# Patient Record
Sex: Male | Born: 1935
Health system: Southern US, Community
[De-identification: ages and names within clinical notes are randomized; demographics above are authoritative.]

## PROBLEM LIST (undated history)

## (undated) DIAGNOSIS — I779 Disorder of arteries and arterioles, unspecified: Secondary | ICD-10-CM

## (undated) DIAGNOSIS — I5042 Chronic combined systolic (congestive) and diastolic (congestive) heart failure: Secondary | ICD-10-CM

## (undated) DIAGNOSIS — I351 Nonrheumatic aortic (valve) insufficiency: Secondary | ICD-10-CM

## (undated) DIAGNOSIS — I499 Cardiac arrhythmia, unspecified: Secondary | ICD-10-CM

## (undated) DIAGNOSIS — Z87442 Personal history of urinary calculi: Secondary | ICD-10-CM

## (undated) DIAGNOSIS — N183 Chronic kidney disease, stage 3 unspecified: Secondary | ICD-10-CM

## (undated) DIAGNOSIS — I209 Angina pectoris, unspecified: Secondary | ICD-10-CM

## (undated) DIAGNOSIS — I10 Essential (primary) hypertension: Secondary | ICD-10-CM

## (undated) DIAGNOSIS — M1990 Unspecified osteoarthritis, unspecified site: Secondary | ICD-10-CM

## (undated) DIAGNOSIS — I2510 Atherosclerotic heart disease of native coronary artery without angina pectoris: Secondary | ICD-10-CM

## (undated) DIAGNOSIS — E785 Hyperlipidemia, unspecified: Secondary | ICD-10-CM

## (undated) DIAGNOSIS — G459 Transient cerebral ischemic attack, unspecified: Secondary | ICD-10-CM

## (undated) DIAGNOSIS — I739 Peripheral vascular disease, unspecified: Secondary | ICD-10-CM

## (undated) DIAGNOSIS — I4729 Other ventricular tachycardia: Secondary | ICD-10-CM

## (undated) DIAGNOSIS — N200 Calculus of kidney: Secondary | ICD-10-CM

## (undated) DIAGNOSIS — I428 Other cardiomyopathies: Secondary | ICD-10-CM

## (undated) DIAGNOSIS — H9190 Unspecified hearing loss, unspecified ear: Secondary | ICD-10-CM

## (undated) DIAGNOSIS — A4151 Sepsis due to Escherichia coli [E. coli]: Secondary | ICD-10-CM

## (undated) DIAGNOSIS — I69359 Hemiplegia and hemiparesis following cerebral infarction affecting unspecified side: Secondary | ICD-10-CM

## (undated) DIAGNOSIS — I671 Cerebral aneurysm, nonruptured: Secondary | ICD-10-CM

## (undated) DIAGNOSIS — R55 Syncope and collapse: Secondary | ICD-10-CM

## (undated) DIAGNOSIS — I679 Cerebrovascular disease, unspecified: Secondary | ICD-10-CM

## (undated) DIAGNOSIS — I480 Paroxysmal atrial fibrillation: Secondary | ICD-10-CM

## (undated) DIAGNOSIS — C61 Malignant neoplasm of prostate: Secondary | ICD-10-CM

## (undated) DIAGNOSIS — I69398 Other sequelae of cerebral infarction: Secondary | ICD-10-CM

## (undated) DIAGNOSIS — C801 Malignant (primary) neoplasm, unspecified: Secondary | ICD-10-CM

## (undated) DIAGNOSIS — K449 Diaphragmatic hernia without obstruction or gangrene: Secondary | ICD-10-CM

## (undated) DIAGNOSIS — J189 Pneumonia, unspecified organism: Secondary | ICD-10-CM

## (undated) DIAGNOSIS — G450 Vertebro-basilar artery syndrome: Secondary | ICD-10-CM

## (undated) DIAGNOSIS — H539 Unspecified visual disturbance: Secondary | ICD-10-CM

## (undated) DIAGNOSIS — I472 Ventricular tachycardia: Secondary | ICD-10-CM

## (undated) HISTORY — DX: Syncope and collapse: R55

## (undated) HISTORY — DX: Vertebro-basilar artery syndrome: G45.0

## (undated) HISTORY — DX: Malignant (primary) neoplasm, unspecified: C80.1

## (undated) HISTORY — DX: Unspecified hearing loss, unspecified ear: H91.90

## (undated) HISTORY — DX: Chronic combined systolic (congestive) and diastolic (congestive) heart failure: I50.42

## (undated) HISTORY — DX: Atherosclerotic heart disease of native coronary artery without angina pectoris: I25.10

## (undated) HISTORY — DX: Disorder of arteries and arterioles, unspecified: I77.9

## (undated) HISTORY — DX: Chronic kidney disease, stage 3 (moderate): N18.3

## (undated) HISTORY — DX: Cerebral aneurysm, nonruptured: I67.1

## (undated) HISTORY — PX: PROSTATE SURGERY: SHX751

## (undated) HISTORY — DX: Nonrheumatic aortic (valve) insufficiency: I35.1

## (undated) HISTORY — DX: Hyperlipidemia, unspecified: E78.5

## (undated) HISTORY — PX: CATARACT EXTRACTION: SUR2

## (undated) HISTORY — DX: Unspecified visual disturbance: H53.9

## (undated) HISTORY — DX: Other cardiomyopathies: I42.8

## (undated) HISTORY — DX: Peripheral vascular disease, unspecified: I73.9

## (undated) HISTORY — DX: Malignant neoplasm of prostate: C61

## (undated) HISTORY — DX: Chronic kidney disease, stage 3 unspecified: N18.30

## (undated) HISTORY — DX: Unspecified osteoarthritis, unspecified site: M19.90

## (undated) HISTORY — PX: TONSILLECTOMY: SUR1361

## (undated) HISTORY — DX: Cerebrovascular disease, unspecified: I67.9

## (undated) HISTORY — DX: Other ventricular tachycardia: I47.29

## (undated) HISTORY — DX: Other sequelae of cerebral infarction: I69.398

## (undated) HISTORY — DX: Hemiplegia and hemiparesis following cerebral infarction affecting unspecified side: I69.359

## (undated) HISTORY — DX: Paroxysmal atrial fibrillation: I48.0

## (undated) HISTORY — DX: Essential (primary) hypertension: I10

## (undated) HISTORY — PX: OTHER SURGICAL HISTORY: SHX169

## (undated) HISTORY — DX: Ventricular tachycardia: I47.2

---

## 1998-03-31 ENCOUNTER — Other Ambulatory Visit: Admission: RE | Admit: 1998-03-31 | Discharge: 1998-03-31 | Payer: Self-pay | Admitting: Urology

## 1998-04-24 ENCOUNTER — Encounter: Admission: RE | Admit: 1998-04-24 | Discharge: 1998-07-23 | Payer: Self-pay | Admitting: Radiation Oncology

## 1998-06-12 ENCOUNTER — Ambulatory Visit (HOSPITAL_BASED_OUTPATIENT_CLINIC_OR_DEPARTMENT_OTHER): Admission: RE | Admit: 1998-06-12 | Discharge: 1998-06-12 | Payer: Self-pay | Admitting: Urology

## 1998-06-12 ENCOUNTER — Encounter: Payer: Self-pay | Admitting: Urology

## 1998-07-03 ENCOUNTER — Encounter: Payer: Self-pay | Admitting: Radiation Oncology

## 1998-07-24 ENCOUNTER — Encounter: Admission: RE | Admit: 1998-07-24 | Discharge: 1998-10-22 | Payer: Self-pay | Admitting: Radiation Oncology

## 2002-02-19 ENCOUNTER — Encounter: Admission: RE | Admit: 2002-02-19 | Discharge: 2002-02-19 | Payer: Self-pay | Admitting: Urology

## 2002-02-19 ENCOUNTER — Encounter: Payer: Self-pay | Admitting: Urology

## 2002-03-04 ENCOUNTER — Encounter: Payer: Self-pay | Admitting: Urology

## 2002-03-04 ENCOUNTER — Encounter: Admission: RE | Admit: 2002-03-04 | Discharge: 2002-03-04 | Payer: Self-pay | Admitting: Urology

## 2003-02-25 ENCOUNTER — Ambulatory Visit (HOSPITAL_BASED_OUTPATIENT_CLINIC_OR_DEPARTMENT_OTHER): Admission: RE | Admit: 2003-02-25 | Discharge: 2003-02-25 | Payer: Self-pay | Admitting: Urology

## 2003-02-25 ENCOUNTER — Ambulatory Visit (HOSPITAL_COMMUNITY): Admission: RE | Admit: 2003-02-25 | Discharge: 2003-02-25 | Payer: Self-pay | Admitting: Urology

## 2003-11-04 ENCOUNTER — Emergency Department (HOSPITAL_COMMUNITY): Admission: EM | Admit: 2003-11-04 | Discharge: 2003-11-04 | Payer: Self-pay | Admitting: Emergency Medicine

## 2008-12-19 ENCOUNTER — Encounter (INDEPENDENT_AMBULATORY_CARE_PROVIDER_SITE_OTHER): Payer: Self-pay | Admitting: Cardiology

## 2008-12-19 ENCOUNTER — Ambulatory Visit (HOSPITAL_COMMUNITY): Admission: RE | Admit: 2008-12-19 | Discharge: 2008-12-19 | Payer: Self-pay | Admitting: Cardiology

## 2009-07-26 ENCOUNTER — Emergency Department (HOSPITAL_COMMUNITY): Admission: EM | Admit: 2009-07-26 | Discharge: 2009-07-26 | Payer: Self-pay | Admitting: Emergency Medicine

## 2010-07-07 LAB — URINALYSIS, ROUTINE W REFLEX MICROSCOPIC
Bilirubin Urine: NEGATIVE
Glucose, UA: NEGATIVE mg/dL
Protein, ur: NEGATIVE mg/dL
Urobilinogen, UA: 0.2 mg/dL (ref 0.0–1.0)
pH: 5.5 (ref 5.0–8.0)

## 2010-07-07 LAB — URINE MICROSCOPIC-ADD ON

## 2010-07-07 LAB — POCT I-STAT, CHEM 8
BUN: 26 mg/dL — ABNORMAL HIGH (ref 6–23)
Chloride: 107 mEq/L (ref 96–112)
HCT: 38 % — ABNORMAL LOW (ref 39.0–52.0)
Hemoglobin: 12.9 g/dL — ABNORMAL LOW (ref 13.0–17.0)
Sodium: 137 mEq/L (ref 135–145)
TCO2: 23 mmol/L (ref 0–100)

## 2010-08-02 ENCOUNTER — Inpatient Hospital Stay (HOSPITAL_COMMUNITY)
Admission: EM | Admit: 2010-08-02 | Discharge: 2010-08-08 | DRG: 291 | Disposition: A | Discharge status: Home / Self Care | Payer: Medicare Other | Attending: Internal Medicine | Admitting: Internal Medicine

## 2010-08-02 DIAGNOSIS — N201 Calculus of ureter: Secondary | ICD-10-CM | POA: Diagnosis present

## 2010-08-02 DIAGNOSIS — N179 Acute kidney failure, unspecified: Secondary | ICD-10-CM | POA: Diagnosis present

## 2010-08-02 DIAGNOSIS — I5023 Acute on chronic systolic (congestive) heart failure: Principal | ICD-10-CM | POA: Diagnosis present

## 2010-08-02 DIAGNOSIS — K5900 Constipation, unspecified: Secondary | ICD-10-CM | POA: Diagnosis present

## 2010-08-02 DIAGNOSIS — N210 Calculus in bladder: Secondary | ICD-10-CM | POA: Diagnosis present

## 2010-08-02 DIAGNOSIS — N1330 Unspecified hydronephrosis: Secondary | ICD-10-CM | POA: Diagnosis present

## 2010-08-02 DIAGNOSIS — N200 Calculus of kidney: Secondary | ICD-10-CM | POA: Diagnosis present

## 2010-08-02 DIAGNOSIS — I509 Heart failure, unspecified: Secondary | ICD-10-CM | POA: Diagnosis present

## 2010-08-02 DIAGNOSIS — J189 Pneumonia, unspecified organism: Secondary | ICD-10-CM | POA: Diagnosis not present

## 2010-08-02 DIAGNOSIS — Z8546 Personal history of malignant neoplasm of prostate: Secondary | ICD-10-CM

## 2010-08-02 DIAGNOSIS — D473 Essential (hemorrhagic) thrombocythemia: Secondary | ICD-10-CM | POA: Diagnosis present

## 2010-08-03 ENCOUNTER — Inpatient Hospital Stay (HOSPITAL_COMMUNITY): Payer: Medicare Other

## 2010-08-03 ENCOUNTER — Emergency Department (HOSPITAL_COMMUNITY): Payer: Medicare Other

## 2010-08-03 LAB — DIFFERENTIAL
Basophils Absolute: 0 10*3/uL (ref 0.0–0.1)
Eosinophils Absolute: 0.1 10*3/uL (ref 0.0–0.7)
Lymphocytes Relative: 32 % (ref 12–46)
Monocytes Absolute: 0.4 10*3/uL (ref 0.1–1.0)
Monocytes Relative: 10 % (ref 3–12)
Neutro Abs: 2.5 10*3/uL (ref 1.7–7.7)
Neutrophils Relative %: 57 % (ref 43–77)

## 2010-08-03 LAB — CK TOTAL AND CKMB (NOT AT ARMC)
CK, MB: 3.3 ng/mL (ref 0.3–4.0)
CK, MB: 2.7 ng/mL (ref 0.3–4.0)
Total CK: 173 U/L (ref 7–232)
Total CK: 111 U/L (ref 7–232)

## 2010-08-03 LAB — CBC
Hemoglobin: 16.2 g/dL (ref 13.0–17.0)
WBC: 4.4 10*3/uL (ref 4.0–10.5)

## 2010-08-03 LAB — BASIC METABOLIC PANEL
Calcium: 9.4 mg/dL (ref 8.4–10.5)
Creatinine, Ser: 1.39 mg/dL (ref 0.4–1.5)
GFR calc Af Amer: 59 mL/min — ABNORMAL LOW (ref >60)
Potassium: 4.2 mEq/L (ref 3.5–5.1)

## 2010-08-03 LAB — TROPONIN I
Troponin I: 0.03 ng/mL (ref 0.00–0.06)
Troponin I: 0.03 ng/mL (ref 0.00–0.06)

## 2010-08-03 LAB — CARDIAC PANEL(CRET KIN+CKTOT+MB+TROPI)
Relative Index: 2.5 (ref 0.0–2.5)
Troponin I: 0.03 ng/mL (ref 0.00–0.06)

## 2010-08-04 LAB — LIPID PANEL
HDL: 36 mg/dL — ABNORMAL LOW (ref >39)
LDL Cholesterol: 110 mg/dL — ABNORMAL HIGH (ref 0–99)
Triglycerides: 72 mg/dL (ref <150)
VLDL: 14 mg/dL (ref 0–40)

## 2010-08-04 LAB — BASIC METABOLIC PANEL
BUN: 29 mg/dL — ABNORMAL HIGH (ref 6–23)
CO2: 25 mEq/L (ref 19–32)
Calcium: 8.6 mg/dL (ref 8.4–10.5)
Chloride: 106 mEq/L (ref 96–112)
Creatinine, Ser: 2.12 mg/dL — ABNORMAL HIGH (ref 0.4–1.5)
GFR calc Af Amer: 36 mL/min — ABNORMAL LOW (ref >60)
GFR calc non Af Amer: 30 mL/min — ABNORMAL LOW (ref >60)
Glucose, Bld: 97 mg/dL (ref 70–99)
Potassium: 4.2 mEq/L (ref 3.5–5.1)

## 2010-08-04 LAB — CBC
MCHC: 34.6 g/dL (ref 30.0–36.0)
RDW: 13.1 % (ref 11.5–15.5)
WBC: 5 10*3/uL (ref 4.0–10.5)

## 2010-08-05 ENCOUNTER — Inpatient Hospital Stay (HOSPITAL_COMMUNITY): Payer: Medicare Other

## 2010-08-05 LAB — COMPREHENSIVE METABOLIC PANEL
Alkaline Phosphatase: 49 U/L (ref 39–117)
BUN: 36 mg/dL — ABNORMAL HIGH (ref 6–23)
CO2: 26 mEq/L (ref 19–32)
Creatinine, Ser: 2.74 mg/dL — ABNORMAL HIGH (ref 0.4–1.5)
GFR calc Af Amer: 27 mL/min — ABNORMAL LOW (ref >60)
GFR calc non Af Amer: 22 mL/min — ABNORMAL LOW (ref >60)
Potassium: 4.3 mEq/L (ref 3.5–5.1)
Sodium: 137 mEq/L (ref 135–145)

## 2010-08-05 LAB — URINALYSIS, ROUTINE W REFLEX MICROSCOPIC
Bilirubin Urine: NEGATIVE
Glucose, UA: NEGATIVE mg/dL
Hgb urine dipstick: NEGATIVE
Ketones, ur: NEGATIVE mg/dL
Nitrite: NEGATIVE
Protein, ur: NEGATIVE mg/dL
Urobilinogen, UA: 0.2 mg/dL (ref 0.0–1.0)
pH: 5.5 (ref 5.0–8.0)

## 2010-08-05 LAB — CBC: Platelets: 108 10*3/uL — ABNORMAL LOW (ref 150–400)

## 2010-08-05 LAB — BRAIN NATRIURETIC PEPTIDE: Pro B Natriuretic peptide (BNP): 142 pg/mL — ABNORMAL HIGH (ref 0.0–100.0)

## 2010-08-05 LAB — SURGICAL PCR SCREEN: Staphylococcus aureus: NEGATIVE

## 2010-08-05 NOTE — Consult Note (Signed)
NAME:  Richard Reed, Richard Reed NO.:  [id number]  MEDICAL RECORD NO.:  [id number]           PATIENT TYPE:  I  LOCATION:  1410                         FACILITY:  [facility]  PHYSICIAN:  Heloise Purpura, MD      DATE OF BIRTH:  1926-01-25  DATE OF CONSULTATION:  08/04/2010 DATE OF DISCHARGE:                                CONSULTATION   REASON FOR CONSULTATION:  Right ureteral stone and acute kidney injury.  PHYSICIAN REQUESTING CONSULTATION:  Kathlen Mody, MD  HISTORY:  Richard Reed is an 77 year old gentleman with a history of prostate cancer and urolithiasis followed by Dr. Vonita Moss and previously by Dr. Earlene Plater.  He was most recently seen by Dr. Vonita Moss in January.  He presented to the hospital with acute shortness of breath and was felt to have an acute exacerbation of his congestive heart failure.  While hospitalized yesterday, he began developing severe right-sided flank pain consistent with his history of kidney stones.  Therefore, he underwent a CT scan which did reveal 4-mm distal right ureteral calculus with associated hydronephrosis.  He was also noted to have bilateral nonobstructing renal calculi.  He has had minimal nausea and vomiting and denies any fever or hematuria.  His pain has been relatively well controlled with IV morphine.  He is currently undergoing diuresis for treatment of his congestive heart failure.  In addition, on admission, his serum creatinine was 1.39 but was noted increase today to 2.12.  PAST MEDICAL HISTORY: 1. History of urolithiasis. 2. History of prostate cancer. 3. Congestive heart failure.  PAST SURGICAL HISTORY:  History radiation seed implantation for treatment of prostate cancer.  CURRENT MEDICATIONS: 1. Aspirin. 2. Lovenox. 3. Lasix. 4. Lopressor. 5. Avelox. 6. Tylenol. 7. Morphine p.r.n.  ALLERGIES:  No known drug allergies.  FAMILY HISTORY:  No history of GU malignancy.  SOCIAL HISTORY:  He is married.  He denies  alcohol or tobacco use.  REVIEW OF SYSTEMS:  A complete review of systems was reviewed.  All systems are negative except as in the history of present illness.  PHYSICAL EXAMINATION:  VITAL SIGNS:  Temperature 97.2, pulse 90, blood pressure 111/69, respirations 18. CONSTITUTIONAL:  Well-nourished, well-developed age-appropriate male, in no acute distress. HEENT:  Normocephalic, atraumatic. NECK:  Supple without lymphadenopathy. CARDIOVASCULAR:  Regular rate and rhythm. LUNGS:  Normal respiratory effort. ABDOMEN:  He does have mild right CVA tenderness without any abdominal tenderness or masses. EXTREMITIES:  No edema. NEUROLOGIC:  Grossly intact.  LABORATORY DATA:  Serum creatinine 2.12, increased from 1.39 yesterday. White blood count 5.0, hemoglobin 15.1, BNP 289.  Troponin levels have not been elevated.  RADIOLOGY IMAGING:  I independently reviewed his CT scan from August 03, 2010, with findings as dictated above.  IMPRESSION:  History of urolithiasis with current right ureteral calculus.  RECOMMENDATIONS:  He will be started on medical expulsion therapy with tamsulosin and will strain his urine.  Currently, his pain is well controlled and there is no acute indication to proceed with surgical intervention.  However, his renal function will need to be monitored. His acute kidney injury may be related to a  prerenal cause such as congestive heart failure, although considering that he does have some parenchymal thinning bilaterally of his kidneys, there is the possibility that his renal function may be slightly impaired related to unilateral obstruction.  His creatinine should be followed and if it increases, he may require surgical intervention at that time.  I will notify Dr. Vonita Moss of the patient's situation so that he can continue to follow him during this hospitalization.     Heloise Purpura, MD     LB/MEDQ  D:  08/04/2010  T:  08/05/2010  Job:  [id number]  cc:    Maretta Bees. Vonita Moss, M.D. Fax: [phone]  Kathlen Mody, MD  Electronically Signed by Heloise Purpura MD on 08/05/2010 06:07:24 AM

## 2010-08-06 LAB — BASIC METABOLIC PANEL
CO2: 27 mEq/L (ref 19–32)
Calcium: 8.9 mg/dL (ref 8.4–10.5)
Creatinine, Ser: 2.05 mg/dL — ABNORMAL HIGH (ref 0.4–1.5)
GFR calc Af Amer: 38 mL/min — ABNORMAL LOW (ref >60)
Sodium: 139 mEq/L (ref 135–145)

## 2010-08-06 LAB — URINE CULTURE
Culture: NO GROWTH
Special Requests: NEGATIVE

## 2010-08-06 LAB — CBC
Hemoglobin: 15.2 g/dL (ref 13.0–17.0)
MCHC: 35.1 g/dL (ref 30.0–36.0)
Platelets: 109 10*3/uL — ABNORMAL LOW (ref 150–400)
RBC: 4.72 MIL/uL (ref 4.22–5.81)

## 2010-08-06 LAB — MAGNESIUM: Magnesium: 2.1 mg/dL (ref 1.5–2.5)

## 2010-08-07 LAB — BASIC METABOLIC PANEL
BUN: 31 mg/dL — ABNORMAL HIGH (ref 6–23)
CO2: 26 mEq/L (ref 19–32)
Calcium: 8.8 mg/dL (ref 8.4–10.5)
Chloride: 105 mEq/L (ref 96–112)
Creatinine, Ser: 1.6 mg/dL — ABNORMAL HIGH (ref 0.4–1.5)
GFR calc Af Amer: 50 mL/min — ABNORMAL LOW (ref >60)
Glucose, Bld: 109 mg/dL — ABNORMAL HIGH (ref 70–99)

## 2010-08-07 LAB — CBC
HCT: 41.3 % (ref 39.0–52.0)
Hemoglobin: 14.4 g/dL (ref 13.0–17.0)
MCH: 31.9 pg (ref 26.0–34.0)
MCHC: 34.9 g/dL (ref 30.0–36.0)
MCV: 91.6 fL (ref 78.0–100.0)
RBC: 4.51 MIL/uL (ref 4.22–5.81)

## 2010-08-08 LAB — BASIC METABOLIC PANEL
BUN: 26 mg/dL — ABNORMAL HIGH (ref 6–23)
CO2: 28 mEq/L (ref 19–32)
Calcium: 8.7 mg/dL (ref 8.4–10.5)
Creatinine, Ser: 1.54 mg/dL — ABNORMAL HIGH (ref 0.4–1.5)
GFR calc non Af Amer: 43 mL/min — ABNORMAL LOW (ref >60)
Glucose, Bld: 105 mg/dL — ABNORMAL HIGH (ref 70–99)
Sodium: 141 mEq/L (ref 135–145)

## 2010-08-14 NOTE — Op Note (Signed)
NAME:  BIRUK, TROIA NO.:  [id number]  MEDICAL RECORD NO.:  [id number]           PATIENT TYPE:  I  LOCATION:  1410                         FACILITY:  [facility]  PHYSICIAN:  Danae Chen, M.D.  DATE OF BIRTH:  1925/08/24  DATE OF PROCEDURE:  08/05/2010 DATE OF DISCHARGE:                              OPERATIVE REPORT   PREOPERATIVE DIAGNOSES:  Right distal ureteral calculus, bilateral renal calculi, renal insufficiency.  POSTOPERATIVE DIAGNOSES:  Right distal ureteral calculus, bilateral renal calculi, renal insufficiency.  PROCEDURE DONE:  Cystoscopy, removal of bladder calculus, bilateral retrograde pyelogram, ureteroscopy right side, and insertion of bilateral double-J catheter.  SURGEON:  Danae Chen, MD  ANESTHESIA:  General.  INDICATION:  The patient is an 77 year old male patient of Dr. Enos Fling who was seen in consultation yesterday for right flank pain.  CT scan showed a 4 mm right distal ureteral calculus with hydronephrosis. He also has bilateral nonobstructing renal calculi.  His creatinine has gone up from 1.39 to 2.74 today.  He has a history of prostate cancer and was treated with brachytherapy in the past.  He is scheduled today for cystoscopy, right ureteral stone manipulation, bilateral retrograde pyelogram, and insertion of double-J stent.  The patient was identified by his wristband and proper time-out was taken.  Under general anesthesia, he was prepped and draped and placed in the dorsolithotomy position.  A panendoscope was inserted in the bladder. The anterior urethra was normal.  He had moderate prostatic hypertrophy and the bladder neck was open.  The bladder was heavily trabeculated with cellules and diverticula.  There was a 4 mm stone in the bladder. There was no tumor in the bladder nor in the diverticula.  The bladder was irrigated with normal saline with the Toomey syringe and the bladder stone was extracted.  Right  retrograde pyelogram.  A cone-tip catheter was passed through the cystoscope and through the right ureteral orifice.  Contrast was then injected through the cone-tip catheter.  The ureter appeared normal. There was no filling defect in the ureter.  The renal pelvis and calices were moderately dilated.  The cone-tip catheter was then removed.  A sensor wire was passed through the cystoscope and in the right ureter. Then, the cystoscope was removed.  A semi-rigid ureteroscope was then passed in the bladder and through the right ureteral orifice and advanced without difficulty all the way up into the upper ureter.  There was no stone or tumor in the ureter.  The ureteroscope was then removed. The cystoscope was then re-inserted in the bladder and a #6-French - 26 double-J stent was passed over the guidewire.  The proximal curl of the double-J stent was in the renal pelvis, the distal curl was in the bladder.  The guidewire was then removed.  Left retrograde pyelogram.  A cone-tip catheter was passed through the cystoscope and through the left ureteral orifice.  Contrast was then injected through the cone-tip catheter.  The ureter was normal.  The renal pelvis and calices appeared normal.  The patient was known to have bilateral renal calculi.  The cone-tip catheter was  then removed.  A sensor wire was then passed through the cystoscope and through the left ureter and a #6-French - 26 double-J stent was passed over the guidewire with the proximal and distal curls of the double-J stent respectively in the renal pelvis and in the bladder.  The guidewire was removed.  The bladder was emptied and the cystoscope removed.  The patient tolerated the procedure well and left the OR in satisfactory condition to post-anesthesia care unit.     Danae Chen, M.D.     MN/MEDQ  D:  08/05/2010  T:  08/06/2010  Job:  [id number]  Electronically Signed by Lindaann Slough M.D. on 08/14/2010 11:09:20 AM

## 2010-08-17 NOTE — H&P (Signed)
NAME:  Richard Reed, Richard Reed NO.:  [id number]  MEDICAL RECORD NO.:  [id number]           PATIENT TYPE:  E  LOCATION:  WLED                         FACILITY:  [facility]  PHYSICIAN:  Houston Siren, MD           DATE OF BIRTH:  [birth year]  DATE OF ADMISSION:  08/02/2010 DATE OF DISCHARGE:                             HISTORY & PHYSICAL   PRIMARY CARE PHYSICIAN:  [facility].  UROLOGIST:  Lucrezia Starch. Earlene Plater, MD.  CARDIOLOGIST:  Nanetta Batty, MD.  ADVANCE DIRECTIVE:  Full code.  REASON FOR ADMISSION:  Shortness of breath, question of congestive heart failure.  HISTORY OF PRESENT ILLNESS:  This is an 77 year old male with history of heart murmur, kidney stone, prostate cancer but generally healthy, on no chronic medication, presents with at least 1-2 weeks of progressive shortness of breath, increased dyspnea on exertion and orthopnea.  He denied any frank PND.  He has not had any chest pain, cough, fever or chills.  Denies any abdominal pain.  He has had no pedal edema, calf swelling or tenderness.  He had no distant travel.  Workup included a chest x-ray, which showed question of bilateral airspace disease.  His BNP is in the mid 200s.  He also has a normal white count and no fever. His troponin is 0.03 and EKG showed bigeminy without any acute ST-T changes at rate of 84.  His cardiac markers were negative as well. Hospitalist was asked to admit the patient because of shortness of breath.  He was given intravenous Lasix and felt markedly better in the emergency room.  PAST MEDICAL HISTORY: 1. Heart murmur. 2. Kidney stone. 3. Prostate cancer.  SOCIAL HISTORY:  He is married.  Denies alcohol, tobacco or drug use.  FAMILY HISTORY:  Noncontributory.  REVIEW OF SYSTEMS:  Otherwise unremarkable.  ALLERGIES:  No known drug allergies.  PHYSICAL EXAMINATION:  VITAL SIGNS:  Blood pressure 151/75, heart rate of 84, respiratory rate 18, temperature 97.3. GENERAL:   He is alert and oriented and is in no apparent distress.  He has no dyspnea and actually felt very comfortable.  He was sitting up eating crackers and butter. NECK:  Supple.  Throat is clear.  There is no JVD or stridor.  No sinus tenderness. CARDIAC:  S1 and S2 with a 2/6 systolic ejection murmur at the left sternal border. LUNGS:  No wheezes, but there are definitely bilateral crackles at both bases. ABDOMEN:  Soft, nondistended, nontender. EXTREMITIES:  No edema.  No calf tenderness.  Good distal pulses bilaterally. SKIN:  Warm and dry. NEUROLOGIC:  Unremarkable. PSYCHIATRIC:  Unremarkable.  OBJECTIVE FINDINGS:  EKG:  Bigeminy, rate of 84, axis -30 degrees, no acute ST-T changes.  Chest x-ray showed bilateral airspace disease. White count of 4.4000, hemoglobin 16.2, platelet count of 133,000, potassium 4.2, creatinine 1.4, glucose of 90 and troponin 0.03.  IMPRESSION/PLAN:  This is an 77 year old male with symptoms of orthopnea, PND and elevated BNP to 200s, suggestive of congestive heart failure.  I suspect that he has diastolic heart failure.  His chest x- ray did show  bilateral infiltrates, but he has no fever, no white count, no cough, and I do not really think he has pneumonia.  Avelox was started in the emergency room.  We will continue this medication since he has abnormal chest x-ray reading suggestive of air space disease.  I suspect his main problem as noted is diastolic heart failure.  He improved with IV Lasix, and we will give him IV Lasix 20 mg b.i.d..  We will rule out with serial CPKs and troponins.  We will get an echo of his heart both to evaluate ejection fraction and any valvular problem.  For his bigeminy, we will start him on low-dose beta blocker.  He did have a burst of PSVT as well and beta blocker would help also.  I will add an aspirin a day and if his creatinine improves, consider ACE inhibitor as well.  He is a full code, will be admitted to [facility].  His clinical symptoms with improvement on treatment of diuretics argued against a pulmonary embolism.     Houston Siren, MD     PL/MEDQ  D:  08/03/2010  T:  08/03/2010  Job:  [id number]  cc:   [facility]  Electronically Signed by Houston Siren  on 08/17/2010 06:23:55 AM

## 2010-09-03 NOTE — Op Note (Signed)
**Note Richard-Identified via Obfuscation** NAME:  DEMORRIS, Richard Reed NO.:  [id number]   MEDICAL RECORD NO.:  [id number]                   PATIENT TYPE:  AMB   LOCATION:  [facility]                                 FACILITY:  [facility]   PHYSICIAN:  Ronald L. Ovidio Hanger, M.D.           DATE OF BIRTH:  31-Mar-1926   DATE OF PROCEDURE:  02/25/2003  DATE OF DISCHARGE:                                 OPERATIVE REPORT   DIAGNOSES:  1. Left ureterolithiasis.  2. Hydronephrosis.   PROCEDURE:  1. Cystourethroscopy.  2. Left ureteroscopy.  3. Holmium laser lithotripsy.  4. Left ureteral calculus.  5. Basket extraction of fragments.  6. Placement of left double-J stent.   SURGEON:  Lucrezia Starch. Earlene Plater, M.D.   ANESTHESIA:  LMA.   ESTIMATED BLOOD LOSS:  Negligible.   TUBES:  26 cm, 5-French Bard double-pigtail stent.   COMPLICATIONS:  None.   INDICATIONS FOR PROCEDURE:  [name] is a very nice, 77 year old, white  male with a history of prostate cancer, treated with seed implantation.  He  presented with intermittent left flank pain which became severe and on a CT  urogram without contrast yesterday, was found to have a large left lower  ureteral calculus with hydroureteronephrosis.  After understanding risks,  benefits, and alternatives, he has elected to proceed with the above  procedure.   DESCRIPTION OF PROCEDURE:  The patient was placed in a supine position.  After proper LMA anesthesia, was placed in the dorsal lithotomy position and  prepped and draped with Betadine in a sterile fashion.  A 0.038-French  Teflon-coated guidewire was placed into the left ureteral orifice after the  bladder had been carefully inspected with a 22.5-French Olympus  panendoscope, utilizing the 12 and 70 degree lenses.  No significant  abnormalities were noted. The stone could be seen on fluoroscopy.   The left ureter meatus was noted to be quite small; there, it was balloon  dilated with a 4 cm, 5 mm ureteral balloon  dilator at 10 atmospheres of  pressure x2 minutes.  Following this, there was no waisting noted on the  balloon, and ureteroscopy was performed with any Olympus short thin  semirigid ureteroscope.  The stone was visualized, and it actually was in  two fragments that were somewhat impacted into the wall of ureter.  It was  approximately 4 cm above the left ureteral orifice, and the ureters appeared  to be significantly dilated proximal to it.  Utilizing the 365 Holmium laser  fiber on a setting of 0.5 __________,  repetition x5, the stone was  fragmented into multiple fragments, and multiple uretroscopies were  performed with a nitinol basket, and fragments were extracted serially.  The  lower two-thirds of the ureter were then visualized.  There were no  significant fragments remaining.  There were no perforations noted.   The ureteroscope was visually removed and under fluoroscopic guidance, a 26  cm,  6-French Cook double-pigtail stent was placed and noted to be in good  position within the left renal pelvis and the bladder.  A pull-out string  was attached; bladder was drained and all scopes removed, and the patient  was taken to the recovery room stable.  Again, the system drained; the  bladder was drained.  The stone will be submitted to pathology for stone  analysis, and the panendoscope was visually removed.   The patient was taken to the recovery room stable.                                               Ronald L. Ovidio Hanger, M.D.    RLD/MEDQ  D:  02/25/2003  T:  02/25/2003  Job:  [id number]

## 2010-09-17 HISTORY — PX: CARDIAC CATHETERIZATION: SHX172

## 2010-10-07 ENCOUNTER — Ambulatory Visit (HOSPITAL_COMMUNITY)
Admission: RE | Admit: 2010-10-07 | Discharge: 2010-10-07 | Disposition: A | Discharge status: Home / Self Care | Payer: Medicare Other | Source: Ambulatory Visit | Attending: Cardiovascular Disease | Admitting: Cardiovascular Disease

## 2010-10-07 DIAGNOSIS — E785 Hyperlipidemia, unspecified: Secondary | ICD-10-CM | POA: Insufficient documentation

## 2010-10-07 DIAGNOSIS — Z7982 Long term (current) use of aspirin: Secondary | ICD-10-CM | POA: Insufficient documentation

## 2010-10-07 DIAGNOSIS — Z79899 Other long term (current) drug therapy: Secondary | ICD-10-CM | POA: Insufficient documentation

## 2010-10-07 DIAGNOSIS — I10 Essential (primary) hypertension: Secondary | ICD-10-CM | POA: Insufficient documentation

## 2010-10-07 DIAGNOSIS — R079 Chest pain, unspecified: Secondary | ICD-10-CM | POA: Insufficient documentation

## 2010-10-07 DIAGNOSIS — I359 Nonrheumatic aortic valve disorder, unspecified: Secondary | ICD-10-CM | POA: Insufficient documentation

## 2010-10-12 NOTE — Procedures (Signed)
  NAME:  [name], [name] NO.:  [id number]  MEDICAL RECORD NO.:  [id number]  LOCATION:  [facility]                         FACILITY:  [facility]  PHYSICIAN:  Nanetta Batty, M.D.   DATE OF BIRTH:  1925/07/14  DATE OF PROCEDURE: DATE OF DISCHARGE:                   PERIPHERAL VASCULAR INVASIVE PROCEDURE   Richard Reed is a delightful 77 year old married Caucasian male, father of 2 children, grandfather of grandchildren, history of hypertension, hyperlipidemia, and moderate aortic insufficiency with moderate LV dysfunction.  He was recently in [place], and was seen by cardiologist because of chest pain.  An echo showed moderate to severe AI.  Myoview was negative.  He had recurrent chest pain when he came from the [place], because of stage risk factors and recurrent chest pain.  He presents now for right and left heart cath to define his anatomy to rule out ischemic etiology and his physiology.  PROCEDURE DESCRIPTION:  The patient brought to the second floor [facility] cardiac cath lab in the post absorptive state.  He was premedicated with p.o. Valium and his right groin was prepped and shaved in the usual sterile fashion.  A 1% Xylocaine was used for local anesthesia.  A 5- French sheath was inserted into the right femoral artery using standard Seldinger technique.  A 7-French sheath was inserted to the right femoral vein.  A 7-French balloon-tip thermodilution, Swan-Ganz catheter was used to obtain sequential right heart pressures.  A 5-French right and left Judkins diagnostic catheter as well a 5-French pigtail catheter were used for selective coronary angiography and obtaining LVEDP. Visipaque dye was used for the entirety of the case.  Retrograde aortic, left ventricular, and pullback pressures were recorded.  HEMODYNAMICS: 1. Aortic systolic pressure 148, diastolic pressure is 70. 2. Left ventricle systolic pressure 148 and diastolic pressure 10. 3. RA pressure  A-wave 11, V-wave 8, mean 7. 4. RV pressure systolic 37 and end-diastolic pressure 10. 5. Pulmonary artery pressure systolic 28, diastolic pressure 12, mean     19. 6. Pulmonary capillary wedge pressure A-wave 12, V-wave 10, mean 9.  SELECTIVE CORONARY ANGIOGRAPHY: 1. Left main normal. 2. LAD normal. 3. Left circumflex normal. 4. Right coronary artery was dominant with 46% ostial stenosis, but no     damping.  IMPRESSION:  Richard Reed has essentially normal coronaries arteries, normal LV function.  Total 50 mL of contrast was used.  He will be discharged home later today as an outpatient and will see him back in the office in 1-2 weeks for followup.  Continued medical therapy of aortic insufficiency will be recommended.  He left lab in stable condition.     Nanetta Batty, M.D.     Cordelia Pen  D:  10/07/2010  T:  10/08/2010  Job:  [id number]  cc:   Cardiac Cath Lab Second floor [facility]  Electronically Signed by Nanetta Batty M.D. on 10/12/2010 09:24:00 AM

## 2011-03-23 ENCOUNTER — Other Ambulatory Visit: Payer: Self-pay

## 2011-03-23 ENCOUNTER — Encounter: Payer: Self-pay | Admitting: Emergency Medicine

## 2011-03-23 ENCOUNTER — Emergency Department (HOSPITAL_COMMUNITY): Payer: Medicare Other

## 2011-03-23 ENCOUNTER — Inpatient Hospital Stay (HOSPITAL_COMMUNITY)
Admission: EM | Admit: 2011-03-23 | Discharge: 2011-03-31 | DRG: 871 | Disposition: A | Discharge status: Home / Self Care | Payer: Medicare Other | Attending: Family Medicine | Admitting: Family Medicine

## 2011-03-23 DIAGNOSIS — R197 Diarrhea, unspecified: Secondary | ICD-10-CM

## 2011-03-23 DIAGNOSIS — E872 Acidosis, unspecified: Secondary | ICD-10-CM | POA: Diagnosis present

## 2011-03-23 DIAGNOSIS — A088 Other specified intestinal infections: Secondary | ICD-10-CM | POA: Diagnosis present

## 2011-03-23 DIAGNOSIS — R6520 Severe sepsis without septic shock: Secondary | ICD-10-CM | POA: Diagnosis present

## 2011-03-23 DIAGNOSIS — K529 Noninfective gastroenteritis and colitis, unspecified: Secondary | ICD-10-CM | POA: Diagnosis present

## 2011-03-23 DIAGNOSIS — R339 Retention of urine, unspecified: Secondary | ICD-10-CM

## 2011-03-23 DIAGNOSIS — A4151 Sepsis due to Escherichia coli [E. coli]: Principal | ICD-10-CM | POA: Diagnosis present

## 2011-03-23 DIAGNOSIS — J9600 Acute respiratory failure, unspecified whether with hypoxia or hypercapnia: Secondary | ICD-10-CM | POA: Diagnosis not present

## 2011-03-23 DIAGNOSIS — N183 Chronic kidney disease, stage 3 unspecified: Secondary | ICD-10-CM | POA: Diagnosis present

## 2011-03-23 DIAGNOSIS — G9340 Encephalopathy, unspecified: Secondary | ICD-10-CM | POA: Diagnosis not present

## 2011-03-23 DIAGNOSIS — N189 Chronic kidney disease, unspecified: Secondary | ICD-10-CM | POA: Diagnosis present

## 2011-03-23 DIAGNOSIS — J811 Chronic pulmonary edema: Secondary | ICD-10-CM | POA: Diagnosis not present

## 2011-03-23 DIAGNOSIS — G9341 Metabolic encephalopathy: Secondary | ICD-10-CM | POA: Diagnosis not present

## 2011-03-23 DIAGNOSIS — Z8546 Personal history of malignant neoplasm of prostate: Secondary | ICD-10-CM

## 2011-03-23 DIAGNOSIS — R531 Weakness: Secondary | ICD-10-CM

## 2011-03-23 DIAGNOSIS — D696 Thrombocytopenia, unspecified: Secondary | ICD-10-CM | POA: Diagnosis present

## 2011-03-23 DIAGNOSIS — R000 Tachycardia, unspecified: Secondary | ICD-10-CM | POA: Diagnosis present

## 2011-03-23 DIAGNOSIS — A419 Sepsis, unspecified organism: Secondary | ICD-10-CM | POA: Diagnosis present

## 2011-03-23 DIAGNOSIS — E43 Unspecified severe protein-calorie malnutrition: Secondary | ICD-10-CM | POA: Diagnosis not present

## 2011-03-23 DIAGNOSIS — N2889 Other specified disorders of kidney and ureter: Secondary | ICD-10-CM | POA: Diagnosis present

## 2011-03-23 DIAGNOSIS — H9190 Unspecified hearing loss, unspecified ear: Secondary | ICD-10-CM | POA: Diagnosis present

## 2011-03-23 DIAGNOSIS — J189 Pneumonia, unspecified organism: Secondary | ICD-10-CM

## 2011-03-23 DIAGNOSIS — N179 Acute kidney failure, unspecified: Secondary | ICD-10-CM | POA: Diagnosis present

## 2011-03-23 DIAGNOSIS — R1110 Vomiting, unspecified: Secondary | ICD-10-CM

## 2011-03-23 DIAGNOSIS — E860 Dehydration: Secondary | ICD-10-CM | POA: Diagnosis present

## 2011-03-23 DIAGNOSIS — I4891 Unspecified atrial fibrillation: Secondary | ICD-10-CM | POA: Diagnosis present

## 2011-03-23 DIAGNOSIS — I959 Hypotension, unspecified: Secondary | ICD-10-CM | POA: Diagnosis present

## 2011-03-23 HISTORY — DX: Calculus of kidney: N20.0

## 2011-03-23 HISTORY — DX: Diaphragmatic hernia without obstruction or gangrene: K44.9

## 2011-03-23 HISTORY — DX: Pneumonia, unspecified organism: J18.9

## 2011-03-23 HISTORY — DX: Unspecified hearing loss, unspecified ear: H91.90

## 2011-03-23 LAB — DIFFERENTIAL
Basophils Relative: 0 % (ref 0–1)
Eosinophils Absolute: 0 10*3/uL (ref 0.0–0.7)
Lymphocytes Relative: 2 % — ABNORMAL LOW (ref 12–46)
Lymphs Abs: 0.2 10*3/uL — ABNORMAL LOW (ref 0.7–4.0)
Monocytes Relative: 3 % (ref 3–12)
Neutro Abs: 11.1 10*3/uL — ABNORMAL HIGH (ref 1.7–7.7)

## 2011-03-23 LAB — CBC
HCT: 42.9 % (ref 39.0–52.0)
Hemoglobin: 15.2 g/dL (ref 13.0–17.0)
MCHC: 35.4 g/dL (ref 30.0–36.0)
MCV: 92.9 fL (ref 78.0–100.0)
RDW: 13.7 % (ref 11.5–15.5)

## 2011-03-23 LAB — APTT: aPTT: 36 seconds (ref 24–37)

## 2011-03-23 LAB — POCT I-STAT, CHEM 8
BUN: 51 mg/dL — ABNORMAL HIGH (ref 6–23)
Calcium, Ion: 1.05 mmol/L — ABNORMAL LOW (ref 1.12–1.32)
Creatinine, Ser: 4.1 mg/dL — ABNORMAL HIGH (ref 0.50–1.35)
Glucose, Bld: 91 mg/dL (ref 70–99)
TCO2: 18 mmol/L (ref 0–100)

## 2011-03-23 LAB — COMPREHENSIVE METABOLIC PANEL
Alkaline Phosphatase: 66 U/L (ref 39–117)
BUN: 51 mg/dL — ABNORMAL HIGH (ref 6–23)
Chloride: 106 mEq/L (ref 96–112)
Creatinine, Ser: 3.59 mg/dL — ABNORMAL HIGH (ref 0.50–1.35)
GFR calc Af Amer: 17 mL/min — ABNORMAL LOW (ref >90)
Glucose, Bld: 89 mg/dL (ref 70–99)
Potassium: 3.9 mEq/L (ref 3.5–5.1)
Total Bilirubin: 2.2 mg/dL — ABNORMAL HIGH (ref 0.3–1.2)

## 2011-03-23 LAB — CK TOTAL AND CKMB (NOT AT ARMC)
CK, MB: 3.4 ng/mL (ref 0.3–4.0)
Total CK: 125 U/L (ref 7–232)

## 2011-03-23 MED ORDER — ONDANSETRON HCL 4 MG PO TABS: 4.0000 mg | ORAL_TABLET | Freq: Four times a day (QID) | ORAL | Status: DC | PRN

## 2011-03-23 MED ORDER — SODIUM CHLORIDE 0.9 % IV BOLUS (SEPSIS)
1000.0000 mL | Freq: Once | INTRAVENOUS | Status: AC
  Administered 2011-03-23: 1000 mL via INTRAVENOUS

## 2011-03-23 MED ORDER — ACETAMINOPHEN 325 MG PO TABS
650.0000 mg | ORAL_TABLET | Freq: Four times a day (QID) | ORAL | Status: DC | PRN
  Administered 2011-03-28: 650 mg via ORAL
  Filled 2011-03-23: qty 2

## 2011-03-23 MED ORDER — ONDANSETRON HCL 4 MG/2ML IJ SOLN: 4.0000 mg | Freq: Four times a day (QID) | INTRAMUSCULAR | Status: DC | PRN

## 2011-03-23 MED ORDER — SODIUM CHLORIDE 0.9 % IV SOLN: INTRAVENOUS | Status: DC

## 2011-03-23 MED ORDER — ACETAMINOPHEN 325 MG PO TABS
ORAL_TABLET | ORAL | Status: AC
  Filled 2011-03-23: qty 2

## 2011-03-23 MED ORDER — ACETAMINOPHEN 650 MG RE SUPP: 650.0000 mg | Freq: Four times a day (QID) | RECTAL | Status: DC | PRN

## 2011-03-23 MED ORDER — CEFTRIAXONE SODIUM 1 G IJ SOLR
1.0000 g | Freq: Every day | INTRAMUSCULAR | Status: DC
  Administered 2011-03-25: 1 g via INTRAVENOUS
  Filled 2011-03-23: qty 10

## 2011-03-23 MED ORDER — VANCOMYCIN HCL 1000 MG IV SOLR
1500.0000 mg | Freq: Once | INTRAVENOUS | Status: AC
  Administered 2011-03-24: 1500 mg via INTRAVENOUS
  Filled 2011-03-23: qty 1500

## 2011-03-23 MED ORDER — ACETAMINOPHEN 325 MG PO TABS
650.0000 mg | ORAL_TABLET | Freq: Once | ORAL | Status: AC
  Administered 2011-03-23: 650 mg via ORAL

## 2011-03-23 MED ORDER — SODIUM CHLORIDE 0.9 % IV SOLN
INTRAVENOUS | Status: DC
  Administered 2011-03-23: 1000 mL via INTRAVENOUS

## 2011-03-23 MED ORDER — HEPARIN SODIUM (PORCINE) 5000 UNIT/ML IJ SOLN
5000.0000 [IU] | Freq: Three times a day (TID) | INTRAMUSCULAR | Status: DC
  Administered 2011-03-23: 5000 [IU] via SUBCUTANEOUS
  Filled 2011-03-23 (×5): qty 1

## 2011-03-23 MED ORDER — SODIUM CHLORIDE 0.9 % IV BOLUS (SEPSIS): 1000.0000 mL | Freq: Once | INTRAVENOUS | Status: DC

## 2011-03-23 NOTE — ED Notes (Signed)
Ct scan done and code stroke cancelled at 1556

## 2011-03-23 NOTE — ED Notes (Signed)
NIH stroke scale done by oncoming RN before admission to floor.

## 2011-03-23 NOTE — H&P (Signed)
Family Medicine Teaching [facility] Admission History and Physical  Patient name: Richard Reed Medical record number: [id number] Date of birth: August 07, 1925 Age: 77 y.o. Gender: male  Primary Care Provider: Lucilla Edin, MD  Chief Complaint: Weakness, vomiting, and diarrhea History of Present Illness: Richard Reed is a 77 y.o. year old male presenting with one-day history of severe vomiting and diarrhea and weakness. She reports going to dinner yesterday around 5 PM and having oyster soup. At 8 PM that night patient began having mild episodes of vomiting and diarrhea. No hematemesis hematochezia was noted. After several prolonged episodes patient went to bed and reports sleeping well but felt very weak and with chills. On the morning patient awoke ate some dry cereal, banana, and some ginger ale and then had another bout of emesis. Patient back to bed and has had nothing else by mouth. Family worried about patient's call PCP who recommended taking patient to emergency room.  ED course: Patient received three 1 L boluses of normal saline in the ED. Patient still noted to be tachycardic and hypotensive. Patient noted to have elevated liver enzymes and creatinine. Head CT normal, CXR noted to have pulmonary edema  Review Of Systems: Per HPI with the following additions:  Negative: Chest pain, rash, fevers, shortness of breath, syncope, hematochezia, hematemesis Positive: Chills, vomiting, diarrhea Otherwise 12 point review of systems was performed and was unremarkable.  There is no problem list on file for this patient.  Past Medical History: Past Medical History  Diagnosis Date  . Irregular heart rate   . Kidney stones   . Hard of hearing   . CKD (chronic kidney disease)   . HTN (hypertension)   . Hiatal hernia     Past Surgical History: History reviewed. No pertinent past surgical history.  Social History: History   Social History  . Marital Status: Married    Spouse Name:  N/A    Number of Children: N/A  . Years of Education: N/A   Social History Main Topics  . Smoking status: Never Smoker   . Smokeless tobacco: Never Used  . Alcohol Use: No  . Drug Use: No  . Sexually Active: None   Other Topics Concern  . None   Social History Narrative   Medical Decision Maker:  RAFFERTY POSTLEWAIT (wife) Cell. [phone] House: [phone].Daughter: Kandis Mannan: Cell [phone] Code    Family History: Family History  Problem Relation Age of Onset  . Heart failure Mother     Allergies: No Known Allergies  Current Outpatient Prescriptions  Medication Sig Dispense Refill  . metoprolol succinate (TOPROL-XL) 25 MG 24 hr tablet Take 25 mg by mouth daily.         CXR: Recurrent diffuse bilateral pulmonary opacities most consistent with edema. Calcified pleural plaque on the left.  Head CT: 1. No acute intracranial abnormality. 2. Cerebral atrophy and small vessel ischemic change.     Physical Exam: Filed Vitals:   03/23/11 2015  BP: 137/57  Pulse: 120  Temp:   Resp: 16   General: alert, cooperative and mild distress HEENT: PERRLA, extra ocular movement intact, sclera clear, anicteric, neck supple with midline trachea, thyroid without masses and R TM perforated. Hearing Aids Heart: Irregularly irregular, II/VI diastolic murmur Lungs: clear to auscultation, no wheezes or rales and shallow breathing Abdomen: Normal active bowel sounds abdomen is soft without significant tenderness, masses, organomegaly or guarding Extremities: extremities normal, atraumatic, no cyanosis or edema Musculoskeletal: no joint tenderness, deformity or  swelling Skin:no rashes, no ecchymoses, no petechiae, no nodules, no jaundice, no purpura, no wounds Neurology: normal without focal findings, mental status, speech normal, alert and oriented x3 and PERLA  Labs and Imaging: Lab Results  Component Value Date/Time   NA 140 03/23/2011  4:09 PM   K 4.0 03/23/2011  4:09 PM    CL 112 03/23/2011  4:09 PM   CO2 18* 03/23/2011  3:54 PM   BUN 51* 03/23/2011  4:09 PM   CREATININE 4.10* 03/23/2011  4:09 PM   GLUCOSE 91 03/23/2011  4:09 PM   Lab Results  Component Value Date   WBC 11.7* 03/23/2011   HGB 14.6 03/23/2011   HCT 43.0 03/23/2011   MCV 92.9 03/23/2011   PLT 50* 03/23/2011     Assessment and Plan: Richard Reed is a 77 y.o. year old male presenting with acute dehydration, pulmonary edema, and renal failure  1. Dehydration: Patient significantly dehydrated. Likely viral gastroenteritis versus food poisoning. Pt received 4 1 L boluses prior to coming to the floor. Pt noted to be dehydrated on exam and per VS but w/ pulmonary edema. Will allow pt to correct by PO. Will reassess patient's status frequently over the course of the night. Strict I.'s and O.'s. Transaminitis likely secondary to dehydration and viral process. Low concern for true viral hepatitis. CMET in the morning to evaluate. Zofran for Nausea  2.   Acute on chronic renal failure: Baseline creatinine for patient around 2. Current acute renal failure with creatinine to 4.1 likely secondary to dehydration. Will obtain repeat CMET in the morning to evaluate.    3.   Cardiovascular: Personal history of A. Fib.and valvular insufficiency.  Irregular heartbeat noted in ED. Will obtain EKG. Patient currently not anticoagulated. Tachycardia and hypotension likely secondary to severe dehydration. No signs of sepsis. Will hold metoprolol. Will place on telemetry. History of normal cath.  4.   ID: Lilkely self-limiting viral process. Low concern for bacterial etiology. Afebrile. Will continue to monitor.  5.   Respiratory: Pulmonary edema noted on CXR. Will hold IVF for now and allow pt to PO ad lib. Will provide O2 to keep sats greater than 88%. Low threshold for repeat CXR if clinically deteriorates  6. FEN/GI: IV fluids as above. Regular diet as tolerated. Zofran for nausea. Anion gap  resolved  7. Prophylaxis: Heparin 5000 units TID for DVT  8. Disposition: Pending clinical improvement.    Signed: Shelly Flatten, M.D. Family Medicine Resident PGY-1 03/23/2011 8:36 PM  Upper Level Addendum: I saw and examined the patient with Dr. Konrad Dolores. I agree with his assessment and plan.  1) Dehydration: resulting in hypotension and tachycardia due to gastroenteritis. S/P >3L NS fluid resuscitation.  Plan to continue fluid rehydration orally as pt is displaying some signs of impending pulmonary edema.  2) Gastroenteritis: Likely due to food poisoning vs viral. Plan to provide symptom management. Will repeat CBC and CMP in the AM but no current indications for systemic infection.    3) Question of pulmonary edema: Normal cath, but history of "leaky valves". Will repeat CXR and follow physical exam for crackles. Avoid diuresis but will give if needed. Follow closely in step down.   Will follow up with team in the morning.   COREY,EVAN 10:07 PM

## 2011-03-23 NOTE — Progress Notes (Signed)
PGY-1 Daily Progress Note Family Medicine Teaching Service D. Piloto Rolene Arbour, MD Service Pager: [phone]  Patient name: Richard Reed    Medical record number: [id number]   Date of birth: [birth year]    Age: 77 y.o.      Gender: male   Primary Care Provider: Lucilla Edin, MD  Subjective:   Moderated work of breathing.   Objective:  Temp:  [98 F-101.4 F ] 98 F   Pulse Rate:  [61-120] 114  Resp:  [16-30] 27   BP: (80-137)/(38-57) 98/55 mmHg  last set of BP: 112/72 SpO2:  [93 %-97 %] 94 %  on 4 L Weight:  164 lb 0.4 oz (74.4 kg)   Intake/Output Summary (Last 24 hours) at 03/23/11 2321 Last data filed at 03/23/11 2010  Gross per 24 hour  Intake   4000 ml  Output      0 ml  Net   4000 ml    Gen:  Mild acute distress due to work of breathing. Alert and cooperative with physical examination. HEENT: Moist mucous membranes CV:  Tachycardic and irregular rhythm, no murmurs rubs or gallops. Presence of JVD. PULM:  Diminish breath sounds bilaterally. No wheezes, rales or rhonchi.  ABD: Mildly distended, non tender, normal bowel sounds. EXT: No edema Neuro: Alert and oriented x3. No motor or sensorial focalization.  Labs and imaging: Results for orders placed during the hospital encounter of 03/23/11 (from the past 24 hour(s))  PROTIME-INR     Status: Abnormal      Component Value Range   Prothrombin Time 17.8 (*) 11.6 - 15.2 (seconds)   INR 1.44  0.00 - 1.49    aPTT 36  24 - 37 (seconds)  CBC     Status: Abnormal      Component Value Range   WBC 11.7 (*) 4.0 - 10.5 (K/uL)   RBC 4.62  4.22 - 5.81 (MIL/uL)   Hemoglobin 15.2  13.0 - 17.0 (g/dL)   HCT 14.7  82.9 - 56.2 (%)   MCV 92.9  78.0 - 100.0 (fL)   MCH 32.9  26.0 - 34.0 (pg)   MCHC 35.4  30.0 - 36.0 (g/dL)   RDW 13.0  86.5 - 78.4 (%)   Platelets 50 (*) 150 - 400 (K/uL)  DIFFERENTIAL     Status: Abnormal      Component Value Range   Neutrophils Relative 95 (*) 43 - 77 (%)   Lymphocytes Relative 2 (*) 12 - 46 (%)   Monocytes Relative 3  3 - 12 (%)   Eosinophils Relative 0  0 - 5 (%)   Basophils Relative 0  0 - 1 (%)   Neutro Abs 11.1 (*) 1.7 - 7.7 (K/uL)   Lymphs Abs 0.2 (*) 0.7 - 4.0 (K/uL)   Monocytes Absolute 0.4  0.1 - 1.0 (K/uL)   Eosinophils Absolute 0.0  0.0 - 0.7 (K/uL)   Basophils Absolute 0.0  0.0 - 0.1 (K/uL)   WBC Morphology MILD LEFT SHIFT (1-5% METAS, OCC MYELO, OCC BANDS)    COMPREHENSIVE METABOLIC PANEL     Status: Abnormal   Sodium 141  135 - 145 (mEq/L)   Potassium 3.9  3.5 - 5.1 (mEq/L)   Chloride 106  96 - 112 (mEq/L)   CO2 18 (*) 19 - 32 (mEq/L)   Glucose, Bld 89  70 - 99 (mg/dL)   BUN 51 (*) 6 - 23 (mg/dL)   Creatinine, Ser 6.96 (*) 0.50 - 1.35 (mg/dL)  Calcium 8.3 (*) 8.4 - 10.5 (mg/dL)   Total Protein 5.6 (*) 6.0 - 8.3 (g/dL)   Albumin 2.6 (*) 3.5 - 5.2 (g/dL)   AST 161 (*) 0 - 37 (U/L)   ALT 102 (*) 0 - 53 (U/L)   Alkaline Phosphatase 66  39 - 117 (U/L)   Total Bilirubin 2.2 (*) 0.3 - 1.2 (mg/dL)   GFR calc non Af Amer 14 (*) >90 (mL/min)   GFR calc Af Amer 17 (*) >90 (mL/min)  CK TOTAL AND CKMB     Status: Abnormal  TROPONIN I     Status: Normal      Component Value Range   Troponin I <0.30  <0.30 (ng/mL)  POCT I-STAT, CHEM 8     Status: Abnormal   Collection Time   03/23/11  4:09 PM      Component Value Range   Sodium 140  135 - 145 (mEq/L)   Potassium 4.0  3.5 - 5.1 (mEq/L)   Chloride 112  96 - 112 (mEq/L)   BUN 51 (*) 6 - 23 (mg/dL)   Creatinine, Ser 0.96 (*) 0.50 - 1.35 (mg/dL)   Glucose, Bld 91  70 - 99 (mg/dL)   Calcium, Ion 0.45 (*) 1.12 - 1.32 (mmol/L)   TCO2 18  0 - 100 (mmol/L)   Hemoglobin 14.6  13.0 - 17.0 (g/dL)   HCT 40.9  81.1 - 91.4 (%)  LACTIC ACID, PLASMA     Status: Abnormal   Collection Time   03/23/11  5:26 PM      Component Value Range   Lactic Acid, Venous 3.3 (*) 0.5 - 2.2 (mmol/L)     Anion GAP 10, corrected with albumin 14.  Ct Head Wo Contrast 03/23/2011  **ADDENDUM** CREATED: 03/23/2011 15:51:58  Voicemail left with Dr. Lyman Speller  (phone [phone]) at 3:53 p.m. on 03/23/2011.  **END ADDENDUM** SIGNED BY: Karn Cassis. Reche Dixon, M.D.    IMPRESSION:  1. No acute intracranial abnormality. 2. Cerebral atrophy and small vessel ischemic change.  Original Report Authenticated By: Consuello Bossier, M.D.   Dg Chest Port 1 View 03/23/2011  PORTABLE CHEST - 1 VIEW  .  IMPRESSION: Recurrent diffuse bilateral pulmonary opacities most consistent with edema.  Calcified pleural plaque on the left.  Original Report Authenticated By: Gerrianne Scale, M.D.   Assessment and Plan: 77 y/o M admitted for dehydration, pulmonary edema and acute over chronic renal failure. Pt  had one episode of fever of 101.4. Tachycardic and hypotensive. Tachypneic with requirement of 4 L of O2 to maintain O2 saturation 94%. No extremely elevated WBC but 95% relative of neutrophile and bands in CBC diff.  CXR positive for diffuse bilateral opacities most consistent with edema but with this presentation we can't completely r/o infectious pulmonary process. Plan: Added to his prior orders:  Bmet stat, Blood cultures x 2. Started on Ceftriaxone, and Vancomycin per pharmacy due to renal condition. Continue close monitoring.   D. Piloto Rolene Arbour, MD PGY1, Family Medicine Teaching Service [phone] 03/23/2011

## 2011-03-23 NOTE — Progress Notes (Signed)
ANTIBIOTIC CONSULT NOTE - INITIAL  Pharmacy Consult for Vancomycin Indication: rule out pneumonia  No Known Allergies  Patient Measurements: Height: 5\' 10"  (177.8 cm) Weight: 164 lb 0.4 oz (74.4 kg) IBW/kg (Calculated) : 73   Vital Signs: Temp: 98 F (36.7 C) (12/05 2138) Temp src: Oral (12/05 2138) BP: 98/55 mmHg (12/05 2138) Pulse Rate: 114  (12/05 2138) Intake/Output from previous day:   Intake/Output from this shift: Total I/O In: 4000 [I.V.:4000] Out: -   Labs:  Basename 03/23/11 1609 03/23/11 1554  WBC -- 11.7*  HGB 14.6 15.2  PLT -- 50*  LABCREA -- --  CREATININE 4.10* 3.59*   Estimated Creatinine Clearance: 14.1 ml/min (by C-G formula based on Cr of 4.1). No results found for this basename: VANCOTROUGH:2,VANCOPEAK:2,VANCORANDOM:2,GENTTROUGH:2,GENTPEAK:2,GENTRANDOM:2,TOBRATROUGH:2,TOBRAPEAK:2,TOBRARND:2,AMIKACINPEAK:2,AMIKACINTROU:2,AMIKACIN:2, in the last 72 hours   Medical History: Past Medical History  Diagnosis Date  . Irregular heart rate   . Kidney stones   . Hard of hearing   . CKD (chronic kidney disease)   . HTN (hypertension)   . Hiatal hernia     Medications:  Prescriptions prior to admission  Medication Sig Dispense Refill  . metoprolol succinate (TOPROL-XL) 25 MG 24 hr tablet Take 25 mg by mouth daily.         Assessment: 77 yo male with poss PNA, ARF, for empiric antibiotics.  Goal of Therapy:  Vancomycin trough level 15-20 mcg/ml  Plan:  Vancomycin 1500 mg IV now.  F/U rernal fxn and redose as indicated.  Levy Cedano, Gary Fleet 03/23/2011,11:42 PM

## 2011-03-23 NOTE — ED Notes (Signed)
Pt has been having n/v/d since last night after eating.  Today when pt woke he continued with n/v wife had to help him walk a little.  Around 1400 pt began c/o blurred vision, when wife tried to help him stand to take him to urgent care he became dizzy.

## 2011-03-23 NOTE — H&P (Signed)
Pt went out to eat with his wife last night and had oyster stew. Afterwards he vomited and had diarrhea. He went to bed around 0930. This morning he awoke at 0800 and still did not feel well; again vomited small amount and cont with diarrhea.  No PO intake except small amt of ginger ale. He was able to walk to the LR, sat in his chair until around 1000 when he felt worse and wife assisted him back to bed. She did not notice any focal weakness, just generally weak.  At 1400 he c/o dizziness and was unable to sit up. She called 9-1-1 and they activated code stroke at 1516. Pt arrived in the ED at 1530. EDP examined pt at that time. Stroke team was here at 1525.  To CT at 1539. NIHSS is 1 for RLE weakness. Code stroke canceled at 1555 by Dr. Lyman Speller.

## 2011-03-23 NOTE — Consult Note (Signed)
Reason for Consult: "slurred speech and generalized weakness"  HPI: Richard Reed is an 77 y.o. Male who was brought in with generalized weakness and double vision. Patient was unable to sit up at 14:00 pm and a stroke code was called. Patient's NIHSS score was 1. Stroke code was cancelled due to low NIHSS score. Patient had diarrhea and vomiting since eating clams yesterday evening.   Past Medical History  Diagnosis Date  . Irregular heart rate    PSH: No past surgical history on file.  FH: No family history on file.  Social History:  does not have a smoking history on file. He does not have any smokeless tobacco history on file. His alcohol and drug histories not on file.  Allergies: No Known Allergies  Medications: I have reviewed the patient's current medications.  ROS: as above  Blood pressure 91/50, pulse 103, temperature 98.2 F (36.8 C), temperature source Oral, SpO2 95.00%.  Neurological exam: AAO*3. No aphasia. Followed complex commands. Cranial nerves: EOMI, PERRL. Visual fields were full. Sensation to V1 through V3 areas of the face was intact and symmetric throughout. There was no facial asymmetry. Hearing to finger rub was equal and symmetrical bilaterally. Shoulder shrug was 5/5 and symmetric bilaterally. Head rotation was 5/5 bilaterally. There was no dysarthria or palatal deviation. Motor: strength was 5/5 and symmetric throughout except proximal right lower extremity, which was 4/5. Sensory: was intact throughout to light touch, pinprick. Coordination: finger-to-nose and heel-to-shin were intact and symmetric bilaterally. Reflexes: were 2+ in upper extremities and 1+ at the knees and 1+ at the ankles. Plantar response was downgoing bilaterally. Gait: deferred  Ct Head Wo Contrast  03/23/2011  **ADDENDUM** CREATED: 03/23/2011 15:51:58  Voicemail left with Dr. Lyman Speller (phone [phone]) at 3:53 p.m. on 03/23/2011.  **END ADDENDUM** SIGNED BY: Karn Cassis. Reche Dixon, M.D.   03/23/2011   *RADIOLOGY REPORT*  Clinical Data: Blurred vision.  CT HEAD WITHOUT CONTRAST  Technique:  Contiguous axial images were obtained from the base of the skull through the vertex without contrast.  Comparison: 11/04/2003  Findings: Bone windows demonstrate clear paranasal sinuses and mastoid air cells.  Soft tissue windows demonstrate expected cerebral atrophy.  Mild low density in the periventricular white matter likely related to small vessel disease.Cerebellar atrophy is moderate. No  mass lesion, hemorrhage, hydrocephalus, acute infarct, intra-axial, or extra-axial fluid collection.  IMPRESSION:  1. No acute intracranial abnormality. 2. Cerebral atrophy and small vessel ischemic change.  Original Report Authenticated By: Consuello Bossier, M.D.   Assessment/Plan: 78 years old man with dehydration/orthostasis secondary to diarrhea/vomitting who came in with generalized weakness and double vision. Not a t-PA candidate due to low NIHSS score. Majority of symptoms likely due to dehydration. 1) MRI Brain 2) IV fluids 3) Will follow  Bitha Fauteux 03/23/2011, 3:59 PM

## 2011-03-23 NOTE — Progress Notes (Signed)
77 year old male was noted to have some slurred speech and some difficulty with his vision this morning. He also has had some vomiting and diarrhea. He was last known normal when he went to bed last night at about 9:30 PM. In the emergency department, he is noted to be somewhat hypotensive with blood pressure around 90 systolic. Speech is mildly dysarthric. He has no focal motor or sensory deficits. Heart is irregular with a 2-3/6 holosystolic murmur heard at the apex radiating to the base. He is noted to be off balance when sitting up, but it is not clear if this is related to blood pressure or primary balance problem. Neurology has seen him and deemed him not to be a TPA candidate based on very low NIH stroke scale and uncertain time of onset.  He continued to be hypotensive in spite of fluid resuscitation. Laboratory workup shows a rise in BUN and creatinine. It is likely that dehydration is responsible for his hypertension and his other symptoms.  CRITICAL CARE Performed by: Dione Booze   Total critical care time: 90 minutes  Critical care time was exclusive of separately billable procedures and treating other patients.  Critical care was necessary to treat or prevent imminent or life-threatening deterioration.  Critical care was time spent personally by me on the following activities: development of treatment plan with patient and/or surrogate as well as nursing, discussions with consultants, evaluation of patient's response to treatment, examination of patient, obtaining history from patient or surrogate, ordering and performing treatments and interventions, ordering and review of laboratory studies, ordering and review of radiographic studies, pulse oximetry and re-evaluation of patient's condition.  I saw and evaluated the patient, reviewed the resident's note and I agree with the findings and plan. I have reviewed the resident's interpretation of ECG and agree with the exception that  nonspecific ST-T wave changes are present. The CT is not significantly different from the ECG of 08/02/2010.

## 2011-03-23 NOTE — ED Provider Notes (Signed)
History     CSN: [id number] Arrival date & time: 03/23/2011  3:46 PM   First MD Initiated Contact with Patient 03/23/11 1548      Chief Complaint  Patient presents with  . Blurred Vision    (Consider location/radiation/quality/duration/timing/severity/associated sxs/prior treatment) Patient is a 77 y.o. male presenting with vomiting and weakness. The history is provided by the patient and the spouse.  Emesis  This is a new problem. The current episode started yesterday. The problem occurs 5 to 10 times per day. The problem has not changed since onset.The emesis has an appearance of stomach contents. There has been no fever. Associated symptoms include diarrhea. Pertinent negatives include no abdominal pain, no arthralgias, no chills, no cough, no fever, no headaches, no myalgias and no URI. Risk factors include suspect food intake.  Weakness The primary symptoms include dizziness, speech change and vomiting. Primary symptoms do not include headaches, loss of consciousness, altered mental status, focal weakness, fever or nausea. Episode onset: unknown exact onset. The symptoms are waxing and waning. The neurological symptoms are diffuse.  Dizziness also occurs with vomiting and weakness. Dizziness does not occur with nausea.  Additional symptoms include weakness and loss of balance. Medical issues also include hypertension.    Past Medical History  Diagnosis Date  . Irregular heart rate     History reviewed. No pertinent past surgical history.  No family history on file.  History  Substance Use Topics  . Smoking status: Not on file  . Smokeless tobacco: Not on file  . Alcohol Use:       Review of Systems  Unable to perform ROS Constitutional: Negative for fever and chills.  Eyes: Positive for visual disturbance (resolved).  Respiratory: Negative for cough and shortness of breath.   Cardiovascular: Negative for chest pain and palpitations.  Gastrointestinal: Positive for  vomiting and diarrhea. Negative for nausea and abdominal pain.  Musculoskeletal: Negative for myalgias and arthralgias.  Skin: Negative for color change and rash.  Neurological: Positive for dizziness, speech change, speech difficulty ("muffled"), weakness and loss of balance. Negative for focal weakness, loss of consciousness, light-headedness and headaches.  Psychiatric/Behavioral: Negative for confusion, decreased concentration and altered mental status.  All other systems reviewed and are negative.    Allergies  Review of patient's allergies indicates no known allergies.  Home Medications   Current Outpatient Rx  Name Route Sig Dispense Refill  . METOCLOPRAMIDE HCL PO Oral Take by mouth.        BP 91/53  Pulse 115  Temp(Src) 98.2 F (36.8 C) (Oral)  SpO2 95%  Physical Exam  Nursing note and vitals reviewed. Constitutional: He is oriented to person, place, and time. He appears well-developed and well-nourished.  HENT:  Head: Normocephalic and atraumatic.  Eyes: EOM are normal. Pupils are equal, round, and reactive to light.  Cardiovascular: Regular rhythm.  Tachycardia present.   Pulmonary/Chest: Effort normal and breath sounds normal. No respiratory distress.  Abdominal: Soft. There is no tenderness.  Neurological: He is alert and oriented to person, place, and time. He has normal strength and normal reflexes. No cranial nerve deficit or sensory deficit.       Hard of hearing. Complaining of feeling "drunk" when sitting up; no nystagmus noted.  Skin: Skin is warm and dry.  Psychiatric: He has a normal mood and affect.    ED Course  Procedures (including critical care time)  Labs Reviewed  CBC - Abnormal; Notable for the following:    WBC 11.7 (*)  All other components within normal limits  POCT I-STAT, CHEM 8 - Abnormal; Notable for the following:    BUN 51 (*)    Creatinine, Ser 4.10 (*)    Calcium, Ion 1.05 (*)    All other components within normal limits    DIFFERENTIAL  PROTIME-INR  APTT  COMPREHENSIVE METABOLIC PANEL  CK TOTAL AND CKMB  TROPONIN I   Ct Head Wo Contrast  03/23/2011  **ADDENDUM** CREATED: 03/23/2011 15:51:58  Voicemail left with Dr. Lyman Speller (phone [phone]) at 3:53 p.m. on 03/23/2011.  **END ADDENDUM** SIGNED BY: Karn Cassis. Reche Dixon, M.D.   03/23/2011  *RADIOLOGY REPORT*  Clinical Data: Blurred vision.  CT HEAD WITHOUT CONTRAST  Technique:  Contiguous axial images were obtained from the base of the skull through the vertex without contrast.  Comparison: 11/04/2003  Findings: Bone windows demonstrate clear paranasal sinuses and mastoid air cells.  Soft tissue windows demonstrate expected cerebral atrophy.  Mild low density in the periventricular white matter likely related to small vessel disease.Cerebellar atrophy is moderate. No  mass lesion, hemorrhage, hydrocephalus, acute infarct, intra-axial, or extra-axial fluid collection.  IMPRESSION:  1. No acute intracranial abnormality. 2. Cerebral atrophy and small vessel ischemic change.  Original Report Authenticated By: Consuello Bossier, M.D.    Date: 03/23/2011  Rate: 104  Rhythm: sinus tachycardia  QRS Axis: left  Intervals: normal  ST/T Wave abnormalities: normal  Conduction Disutrbances:none  Narrative Interpretation: sinus rhythm, multiple PVCs, no signs of acute ischemia  Old EKG Reviewed: changes noted multiple PVCs, but pt no longer in bigeminy    1. Vomiting and diarrhea   2. Acute renal failure   3. Dehydration   4. Weakness   5. Hypotension       MDM  This is an 77 year old male who presents as a code stroke, which was called in the field due to complaints of blurry vision. The patient eight oyster stew last night, and had several episodes of vomiting and diarrhea overnight, and again this morning. He was noted to be at his baseline last night, however this morning he was noted to be weak, though he was steady on his feet. Still not feeling well, he went back to the  bedroom to lay down in the mid to late morning. According to his wife she noticed that he continued to be weak into the early afternoon, and approximately 2:00 his wife noticed slurred speech, and around 2:15 the patient began complaining of feeling like he was having trouble seeing. When his wife went to stand him up to bring him to the urgent care Center, patient began to complain of dizziness, and was found to to be unsteady on his feet. Upon arrival, he is alert and mildly hypotensive, he complains of feeling "drunk" when being sat upright, but has no focal neurologic deficits. CT scan was performed, which showed no signs of acute bleed, and the neurology team was present at bedside for help in evaluation of appropriateness of TPA. Due to the NIHSS score, and unknown exact time of onset, the decision was made not to give TPA. Will check basic labs, as well as orthostatic vital signs, and give patient fluids.  Patient with some improvement of his blood pressure following several liters of fluid. He was found to be in acute renal failure. Her orthostatics were only partially able to be obtained, due to the patient's inability to stand upright for long enough for standing blood pressure to be taken, however there is not a significant  drop in blood pressure between lying and sitting. Discussed the patient with the family medicine team, who the patient to step down unit for further observation and management. The patient's complaints of weakness could be due to pendulous TIA, however they could also be due to significant dehydration secondary to multiple episodes of vomiting and diarrhea.      Theotis Burrow, MD 03/24/11 [id number]

## 2011-03-24 ENCOUNTER — Inpatient Hospital Stay (HOSPITAL_COMMUNITY): Payer: Medicare Other

## 2011-03-24 DIAGNOSIS — J9600 Acute respiratory failure, unspecified whether with hypoxia or hypercapnia: Secondary | ICD-10-CM

## 2011-03-24 DIAGNOSIS — N139 Obstructive and reflux uropathy, unspecified: Secondary | ICD-10-CM

## 2011-03-24 DIAGNOSIS — N179 Acute kidney failure, unspecified: Secondary | ICD-10-CM

## 2011-03-24 DIAGNOSIS — G9340 Encephalopathy, unspecified: Secondary | ICD-10-CM | POA: Diagnosis not present

## 2011-03-24 DIAGNOSIS — R578 Other shock: Secondary | ICD-10-CM

## 2011-03-24 DIAGNOSIS — E872 Acidosis, unspecified: Secondary | ICD-10-CM | POA: Diagnosis present

## 2011-03-24 DIAGNOSIS — A4151 Sepsis due to Escherichia coli [E. coli]: Secondary | ICD-10-CM

## 2011-03-24 DIAGNOSIS — J984 Other disorders of lung: Secondary | ICD-10-CM

## 2011-03-24 DIAGNOSIS — D696 Thrombocytopenia, unspecified: Secondary | ICD-10-CM | POA: Diagnosis present

## 2011-03-24 DIAGNOSIS — J13 Pneumonia due to Streptococcus pneumoniae: Secondary | ICD-10-CM

## 2011-03-24 HISTORY — DX: Sepsis due to Escherichia coli (e. coli): A41.51

## 2011-03-24 LAB — CBC
HCT: 42.5 % (ref 39.0–52.0)
Hemoglobin: 14.7 g/dL (ref 13.0–17.0)
MCV: 93.8 fL (ref 78.0–100.0)
RBC: 4.53 MIL/uL (ref 4.22–5.81)
RDW: 13.8 % (ref 11.5–15.5)
WBC: 12.1 10*3/uL — ABNORMAL HIGH (ref 4.0–10.5)

## 2011-03-24 LAB — BASIC METABOLIC PANEL
BUN: 54 mg/dL — ABNORMAL HIGH (ref 6–23)
BUN: 65 mg/dL — ABNORMAL HIGH (ref 6–23)
CO2: 16 mEq/L — ABNORMAL LOW (ref 19–32)
CO2: 18 mEq/L — ABNORMAL LOW (ref 19–32)
Chloride: 112 mEq/L (ref 96–112)
Chloride: 113 mEq/L — ABNORMAL HIGH (ref 96–112)
Creatinine, Ser: 3.3 mg/dL — ABNORMAL HIGH (ref 0.50–1.35)
Creatinine, Ser: 3.41 mg/dL — ABNORMAL HIGH (ref 0.50–1.35)
GFR calc Af Amer: 18 mL/min — ABNORMAL LOW (ref >90)
Glucose, Bld: 84 mg/dL (ref 70–99)
Potassium: 4.8 mEq/L (ref 3.5–5.1)

## 2011-03-24 LAB — PROCALCITONIN: Procalcitonin: 65.26 ng/mL

## 2011-03-24 LAB — COMPREHENSIVE METABOLIC PANEL
AST: 92 U/L — ABNORMAL HIGH (ref 0–37)
Albumin: 2.8 g/dL — ABNORMAL LOW (ref 3.5–5.2)
Albumin: 2.1 g/dL — ABNORMAL LOW (ref 3.5–5.2)
Alkaline Phosphatase: 82 U/L (ref 39–117)
BUN: 65 mg/dL — ABNORMAL HIGH (ref 6–23)
Calcium: 6.5 mg/dL — ABNORMAL LOW (ref 8.4–10.5)
Chloride: 111 mEq/L (ref 96–112)
Creatinine, Ser: 3.35 mg/dL — ABNORMAL HIGH (ref 0.50–1.35)
Creatinine, Ser: 3.38 mg/dL — ABNORMAL HIGH (ref 0.50–1.35)
GFR calc Af Amer: 18 mL/min — ABNORMAL LOW (ref >90)
Glucose, Bld: 141 mg/dL — ABNORMAL HIGH (ref 70–99)
Potassium: 3.7 mEq/L (ref 3.5–5.1)
Total Bilirubin: 2.8 mg/dL — ABNORMAL HIGH (ref 0.3–1.2)
Total Protein: 6 g/dL (ref 6.0–8.3)
Total Protein: 4.6 g/dL — ABNORMAL LOW (ref 6.0–8.3)

## 2011-03-24 LAB — CARDIAC PANEL(CRET KIN+CKTOT+MB+TROPI)
CK, MB: 6.3 ng/mL (ref 0.3–4.0)
Relative Index: 3.8 — ABNORMAL HIGH (ref 0.0–2.5)
Total CK: 164 U/L (ref 7–232)
Troponin I: 1.55 ng/mL (ref <0.30)

## 2011-03-24 LAB — BLOOD GAS, ARTERIAL
Acid-base deficit: 12.3 mmol/L — ABNORMAL HIGH (ref 0.0–2.0)
Bicarbonate: 12.6 mEq/L — ABNORMAL LOW (ref 20.0–24.0)
TCO2: 13.4 mmol/L (ref 0–100)
pCO2 arterial: 25.5 mmHg — ABNORMAL LOW (ref 35.0–45.0)
pH, Arterial: 7.316 — ABNORMAL LOW (ref 7.350–7.450)

## 2011-03-24 LAB — POCT I-STAT 3, ART BLOOD GAS (G3+)
Acid-base deficit: 10 mmol/L — ABNORMAL HIGH (ref 0.0–2.0)
Bicarbonate: 13.1 mEq/L — ABNORMAL LOW (ref 20.0–24.0)
O2 Saturation: 100 %
O2 Saturation: 97 %
Patient temperature: 38
TCO2: 14 mmol/L (ref 0–100)
pH, Arterial: 7.317 — ABNORMAL LOW (ref 7.350–7.450)

## 2011-03-24 LAB — BODY FLUID CELL COUNT WITH DIFFERENTIAL
Eos, Fluid: 0 %
Neutrophil Count, Fluid: 83 % — ABNORMAL HIGH (ref 0–25)
Total Nucleated Cell Count, Fluid: 125 cu mm (ref 0–1000)

## 2011-03-24 LAB — DIFFERENTIAL
Basophils Relative: 0 % (ref 0–1)
Eosinophils Relative: 0 % (ref 0–5)
Lymphs Abs: 0.4 10*3/uL — ABNORMAL LOW (ref 0.7–4.0)
Monocytes Relative: 2 % — ABNORMAL LOW (ref 3–12)
Neutro Abs: 11.5 10*3/uL — ABNORMAL HIGH (ref 1.7–7.7)

## 2011-03-24 LAB — GLUCOSE, CAPILLARY: Glucose-Capillary: 159 mg/dL — ABNORMAL HIGH (ref 70–99)

## 2011-03-24 LAB — INFLUENZA PANEL BY PCR (TYPE A & B)
H1N1 flu by pcr: NOT DETECTED
Influenza B By PCR: NEGATIVE

## 2011-03-24 LAB — AFB CULTURE WITH SMEAR (NOT AT ARMC)

## 2011-03-24 LAB — CLOSTRIDIUM DIFFICILE BY PCR: Toxigenic C. Difficile by PCR: NEGATIVE

## 2011-03-24 LAB — LIPASE, BLOOD: Lipase: 6 U/L — ABNORMAL LOW (ref 11–59)

## 2011-03-24 LAB — TROPONIN I: Troponin I: <0.3 ng/mL (ref <0.30)

## 2011-03-24 LAB — APTT: aPTT: 45 seconds — ABNORMAL HIGH (ref 24–37)

## 2011-03-24 LAB — TECHNOLOGIST SMEAR REVIEW: Path Review: INCREASED

## 2011-03-24 MED ORDER — FENTANYL CITRATE 0.05 MG/ML IJ SOLN
25.0000 ug | INTRAMUSCULAR | Status: DC | PRN
  Administered 2011-03-24 – 2011-03-27 (×4): 100 ug via INTRAVENOUS
  Filled 2011-03-24 (×5): qty 2

## 2011-03-24 MED ORDER — PROPOFOL 10 MG/ML IV EMUL
5.0000 ug/kg/min | INTRAVENOUS | Status: DC
  Administered 2011-03-24: 44.803 ug/kg/min via INTRAVENOUS
  Filled 2011-03-24: qty 100

## 2011-03-24 MED ORDER — LEVOFLOXACIN IN D5W 750 MG/150ML IV SOLN: 750.0000 mg | INTRAVENOUS | Status: DC

## 2011-03-24 MED ORDER — LEVOFLOXACIN IN D5W 750 MG/150ML IV SOLN
750.0000 mg | INTRAVENOUS | Status: AC
  Administered 2011-03-24: 750 mg via INTRAVENOUS
  Filled 2011-03-24: qty 150

## 2011-03-24 MED ORDER — DEXTROSE 50 % IV SOLN
INTRAVENOUS | Status: AC
  Administered 2011-03-24: 50 mL via INTRAVENOUS
  Filled 2011-03-24: qty 50

## 2011-03-24 MED ORDER — MIDAZOLAM HCL 2 MG/2ML IJ SOLN: 1.0000 mg | INTRAMUSCULAR | Status: DC | PRN

## 2011-03-24 MED ORDER — DIGOXIN 0.25 MG/ML IJ SOLN: 0.5000 mg | Freq: Once | INTRAMUSCULAR | Status: DC

## 2011-03-24 MED ORDER — SODIUM CHLORIDE 0.9 % IV SOLN
750.0000 mL | INTRAVENOUS | Status: DC | PRN
  Administered 2011-03-24: 1000 mL via INTRAVENOUS
  Administered 2011-03-24: 500 mL via INTRAVENOUS

## 2011-03-24 MED ORDER — GELATIN ABSORBABLE EX FILM
ORAL_FILM | Freq: Once | CUTANEOUS | Status: AC
  Administered 2011-03-24: 12:00:00 via TOPICAL
  Filled 2011-03-24: qty 1

## 2011-03-24 MED ORDER — PROPOFOL 10 MG/ML IV EMUL
INTRAVENOUS | Status: AC
  Filled 2011-03-24: qty 100

## 2011-03-24 MED ORDER — DEXTROSE 5 % IV SOLN
INTRAVENOUS | Status: DC
  Administered 2011-03-24: 20:00:00 via INTRAVENOUS

## 2011-03-24 MED ORDER — FENTANYL CITRATE 0.05 MG/ML IJ SOLN
25.0000 ug | INTRAMUSCULAR | Status: DC | PRN
  Administered 2011-03-24: 50 ug via INTRAVENOUS

## 2011-03-24 MED ORDER — PROPOFOL 10 MG/ML IV EMUL
5.0000 ug/kg/min | INTRAVENOUS | Status: DC
  Administered 2011-03-24: 67.204 ug/kg/min via INTRAVENOUS
  Administered 2011-03-25: 15 ug/kg/min via INTRAVENOUS
  Administered 2011-03-25: 30 ug/kg/min via INTRAVENOUS
  Administered 2011-03-26: 10 ug/kg/min via INTRAVENOUS
  Filled 2011-03-24 (×3): qty 100

## 2011-03-24 MED ORDER — PHENYLEPHRINE HCL 10 MG/ML IJ SOLN
30.0000 ug/min | INTRAMUSCULAR | Status: DC
  Administered 2011-03-24: 3.333 ug/min via INTRAVENOUS
  Filled 2011-03-24 (×2): qty 1

## 2011-03-24 MED ORDER — MIDAZOLAM HCL 2 MG/2ML IJ SOLN
INTRAMUSCULAR | Status: AC
  Administered 2011-03-24: 2 mg
  Filled 2011-03-24: qty 2

## 2011-03-24 MED ORDER — SODIUM CHLORIDE 0.9 % IJ SOLN
INTRAMUSCULAR | Status: AC
  Administered 2011-03-24: 3 mL
  Filled 2011-03-24: qty 10

## 2011-03-24 MED ORDER — DIGOXIN 0.25 MG/ML IJ SOLN
0.2500 mg | Freq: Four times a day (QID) | INTRAMUSCULAR | Status: DC
  Filled 2011-03-24 (×2): qty 1

## 2011-03-24 MED ORDER — LEVOFLOXACIN IN D5W 500 MG/100ML IV SOLN
500.0000 mg | INTRAVENOUS | Status: DC
  Administered 2011-03-26 – 2011-03-28 (×2): 500 mg via INTRAVENOUS
  Filled 2011-03-24 (×2): qty 100

## 2011-03-24 MED ORDER — MIDAZOLAM HCL 2 MG/2ML IJ SOLN
INTRAMUSCULAR | Status: AC
  Filled 2011-03-24: qty 2

## 2011-03-24 MED ORDER — HALOPERIDOL LACTATE 5 MG/ML IJ SOLN: 5.0000 mg | Freq: Once | INTRAMUSCULAR | Status: DC

## 2011-03-24 MED ORDER — MIDAZOLAM BOLUS VIA INFUSION
1.0000 mg | INTRAVENOUS | Status: DC | PRN
  Filled 2011-03-24: qty 2

## 2011-03-24 MED ORDER — PHENYLEPHRINE HCL 10 MG/ML IJ SOLN
30.0000 ug/min | INTRAVENOUS | Status: DC
  Administered 2011-03-25: 80 ug/min via INTRAVENOUS
  Administered 2011-03-25: 150 ug/min via INTRAVENOUS
  Administered 2011-03-25: 110 ug/min via INTRAVENOUS
  Administered 2011-03-25: 100 ug/min via INTRAVENOUS
  Administered 2011-03-25: 150 ug/min via INTRAVENOUS
  Administered 2011-03-26: 110 ug/min via INTRAVENOUS
  Administered 2011-03-26: 60 ug/min via INTRAVENOUS
  Administered 2011-03-26 (×2): 110 ug/min via INTRAVENOUS
  Administered 2011-03-26: 80 ug/min via INTRAVENOUS
  Administered 2011-03-27: 40 ug/min via INTRAVENOUS
  Filled 2011-03-24 (×10): qty 4

## 2011-03-24 MED ORDER — HALOPERIDOL LACTATE 5 MG/ML IJ SOLN
INTRAMUSCULAR | Status: AC
  Filled 2011-03-24: qty 1

## 2011-03-24 MED ORDER — DEXTROSE 50 % IV SOLN
INTRAVENOUS | Status: AC
  Administered 2011-03-24: 50 mL
  Filled 2011-03-24: qty 50

## 2011-03-24 MED ORDER — SODIUM CHLORIDE 0.9 % IV SOLN
2.0000 mg/h | INTRAVENOUS | Status: DC
  Filled 2011-03-24: qty 10

## 2011-03-24 MED ORDER — SODIUM CHLORIDE 0.9 % IV BOLUS (SEPSIS)
1000.0000 mL | Freq: Once | INTRAVENOUS | Status: AC
  Administered 2011-03-24: 1000 mL via INTRAVENOUS

## 2011-03-24 MED ORDER — MAGNESIUM SULFATE 50 % IJ SOLN
2.0000 g | Freq: Once | INTRAMUSCULAR | Status: AC
  Administered 2011-03-24: 2 g via INTRAVENOUS
  Filled 2011-03-24: qty 4

## 2011-03-24 MED ORDER — HYDROCORTISONE SOD SUCCINATE 100 MG IJ SOLR
50.0000 mg | Freq: Four times a day (QID) | INTRAMUSCULAR | Status: AC
  Administered 2011-03-25 – 2011-03-28 (×16): 50 mg via INTRAVENOUS
  Administered 2011-03-28: 100 mg via INTRAVENOUS
  Administered 2011-03-28 – 2011-03-29 (×2): 50 mg via INTRAVENOUS
  Administered 2011-03-29: 100 mg via INTRAVENOUS
  Filled 2011-03-24 (×20): qty 1

## 2011-03-24 MED ORDER — SODIUM CHLORIDE 0.9 % IV SOLN
50.0000 ug/h | INTRAVENOUS | Status: DC
  Administered 2011-03-24: 50 ug/h via INTRAVENOUS
  Filled 2011-03-24: qty 50

## 2011-03-24 MED ORDER — SODIUM CHLORIDE 0.45 % IV SOLN: INTRAVENOUS | Status: DC

## 2011-03-24 MED ORDER — OSELTAMIVIR PHOSPHATE 75 MG PO CAPS
75.0000 mg | ORAL_CAPSULE | Freq: Two times a day (BID) | ORAL | Status: DC
  Administered 2011-03-25: 75 mg via ORAL
  Filled 2011-03-24 (×4): qty 1

## 2011-03-24 MED ORDER — SODIUM CHLORIDE 0.9 % IJ SOLN
INTRAMUSCULAR | Status: AC
  Administered 2011-03-24: 3 mL
  Filled 2011-03-24: qty 40

## 2011-03-24 MED ORDER — FENTANYL BOLUS VIA INFUSION
50.0000 ug | Freq: Four times a day (QID) | INTRAVENOUS | Status: DC | PRN
  Filled 2011-03-24: qty 100

## 2011-03-24 NOTE — Progress Notes (Signed)
eLink Physician-Brief Progress Note Patient Name: JAMAINE QUINTIN DOB: 1925/09/13 MRN: [id number]  Date of Service  03/24/2011   HPI/Events of Note   Several issues  1. Bleeding from aline site. Plat 47k at 4am 2. CVP 12, coox 64%, hgb 15gm% in morning but hypottensive 3. A Fib RVR with HR 130 and low bo 4. RN says prn sedation not working  eICU Interventions  #bleeding - dc aline, place pressure x 10 min and reasses. Chk plat #Sepsis - start phyenylephrine, rpot fluid bolus, if pressors dependent > 1h start hydrocort. Recheck all labs # start continuous sedation   Intervention Category Major Interventions: Shock - evaluation and management;Sepsis - evaluation and management;Other:  Radha Coggins 03/24/2011, 4:39 PM

## 2011-03-24 NOTE — Progress Notes (Signed)
eLink Physician-Brief Progress Note Patient Name: Richard Reed DOB: 1925-11-02 MRN: [id number]  Date of Service  03/24/2011   HPI/Events of Note  Continued agitation deespite haldol and versed bolus x 1 each Now hypoglycemic   eICU Interventions  1. Start diprivan 2. Start d5 water 3. Still awaiting labs   Intervention Category Major Interventions: Delirium, psychosis, severe agitation - evaluation and management;Other:  [name] 03/24/2011, 7:58 PM

## 2011-03-24 NOTE — Progress Notes (Signed)
ANTIBIOTIC CONSULT NOTE - INITIAL  Pharmacy Consult for levofloxacin + vancomycin Indication: rule out pneumonia  No Known Allergies  Patient Measurements: Height: 5\' 10"  (177.8 cm) Weight: 164 lb 0.4 oz (74.4 kg) IBW/kg (Calculated) : 73   Vital Signs: Temp: 97.1 F (36.2 C) (12/06 0800) Temp src: Oral (12/06 0800) BP: 122/68 mmHg (12/06 0800) Pulse Rate: 124  (12/06 0800) Intake/Output from previous day: 12/05 0701 - 12/06 0700 In: 4500 [I.V.:4000; IV Piggyback:500] Out: 450 [Urine:450] Intake/Output from this shift:    Labs:  Basename 03/24/11 0415 03/24/11 0020 03/23/11 1609 03/23/11 1554  WBC -- 12.1* -- 11.7*  HGB -- 14.7 14.6 15.2  PLT -- 47* -- 50*  LABCREA -- -- -- --  CREATININE 3.35* 3.41* 4.10* --   Estimated Creatinine Clearance: 17.3 ml/min (by C-G formula based on Cr of 3.35). No results found for this basename: VANCOTROUGH:2,VANCOPEAK:2,VANCORANDOM:2,GENTTROUGH:2,GENTPEAK:2,GENTRANDOM:2,TOBRATROUGH:2,TOBRAPEAK:2,TOBRARND:2,AMIKACINPEAK:2,AMIKACINTROU:2,AMIKACIN:2, in the last 72 hours   Microbiology: Recent Results (from the past 720 hour(s))  MRSA PCR SCREENING     Status: Normal   Collection Time   03/23/11  9:38 PM      Component Value Range Status Comment   MRSA by PCR NEGATIVE  NEGATIVE  Final     Medical History: Past Medical History  Diagnosis Date  . Irregular heart rate   . Kidney stones   . Hard of hearing   . CKD (chronic kidney disease)   . HTN (hypertension)   . Hiatal hernia     Medications:  Scheduled:    . acetaminophen  650 mg Oral Once  . cefTRIAXone (ROCEPHIN)  IV  1 g Intravenous QHS  . levofloxacin (LEVAQUIN) IV  500 mg Intravenous Q48H  . levofloxacin (LEVAQUIN) IV  750 mg Intravenous NOW  . oseltamivir  75 mg Oral BID  . sodium chloride  1,000 mL Intravenous Once  . sodium chloride  1,000 mL Intravenous Once  . sodium chloride  1,000 mL Intravenous Once  . sodium chloride      . vancomycin  1,500 mg  Intravenous Once  . DISCONTD: digoxin  0.25 mg Intravenous Q6H  . DISCONTD: digoxin  0.5 mg Intravenous Once  . DISCONTD: heparin  5,000 Units Subcutaneous Q8H  . DISCONTD: levofloxacin (LEVAQUIN) IV  750 mg Intravenous Q24H  . DISCONTD: sodium chloride  1,000 mL Intravenous Once   Anti-infectives     Start     Dose/Rate Route Frequency Ordered Stop   03/26/11 0900   levofloxacin (LEVAQUIN) IVPB 500 mg        500 mg 100 mL/hr over 60 Minutes Intravenous Every 48 hours 03/24/11 0842     03/24/11 2359   cefTRIAXone (ROCEPHIN) 1 g in dextrose 5 % 50 mL IVPB        1 g 100 mL/hr over 30 Minutes Intravenous Daily at bedtime 03/23/11 2306     03/24/11 1000   oseltamivir (TAMIFLU) capsule 75 mg        75 mg Oral 2 times daily 03/24/11 0836     03/24/11 0900   Levofloxacin (LEVAQUIN) IVPB 750 mg        750 mg 100 mL/hr over 90 Minutes Intravenous NOW 03/24/11 0842 03/25/11 0900   03/24/11 0845   Levofloxacin (LEVAQUIN) IVPB 750 mg  Status:  Discontinued     Comments: STAT      750 mg 100 mL/hr over 90 Minutes Intravenous Every 24 hours 03/24/11 0836 03/24/11 0841   03/24/11 0000   vancomycin (VANCOCIN) 1,500 mg  in sodium chloride 0.9 % 500 mL IVPB        1,500 mg 250 mL/hr over 120 Minutes Intravenous  Once 03/23/11 2347 03/24/11 0209         Assessment: 85 yom started on vancomycin+ ceftriaxone for r/o PNA. Also adding levaquin today. Pt is in ARF with a Scr of 3.35. With current renal function another vancomycin dose would not be due until 48 hours after the first one. Will hold off on writing further doses for now. Will follow renal function.   Goal of Therapy:  Vancomycin trough level 15-20 mcg/ml  Plan:  Measure antibiotic drug levels at steady state Follow up culture results Levofloxacin 750mg  IV x 1 then 500mg  IV Q48H F/u renal function for further vancomycin dosing and to adjust levaquin.   Sammantha Mehlhaff, Drake Leach 03/24/2011,8:55 AM

## 2011-03-24 NOTE — Progress Notes (Signed)
Subjective: Pt is hard of hearing and does not have his hearing aids in while we talk so communication is limited. He is having generalized weakness. Some mild pain in his right shoulder. No numbness, no problems with his speech. He is still having a dry mouth right now. No other complaints except feeling tired.  Objective: Vital signs in last 24 hours: Filed Vitals:   03/24/11 0000 03/24/11 0120 03/24/11 0400 03/24/11 0800  BP:  123/63 138/81 122/68  Pulse:   125 124  Temp: 97.1 F (36.2 C)  98.3 F (36.8 C) 97.1 F (36.2 C)  TempSrc: Oral  Rectal Oral  Resp:   25 30  Height:      Weight:      SpO2:   96% 99%   Weight change:   Intake/Output Summary (Last 24 hours) at 03/24/11 0848 Last data filed at 03/24/11 0800  Gross per 24 hour  Intake   4500 ml  Output    450 ml  Net   4050 ml   Physical Exam: General: resting in bed HEENT: PERRL, EOMI, no scleral icterus Cardiac: RRR, no rubs, murmurs or gallops Pulm: clear to auscultation bilaterally, moving normal volumes of air Abd: soft, nontender, nondistended, BS present Ext: warm and well perfused, no pedal edema Physical Exam: Neurologic Examination:  Mental Status:  Patient is hard of hearing but alert and oriented times 3. Able to answer questions appropriately.  Cranial Nerves:  II: patient responds to confrontation bilaterally, pupils right 3 mm, left 3 mm,and intact bilaterally  III,IV,VI: EOM intact, visual fields intact.  V,VII: corneal reflex present bilaterally  VIII: hearing is diminished bilaterally but intact IX,X: gag reflex present, XI: trapezius strength symmetric and intact XII: tongue strength normal  Motor:  Moves all 4 extremities spontaneously, strength is equal bilaterally upper and lower extremities Sensory:  Sensation is symmetric bilaterally Deep Tendon Reflexes:  2+ throughout Plantars:  downgoing bilaterally  Cerebellar:  Unable to perform   Lab Results: Basic Metabolic Panel:  Lab  03/24/11 0415 03/24/11 0020  NA 142 141  K 4.7 4.8  CL 111 112  CO2 18* 18*  GLUCOSE 77 84  BUN 55* 54*  CREATININE 3.35* 3.41*  CALCIUM 7.4* 7.3*  MG -- --  PHOS -- --   Liver Function Tests:  Lab 03/24/11 0415 03/23/11 1554  AST 92* 103*  ALT 95* 102*  ALKPHOS 80 66  BILITOT 2.8* 2.2*  PROT 6.0 5.6*  ALBUMIN 2.8* 2.6*   CBC:  Lab 03/24/11 0020 03/23/11 1609 03/23/11 1554  WBC 12.1* -- 11.7*  NEUTROABS 11.5* -- 11.1*  HGB 14.7 14.6 --  HCT 42.5 43.0 --  MCV 93.8 -- 92.9  PLT 47* -- 50*   Cardiac Enzymes:  Lab 03/23/11 1554  CKTOTAL 125  CKMB 3.4  CKMBINDEX --  TROPONINI <0.30   Coagulation:  Lab 03/23/11 1554  LABPROT 17.8*  INR 1.44   Micro Results: Recent Results (from the past 240 hour(s))  MRSA PCR SCREENING     Status: Normal   Collection Time   03/23/11  9:38 PM      Component Value Range Status Comment   MRSA by PCR NEGATIVE  NEGATIVE  Final    Studies/Results: Ct Head Wo Contrast  03/23/2011  **ADDENDUM** CREATED: 03/23/2011 15:51:58  Voicemail left with Dr. Lyman Speller (phone [phone]) at 3:53 p.m. on 03/23/2011.  **END ADDENDUM** SIGNED BY: Karn Cassis. Reche Dixon, M.D.   03/23/2011  *RADIOLOGY REPORT*  Clinical Data: Blurred vision.  CT HEAD WITHOUT CONTRAST  Technique:  Contiguous axial images were obtained from the base of the skull through the vertex without contrast.  Comparison: 11/04/2003  Findings: Bone windows demonstrate clear paranasal sinuses and mastoid air cells.  Soft tissue windows demonstrate expected cerebral atrophy.  Mild low density in the periventricular white matter likely related to small vessel disease.Cerebellar atrophy is moderate. No  mass lesion, hemorrhage, hydrocephalus, acute infarct, intra-axial, or extra-axial fluid collection.  IMPRESSION:  1. No acute intracranial abnormality. 2. Cerebral atrophy and small vessel ischemic change.  Original Report Authenticated By: Consuello Bossier, M.D.   Dg Chest Port 1 View  03/23/2011   *RADIOLOGY REPORT*  Clinical Data: Weakness.  PORTABLE CHEST - 1 VIEW  Comparison: 08/05/2010 and 08/03/2010 radiographs. Abdominal CT 08/03/2010.  Findings: 1740 hours. There is stable cardiomegaly and diffuse aortic atherosclerosis.  There are recurrent patchy perihilar and lower lobe pulmonary opacities bilaterally most consistent with edema.  A component of the left perihilar density corresponds with calcified pleural plaque on CT.  No significant pleural effusion is identified.  Osseous structures are stable.  IMPRESSION: Recurrent diffuse bilateral pulmonary opacities most consistent with edema.  Calcified pleural plaque on the left.  Original Report Authenticated By: Gerrianne Scale, M.D.   Medications: I have reviewed the patient's current medications. Scheduled Meds:   . acetaminophen  650 mg Oral Once  . cefTRIAXone (ROCEPHIN)  IV  1 g Intravenous QHS  . levofloxacin (LEVAQUIN) IV  500 mg Intravenous Q48H  . levofloxacin (LEVAQUIN) IV  750 mg Intravenous NOW  . oseltamivir  75 mg Oral BID  . sodium chloride  1,000 mL Intravenous Once  . sodium chloride  1,000 mL Intravenous Once  . sodium chloride  1,000 mL Intravenous Once  . vancomycin  1,500 mg Intravenous Once  . DISCONTD: digoxin  0.25 mg Intravenous Q6H  . DISCONTD: digoxin  0.5 mg Intravenous Once  . DISCONTD: heparin  5,000 Units Subcutaneous Q8H  . DISCONTD: levofloxacin (LEVAQUIN) IV  750 mg Intravenous Q24H  . DISCONTD: sodium chloride  1,000 mL Intravenous Once   Continuous Infusions:   . DISCONTD: sodium chloride 1,000 mL (03/23/11 2059)  . DISCONTD: sodium chloride     PRN Meds:.acetaminophen, acetaminophen, ondansetron (ZOFRAN) IV, ondansetron Assessment/Plan: 1. Gastroenteritis - probably causing his dehydration and acute renal failure. Current concern for sepsis and PCCM is managing. CT of head was negative for stroke in ED and speech was normal in ED and is currently normal. No concern at this time for acute  stroke as better medical explanation. Getting a flu swab to rule out influenza. If positive could account for some of his weakness vs from his repeated bouts of diarrhea and vomiting. Neurologically stable and we will sign off for now.  2. Please call with questions.      LOS: 1 day   Genella Mech 03/24/2011, 8:48 AM

## 2011-03-24 NOTE — Procedures (Signed)
Bronchoscopy Procedure Note SADARIUS NORMAN [id number] 1926/04/02  Procedure: Bronchoscopy Indications: Diagnostic evaluation of the airways  Procedure Details Consent: Risks of procedure as well as the alternatives and risks of each were explained to the (patient/caregiver).  Consent for procedure obtained. Time Out: Verified patient identification, verified procedure, site/side was marked, verified correct patient position, special equipment/implants available, medications/allergies/relevent history reviewed, required imaging and test results available.  Performed  In preparation for procedure, patient was given 100% FiO2 and bronchoscope lubricated. Sedation: Etomidate  Airway entered and the following bronchi were examined: RUL, RML, RLL, LUL, LLL and Bronchi.   Procedures performed: Brushings performed Bronchoscope removed.    Evaluation Hemodynamic Status: BP stable throughout; O2 sats: stable throughout Patient's Current Condition: stable Specimens:  Sent serosanguinous fluid Complications: No apparent complications Patient did tolerate procedure well.   Etiology? In setting rapid declines  Findings: 1. Mild scant bloody airway rt BI 2. BAL RML, lingula, blood tinged secretions 3. No mass lesions 4. ETT WNL  Dennie Vecchio J. 03/24/2011

## 2011-03-24 NOTE — Procedures (Signed)
Arterial Catheter Insertion Procedure Note Richard Reed [id number] [birth year]  Procedure: Insertion of Arterial Catheter  Indications: Blood pressure monitoring  Procedure Details Consent: Risks of procedure as well as the alternatives and risks of each were explained to the (patient/caregiver).  Consent for procedure obtained. Time Out: Verified patient identification, verified procedure, site/side was marked, verified correct patient position, special equipment/implants available, medications/allergies/relevent history reviewed, required imaging and test results available.  Performed  Maximum sterile technique was used including antiseptics, cap, gloves, gown, hand hygiene and mask. Skin prep: Chlorhexidine; local anesthetic administered 20 gauge catheter was inserted into right radial artery using the Seldinger technique.  Evaluation Blood flow good; BP tracing good. Complications: No apparent complications Site dressed per protocol, secured.  Line zeroed and B/P 104/54.   Richard Reed 03/24/2011

## 2011-03-24 NOTE — Progress Notes (Signed)
CRITICAL VALUE ALERT  Critical value received:  cbg 67  Date of notification:  03/24/11  Time of notification:  1950  Critical value read back:yes  Nurse who received alert:  Carlyon Prows  MD notified (1st page):  Dr. Marchelle Gearing  Time of first page:  1951  MD notified (2nd page):  Time of second page:  Responding MD:  Dr. Marchelle Gearing  Time MD responded:  [phone]

## 2011-03-24 NOTE — Procedures (Signed)
Central Venous Catheter Insertion Procedure Note Richard Reed [id number] April 06, 1926  Procedure: Insertion of Central Venous Catheter Indications: Assessment of intravascular volume  Procedure Details Consent: Risks of procedure as well as the alternatives and risks of each were explained to the (patient/caregiver).  Consent for procedure obtained. Time Out: Verified patient identification, verified procedure, site/side was marked, verified correct patient position, special equipment/implants available, medications/allergies/relevent history reviewed, required imaging and test results available.  Performed  Maximum sterile technique was used including antiseptics, cap, gloves, gown, hand hygiene, mask and sheet. Skin prep: Chlorhexidine; local anesthetic administered A antimicrobial bonded/coated triple lumen catheter was placed in the right internal jugular vein using the Seldinger technique.  Evaluation Blood flow good Complications: No apparent complications Patient did tolerate procedure well. Chest X-ray ordered to verify placement.  CXR: pending.  Richard Reed 03/24/2011, 9:57 AM  Blood loss less 1 cc With Richard Reed, ACNP [place] [facility]

## 2011-03-24 NOTE — Progress Notes (Signed)
eLink Physician-Brief Progress Note Patient Name: Richard Reed DOB: 1926-03-08 MRN: [id number]  Date of Service  03/24/2011   HPI/Events of Note   agitatiion despite fent gtt at 279mcg/h  eICU Interventions  Versed x 1 bolus Haldol 5mg  x 1 bolus IV (RN says Qtc 400-449msec) REassess after above   Intervention Category Major Interventions: Delirium, psychosis, severe agitation - evaluation and management  Lyann Hagstrom 03/24/2011, 7:17 PM

## 2011-03-24 NOTE — Procedures (Signed)
Intubation Procedure Note Richard Reed [id number] 11/16/1925  Procedure: Intubation Indications: Respiratory insufficiency  Procedure Details Consent: Risks of procedure as well as the alternatives and risks of each were explained to the (patient/caregiver).  Consent for procedure obtained. Time Out: Verified patient identification, verified procedure, site/side was marked, verified correct patient position, special equipment/implants available, medications/allergies/relevent history reviewed, required imaging and test results available.  Performed  Maximum sterile technique was used including gown, hand hygiene and mask.  MAC   Evaluation Hemodynamic Status: BP stable throughout; O2 sats: stable throughout Patient's Current Condition: unstable Complications: No apparent complications Patient did tolerate procedure well. Chest X-ray ordered to verify placement.  CXR: pending.  Mod anterior airway  Richard Reed. 03/24/2011

## 2011-03-24 NOTE — Progress Notes (Signed)
CRITICAL VALUE ALERT  Critical value received:  Ca 6.5, CKMB 6.3, Troponin 1.55  Date of notification:  03/24/11  Time of notification: 2355  Critical value read back:yes  Nurse who received alert:  Doren Custard RN   MD notified (1st page): Dr. Bea Laura Deterding  Time of first page:  03/24/11  MD notified (2nd page): E Deterding  Time of second page:  Responding MD:  Bea Laura Deterding    Time MD responded:  12/6 2355

## 2011-03-24 NOTE — Progress Notes (Signed)
eLink Physician-Brief Progress Note Patient Name: Richard Reed DOB: 12-13-1925 MRN: [id number]  Date of Service  03/24/2011   HPI/Events of Note   3:3pm mag 0.7  eICU Interventions  Replete mag   Intervention Category Major Interventions: Electrolyte abnormality - evaluation and management  [name] 03/24/2011, 7:22 PM

## 2011-03-24 NOTE — Progress Notes (Signed)
Pulsatile blood flow from catheter site.  Addressed with MD, decision made to pull Arterial Line.  Pressure held X10 minutes.  Site cleaned and bandaged with pressure dressing.

## 2011-03-24 NOTE — H&P (Signed)
I interviewed and examined this patient and discussed the care plan with Dr.Corey and the FPTS team and agree with assessment and plan as documented in the admission note for last evening. The history is difficulty to obtain due to his poor hearing. He is working hard to keep up his oxygen level on 4 liters. I'm sure fluid overload is part of the current problem, and with his low blood pressure at times and A.Fib with tachycardia, I agree with starting Digoxin. Cardiology can determine whether to continue that. Agree with CCM consult since he is at risk for decompensating due to fatigue.     Richard Reed A. Sheffield Slider, MD Family Medicine Teaching Service Attending  03/24/2011 8:41 AM

## 2011-03-24 NOTE — ED Provider Notes (Signed)
I saw and evaluated the patient, reviewed the resident's note and I agree with the findings and plan.   Dione Booze, MD 03/24/11 1051

## 2011-03-24 NOTE — Consult Note (Signed)
Name: Richard Reed MRN: [id number] DOB: 1926/03/19    LOS: 1  PCCM ADMISSION NOTE  History of Present Illness:  77 year old male admitted on 12/5 w/ one day h/o N/V/D. In ER found to be hypotensive, with acute on chronic renal failure, thrombocytopenic, and had bilateral pulm infiltrates. His BP responded to 3 IVF bolus. He was started on empiric abx. Admitted to SDU. From 12/5 to 12/6 he developed progressive increase in WOB. PCCM asked to eval.   Lines / Drains: Right IJ 12/6>>>  Cultures: mrsa PCR 12/5: neg bcx2 12/6>>> Urine strep 12/6>>> Urine legionella 12/6>>> procalcitonin 12/6>>> Lactic acid 12/6>>>3.3>>> Influenza PCR 12/6>>> stool culture 12/6>>> C diff PCR 12/6>>>  Antibiotics: Rocephin 12/5>>> vanc 12/5>>> levaquin 12/6>>> tamiflu 12/6>>>  Tests / Events:  HPI This is a 89 male who presented to the ER on 12/5 with CC: Weakness, vomiting, and diarrhea X 1 day. He reports going to dinner on 12/4 around 5 PM and having oyster soup. At 8 PM that night patient began having mild episodes of vomiting and diarrhea. No hematemesis hematochezia was noted. After several prolonged episodes patient went to bed and reports sleeping well but felt very weak and with chills. On the morning patient awoke ate some dry cereal, banana, and some ginger ale and then had another bout of emesis. Patient back to bed and has had nothing else by mouth. Presented to the ER hypotensive and tachycardic, dx eval demonstrated acute on chronic renal failure, thrombocytopenia, and metabolic acidosis . Treated with 3 liters NS, empiric vanc and rocephin. Although his BP responded to Volume His WOB increased. On 12/6 he developed acute resp distress which was the reason for PCCM consult. His CXR from admit demonstrated bilateral pulmonary infiltrates.     Past Medical History  Diagnosis Date  . Irregular heart rate   . Kidney stones   . Hard of hearing   . CKD (chronic kidney disease)   . HTN  (hypertension)   . Hiatal hernia    History reviewed. No pertinent past surgical history. Prior to Admission medications   Medication Sig Start Date End Date Taking? Authorizing Provider  metoprolol succinate (TOPROL-XL) 25 MG 24 hr tablet Take 25 mg by mouth daily.     Yes Historical Provider, MD   Allergies No Known Allergies  Family History Family History  Problem Relation Age of Onset  . Heart failure Mother     Social History  reports that he has never smoked. He has never used smokeless tobacco. He reports that he does not drink alcohol or use illicit drugs.  Review Of Systems  11 points review of systems is negative with an exception of listed in HPI.  Vital Signs: Temp:  [97.1 F (36.2 C)-101.4 F (38.6 C)] 97.1 F (36.2 C) (12/06 0800) Pulse Rate:  [61-125] 124  (12/06 0800) Resp:  [16-30] 30  (12/06 0800) BP: (80-138)/(38-81) 122/68 mmHg (12/06 0800) SpO2:  [93 %-99 %] 99 % (12/06 0800) Weight:  [74.4 kg (164 lb 0.4 oz)] 164 lb 0.4 oz (74.4 kg) (12/05 2138) I/O last 3 completed shifts: In: 4500 [I.V.:4000; IV Piggyback:500] Out: 450 [Urine:450]    Physical Examination: General:  77 year old male currently in mild acute distress.  Neuro:  Awake. Does have mild disorientation. Very hard of hearing.  HEENT:  Mucous membranes dry, no adenopathy, Neck veins flat at 30 degrees Cardiovascular: RRR, no MRG, has occational PAC on tele Lungs:  Decreased, Positive accessory muscle use  Abdomen:  Soft, non-tender Musculoskeletal:  No edema, 2+ pulses, brisk CR Skin: red petechial rash on stomach/chest.   Ventilator settings:    Labs and Imaging:  Reviewed.  Please refer to the Assessment and Plan section for relevant results.  Assessment and Plan:  Acute respiratory failure in the setting of bilateral pulmonary infiltrates. Diff dx include: CAP, viral PNA, ALI or edema.  Plan: -transduce CVP to evaluate volume status -titrate FIO2 for sats > 92 -close follow up  PCXR and ABG -add scheduled BD -high risk for intubation, will add bicarb for NON AG mild process -rx possible PNA Repeat abg  SIRS/Sepsis. Diff dx influenza, CAP, viral PNA, also consider gastroenteritis and evolving ALI.  Plan: -urine antigen studies, Procalcitonin, BNP, influenza antigen -widen CAP coverage with addition atypical -add influenza coverage -cental access -MAP goal > 65, CVP 8-12 -check lactate repeat , trend, may meet criteria occult sepsis with need egdt -hold antihypertensives If repeat lactic - 4,  = add EGDT  Tachycardia w/ frequent PACs and brief episodes of AF. Currently normotensive. Initially think both SIRS/sepsis and volume depletion. Currently NS on monitor w/ PACs Plan -evaluate volume status -may need neo instead of levophed if needs pressors.  -tele monitoring -cards notified, followed by Erlene Quan Dc digoxin Last cath reviewed Lytes for pvc  Acute on chronic renal failure stage III (moderate), baseline scr in 2 range. Prod d/t volume loss. Slow improvement.  Recent Labs  Basename 03/24/11 0415 03/24/11 0020 03/23/11 1609   CREATININE 3.35* 3.41* 4.10*  plan: -central access needed, cvp will guide therpay -IVF as indicated -f/u chemistry  Metabolic acidosis, both gap/non-gap picture. May be d/t some degree of bicarb loss w/ diarrhea.   Lab 03/24/11 0415 03/24/11 0020 03/23/11 1554  CO2 18* 18* 18*  plan: -check lactate trend -change MIVF to Bicarb for losses stool x 1 liter -close obs of chemistry, in pm  Diarrhea, nausea and vomiting all c/w viral Gastroenteritis, also consider enteric pathogens Plan: -send c diff and stool cultures Low threshold add flagyl Npo lft   Encephalopathy, Ha   Thrombocytopenia  Lab 03/24/11 0020 03/23/11 1554  PLT 47* 50*  treat sepsis, scd No hep    Hard of hearing May need consult hearing aide  Best practices / Disposition: -->ICU status under PCCM -->full code -->Heparin for DVT  Px -->Protonix for GI Px -->ventilator bundle  The patient is critically ill with multiple organ systems failure and requires high complexity decision making for assessment and support, frequent evaluation and titration of therapies, application of advanced monitoring technologies and extensive interpretation of multiple databases. Critical Care Time devoted to patient care services described in this note is  60 minutes.  BABCOCK,PETE 03/24/2011, 8:48 AM  Mcarthur Rossetti. Tyson Alias, MD, FACP Pgr: [phone] [facility]

## 2011-03-24 NOTE — Progress Notes (Signed)
Interim progress note: Overnight blood pressure responded to fluids, however he developed signs or fluid overload with tachypnea. Fluids were stopped overnight. Additionally overnight we developed suspicion for sepsis and obtained blood cultures and started ceftriaxone and vancomycin. By the morning his BP was well but his tachycardia and tachypnea persisted and worsened.  Tachycardia likely due to pAfib. Digoxin was started and [facility] cardiology was consulted.  Tachypnea was evaluated with ABG which showed partially compensated metabolic acidosis.  Will consult pCCM.  Have discussed this case with Dr. Sheffield Slider.  Will follow closely today.

## 2011-03-25 ENCOUNTER — Inpatient Hospital Stay (HOSPITAL_COMMUNITY): Payer: Medicare Other

## 2011-03-25 DIAGNOSIS — R7881 Bacteremia: Secondary | ICD-10-CM

## 2011-03-25 DIAGNOSIS — R6521 Severe sepsis with septic shock: Secondary | ICD-10-CM

## 2011-03-25 DIAGNOSIS — J9600 Acute respiratory failure, unspecified whether with hypoxia or hypercapnia: Secondary | ICD-10-CM

## 2011-03-25 DIAGNOSIS — N179 Acute kidney failure, unspecified: Secondary | ICD-10-CM

## 2011-03-25 DIAGNOSIS — A419 Sepsis, unspecified organism: Secondary | ICD-10-CM

## 2011-03-25 LAB — DIFFERENTIAL
Basophils Absolute: 0 10*3/uL (ref 0.0–0.1)
Blasts: 0 %
Eosinophils Absolute: 0 10*3/uL (ref 0.0–0.7)
Eosinophils Relative: 0 % (ref 0–5)
Lymphs Abs: 0.3 10*3/uL — ABNORMAL LOW (ref 0.7–4.0)
Metamyelocytes Relative: 4 %
Monocytes Absolute: 0.5 10*3/uL (ref 0.1–1.0)
Monocytes Relative: 3 % (ref 3–12)
Myelocytes: 1 %
Neutro Abs: 16.4 10*3/uL — ABNORMAL HIGH (ref 1.7–7.7)
Neutrophils Relative %: 55 % (ref 43–77)
nRBC: 0 /100 WBC

## 2011-03-25 LAB — COMPREHENSIVE METABOLIC PANEL
AST: 65 U/L — ABNORMAL HIGH (ref 0–37)
CO2: 13 mEq/L — ABNORMAL LOW (ref 19–32)
Calcium: 6.7 mg/dL — ABNORMAL LOW (ref 8.4–10.5)
Creatinine, Ser: 3.4 mg/dL — ABNORMAL HIGH (ref 0.50–1.35)
GFR calc Af Amer: 18 mL/min — ABNORMAL LOW (ref >90)
GFR calc non Af Amer: 15 mL/min — ABNORMAL LOW (ref >90)
Glucose, Bld: 159 mg/dL — ABNORMAL HIGH (ref 70–99)

## 2011-03-25 LAB — CBC
Hemoglobin: 13.7 g/dL (ref 13.0–17.0)
MCH: 31.5 pg (ref 26.0–34.0)
Platelets: 22 10*3/uL — CL (ref 150–400)
Platelets: 20 10*3/uL — CL (ref 150–400)
RBC: 4.06 MIL/uL — ABNORMAL LOW (ref 4.22–5.81)
RBC: 4.21 MIL/uL — ABNORMAL LOW (ref 4.22–5.81)
RDW: 14 % (ref 11.5–15.5)
RDW: 14.2 % (ref 11.5–15.5)
WBC: 9.7 10*3/uL (ref 4.0–10.5)

## 2011-03-25 LAB — GLUCOSE, CAPILLARY
Glucose-Capillary: 167 mg/dL — ABNORMAL HIGH (ref 70–99)
Glucose-Capillary: 140 mg/dL — ABNORMAL HIGH (ref 70–99)

## 2011-03-25 LAB — CARDIAC PANEL(CRET KIN+CKTOT+MB+TROPI)
Relative Index: 2.9 — ABNORMAL HIGH (ref 0.0–2.5)
Total CK: 934 U/L — ABNORMAL HIGH (ref 7–232)
Total CK: 1060 U/L — ABNORMAL HIGH (ref 7–232)
Troponin I: 0.73 ng/mL (ref <0.30)

## 2011-03-25 LAB — C-REACTIVE PROTEIN: CRP: 34.34 mg/dL — ABNORMAL HIGH (ref <0.60)

## 2011-03-25 LAB — LEGIONELLA ANTIGEN, URINE: Legionella Antigen, Urine: NEGATIVE

## 2011-03-25 LAB — DIC (DISSEMINATED INTRAVASCULAR COAGULATION)PANEL
Fibrinogen: 383 mg/dL (ref 204–475)
Platelets: 22 10*3/uL — CL (ref 150–400)
Smear Review: NONE SEEN

## 2011-03-25 LAB — CORTISOL: Cortisol, Plasma: 23.5 ug/dL

## 2011-03-25 LAB — PATHOLOGIST SMEAR REVIEW

## 2011-03-25 LAB — PROCALCITONIN: Procalcitonin: 91.53 ng/mL

## 2011-03-25 MED ORDER — STUDY - INVESTIGATIONAL DRUG SIMPLE RECORD
40.0000 mg | Freq: Once | Status: AC
  Administered 2011-03-25: 40 mg via ORAL
  Filled 2011-03-25: qty 40

## 2011-03-25 MED ORDER — CEFTAZIDIME 2 G IJ SOLR
2.0000 g | INTRAMUSCULAR | Status: AC
  Administered 2011-03-25: 2 g via INTRAVENOUS
  Filled 2011-03-25: qty 2

## 2011-03-25 MED ORDER — CHLORHEXIDINE GLUCONATE 0.12 % MT SOLN
15.0000 mL | Freq: Two times a day (BID) | OROMUCOSAL | Status: DC
  Administered 2011-03-25 – 2011-03-28 (×6): 15 mL via OROMUCOSAL
  Filled 2011-03-25 (×6): qty 15

## 2011-03-25 MED ORDER — SODIUM CHLORIDE 0.9 % IV SOLN
INTRAVENOUS | Status: DC
Start: 2011-03-25 — End: 2011-03-29
  Administered 2011-03-25: 20 mL/h via INTRAVENOUS
  Administered 2011-03-26: 500 mL via INTRAVENOUS
  Administered 2011-03-28: 250 mL via INTRAVENOUS
  Administered 2011-03-29: 06:00:00 via INTRAVENOUS

## 2011-03-25 MED ORDER — ASPIRIN 300 MG RE SUPP
300.0000 mg | Freq: Every day | RECTAL | Status: DC
  Administered 2011-03-25 – 2011-03-27 (×3): 300 mg via RECTAL
  Filled 2011-03-25 (×5): qty 1

## 2011-03-25 MED ORDER — FUROSEMIDE 10 MG/ML IJ SOLN: 20.0000 mg | INTRAMUSCULAR | Status: DC | PRN

## 2011-03-25 MED ORDER — PANTOPRAZOLE SODIUM 40 MG IV SOLR
40.0000 mg | Freq: Every day | INTRAVENOUS | Status: DC
  Administered 2011-03-25 – 2011-03-27 (×3): 40 mg via INTRAVENOUS
  Filled 2011-03-25 (×4): qty 40

## 2011-03-25 MED ORDER — STUDY - INVESTIGATIONAL DRUG SIMPLE RECORD: 10.0000 mg | Freq: Every day | Status: DC

## 2011-03-25 MED ORDER — STUDY - INVESTIGATIONAL DRUG SIMPLE RECORD: 20.0000 mg | Freq: Every day | Status: DC

## 2011-03-25 MED ORDER — BIOTENE DRY MOUTH MT LIQD
15.0000 mL | Freq: Four times a day (QID) | OROMUCOSAL | Status: DC
  Administered 2011-03-25 – 2011-03-28 (×10): 15 mL via OROMUCOSAL

## 2011-03-25 MED ORDER — DEXTROSE 5 % IV SOLN
2.0000 g | INTRAVENOUS | Status: DC
  Administered 2011-03-25 – 2011-03-27 (×3): 2 g via INTRAVENOUS
  Filled 2011-03-25 (×4): qty 2

## 2011-03-25 MED ORDER — SODIUM BICARBONATE 8.4 % IV SOLN
INTRAVENOUS | Status: DC
  Administered 2011-03-25 – 2011-03-26 (×5): via INTRAVENOUS
  Filled 2011-03-25 (×7): qty 100

## 2011-03-25 MED ORDER — PIPERACILLIN-TAZOBACTAM IN DEX 2-0.25 GM/50ML IV SOLN
2.2500 g | Freq: Four times a day (QID) | INTRAVENOUS | Status: DC
Start: 2011-03-25 — End: 2011-03-25
  Administered 2011-03-25 (×3): 2.25 g via INTRAVENOUS
  Filled 2011-03-25 (×8): qty 50

## 2011-03-25 MED ORDER — SODIUM CHLORIDE 0.9 % IJ SOLN
INTRAMUSCULAR | Status: AC
  Administered 2011-03-25: 10 mL
  Filled 2011-03-25: qty 20

## 2011-03-25 MED ORDER — STUDY - INVESTIGATIONAL DRUG SIMPLE RECORD
10.0000 mg | Freq: Every day | Status: DC
  Filled 2011-03-25: qty 10

## 2011-03-25 NOTE — Progress Notes (Signed)
ANTIBIOTIC CONSULT NOTE - INITIAL  Pharmacy Consult for Fortaz/Zosyn Indication: rule out sepsis  No Known Allergies  Patient Measurements: Height: 5\' 10"  (177.8 cm) Weight: 164 lb 0.4 oz (74.4 kg) IBW/kg (Calculated) : 73   Vital Signs: Temp: 100.9 F (38.3 C) (12/06 1500) BP: 97/53 mmHg (12/07 0005) Pulse Rate: 113  (12/07 0005)  Labs:  Basename 03/24/11 2250 03/24/11 0415 03/24/11 0020 03/23/11 1609 03/23/11 1554  WBC 9.7 -- 12.1* -- 11.7*  HGB 12.8* -- 14.7 14.6 --  PLT 22*PENDING -- 47* -- 50*  LABCREA -- -- -- -- --  CREATININE 3.30*3.38* 3.35* 3.41* -- --   Estimated Creatinine Clearance: 17.5 ml/min (by C-G formula based on Cr of 3.3).  Microbiology: Recent Results (from the past 720 hour(s))  MRSA PCR SCREENING     Status: Normal   Collection Time   03/23/11  9:38 PM      Component Value Range Status Comment   MRSA by PCR NEGATIVE  NEGATIVE  Final   CLOSTRIDIUM DIFFICILE BY PCR     Status: Normal   Collection Time   03/24/11  9:14 AM      Component Value Range Status Comment   C difficile by pcr NEGATIVE  NEGATIVE  Final   CULTURE, BLOOD (ROUTINE X 2)     Status: Normal (Preliminary result)   Collection Time   03/24/11 10:30 AM      Component Value Range Status Comment   Specimen Description BLOOD CENTRAL LINE   Final    Special Requests BOTTLES DRAWN AEROBIC AND ANAEROBIC 10CC RT IJ   Final    Setup Time [id number]   Final    Culture     Final    Value: GRAM NEGATIVE RODS     Note: Gram Stain Report Called to,Read Back By and Verified With: MARCELLA HAIRSTON @ 0020 ON 03/25/11 BY GOLLD   Report Status PENDING   Incomplete   TECHNOLOGIST SMEAR REVIEW     Status: Normal   Collection Time   03/24/11  3:30 PM      Component Value Range Status Comment   Path Review INCREASED BANDS (>20% BANDS)   Final   CULTURE, BAL-QUANTITATIVE     Status: Normal (Preliminary result)   Collection Time   03/24/11  3:31 PM      Component Value Range Status Comment   Specimen Description BRONCHIAL ALVEOLAR LAVAGE   Final    Special Requests NONE   Final    Gram Stain     Final    Value: RARE WBC PRESENT,BOTH PMN AND MONONUCLEAR     NO SQUAMOUS EPITHELIAL CELLS SEEN     NO ORGANISMS SEEN   Colony Count PENDING   Incomplete    Culture PENDING   Incomplete    Report Status PENDING   Incomplete    Medical History: Past Medical History  Diagnosis Date  . Irregular heart rate   . Kidney stones   . Hard of hearing   . CKD (chronic kidney disease)   . HTN (hypertension)   . Hiatal hernia    Medications:  Prescriptions prior to admission  Medication Sig Dispense Refill  . metoprolol succinate (TOPROL-XL) 25 MG 24 hr tablet Take 25 mg by mouth daily.         Scheduled:    . aspirin  300 mg Rectal Daily  . cefTAZidime (FORTAZ)  IV  2 g Intravenous NOW  . cefTAZidime (FORTAZ)  IV  2 g Intravenous Q24H  .  dextrose      . dextrose      . gelatin adsorbable   Topical Once  . haloperidol lactate      . haloperidol lactate  5 mg Intravenous Once  . hydrocortisone sod succinate (SOLU-CORTEF) injection  50 mg Intravenous Q6H  . levofloxacin (LEVAQUIN) IV  500 mg Intravenous Q48H  . levofloxacin (LEVAQUIN) IV  750 mg Intravenous NOW  . magnesium sulfate infusion  2 g Intravenous Once  . midazolam      . midazolam      . midazolam      . oseltamivir  75 mg Oral BID  . piperacillin-tazobactam (ZOSYN)  IV  2.25 g Intravenous Q6H  . sodium chloride  1,000 mL Intravenous Once  . sodium chloride      . sodium chloride      . vancomycin  1,500 mg Intravenous Once  . DISCONTD: cefTRIAXone (ROCEPHIN)  IV  1 g Intravenous QHS  . DISCONTD: digoxin  0.25 mg Intravenous Q6H  . DISCONTD: digoxin  0.5 mg Intravenous Once  . DISCONTD: heparin  5,000 Units Subcutaneous Q8H  . DISCONTD: levofloxacin (LEVAQUIN) IV  750 mg Intravenous Q24H  . DISCONTD: propofol       Infusions:    . dextrose 50 mL/hr at 03/24/11 2011  . fentaNYL infusion INTRAVENOUS 100 mcg/hr  (03/24/11 1800)  . phenylephrine (NEO-SYNEPHRINE) Adult infusion 110 mcg/min (03/25/11 0027)  . propofol 67.204 mcg/kg/min (03/24/11 2011)  . DISCONTD: sodium chloride    . DISCONTD: midazolam (VERSED) infusion    . DISCONTD: phenylephrine (NEO-SYNEPHRINE) Adult infusion 3.333 mcg/min (03/24/11 1800)  . DISCONTD: propofol Stopped (03/24/11 1745)   Assessment: 77yo male w/ CKD shows GNR in Bld Cx, broad coverage required, on vanc and Levaquin, had been on Rocephin, to change to [place] and Zosyn.  Plan:  Will begin Zosyn 2.25g IV Q6H and Fortaz 2g IV Q24H and continue to monitor SCr, Cx, and CBC.  Colleen Can PharmD BCPS 03/25/2011,1:08 AM

## 2011-03-25 NOTE — Progress Notes (Signed)
Patient FLUPCR negative. BC showed gram negative rods, but no growth to date. MD consulted/infection prevention. Patient taken off of all contact precautions. Family eduction done.  Minus Liberty, RN

## 2011-03-25 NOTE — Research (Signed)
SAILS: Statins for Acutely Injured Lungs from Sepsis discussed with patient's wife.  All questions and concerns were addressed and answered prior to obtaining informed consent.  No study procedures were initiated prior to obtaining consent. A copy of the signed consent was provided to family.  Please contact CCM physician or study coordinator for any questions or concerns.  The patient is currently in the shock state and therefore no FACCT fluid management will take place until off of pressors for 12 hours or 12 hrs after last fluid bolus.

## 2011-03-25 NOTE — Progress Notes (Signed)
eLink Physician-Brief Progress Note Patient Name: Richard Reed DOB: [birth year] MRN: [id number]  Date of Service  03/25/2011   HPI/Events of Note  Report of GNR in blood culture in both aerobic and anaerobic bottles drawn in AM at 12/6.  On levoquin/rocephin and received vanc earlier.  Also with elevated trop1   eICU Interventions  1. Need to broadly cover so will cont levoquin but d/c rocephin and start fortaz and zosyn. 2. Cycle cardiac enzymes 3. Add ASA to med regimen   Intervention Category Major Interventions: Shock - evaluation and management  DETERDING,ELIZABETH 03/25/2011, 12:41 AM

## 2011-03-25 NOTE — Progress Notes (Signed)
ANTIBIOTIC CONSULT NOTE - FOLLOW UP  Pharmacy Consult for Fortaz/Levaquin Indication: GNR sepsis  No Known Allergies  Patient Measurements: Height: 5\' 10"  (177.8 cm) Weight: 164 lb 0.4 oz (74.4 kg) IBW/kg (Calculated) : 73    Vital Signs: Temp: 98.2 F (36.8 C) (12/07 1200) Temp src: Oral (12/07 1200) BP: 106/49 mmHg (12/07 1400) Pulse Rate: 45  (12/07 1400) Intake/Output from previous day: 12/06 0701 - 12/07 0700 In: 3203.6 [I.V.:2790.4; NG/GT:30; IV Piggyback:383.2] Out: 797 [Urine:797] Intake/Output from this shift: Total I/O In: 1094.2 [I.V.:1044.2; IV Piggyback:50] Out: 590 [Urine:590]  Labs:  [facility] 03/25/11 0630 03/24/11 2250 03/24/11 0415 03/24/11 0020  WBC 17.2* 9.7 -- 12.1*  HGB 13.7 12.8* -- 14.7  PLT 20* 22*22* -- 47*  LABCREA -- -- -- --  CREATININE 3.40* 3.30*3.38* 3.35* --   Estimated Creatinine Clearance: 17 ml/min (by C-G formula based on Cr of 3.4).    Microbiology: Recent Results (from the past 720 hour(s))  MRSA PCR SCREENING     Status: Normal   Collection Time   03/23/11  9:38 PM      Component Value Range Status Comment   MRSA by PCR NEGATIVE  NEGATIVE  Final   CLOSTRIDIUM DIFFICILE BY PCR     Status: Normal   Collection Time   03/24/11  9:14 AM      Component Value Range Status Comment   C difficile by pcr NEGATIVE  NEGATIVE  Final   STOOL CULTURE     Status: Normal (Preliminary result)   Collection Time   03/24/11  9:14 AM      Component Value Range Status Comment   Specimen Description STOOL   Final    Special Requests NONE   Final    Culture Culture reincubated for better growth   Final    Report Status PENDING   Incomplete   CULTURE, BLOOD (ROUTINE X 2)     Status: Normal (Preliminary result)   Collection Time   03/24/11 10:30 AM      Component Value Range Status Comment   Specimen Description BLOOD CENTRAL LINE   Final    Special Requests BOTTLES DRAWN AEROBIC AND ANAEROBIC 10CC RT IJ   Final    Setup Time [id number]    Final    Culture     Final    Value: GRAM NEGATIVE RODS     Note: Gram Stain Report Called to,Read Back By and Verified With: MARCELLA HAIRSTON @ 0020 ON 03/25/11 BY GOLLD   Report Status PENDING   Incomplete   CULTURE, BLOOD (ROUTINE X 2)     Status: Normal (Preliminary result)   Collection Time   03/24/11 10:30 AM      Component Value Range Status Comment   Specimen Description BLOOD ARM LEFT   Final    Special Requests BOTTLES DRAWN AEROBIC AND ANAEROBIC [facility]   Final    Setup Time [id number]   Final    Culture     Final    Value: GRAM NEGATIVE RODS     Note: Gram Stain Report Called to,Read Back By and Verified With: MISTY REAGAN @0135  ON 03/25/11 BY MCLET   Report Status PENDING   Incomplete   TECHNOLOGIST SMEAR REVIEW     Status: Normal   Collection Time   03/24/11  3:30 PM      Component Value Range Status Comment   Path Review INCREASED BANDS (>20% BANDS)   Final   CULTURE, BAL-QUANTITATIVE     Status:  Normal (Preliminary result)   Collection Time   03/24/11  3:31 PM      Component Value Range Status Comment   Specimen Description BRONCHIAL ALVEOLAR LAVAGE   Final    Special Requests NONE   Final    Gram Stain     Final    Value: RARE WBC PRESENT,BOTH PMN AND MONONUCLEAR     NO SQUAMOUS EPITHELIAL CELLS SEEN     NO ORGANISMS SEEN   Colony Count PENDING   Incomplete    Culture NO GROWTH 1 DAY   Final    Report Status PENDING   Incomplete   FUNGUS CULTURE W SMEAR     Status: Normal (Preliminary result)   Collection Time   03/24/11  3:32 PM      Component Value Range Status Comment   Specimen Description BRONCHIAL ALVEOLAR LAVAGE   Final    Special Requests NONE   Final    Fungal Smear NO YEAST OR FUNGAL ELEMENTS SEEN   Final    Culture CULTURE IN PROGRESS FOR FOUR WEEKS   Final    Report Status PENDING   Incomplete   PATHOLOGIST SMEAR REVIEW     Status: Normal   Collection Time   03/24/11  3:32 PM      Component Value Range Status Comment   Tech Review     Final      Value: ALVEOLAR MACROPHAGE AND OCCASIONAL RESPIRATORY EPITHELIAL CELLS.    Anti-infectives     Start     Dose/Rate Route Frequency Ordered Stop   03/26/11 0900   levofloxacin (LEVAQUIN) IVPB 500 mg        500 mg 100 mL/hr over 60 Minutes Intravenous Every 48 hours 03/24/11 0842     03/25/11 2200   cefTAZidime (FORTAZ) 2 g in dextrose 5 % 50 mL IVPB        2 g 100 mL/hr over 30 Minutes Intravenous Every 24 hours 03/25/11 0057     03/25/11 0100  piperacillin-tazobactam (ZOSYN) IVPB 2.25 g       2.25 g 100 mL/hr over 30 Minutes Intravenous 4 times per day 03/25/11 0057     03/25/11 0100   cefTAZidime (FORTAZ) 2 g in dextrose 5 % 50 mL IVPB        2 g 100 mL/hr over 30 Minutes Intravenous NOW 03/25/11 0057 03/25/11 0244   03/24/11 2359   cefTRIAXone (ROCEPHIN) 1 g in dextrose 5 % 50 mL IVPB  Status:  Discontinued        1 g 100 mL/hr over 30 Minutes Intravenous Daily at bedtime 03/23/11 2306 03/25/11 0040   03/24/11 1000   oseltamivir (TAMIFLU) capsule 75 mg        75 mg Oral 2 times daily 03/24/11 0836     03/24/11 0900   Levofloxacin (LEVAQUIN) IVPB 750 mg        750 mg 100 mL/hr over 90 Minutes Intravenous NOW 03/24/11 0842 03/24/11 1346   03/24/11 0845   Levofloxacin (LEVAQUIN) IVPB 750 mg  Status:  Discontinued     Comments: STAT      750 mg 100 mL/hr over 90 Minutes Intravenous Every 24 hours 03/24/11 0836 03/24/11 0841   03/24/11 0000   vancomycin (VANCOCIN) 1,500 mg in sodium chloride 0.9 % 500 mL IVPB        1,500 mg 250 mL/hr over 120 Minutes Intravenous  Once 03/23/11 2347 03/24/11 0209           Assessment:  77 YO admitted with one day hx n/v/d, now with GNR 2/2 blood cx.  Pharmacy Problem List/Assessment:  Anticoag  None PTA  ID  Afeb, WBC incr. Blood GNR 2/2. Levaquin#2, Fortaz#2. Zosyn, Vanc and tamiflu d/c (flu PCR neg)  Vanc and Zosyn d/c per CCM MD.  Neuro  GCS 13, RASS -1; propofol inf  Pulm  PSV 0.30, FiO2; oral care.  Card  MAP 64, NSR-HR  80-90s; PE at 12mcg/min. On toprol PTA. Stress steroids.  FEN/GI/ENDO  No hx DM, SSI d/c. CBG control fair-stress steroids  Renal  Scr cont to incr. UOP incr from baseline, but still low.  Heme/Onc  WBC incr, Plts low at baseline, lower now-?sepsis. H/H ok.  OB/Gyn  n/a  VTE px, best practices  Oral care meds added; requested SCDs for VTE px (plts 20K),    Goal of Therapy:  Adjust abx based on cx/sens data  Plan: 1. Continue Levaquin 500mg  IV q 48h 2. Continue Fortaz 2gm IV 24h 3. Will monitor cx/sens, renal fn and clinical status daily.  Lucine Bilski K. Allena Katz, PharmD, BCPS.  Clinical Pharmacist Pager [phone]. 03/25/2011 2:55 PM

## 2011-03-25 NOTE — Progress Notes (Signed)
Name: Richard Reed MRN: [id number] DOB: Jan 09, 1926    LOS: 2  PCCM PROGRESS NOTE  PT PROFILE:  77 year old male admitted on 12/5 w/ one day h/o N/V/D. In ER found to be hypotensive, with acute on chronic renal failure, thrombocytopenic, and had bilateral pulm infiltrates. His BP responded to 3 IVF bolus. He was started on empiric abx. Admitted to SDU. From 12/5 to 12/6 he developed progressive increase in WOB. PCCM asked to eval.   Lines / Tubes: Right IJ 12/6 >> ETT 12/6 >>  MICRO:   procalcitonin 12/6: 91.53   mrsa PCR 12/5: neg Urine strep 12/6>>> NEG Urine legionella 12/6>>> NEG Influenza PCR 12/6>>> NEG C diff PCR 12/6>>> NEG stool culture 12/6>>> bcx2 12/6>>> 2/2 GNRs >>   Antibiotics: Rocephin 12/5>>> 12/6 vanc 12/5>>> 12/7 tamiflu 12/6>>> 12/7 Zosyn 12/7 >> 12/7 levaquin 12/6>>> Ceftaz 12/7 >>   Tests / Events  SUBJ: RASS -1. + F/C. Looks very comfortable on 30% FiO2. Remains on pressors  Vital Signs: Temp:  [97.5 F (36.4 C)-100.9 F (38.3 C)] 98.2 F (36.8 C) (12/07 1200) Pulse Rate:  [36-152] 45  (12/07 1400) Resp:  [12-28] 12  (12/07 1400) BP: (66-144)/(33-104) 106/49 mmHg (12/07 1400) SpO2:  [86 %-100 %] 100 % (12/07 1400) Arterial Line BP: (85-129)/(43-85) 129/85 mmHg (12/06 1630) FiO2 (%):  [30 %-100 %] 30 % (12/07 1400) I/O last 3 completed shifts: In: 7703.6 [I.V.:6790.4; NG/GT:30; IV Piggyback:883.2] Out: 1247 [Urine:1247] CVP:  [4 mmHg-313 mmHg] 5 mmHg  Physical Examination: General:  NAD.  Neuro:  No focal deficits  HEENT:  Mucous membranes dry, no adenopathy, Neck veins flat at 30 degrees Cardiovascular: RRR, no MRG Lungs:  Clear anteriiorly Abdomen:  Soft, non-tender Musculoskeletal:  No edema, 2+ pulses, brisk CR  Ventilator settings: Vent Mode:  [-] PSV FiO2 (%):  [30 %-100 %] 30 % Set Rate:  [16 bmp] 16 bmp Vt Set:  [510 mL] 510 mL PEEP:  [5 cmH20] 5 cmH20 Pressure Support:  [5 cmH20-8 cmH20] 8 cmH20 Plateau Pressure:  [9  cmH20-20 cmH20] 9 cmH20  Labs and Imaging:   CXR: NSC B AS dz cw ARDS  BMET    Component Value Date/Time   NA 137 03/25/2011 0630   K 4.1 03/25/2011 0630   CL 109 03/25/2011 0630   CO2 13* 03/25/2011 0630   GLUCOSE 159* 03/25/2011 0630   BUN 65* 03/25/2011 0630   CREATININE 3.40* 03/25/2011 0630   CALCIUM 6.7* 03/25/2011 0630   GFRNONAA 15* 03/25/2011 0630   GFRAA 18* 03/25/2011 0630   CBC    Component Value Date/Time   WBC 17.2* 03/25/2011 0630   RBC 4.21* 03/25/2011 0630   HGB 13.7 03/25/2011 0630   HCT 39.1 03/25/2011 0630   PLT 20* 03/25/2011 0630   MCV 92.9 03/25/2011 0630   MCH 32.5 03/25/2011 0630   MCHC 35.0 03/25/2011 0630   RDW 14.2 03/25/2011 0630   LYMPHSABS 0.3* 03/25/2011 0630   MONOABS 0.5 03/25/2011 0630   EOSABS 0.0 03/25/2011 0630   BASOSABS 0.0 03/25/2011 0630    Assessment and Plan:  Severe sepsis/septic shock: with GNR bacteremia - unclear source. His CC was abdominal sx but abd exam very unimpressive CVP in desired range -Weaning pressors fr MAP > 65 mmHg -Abx adjusted to reflect new cx results. Narrow coverage when final results back -F/U cx data and consider further eval if indicated (?GI source vs UTI - UA unimpressive)  Acute respiratory failure/ARDS: . Secondary to severe  sepsis/septic shock Well supported on current vent settings - cont same Daily WUA/SBT if indicated  Tachycardia w/ frequent PACs and brief episodes of AF. No further AF. Rate better controlled -cards notified, followed by Erlene Quan  Acute on chronic renal failure due to ATN. Baseline scr in 2 range. Nonoliguric. No indication for HD presently Recent Labs  Basename 03/25/11 0630 03/24/11 2250 03/24/11 0415   CREATININE 3.40* 3.30*3.38* 3.35*  Cont to monitor Avoid nephrotoxins Careful attention to volume status  Metabolic acidosis, both gap/non-gap picture. May be d/t some degree of bicarb loss w/ diarrhea.   Lab 03/25/11 0630 03/24/11 2250 03/24/11 0415  CO2 13* 16*16* 18*  Change  IVFs to include HCO3 Monitor renal panel  Diarrhea, nausea and vomiting all c/w viral Gastroenteritis, also consider enteric pathogens C diff PCR negative Stool cx pending  Septic encephalopathy,  Cont sedation protocol   Thrombocytopenia: likely DIC. No acute bleeding  Lab 03/25/11 0630 03/24/11 2250 03/24/11 0020  PLT 20* 22*22* 47*  Monitor Transfuse plts if bleeding No hep  Hard of hearing May need consult hearing aide  Best practices / Disposition: -->ICU status under PCCM -->full code -->SCDs for DVT Px -->Protonix for GI Px -->ventilator bundle  The patient is critically ill with multiple organ systems failure and requires high complexity decision making for assessment and support, frequent evaluation and titration of therapies, application of advanced monitoring technologies and extensive interpretation of multiple databases. Critical Care Time devoted to patient care services described in this note is  45 minutes.  Billy Fischer 03/25/2011, 2:05 PM

## 2011-03-26 ENCOUNTER — Inpatient Hospital Stay (HOSPITAL_COMMUNITY): Payer: Medicare Other

## 2011-03-26 LAB — GLUCOSE, CAPILLARY
Glucose-Capillary: 148 mg/dL — ABNORMAL HIGH (ref 70–99)
Glucose-Capillary: 146 mg/dL — ABNORMAL HIGH (ref 70–99)
Glucose-Capillary: 110 mg/dL — ABNORMAL HIGH (ref 70–99)

## 2011-03-26 LAB — CARDIAC PANEL(CRET KIN+CKTOT+MB+TROPI)
Relative Index: 3 — ABNORMAL HIGH (ref 0.0–2.5)
Total CK: 682 U/L — ABNORMAL HIGH (ref 7–232)
Troponin I: 0.56 ng/mL (ref <0.30)

## 2011-03-26 LAB — CBC
HCT: 36.3 % — ABNORMAL LOW (ref 39.0–52.0)
Hemoglobin: 13 g/dL (ref 13.0–17.0)
MCV: 90.3 fL (ref 78.0–100.0)
WBC: 19.2 10*3/uL — ABNORMAL HIGH (ref 4.0–10.5)

## 2011-03-26 LAB — DIC (DISSEMINATED INTRAVASCULAR COAGULATION)PANEL
D-Dimer, Quant: 10.1 ug/mL-FEU — ABNORMAL HIGH (ref 0.00–0.48)
INR: 1.18 (ref 0.00–1.49)
Platelets: 11 10*3/uL — CL (ref 150–400)
Smear Review: NONE SEEN
aPTT: 30 seconds (ref 24–37)

## 2011-03-26 LAB — COMPREHENSIVE METABOLIC PANEL
ALT: 48 U/L (ref 0–53)
Albumin: 1.9 g/dL — ABNORMAL LOW (ref 3.5–5.2)
Calcium: 6.8 mg/dL — ABNORMAL LOW (ref 8.4–10.5)
GFR calc Af Amer: 23 mL/min — ABNORMAL LOW (ref >90)
Glucose, Bld: 170 mg/dL — ABNORMAL HIGH (ref 70–99)
Potassium: 3.7 mEq/L (ref 3.5–5.1)
Sodium: 137 mEq/L (ref 135–145)
Total Protein: 4.7 g/dL — ABNORMAL LOW (ref 6.0–8.3)

## 2011-03-26 LAB — CULTURE, BAL-QUANTITATIVE W GRAM STAIN

## 2011-03-26 MED ORDER — STUDY - INVESTIGATIONAL DRUG SIMPLE RECORD
20.0000 mg | Freq: Once | Status: AC
  Administered 2011-03-26: 20 mg via ORAL
  Filled 2011-03-26: qty 20

## 2011-03-26 MED ORDER — OXEPA PO LIQD
1000.0000 mL | ORAL | Status: DC
  Filled 2011-03-26 (×2): qty 1000

## 2011-03-26 MED ORDER — OXEPA PO LIQD
1000.0000 mL | ORAL | Status: DC
  Administered 2011-03-26: 1000 mL
  Filled 2011-03-26 (×3): qty 1000

## 2011-03-26 MED ORDER — STUDY - INVESTIGATIONAL DRUG SIMPLE RECORD
20.0000 mg | Freq: Every day | Status: DC
  Administered 2011-03-27 – 2011-03-31 (×5): 20 mg via ORAL
  Filled 2011-03-26 (×3): qty 20

## 2011-03-26 NOTE — Progress Notes (Signed)
Name: Richard Reed MRN: [id number] DOB: [birth year]    LOS: 3  PCCM PROGRESS NOTE  PT PROFILE:  77 year old male admitted on 12/5 w/ one day h/o N/V/D. In ER found to be hypotensive, with acute on chronic renal failure, thrombocytopenic, and had bilateral pulm infiltrates. His BP responded to 3 IVF bolus. He was started on empiric abx. Admitted to SDU. From 12/5 to 12/6 he developed progressive increase in WOB. PCCM asked to eval.   Lines / Tubes: Right IJ 12/6 >> ETT 12/6 >>  MICRO:   procalcitonin 12/6: 91.53   mrsa PCR 12/5: neg Urine strep 12/6>>> NEG Urine legionella 12/6>>> NEG Influenza PCR 12/6>>> NEG C diff PCR 12/6>>> NEG stool culture 12/6>>> bcx2 12/6>>> 2/2 GNRs >>   Antibiotics: Rocephin 12/5>>> 12/6 vanc 12/5>>> 12/7 tamiflu 12/6>>> 12/7 Zosyn 12/7 >> 12/7 levaquin 12/6>>> Ceftaz 12/7 >>   Tests / Events ENrolled in ARDSNET 2 SAILS study statin vs placebo  SUBJ: RASS -1. + F/C. Looks very comfortable on 30% FiO2. Remains on pressors, tol PS 5/5 Now in SAILS trial  Vital Signs: Temp:  [96.9 F (36.1 C)-98.6 F (37 C)] 96.9 F (36.1 C) (12/08 0802) Pulse Rate:  [36-101] 81  (12/08 0903) Resp:  [12-25] 20  (12/08 0800) BP: (86-124)/(41-95) 104/47 mmHg (12/08 0903) SpO2:  [93 %-100 %] 98 % (12/08 0800) FiO2 (%):  [29.7 %-45.7 %] 30 % (12/08 0906) I/O last 3 completed shifts: In: 6101 [I.V.:5821; NG/GT:30; IV Piggyback:250] Out: 2532 [Urine:2532] CVP:  [7 mmHg-160 mmHg] 127 mmHg  Physical Examination: General:  NAD.  Neuro:  No focal deficits  HEENT:  Mucous membranes dry, no adenopathy, Neck veins flat at 30 degrees Cardiovascular: RRR, no MRG Lungs:  Clear anteriiorly Abdomen:  Soft, non-tender Musculoskeletal:  No edema, 2+ pulses, brisk CR  Ventilator settings: Vent Mode:  [-] PRVC FiO2 (%):  [29.7 %-45.7 %] 30 % Set Rate:  [16 bmp] 16 bmp Vt Set:  [510 mL] 510 mL PEEP:  [5 cmH20] 5 cmH20 Pressure Support:  [5 cmH20-8 cmH20] 5  cmH20 Plateau Pressure:  [11 cmH20] 11 cmH20  Labs and Imaging:   CXR: NSC B AS dz cw ARDS  BMET    Component Value Date/Time   NA 137 03/26/2011 0500   K 3.7 03/26/2011 0500   CL 107 03/26/2011 0500   CO2 18* 03/26/2011 0500   GLUCOSE 170* 03/26/2011 0500   BUN 60* 03/26/2011 0500   CREATININE 2.71* 03/26/2011 0500   CALCIUM 6.8* 03/26/2011 0500   GFRNONAA 20* 03/26/2011 0500   GFRAA 23* 03/26/2011 0500   CBC    Component Value Date/Time   WBC 19.2* 03/26/2011 0500   RBC 4.02* 03/26/2011 0500   HGB 13.0 03/26/2011 0500   HCT 36.3* 03/26/2011 0500   PLT 11* 03/26/2011 0549   MCV 90.3 03/26/2011 0500   MCH 32.3 03/26/2011 0500   MCHC 35.8 03/26/2011 0500   RDW 14.1 03/26/2011 0500   LYMPHSABS 0.3* 03/25/2011 0630   MONOABS 0.5 03/25/2011 0630   EOSABS 0.0 03/25/2011 0630   BASOSABS 0.0 03/25/2011 0630    Assessment and Plan:  Severe sepsis/septic shock: with GNR bacteremia - unclear source. His CC was abdominal sx but abd exam very unimpressive CVP in desired range -Weaning pressors fr MAP > 65 mmHg -F/U cx data and consider further eval if indicated (?GI source vs UTI - UA unimpressive)  Acute respiratory failure/ARDS: . Secondary to severe sepsis/septic shock Well supported on current  vent settings - cont same Daily WUA/SBT if indicated Now in SAILS ARDSNet 2 trial  Tachycardia w/ frequent PACs and brief episodes of AF. No further AF. Rate better controlled -cards notified, followed by Erlene Quan  Acute on chronic renal failure due to ATN. Baseline scr in 2 range. Nonoliguric. No indication for HD presently Recent Labs  Basename 03/26/11 0500 03/25/11 0630 03/24/11 2250   CREATININE 2.71* 3.40* 3.30*3.38*  Cont to monitor Avoid nephrotoxins Careful attention to volume status  Metabolic acidosis, both gap/non-gap picture. May be d/t some degree of bicarb loss w/ diarrhea.   Lab 03/26/11 0500 03/25/11 0630 03/24/11 2250  CO2 18* 13* 16*16*  Change IVFs to include HCO3 to  continue Monitor renal panel  Diarrhea, nausea and vomiting all c/w viral Gastroenteritis, also consider enteric pathogens C diff PCR negative Stool cx pending  Septic encephalopathy,  Cont sedation protocol   Thrombocytopenia: likely DIC. No acute bleeding  Lab 03/26/11 0549 03/26/11 0500 03/25/11 0630  PLT 11* 10* 20*  Monitor Transfuse plts if bleeding No hep  Severe protein calorie malnutrition Start TF oxepa  Hard of hearing May need consult hearing aide  Best practices / Disposition: -->ICU status under PCCM -->full code -->SCDs for DVT Px -->Protonix for GI Px -->ventilator bundle  The patient is critically ill with multiple organ systems failure and requires high complexity decision making for assessment and support, frequent evaluation and titration of therapies, application of advanced monitoring technologies and extensive interpretation of multiple databases. Critical Care Time devoted to patient care services described in this note is  45 minutes.  Shan Levans 03/26/2011, 9:47 AM

## 2011-03-26 NOTE — Plan of Care (Signed)
Problem: Inadequate Intake (NI-2.1) Goal: Food and/or nutrient delivery Individualized approach for food/nutrient provision. Outcome: Progressing Tube feedings, oxepa

## 2011-03-26 NOTE — Progress Notes (Signed)
INITIAL ADULT NUTRITION ASSESSMENT Date: 03/26/2011   Time: 11:10 AM Reason for Assessment: Tube feeding consult  ASSESSMENT: Male 77 y.o.  Dx: Gastroenteritis Patient Active Problem List  Diagnoses  . Gastroenteritis  . Dehydration  . Acute on chronic renal failure  . Atrial fibrillation  . Tachycardia  . Hypotension  . Hard of hearing  . Chronic renal insufficiency, stage III (moderate)  . Pulmonary edema  . Acute respiratory failure  . Thrombocytopenia  . Encephalopathy  . Metabolic acidosis   Hx:  Past Medical History  Diagnosis Date  . Irregular heart rate   . Kidney stones   . Hard of hearing   . CKD (chronic kidney disease)   . HTN (hypertension)   . Hiatal hernia    Related Meds:     . antiseptic oral rinse  15 mL Mouth Rinse QID  . aspirin  300 mg Rectal Daily  . cefTAZidime (FORTAZ)  IV  2 g Intravenous Q24H  . chlorhexidine  15 mL Mouth/Throat BID  . hydrocortisone sod succinate (SOLU-CORTEF) injection  50 mg Intravenous Q6H  . INVESTIGATIONAL DRUG SIMPLE RECORD  20 mg Oral Daily  . INVESTIGATIONAL DRUG SIMPLE RECORD  20 mg Oral Once  . levofloxacin (LEVAQUIN) IV  500 mg Intravenous Q48H  . pantoprazole (PROTONIX) IV  40 mg Intravenous QHS  . SAILS Study - rosuvastatin / placebo (loading dose)  ([facility])  40 mg Oral Once  . DISCONTD: haloperidol lactate  5 mg Intravenous Once  . DISCONTD: INVESTIGATIONAL DRUG SIMPLE RECORD  20 mg Oral Daily  . DISCONTD: INVESTIGATIONAL DRUG SIMPLE RECORD  10 mg Oral Daily  . DISCONTD: oseltamivir  75 mg Oral BID  . DISCONTD: piperacillin-tazobactam (ZOSYN)  IV  2.25 g Intravenous Q6H    Ht: 5\' 10"  (177.8 cm)  Wt: 164 lb 0.4 oz (74.4 kg)  Ideal Wt: 166 Lbs % Ideal Wt: 99%  Usual Wt: 164 Lbs % Usual Wt: 100%  Body mass index is 23.53 kg/(m^2).  Food/Nutrition Related Hx: well nourished PTA  Labs:  CMP     Component Value Date/Time   NA 137 03/26/2011 0500   K 3.7 03/26/2011 0500   CL 107 03/26/2011  0500   CO2 18* 03/26/2011 0500   GLUCOSE 170* 03/26/2011 0500   BUN 60* 03/26/2011 0500   CREATININE 2.71* 03/26/2011 0500   CALCIUM 6.8* 03/26/2011 0500   PROT 4.7* 03/26/2011 0500   ALBUMIN 1.9* 03/26/2011 0500   AST 52* 03/26/2011 0500   ALT 48 03/26/2011 0500   ALKPHOS 87 03/26/2011 0500   BILITOT 1.2 03/26/2011 0500   GFRNONAA 20* 03/26/2011 0500   GFRAA 23* 03/26/2011 0500     Intake: I/O last 3 completed shifts: In: 6101 [I.V.:5821; NG/GT:30; IV Piggyback:250] Out: 2532 [Urine:2532] Total I/O In: 340 [I.V.:240; IV Piggyback:100] Out: 450 [Urine:450]   Output:   Diet Order:  NPO, intubated  IVF:    sodium chloride Last Rate: 20 mL/hr (03/25/11 1615)  feeding supplement (OXEPA)   fentaNYL infusion INTRAVENOUS Last Rate: 100 mcg/hr (03/24/11 1800)  phenylephrine (NEO-SYNEPHRINE) Adult infusion Last Rate: 80 mcg/min (03/26/11 1000)  propofol Last Rate: 10 mcg/kg/min (03/26/11 0630)  sodium bicarbonate infusion Last Rate: 75 mL/hr at 03/26/11 0800  DISCONTD: dextrose Last Rate: 50 mL/hr at 03/25/11 0700    Estimated Nutritional Needs:   Kcal: 16-1800 Protein: 95-105 g Fluid: 1.9 L  NUTRITION DIAGNOSIS: Inadequate oral intake related to inability to eat as evidenced by NPO status.   MONITORING/EVALUATION(Goals):  Meet 90 % of estimated needs to preserve LBM  EDUCATION NEEDS: -Education not appropriate at this time  INTERVENTION: Note order for Oxepa at goal rate of 40 ml/hr. Currently propofol at 4.5 ml/hr , providing a total of 1560 Kcal, 60 grams protein Rec addition of Pro-Stat 64 sugar free 2 times per day to add additional 30 grams protein  Dietitian #:[id number]  DOCUMENTATION CODES Per approved criteria  -Not Applicable    Rowin Bayron,KATHY 03/26/2011, 11:10 AM

## 2011-03-27 ENCOUNTER — Inpatient Hospital Stay (HOSPITAL_COMMUNITY): Payer: Medicare Other

## 2011-03-27 DIAGNOSIS — J984 Other disorders of lung: Secondary | ICD-10-CM

## 2011-03-27 LAB — BASIC METABOLIC PANEL
BUN: 56 mg/dL — ABNORMAL HIGH (ref 6–23)
Calcium: 6.9 mg/dL — ABNORMAL LOW (ref 8.4–10.5)
Creatinine, Ser: 2.19 mg/dL — ABNORMAL HIGH (ref 0.50–1.35)
GFR calc Af Amer: 30 mL/min — ABNORMAL LOW (ref >90)
GFR calc non Af Amer: 26 mL/min — ABNORMAL LOW (ref >90)
Glucose, Bld: 155 mg/dL — ABNORMAL HIGH (ref 70–99)
Potassium: 3.8 mEq/L (ref 3.5–5.1)

## 2011-03-27 LAB — CULTURE, BLOOD (ROUTINE X 2)
Culture  Setup Time: 201212061441
Culture  Setup Time: 201212061441

## 2011-03-27 LAB — POCT I-STAT 3, ART BLOOD GAS (G3+)
Acid-base deficit: 2 mmol/L (ref 0.0–2.0)
Bicarbonate: 20.4 mEq/L (ref 20.0–24.0)
O2 Saturation: 94 %
Patient temperature: 98.8
TCO2: 21 mmol/L (ref 0–100)
pO2, Arterial: 66 mmHg — ABNORMAL LOW (ref 80.0–100.0)

## 2011-03-27 LAB — CBC
HCT: 32.1 % — ABNORMAL LOW (ref 39.0–52.0)
MCH: 31.8 pg (ref 26.0–34.0)
MCHC: 35.2 g/dL (ref 30.0–36.0)
RDW: 14.1 % (ref 11.5–15.5)

## 2011-03-27 LAB — GLUCOSE, CAPILLARY
Glucose-Capillary: 104 mg/dL — ABNORMAL HIGH (ref 70–99)
Glucose-Capillary: 113 mg/dL — ABNORMAL HIGH (ref 70–99)

## 2011-03-27 MED ORDER — WHITE PETROLATUM GEL
Status: AC
  Administered 2011-03-27: 10:00:00
  Filled 2011-03-27: qty 5

## 2011-03-27 NOTE — Progress Notes (Signed)
Name: Richard Reed MRN: [id number] DOB: [birth year]    LOS: 4  PCCM PROGRESS NOTE  PT PROFILE:  77 year old male admitted on 12/5 w/ one day h/o N/V/D. In ER found to be hypotensive, with acute on chronic renal failure, thrombocytopenic, and had bilateral pulm infiltrates. His BP responded to 3 IVF bolus. He was started on empiric abx. Admitted to SDU. From 12/5 to 12/6 he developed progressive increase in WOB. PCCM asked to eval.   Lines / Tubes: Right IJ 12/6 >> ETT 12/6 >>  MICRO:   procalcitonin 12/6: 91.53   mrsa PCR 12/5: neg Urine strep 12/6>>> NEG Urine legionella 12/6>>> NEG Influenza PCR 12/6>>> NEG C diff PCR 12/6>>> NEG stool culture 12/6>>> bcx2 12/6>>> 2/2 GNRs >>   Antibiotics: Rocephin 12/5>>> 12/6 vanc 12/5>>> 12/7 tamiflu 12/6>>> 12/7 Zosyn 12/7 >> 12/7 levaquin 12/6>>> Ceftaz 12/7 >>   Tests / Events ENrolled in ARDSNET 2 SAILS study statin vs placebo  SUBJ: RASS 0. + F/C. Looks very comfortable on 30% FiO2. Remains on pressors but lower dose of neo, tol PS 5/5 Now in SAILS trial, has ectopy and bigeminy  Vital Signs: Temp:  [98.2 F (36.8 C)-99.3 F (37.4 C)] 98.8 F (37.1 C) (12/09 0800) Pulse Rate:  [36-99] 90  (12/09 0818) Resp:  [15-21] 18  (12/09 0818) BP: (90-143)/(44-92) 125/92 mmHg (12/09 0818) SpO2:  [92 %-100 %] 100 % (12/09 0818) FiO2 (%):  [29.7 %-30.4 %] 30 % (12/09 0818) Weight:  [79.5 kg (175 lb 4.3 oz)] 175 lb 4.3 oz (79.5 kg) (12/09 0500) I/O last 3 completed shifts: In: 4888.4 [I.V.:4138.4; NG/GT:540; IV Piggyback:210] Out: 4479 [Urine:4479] CVP:  [6 mmHg-324 mmHg] 7 mmHg  Physical Examination: General:  NAD.  Neuro:  No focal deficits  HEENT:  Mucous membranes dry, no adenopathy, Neck veins flat at 30 degrees Cardiovascular: RRR, no MRG Lungs:  Clear anteriiorly Abdomen:  Soft, non-tender Musculoskeletal:  No edema, 2+ pulses, brisk CR  Ventilator settings: Vent Mode:  [-] CPAP FiO2 (%):  [29.7 %-30.4 %] 30  % Set Rate:  [16 bmp] 16 bmp Vt Set:  [510 mL] 510 mL PEEP:  [4.9 cmH20-5 cmH20] 5 cmH20 Pressure Support:  [5 cmH20-8 cmH20] 8 cmH20 Plateau Pressure:  [12 cmH20-13 cmH20] 13 cmH20  Labs and Imaging:   CXR: slow improvement in ARDS pattern BMET    Component Value Date/Time   NA 140 03/27/2011 0535   K 3.8 03/27/2011 0535   CL 109 03/27/2011 0535   CO2 22 03/27/2011 0535   GLUCOSE 155* 03/27/2011 0535   BUN 56* 03/27/2011 0535   CREATININE 2.19* 03/27/2011 0535   CALCIUM 6.9* 03/27/2011 0535   GFRNONAA 26* 03/27/2011 0535   GFRAA 30* 03/27/2011 0535   CBC    Component Value Date/Time   WBC 11.0* 03/27/2011 0535   RBC 3.55* 03/27/2011 0535   HGB 11.3* 03/27/2011 0535   HCT 32.1* 03/27/2011 0535   PLT 14* 03/27/2011 0535   MCV 90.4 03/27/2011 0535   MCH 31.8 03/27/2011 0535   MCHC 35.2 03/27/2011 0535   RDW 14.1 03/27/2011 0535   LYMPHSABS 0.3* 03/25/2011 0630   MONOABS 0.5 03/25/2011 0630   EOSABS 0.0 03/25/2011 0630   BASOSABS 0.0 03/25/2011 0630    Assessment and Plan:  Severe sepsis/septic shock: with GNR bacteremia - unclear source. His CC was abdominal sx but abd exam very unimpressive CVP in desired range -Weaning pressors fr MAP > 65 mmHg -F/U cx data and consider  further eval if indicated (?GI source vs UTI - UA unimpressive)  Acute respiratory failure/ARDS: . Secondary to severe sepsis/septic shock Well supported on current vent settings - cont same Daily WUA/SBT  Now in SAILS ARDSNet 2 trial  Tachycardia w/ frequent PACs and brief episodes of AF. No further AF. Rate better controlled -cards notified, followed by Erlene Quan -watch ectpy  Acute on chronic renal failure due to ATN. Baseline scr in 2 range. Nonoliguric. Cr better Recent Labs  Basename 03/27/11 0535 03/26/11 0500 03/25/11 0630   CREATININE 2.19* 2.71* 3.40*  Cont to monitor Avoid nephrotoxins Careful attention to volume status D/c nacho3 drip  Metabolic acidosis, both gap/non-gap picture. May be d/t some  degree of bicarb loss w/ diarrhea.  improved  Lab 03/27/11 0535 03/26/11 0500 03/25/11 0630  CO2 22 18* 13*  d/c NaHCO3 drip  Diarrhea, nausea and vomiting all c/w viral Gastroenteritis, also consider enteric pathogens C diff PCR negative Stool cx pending  Septic encephalopathy,  Cont sedation protocol   Thrombocytopenia: likely DIC. No acute bleeding  Lab 03/27/11 0535 03/26/11 0549 03/26/11 0500  PLT 14* 11* 10*  Monitor Transfuse plts if bleeding No hep  Severe protein calorie malnutrition Cont TF oxepa  Hard of hearing May need consult hearing aide  Best practices / Disposition: -->ICU status under PCCM -->full code -->SCDs for DVT Px -->Protonix for GI Px -->ventilator bundle Family was updated at bedside 12/8 The patient is critically ill with multiple organ systems failure and requires high complexity decision making for assessment and support, frequent evaluation and titration of therapies, application of advanced monitoring technologies and extensive interpretation of multiple databases. Critical Care Time devoted to patient care services described in this note is  45 minutes.  Shan Levans 03/27/2011, 8:44 AM

## 2011-03-27 NOTE — Procedures (Signed)
Extubation Procedure Note  Patient Details:   Name: CLOIS TREANOR DOB: 03-08-1926 MRN: [id number]     Evaluation  O2 sats: stable throughout Complications: No apparent complications Patient did tolerate procedure well. Bilateral Breath Sounds: Clear Suctioning: Airway;Oral Yes  Pt extubated at 11:40 am no apparent complications. Positive leak test. Currently on 4L nasal cannula and sats at 100%. Stefan Markarian V 03/27/2011, 11:52 AM

## 2011-03-28 ENCOUNTER — Inpatient Hospital Stay (HOSPITAL_COMMUNITY): Payer: Medicare Other

## 2011-03-28 DIAGNOSIS — J13 Pneumonia due to Streptococcus pneumoniae: Secondary | ICD-10-CM

## 2011-03-28 LAB — STOOL CULTURE

## 2011-03-28 LAB — ALT: ALT: 34 U/L (ref 0–53)

## 2011-03-28 LAB — CBC
Hemoglobin: 12 g/dL — ABNORMAL LOW (ref 13.0–17.0)
MCH: 31.9 pg (ref 26.0–34.0)
Platelets: 12 10*3/uL — CL (ref 150–400)
RBC: 3.76 MIL/uL — ABNORMAL LOW (ref 4.22–5.81)

## 2011-03-28 LAB — RESPIRATORY VIRUS PANEL
Adenovirus E: NOT DETECTED
Coronavirus229E: NOT DETECTED
CoronavirusHKU1: NOT DETECTED
CoronavirusNL63: NOT DETECTED
CoronavirusOC43: NOT DETECTED
Influenza A: NOT DETECTED
Influenza B: NOT DETECTED
Metapneumovirus: NOT DETECTED
Parainfluenza 1: NOT DETECTED
Parainfluenza 2: NOT DETECTED
Respiratory Syncytial Virus B: NOT DETECTED

## 2011-03-28 LAB — BASIC METABOLIC PANEL
CO2: 25 mEq/L (ref 19–32)
Calcium: 7.4 mg/dL — ABNORMAL LOW (ref 8.4–10.5)
GFR calc non Af Amer: 31 mL/min — ABNORMAL LOW (ref >90)
Glucose, Bld: 103 mg/dL — ABNORMAL HIGH (ref 70–99)
Potassium: 3.3 mEq/L — ABNORMAL LOW (ref 3.5–5.1)
Sodium: 145 mEq/L (ref 135–145)

## 2011-03-28 LAB — GLUCOSE, CAPILLARY
Glucose-Capillary: 98 mg/dL (ref 70–99)
Glucose-Capillary: 105 mg/dL — ABNORMAL HIGH (ref 70–99)

## 2011-03-28 LAB — CK: Total CK: 70 U/L (ref 7–232)

## 2011-03-28 MED ORDER — BIOTENE DRY MOUTH MT LIQD
15.0000 mL | Freq: Four times a day (QID) | OROMUCOSAL | Status: DC
  Administered 2011-03-28 – 2011-03-29 (×4): 15 mL via OROMUCOSAL

## 2011-03-28 MED ORDER — POTASSIUM CHLORIDE 10 MEQ/50ML IV SOLN
10.0000 meq | INTRAVENOUS | Status: AC
  Administered 2011-03-28 (×2): 10 meq via INTRAVENOUS
  Filled 2011-03-28: qty 50

## 2011-03-28 MED ORDER — SODIUM CHLORIDE 0.9 % IJ SOLN
INTRAMUSCULAR | Status: AC
  Administered 2011-03-28: 20 mL
  Filled 2011-03-28: qty 20

## 2011-03-28 MED ORDER — ASPIRIN EC 325 MG PO TBEC
325.0000 mg | DELAYED_RELEASE_TABLET | Freq: Every day | ORAL | Status: DC
  Administered 2011-03-28 – 2011-03-31 (×4): 325 mg via ORAL
  Filled 2011-03-28 (×4): qty 1

## 2011-03-28 MED ORDER — PANTOPRAZOLE SODIUM 40 MG PO TBEC
40.0000 mg | DELAYED_RELEASE_TABLET | Freq: Every day | ORAL | Status: DC
  Administered 2011-03-28 – 2011-03-31 (×4): 40 mg via ORAL
  Filled 2011-03-28 (×4): qty 1

## 2011-03-28 MED ORDER — SODIUM CHLORIDE 0.9 % IJ SOLN
INTRAMUSCULAR | Status: AC
  Administered 2011-03-28: 10 mL
  Filled 2011-03-28: qty 20

## 2011-03-28 MED ORDER — DEXTROSE 5 % IV SOLN
1.0000 g | INTRAVENOUS | Status: DC
  Administered 2011-03-28 – 2011-03-30 (×2): 1 g via INTRAVENOUS
  Filled 2011-03-28 (×5): qty 10

## 2011-03-28 MED ORDER — POTASSIUM CHLORIDE 10 MEQ/50ML IV SOLN
INTRAVENOUS | Status: AC
  Administered 2011-03-28: 10 meq via INTRAVENOUS
  Filled 2011-03-28: qty 50

## 2011-03-28 MED ORDER — POTASSIUM CHLORIDE 10 MEQ/100ML IV SOLN: 10.0000 meq | INTRAVENOUS | Status: DC

## 2011-03-28 MED FILL — Gelatin Absorbable Sponge 12-7 MM: CUTANEOUS | Qty: 1 | Status: AC

## 2011-03-28 NOTE — Progress Notes (Signed)
eLink Physician-Brief Progress Note Patient Name: Richard Reed DOB: May 04, 1925 MRN: [id number]  Date of Service  03/28/2011   HPI/Events of Note   Low k   eICU Interventions  Give k    Intervention Category Major Interventions: Electrolyte abnormality - evaluation and management  [name]. 03/28/2011, 6:55 AM

## 2011-03-28 NOTE — Progress Notes (Signed)
Name: Richard Reed MRN: [id number] DOB: 30-Nov-1925    LOS: 5  PCCM PROGRESS NOTE  PT PROFILE:  77 year old male admitted on 12/5 w/ one day h/o N/V/D. In ER found to be hypotensive, with acute on chronic renal failure, thrombocytopenic, and had bilateral pulm infiltrates.  His BP responded to 3 IVF bolus.  He was started on empiric abx.  Admitted to SDU.  From 12/5 to 12/6 he developed progressive increase in WOB. PCCM asked to eval.   Lines / Tubes: Right IJ 12/6 >> ETT 12/6 >>12/9  MICRO:   Procalcitonin 12/6: 91.53   MRSA PCR 12/5: neg Urine strep 12/6>>> NEG Urine legionella 12/6>>> NEG Influenza PCR 12/6>>> NEG C diff PCR 12/6>>> NEG Stool culture 12/6>>>Negative Bcx2 12/6>>> 2/2 GNRs >> E. Coli.  Antibiotics: Rocephin 12/5>>> 12/6 vanc 12/5>>> 12/7 tamiflu 12/6>>> 12/7 Zosyn 12/7 >> 12/7 Levaquin 12/6>>>12/10 Ceftaz 12/7 >>12/10 Rocephin 12/10>>>  Tests / Events Enrolled in ARDSNET 2 SAILS study statin vs placebo.  SUBJ: RASS 0. + F/C. Looks very comfortable on 30% FiO2. Remains on pressors but lower dose of neo, tol PS 5/5 Now in SAILS trial, has ectopy and bigeminy.  Vital Signs: Temp:  [97.3 F (36.3 C)-98.9 F (37.2 C)] 98.9 F (37.2 C) (12/10 1100) Pulse Rate:  [38-105] 60  (12/10 1300) Resp:  [12-24] 22  (12/10 1300) BP: (87-123)/(43-89) 119/78 mmHg (12/10 1300) SpO2:  [90 %-100 %] 90 % (12/10 1300) I/O last 3 completed shifts: In: 2823.3 [P.O.:240; I.V.:1833.3; NG/GT:580; IV Piggyback:170] Out: 2224 [Urine:2224] CVP:  [5 mmHg-337 mmHg] 5 mmHg  Physical Examination: General:  NAD.  Neuro:  No focal deficits  HEENT:  Mucous membranes dry, no adenopathy, Neck veins flat at 30 degrees Cardiovascular: RRR, no MRG Lungs:  Clear anteriiorly Abdomen:  Soft, non-tender Musculoskeletal:  No edema, 2+ pulses, brisk CR  Ventilator settings:  Extubated on 12/9.  Labs and Imaging:   CXR: slow improvement in ARDS pattern BMET    Component Value  Date/Time   NA 145 03/28/2011 0420   K 3.3* 03/28/2011 0420   CL 113* 03/28/2011 0420   CO2 25 03/28/2011 0420   GLUCOSE 103* 03/28/2011 0420   BUN 55* 03/28/2011 0420   CREATININE 1.90* 03/28/2011 0420   CALCIUM 7.4* 03/28/2011 0420   GFRNONAA 31* 03/28/2011 0420   GFRAA 36* 03/28/2011 0420   CBC    Component Value Date/Time   WBC 12.0* 03/28/2011 0420   RBC 3.76* 03/28/2011 0420   HGB 12.0* 03/28/2011 0420   HCT 34.0* 03/28/2011 0420   PLT 12* 03/28/2011 0420   MCV 90.4 03/28/2011 0420   MCH 31.9 03/28/2011 0420   MCHC 35.3 03/28/2011 0420   RDW 14.1 03/28/2011 0420   LYMPHSABS 0.3* 03/25/2011 0630   MONOABS 0.5 03/25/2011 0630   EOSABS 0.0 03/25/2011 0630   BASOSABS 0.0 03/25/2011 0630    Assessment and Plan:  Severe sepsis/septic shock: with GNR bacteremia - unclear source. His CC was abdominal sx but abd exam very unimpressive CVP in desired range. - Weaning pressors fr MAP > 65 mmHg. - D/C ceftaz and levaquin, start rocephin.  Acute respiratory failure/ARDS: . Secondary to severe sepsis/septic shock. - Titrate O2 for sat of 88-92%. - IS and flutter valve.  Tachycardia w/ frequent PACs and brief episodes of AF. No further AF. Rate better controlled - Cards notified, followed by Erlene Quan. - Watch ectopy.  Acute on chronic renal failure due to ATN. Baseline scr in 2 range.  Nonoliguric. Cr better. Recent Labs  Basename 03/28/11 0420 03/27/11 0535 03/26/11 0500   CREATININE 1.90* 2.19* 2.71*  - Cont to monitor - Avoid nephrotoxins - Careful attention to volume status. - D/c nacho3 drip.  Metabolic acidosis, both gap/non-gap picture. May be d/t some degree of bicarb loss w/ diarrhea.  Improved.  Lab 03/28/11 0420 03/27/11 0535 03/26/11 0500  CO2 25 22 18*  d/c NaHCO3 drip  Diarrhea, nausea and vomiting all c/w viral Gastroenteritis, e coli noted. - C diff PCR negative. - Stool cx negative. - Change to rocephin..  Septic encephalopathy,  - D/C sedation  protocol.  Thrombocytopenia: likely DIC. No acute bleeding  Lab 03/28/11 0420 03/27/11 0535 03/26/11 0549  PLT 12* 14* 11*  Monitor Transfuse plts if bleeding No hep  Severe protein calorie malnutrition Cont TF oxepa  Hard of hearing May need consult hearing aide  Will need additional rehab and assessment of his level of independence prior to discharge.  PCCM signing off upon transfer to [facility] team 8.  Davell Beckstead 03/28/2011, 1:18 PM

## 2011-03-29 DIAGNOSIS — M79609 Pain in unspecified limb: Secondary | ICD-10-CM

## 2011-03-29 DIAGNOSIS — M7989 Other specified soft tissue disorders: Secondary | ICD-10-CM

## 2011-03-29 LAB — CBC
MCH: 32.1 pg (ref 26.0–34.0)
MCHC: 35.4 g/dL (ref 30.0–36.0)
Platelets: 29 10*3/uL — CL (ref 150–400)
RBC: 4.39 MIL/uL (ref 4.22–5.81)
RDW: 13.9 % (ref 11.5–15.5)

## 2011-03-29 LAB — BASIC METABOLIC PANEL
CO2: 22 mEq/L (ref 19–32)
Calcium: 7.9 mg/dL — ABNORMAL LOW (ref 8.4–10.5)
GFR calc Af Amer: 41 mL/min — ABNORMAL LOW (ref >90)
GFR calc non Af Amer: 35 mL/min — ABNORMAL LOW (ref >90)
Sodium: 143 mEq/L (ref 135–145)

## 2011-03-29 LAB — MAGNESIUM: Magnesium: 2.4 mg/dL (ref 1.5–2.5)

## 2011-03-29 LAB — PHOSPHORUS: Phosphorus: 3 mg/dL (ref 2.3–4.6)

## 2011-03-29 MED ORDER — TAMSULOSIN HCL 0.4 MG PO CAPS
0.8000 mg | ORAL_CAPSULE | Freq: Every day | ORAL | Status: DC
  Administered 2011-03-29 – 2011-03-31 (×3): 0.8 mg via ORAL
  Filled 2011-03-29 (×5): qty 2

## 2011-03-29 MED ORDER — POTASSIUM CHLORIDE CRYS ER 20 MEQ PO TBCR
40.0000 meq | EXTENDED_RELEASE_TABLET | Freq: Two times a day (BID) | ORAL | Status: DC
  Administered 2011-03-29 – 2011-03-30 (×3): 40 meq via ORAL
  Filled 2011-03-29 (×4): qty 2

## 2011-03-29 NOTE — Progress Notes (Signed)
72f Foley replaced after 10 hours of no urine output and bladderscan of . Aseptic technique maintained after cleansing meatus with betadine. Immediate return of 475 urine via foley after replacement. Will continue to assess.

## 2011-03-29 NOTE — Progress Notes (Signed)
Physical Therapy Evaluation Patient Details Name: Richard Reed MRN: [id number] DOB: [birth year] Today's Date: 03/29/2011  Problem List:  Patient Active Problem List  Diagnoses  . Gastroenteritis  . Dehydration  . Acute on chronic renal failure  . Atrial fibrillation  . Tachycardia  . Hypotension  . Hard of hearing  . Chronic renal insufficiency, stage III (moderate)  . Pulmonary edema  . Acute respiratory failure  . Thrombocytopenia  . Encephalopathy  . Metabolic acidosis    Past Medical History:  Past Medical History  Diagnosis Date  . Irregular heart rate   . Kidney stones   . Hard of hearing   . CKD (chronic kidney disease)   . HTN (hypertension)   . Hiatal hernia    Past Surgical History: History reviewed. No pertinent past surgical history.  PT Assessment/Plan/Recommendation PT Assessment Clinical Impression Statement: 77 y.o. male admitted to [facility] for N/V/D diagnosed with acute on chronic renal failure, bil lung infiltrates, hypotension and thrombocytopenia.  He presents with decreased endurance, decreased activity tolerance and decreased balance.  He would benefit from skilled acute PT to maximize independence, functional mobility and safety so that he may be able to return home with wife's assistance and supervision safely at discharge.  We may need to look at an assistive device for home to help with his balance and some HHPT f/u as well.   PT Recommendation/Assessment: Patient will need skilled PT in the acute care venue PT Problem List: Decreased activity tolerance;Decreased balance;Decreased mobility;Decreased coordination PT Therapy Diagnosis : Difficulty walking;Abnormality of gait PT Plan PT Frequency: Min 3X/week PT Treatment/Interventions: DME instruction;Gait training;Stair training;Functional mobility training;Therapeutic activities;Therapeutic exercise;Balance training;Neuromuscular re-education;Patient/family education PT Recommendation Follow Up  Recommendations: Home health PT;24 hour supervision/assistance Equipment Recommended: Rolling walker with 5" wheels;Other (comment) (vs. cane to be assess during gait next treatment session) PT Goals  Acute Rehab PT Goals PT Goal Formulation: With patient Time For Goal Achievement: 2 weeks Pt will go Supine/Side to Sit: with modified independence;with HOB 0 degrees;with rail PT Goal: Supine/Side to Sit - Progress: Not met Pt will go Sit to Supine/Side: with modified independence;with HOB 0 degrees;with rail PT Goal: Sit to Supine/Side - Progress: Not met Pt will go Sit to Stand: with modified independence PT Goal: Sit to Stand - Progress: Not met Pt will go Stand to Sit: with modified independence PT Goal: Stand to Sit - Progress: Not met Pt will Transfer Bed to Chair/Chair to Bed: with modified independence PT Transfer Goal: Bed to Chair/Chair to Bed - Progress: Not met Pt will Ambulate: >150 feet;with supervision;with least restrictive assistive device PT Goal: Ambulate - Progress: Not met Pt will Go Up / Down Stairs: 3-5 stairs;with supervision;with rail(s) PT Goal: Up/Down Stairs - Progress: Not met  PT Evaluation Precautions/Restrictions  Precautions Precautions: Fall Precaution Comments: Patient is very HOH, reports balance issues (mild) PTA.   Prior Functioning  Home Living Lives With: Spouse Receives Help From: Family Type of Home: House Home Layout: One level Home Access: Stairs to enter Entrance Stairs-Rails: Right;Left;Can reach both Entrance Stairs-Number of Steps: 3 Home Adaptive Equipment: None Prior Function Level of Independence: Independent with basic ADLs;Independent with homemaking with ambulation;Independent with gait;Independent with transfers Able to Take Stairs?: Yes Driving: Yes Cognition Cognition Arousal/Alertness: Awake/alert Overall Cognitive Status: Appears within functional limits for tasks assessed Orientation Level: Oriented X4 Cognition -  Other Comments: At times the patient has difficulty understanding secondary to very HOH.   Sensation/Coordination   Extremity Assessment RLE Assessment RLE  Assessment: Within Functional Limits LLE Assessment LLE Assessment: Within Functional Limits Mobility (including Balance) Transfers Transfers: Yes Sit to Stand: 4: Min assist;With upper extremity assist;With armrests;From chair/3-in-1 Sit to Stand Details (indicate cue type and reason): min assist to help boost patient up out of low chair and steady him for balance once standing.   Stand to Sit: 4: Min assist;With upper extremity assist;With armrests;To chair/3-in-1 Stand to Sit Details: min assist to help control descent to sit.   Ambulation/Gait Ambulation/Gait: Yes Ambulation/Gait Assistance: 4: Min assist Ambulation/Gait Assistance Details (indicate cue type and reason): min assist to steady patient for balance.  Staggering gait pattern.  Patient repots feeling lightheaded once standing (may need to investigate vestibular system a little more).   Ambulation Distance (Feet): 10 Feet Assistive device: 1 person hand held assist Gait Pattern: Step-through pattern (staggering)    Exercise    End of Session PT - End of Session Activity Tolerance: Patient limited by fatigue Patient left: in chair;with call bell in reach Nurse Communication: Mobility status for transfers;Mobility status for ambulation (d/c recommendation of HHPT) General Behavior During Session: [facility] for tasks performed Cognition: [facility] for tasks performed  Jaimee Corum B. Kilo Eshelman, PT, DPT [phone]   03/29/2011, 3:21 PM

## 2011-03-29 NOTE — Progress Notes (Signed)
Right lower extremity venous duplex completed at 16:10.  Preliminary report is negative for DVT, SVT, or a Baker's cyst. Smiley Houseman 03/29/2011, 4:47 PM

## 2011-03-29 NOTE — Progress Notes (Signed)
Clinical social worker received referral for potential placement. CSW is awaiting physical therapy evaluation, and will assess patient needs. CSW continuing to follow.   Catha Gosselin, Theresia Majors  [phone] .03/29/2011 14:55pm

## 2011-03-29 NOTE — Progress Notes (Signed)
Subjective: Right thigh pain that has now resolved. Otherwise no complaints.   Objective: Vital signs in last 24 hours: Temp:  [97.4 F (36.3 C)-98.8 F (37.1 C)] 97.9 F (36.6 C) (12/11 1200) Pulse Rate:  [32-123] 80  (12/11 1300) Resp:  [12-21] 20  (12/11 1300) BP: (106-138)/(49-95) 137/70 mmHg (12/11 1300) SpO2:  [94 %-100 %] 99 % (12/11 1300) Weight:  [175 lb 11.3 oz (79.7 kg)] 175 lb 11.3 oz (79.7 kg) (12/11 0400) Weight change:  Last BM Date: 03/27/11  Intake/Output from previous day: 12/10 0701 - 12/11 0700 In: 940 [P.O.:240; I.V.:450; IV Piggyback:250] Out: 1070 [Urine:1070] Intake/Output this shift: Total I/O In: 260 [P.O.:240; I.V.:20] Out: 265 [Urine:265]  General appearance: alert, cooperative and no distress Head: Normocephalic, without obvious abnormality, atraumatic Resp: clear to auscultation bilaterally Cardio: sinus rhythm. Occasional PVCs.  GI: soft, non-tender; bowel sounds normal; no masses,  no organomegaly Extremities: mild R calf swelling >L, no erythema or induration, mild TTP, no palpable cords.  Pulses: 2+ and symmetric Neurologic: Grossly normal  Lab Results:  Basename 03/29/11 0410 03/28/11 0420  WBC 15.1* 12.0*  HGB 14.1 12.0*  HCT 39.8 34.0*  PLT 29* 12*   BMET  Basename 03/29/11 0410 03/28/11 0420  NA 143 145  K 3.4* 3.3*  CL 110 113*  CO2 22 25  GLUCOSE 111* 103*  BUN 56* 55*  CREATININE 1.72* 1.90*  CALCIUM 7.9* 7.4*     Medications: I have reviewed the patient's current medications.  Assessment/Plan: Assessment and Plan: 77 yo M presented with gastroenteritis and dehydration subsequently developed Gm negative sepsis and shock.   1. Severe sepsis/septic shock:  A: with GNR bacteremia - with E.coli.  Most likely GI given preceding N//V/D.  C.diff and stool Cx negative.  Patient improving no longer having GI symptoms.  P: -MAP at goal off pressors. -Ceftaz and Levaquin D/C. Continuing Ceftriaxone.   2. Acute  respiratory failure/ARDS: . Secondary to severe sepsis/septic shock.  A: Pt extubated and now on RA.  - IS and flutter valve.    3. Tachycardia w/ frequent PACs and brief episodes of AF.  A:  No further AF. Rate controlled.  P: - F/u Cards recommendation, primary cardiologist is Dr. Gery Pray with [facility].  - Watch ectopy.  -Restart metoprolol prn tachycardia.   4. Acute on chronic renal failure  A: Due to ATN. Baseline scr in 2 range. Nonoliguric. Cr better.  P:  - Cont to monitor Cr.  - Avoid nephrotoxins  - Careful attention to volume status.    5. Metabolic acidosis A: both gap/non-gap picture. May be d/t some degree of bicarb loss w/ diarrhea.  Improved. P:  -Continue to monitor bicarb.    6. Thrombocytopenia: A:  likely DIC. No acute bleeding. Trending up. P:  -Monitor Plt. -No hep.  7. R LE pain: A: no pain currently. Slight swelling concerning for DVT especially in setting of sepsis.  P: RLE Doppler.   8. Urinary retention: A: Due to foley catheter.  P:  -Flomax -D/C foley in AM and f/u UO.   9. DVT PPX- SCDs  10. GI: reg diet.   11: Dispo: to tele. PT to evaluate patient. Plan to D/C to home in 1-2 days pending continued clinical improvement.    LOS: 6 days   Wania Longstreth 03/29/2011, 2:00 PM

## 2011-03-30 LAB — CBC
MCHC: 34.1 g/dL (ref 30.0–36.0)
Platelets: 49 10*3/uL — ABNORMAL LOW (ref 150–400)
RDW: 14 % (ref 11.5–15.5)

## 2011-03-30 LAB — BASIC METABOLIC PANEL
Calcium: 7.9 mg/dL — ABNORMAL LOW (ref 8.4–10.5)
GFR calc Af Amer: 44 mL/min — ABNORMAL LOW (ref >90)
GFR calc non Af Amer: 38 mL/min — ABNORMAL LOW (ref >90)
Potassium: 3.8 mEq/L (ref 3.5–5.1)
Sodium: 146 mEq/L — ABNORMAL HIGH (ref 135–145)

## 2011-03-30 MED ORDER — METOPROLOL SUCCINATE 12.5 MG HALF TABLET
12.5000 mg | ORAL_TABLET | Freq: Every day | ORAL | Status: DC
  Administered 2011-03-30 – 2011-03-31 (×2): 12.5 mg via ORAL
  Filled 2011-03-30 (×2): qty 1

## 2011-03-30 NOTE — Progress Notes (Signed)
Attempted to insert foley per MD order. Bladder scan was . Unsuccessful due to pt swelling genital area and meeting resistance. Will notify MD and will continue to monitor. Pt states that they still do not have the urge to urinate.

## 2011-03-30 NOTE — Progress Notes (Signed)
PGY-1 Daily Progress Note Family Medicine Teaching Service D. Piloto Rolene Arbour, MD Service Pager: [phone]  Patient name: Richard Reed  Medical record [id number] Date of birth:1926/01/13 Age: 77 y.o. Gender: male  LOS: 7 days   Subjective:   Objective:  Vitals: Temp: 98 F (36.7 C)  Pulse Rate: 92   Resp: 20   BP: 150/86 mmHg  SpO2: 94 %   Physical Exam: General appearance: alert, cooperative and no distress  HEENT: Moist Mucosa. No adenopathies. Resp: Clear to auscultation bilaterally  Cardio: sinus rhythm. Occasional PVCs.  GI: soft, non-tender; bowel sounds normal; no masses, no organomegaly  Extremities: No painful no edema. Pulses: 2+ and symmetric  Neurologic: Alert and oriented. Decreased hearing bilaterally (has hearing aid). No signs of focalization.  Labs and imaging:  CBC  Lab 03/30/11 0650 03/29/11 0410 03/28/11 0420  WBC 9.8 15.1* 12.0*  HGB 13.4 14.1 12.0*  HCT 39.3 39.8 34.0*  PLT 49* 29* 12*   BMET  Lab 03/30/11 0650 03/29/11 0410 03/28/11 0420  NA 146* 143 145  K 3.8 3.4* 3.3*  CL 113* 110 113*  CO2 25 22 25   BUN 50* 56* 55*  CREATININE 1.62* 1.72* 1.90*  LABGLOM -- -- --  GLUCOSE 93 -- --  CALCIUM 7.9* 7.9* 7.4*    No results found. Medications: Medication Dose Route Frequency  . acetaminophen (TYLENOL) tablet 650 mg  650 mg Oral Q6H PRN    . acetaminophen (TYLENOL) suppository 650 mg  650 mg Rectal Q6H PRN  . aspirin EC tablet 325 mg  325 mg Oral Daily  . cefTRIAXone (ROCEPHIN) 1 g in dextrose 5 % 50 mL IVPB  1 g I/V Q24H  . metoprolol succinate (TOPROL-XL) 24 hr tablet 12.5 mg  12.5 mg Oral Daily  . pantoprazole (PROTONIX) EC tablet 40 mg  40 mg Oral Q1200  . Tamsulosin HCl (FLOMAX) capsule 0.8 mg  0.8 mg Oral QPC breakfast   Assessment and Plan:   77 yo M presented with gastroenteritis and dehydration subsequently developed GM negative sepsis and shock.  1. Severe sepsis/septic shock: GNR bacteremia (E.coli). Most likely  GI given preceding N//V/D. C.diff and stool Cx negative. Patient improving no longer having GI symptoms.  -Continuing Ceftriaxone for a total course of 14 days  (started  03/23/11)  2. Acute respiratory failure/ARDS: . Secondary to severe sepsis/septic shock.  Pt extubated and now on RA.  - IS and flutter valve.   3. Tachycardia w/ frequent PACs and brief episodes of AF. No further AF. Rate controlled.  -F/u Cards recommendation, primary cardiologist is Dr. Gery Pray with [facility].  -Watch ectopy.  -Restarted on  metoprolol 12.5 mg possible increase to 25 mg  home dose if normal BP today.   4. Acute on chronic renal failure  Due to ATN. Baseline scr in 2 range. Nonoliguric. Cr 1.62 continues improving even better than his baseline. - Cont to monitor Cr.  - Avoid nephrotoxins   5. Metabolic acidosis: Both gap/non-gap picture. May be d/t some degree of bicarb loss w/ diarrhea. Resolved. Normal bicarb. -Continue to monitor bicarb.   6. Thrombocytopenia: likely DIC. No acute bleeding. Trending up.  -Monitor Plt.  -No heparin.  8. Urinary retention:  -Will try discontinue foley and voiding trial with bladder scan if more than 250 residual will place foley back and f/u as outpatient with urology. - continue Flomax   9. DVT PPX- SCDs  10. GI: heart healthy diet. SL IV 11: Dispo:  Possible  discharge tomorrow if pt continues improving.   D. Piloto Rolene Arbour, MD PGY1, [facility] Medicine Teaching Service Pager [phone] 03/30/2011

## 2011-03-30 NOTE — Progress Notes (Signed)
2nd attempt to insert foley successful with immediate urine return. Pt tolerated well. MD notified. Will continue to monitor.

## 2011-03-30 NOTE — Research (Signed)
Mr. Willet agreed to continue in the SAILS: Statins for the Acutely Injured Lungs from Sepsis Research Study. Study was explained to him and all questions were answered. Consent to continue was obtained. Aleshia Cartelli, Sherrilee Gilles, Public librarian

## 2011-03-30 NOTE — Progress Notes (Signed)
MD notified and returned called. Advised to attempt foley one more time. Will carry out order and update MD.

## 2011-03-30 NOTE — Progress Notes (Signed)
I discussed with Dr Funches.  I agree with their plans documented in their  Note for today.  

## 2011-03-31 MED ORDER — ASPIRIN 325 MG PO TBEC: 325.0000 mg | DELAYED_RELEASE_TABLET | Freq: Every day | ORAL | Status: AC

## 2011-03-31 MED ORDER — METOPROLOL SUCCINATE 12.5 MG HALF TABLET: 12.5000 mg | ORAL_TABLET | Freq: Every day | ORAL | Status: DC

## 2011-03-31 MED ORDER — TAMSULOSIN HCL 0.4 MG PO CAPS: 0.8000 mg | ORAL_CAPSULE | Freq: Every day | ORAL | Status: DC

## 2011-03-31 MED ORDER — CEPHALEXIN 500 MG PO CAPS: 500.0000 mg | ORAL_CAPSULE | Freq: Four times a day (QID) | ORAL | Status: AC

## 2011-03-31 NOTE — Progress Notes (Signed)
I did not supervise this resident on this date.

## 2011-03-31 NOTE — Progress Notes (Signed)
Physical Therapy Treatment Patient Details Name: Richard Reed MRN: [id number] DOB: 10-07-1925 Today's Date: 03/31/2011  PT Assessment/Plan  PT - Assessment/Plan Comments on Treatment Session: Pt able to increase ambulation distance however needs to use hand rail to maintian balance.  Highly recommended using RW however pt and wife refused.  HHPT to assess assitive device needed if pt d/c home today.   PT Plan: Discharge plan remains appropriate PT Frequency: Min 3X/week Follow Up Recommendations: Home health PT;24 hour supervision/assistance Equipment Recommended: Rolling walker with 5" wheels;Other (comment) PT Goals  Acute Rehab PT Goals PT Goal Formulation: With patient Time For Goal Achievement: 2 weeks Pt will go Supine/Side to Sit: with modified independence;with HOB 0 degrees;with rail PT Goal: Supine/Side to Sit - Progress: Not met Pt will go Sit to Supine/Side: with modified independence;with HOB 0 degrees;with rail PT Goal: Sit to Supine/Side - Progress: Progressing toward goal Pt will go Sit to Stand: with modified independence PT Goal: Sit to Stand - Progress: Progressing toward goal Pt will go Stand to Sit: with modified independence PT Goal: Stand to Sit - Progress: Progressing toward goal Pt will Transfer Bed to Chair/Chair to Bed: with modified independence PT Transfer Goal: Bed to Chair/Chair to Bed - Progress: Progressing toward goal Pt will Ambulate: >150 feet;with supervision;with least restrictive assistive device PT Goal: Ambulate - Progress: Progressing toward goal Pt will Go Up / Down Stairs: 3-5 stairs;with supervision;with rail(s) PT Goal: Up/Down Stairs - Progress: Other (comment) (pt refused stairs)  PT Treatment Precautions/Restrictions  Precautions Precautions: Fall Precaution Comments: Patient is very HOH, reports balance issues (mild) PTA.   Restrictions Weight Bearing Restrictions: No Mobility (including Balance) Transfers Transfers: Yes Sit  to Stand: 4: Min assist;With upper extremity assist;With armrests;From chair/3-in-1 Sit to Stand Details (indicate cue type and reason): (A) to initiate transfer from low surface and cues for hand placement. Stand to Sit: 4: Min assist;With upper extremity assist;With armrests;To chair/3-in-1 Stand to Sit Details: (A) to slowly descend to recliner. Ambulation/Gait Ambulation/Gait: Yes Ambulation/Gait Assistance: Other (comment) (minguard for safety) Ambulation/Gait Assistance Details (indicate cue type and reason): Minguard for safety;  Cues for safety.  Pt uses rails throughout ambulation.  Discussed using RW and pt and caregiver refuses.  Will defer to HHPT but highly recommend using RW. Ambulation Distance (Feet): 150 Feet Assistive device: Other (Comment) (rail) Gait Pattern: Step-through pattern Stairs: No (pt refused stairs today)  Balance Balance Assessed: No Exercise    End of Session PT - End of Session Equipment Utilized During Treatment: Gait belt Activity Tolerance: Patient limited by fatigue Patient left: in chair;with call bell in reach Nurse Communication: Mobility status for transfers;Mobility status for ambulation General Behavior During Session: [facility] for tasks performed Cognition: [facility] for tasks performed  Ariany Kesselman 03/31/2011, 2:51 PM [phone]

## 2011-03-31 NOTE — Progress Notes (Signed)
PGY-1 Daily Progress Note Family Medicine Teaching Service D. Piloto Rolene Arbour, MD Service Pager: [phone]  Patient name: Richard Reed  Medical record [id number] Date of birth:[birth year] Age: 77 y.o. Gender: male  LOS: 8 days   Subjective: Feeling better. Afebrile. Good PO intake. Failed bladder scan and Foley was place back after two attempts. Good urine output with foley.  Objective:  Vitals: Temp:   98 F  Pulse Rate: ] 86   Resp:  19   BP:132/70 mmHg  SpO2:   97 %  Weight:  172 lb 11.2 oz   Intake/Output Summary (Last 24 hours) at 03/31/11 0946 Last data filed at 03/31/11 0700  Gross per 24 hour  Intake    720 ml  Output   3150 ml  Net  -2430 ml   Physical Exam:  General appearance: alert, cooperative and no distress  HEENT: Moist Mucosa. No adenopathies.  Resp: Clear to auscultation bilaterally  Cardio: sinus rhythm.  GI: soft, non-tender; bowel sounds normal; no masses, no organomegaly  Extremities: No painful no edema.  Pulses: 2+ and symmetric  Neurologic: Alert and oriented. Decreased hearing bilaterally (has hearing aid). No signs of focalization.    Labs and imaging:  None needed for today.   Medications: Medication Dose Route Frequency  . acetaminophen (TYLENOL) tablet 650 mg  650 mg Oral Q6H PRN  . aspirin EC tablet 325 mg  325 mg Oral Daily  . cefTRIAXone (ROCEPHIN) 1 g in dextrose 5 % 50 mL IVPB  1 g I/V Q24H  . metoprolol succinate (TOPROL-XL) 24 hr tablet 12.5 mg  12.5 mg Oral Daily  . pantoprazole (PROTONIX) EC tablet 40 mg  40 mg Oral Q1200  . Tamsulosin HCl (FLOMAX) capsule 0.8 mg  0.8 mg Oral QPC breakfast   Assessment and Plan: 77 yo M presented with gastroenteritis and dehydration subsequently developed GM negative sepsis and shock.  1. Severe sepsis/septic shock: GNR bacteremia (E.coli). Most likely GI given preceding N//V/D. C.diff and stool Cx negative. Patient improving no longer having GI symptoms.  -Continuing antibiotic  coverage for a total course of 14 days (started 03/23/11)  2. Acute respiratory failure/ARDS: . Secondary to severe sepsis/septic shock. Pt extubated and now on RA. Resolved. 3. Tachycardia w/ frequent PACs and brief episodes of AF. No further AF. Rate controlled.  Primary cardiologist is Dr. Gery Pray with [facility].  -Continue on metoprolol 12.5 mg and differ to increase dose to his cardiologist since Pt is rate controlled with HR on the 65-80 4. Thrombocytopenia: likely DIC. No acute bleeding. Trending up.  -Monitor Plt.  -No heparin.  8. Urinary retention: On Flomax and had a voiding trial that pt failed with 450 ml retention. Foley was placed back after second attempt.  -Continue With Foley placed and f/u as outpatient with his Urologist Dr. Mena Goes [facility] in [place]) -10. GI: heart healthy diet. SL IV  11: Dispo: Possible discharge today.  D. Piloto Rolene Arbour, MD PGY1, [facility] Medicine Teaching Service Pager [phone] 03/31/2011

## 2011-03-31 NOTE — Progress Notes (Signed)
I have seen and examined this patient. I have discussed with Dr Aviva Signs.  I agree with their findings and plans as documented in their progress note for today.  Acute issues 1. E. Coli septic shock- resolved. To complete a total of 14 days effective antibiotic therapy.   2. Urinary Retention: Replaced foley cath to gravity.  Patient with history of localized prostate cancer treat with brachytherapy about 10 years ago.   Patient to follow up with [facility] tomorrow.  Continue Flomax.  Plan discharge home with foley cath to gravity in place.   3. Disposition: Anticipate dicharge home today with follow up with urology and PCP.

## 2011-04-04 NOTE — Discharge Summary (Signed)
Physician Discharge Summary  Patient ID: Richard Reed [id number] [birth year] 77 y.o.  Admit date: 03/23/2011 Discharge date: 03/31/2011  PCP: Richard Edin, MD, MD   Discharge Diagnosis: 1. Shock septic secondary to E.Coli bacteremia. 2. Cardiac Arrhythmia 3. Urinary retention.   Discharge Medications  [name], Guard  Home Medication Instructions [id number]   Printed on:04/04/11 1316  Medication Information                    aspirin EC 325 MG EC tablet Take 1 tablet (325 mg total) by mouth daily.           metoprolol succinate (TOPROL-XL) 12.5 mg TB24 Take 0.5 tablets (12.5 mg total) by mouth daily.           Tamsulosin HCl (FLOMAX) 0.4 MG CAPS Take 2 capsules (0.8 mg total) by mouth daily after breakfast.           cephALEXin (KEFLEX) 500 MG capsule Take 1 capsule (500 mg total) by mouth 4 (four) times daily.              Consults: Critical Care Medicine.   Labs: CBC  Lab 03/30/11 0650 03/29/11 0410  WBC 9.8 15.1*  HGB 13.4 14.1  HCT 39.3 39.8  PLT 49* 29*   BMET  Lab 03/31/11 0535 03/30/11 0650 03/29/11 0410  NA -- 146* 143  K -- 3.8 3.4*  CL -- 113* 110  CO2 -- 25 22  BUN -- 50* 56*  CREATININE -- 1.62* 1.72*  CALCIUM -- 7.9* 7.9*  PROT -- -- --  BILITOT -- -- --  ALKPHOS -- -- --  ALT 21 -- --  AST -- -- --  GLUCOSE -- 93 111*   No results found for this or any previous visit (from the past 72 hour(s)). diff and stool Cx negative. Procedures/Imaging:  Ct Head Wo Contrast 03/23/2011   IMPRESSION:  1. No acute intracranial abnormality. 2. Cerebral atrophy and small vessel ischemic change.  Original Report Authenticated By: Consuello Bossier, M.D.   Dg Chest Port 1 View 03/28/2011  IMPRESSION: Endotracheal tube removed.  Interval increase in bibasilar airspace disease which may be due to pneumonia or edema.  Original Report Authenticated By: Camelia Phenes, M.D.   Dg Chest Port 1 View 03/27/2011  IMPRESSION: Improved aeration, with  decreased infiltrate in both lung bases since prior exam.  Original Report Authenticated By: Danae Orleans, M.D.   Brief Hospital Course: 77 yo M presented with gastroenteritis and dehydration subsequently developed GM negative sepsis and shock.  1. Severe sepsis/septic shock: Pt was admitted with a complaint of nausea, vomiting and diarrhea. The clinical picture when he came in was dehydration and was hydrated with total of 4 L. Patient developed fever tachycardia and hypotension and was no responding to fluid resuscitation. Was started on antibiotic and transferred to intensive care unit. Pt developed an ARDS  Secondary to sepsis, he was intubated and responded to treatment and successfully extubated with narrowed antibiotic coverage per sensitivity to ceftriaxone; and discharge on keflex oral to complete 14 day total of abx therapy.  2. Tachycardia w/ frequent PACs and brief episodes of AF. Rate controlled. No anticoag treatment due to thrombocytopenia. Primary cardiologist is Dr. Gery Pray with [facility]. Pt  Hypotensive. Once BP was controlled was restarted on metoprolol 12.5 mg and differ to increase dose to his cardiologist 3 Urinary retention: On Flomax and had a voiding trial that pt failed with 450 ml retention Discharge with  Foley placed and f/u as outpatient with his Urologist Dr. Mena Goes ([facility] in [place])  Patient condition at time of discharge/disposition:  Patient is discharge home  on stable medical condition.   Follow up issues: 1. Cardiac arrhthymias. Currently on metoprolol (home med) at reduced dose due to BP and pulse. 2. Urinary retention. F/U with Urology. Pt is discharge home with the Foley catheter after been placed on Flomax and 2 trials with 450 ml of urinary retention.  3. Platelets.  Discharge Orders    Future Orders Please Complete By Expires   Diet - low sodium heart healthy      Increase activity slowly      Discharge instructions      Comments:    Foley care        D. Piloto Rolene Arbour, MD  [facility] 04/04/2011

## 2011-04-05 NOTE — Discharge Summary (Signed)
I have seen and examined this patient. I have discussed with Dr Piloto.  I agree with their findings and plans as documented in their discharge note.  

## 2011-04-07 ENCOUNTER — Ambulatory Visit (INDEPENDENT_AMBULATORY_CARE_PROVIDER_SITE_OTHER): Payer: Medicare Other

## 2011-04-07 DIAGNOSIS — R609 Edema, unspecified: Secondary | ICD-10-CM

## 2011-04-07 DIAGNOSIS — J129 Viral pneumonia, unspecified: Secondary | ICD-10-CM

## 2011-04-07 DIAGNOSIS — N390 Urinary tract infection, site not specified: Secondary | ICD-10-CM

## 2011-04-12 ENCOUNTER — Encounter (HOSPITAL_COMMUNITY): Payer: Self-pay | Admitting: *Deleted

## 2011-04-12 ENCOUNTER — Emergency Department (HOSPITAL_COMMUNITY): Payer: Medicare Other

## 2011-04-12 ENCOUNTER — Other Ambulatory Visit: Payer: Self-pay

## 2011-04-12 ENCOUNTER — Inpatient Hospital Stay (HOSPITAL_COMMUNITY)
Admission: EM | Admit: 2011-04-12 | Discharge: 2011-04-17 | DRG: 417 | Disposition: A | Discharge status: Home / Self Care | Payer: Medicare Other | Attending: Family Medicine | Admitting: Family Medicine

## 2011-04-12 DIAGNOSIS — I129 Hypertensive chronic kidney disease with stage 1 through stage 4 chronic kidney disease, or unspecified chronic kidney disease: Secondary | ICD-10-CM | POA: Diagnosis present

## 2011-04-12 DIAGNOSIS — Z79899 Other long term (current) drug therapy: Secondary | ICD-10-CM

## 2011-04-12 DIAGNOSIS — N189 Chronic kidney disease, unspecified: Secondary | ICD-10-CM | POA: Diagnosis present

## 2011-04-12 DIAGNOSIS — K8050 Calculus of bile duct without cholangitis or cholecystitis without obstruction: Secondary | ICD-10-CM | POA: Diagnosis present

## 2011-04-12 DIAGNOSIS — K8000 Calculus of gallbladder with acute cholecystitis without obstruction: Principal | ICD-10-CM | POA: Diagnosis present

## 2011-04-12 DIAGNOSIS — R339 Retention of urine, unspecified: Secondary | ICD-10-CM | POA: Diagnosis present

## 2011-04-12 DIAGNOSIS — N390 Urinary tract infection, site not specified: Secondary | ICD-10-CM

## 2011-04-12 DIAGNOSIS — I4891 Unspecified atrial fibrillation: Secondary | ICD-10-CM | POA: Diagnosis present

## 2011-04-12 DIAGNOSIS — K859 Acute pancreatitis without necrosis or infection, unspecified: Secondary | ICD-10-CM | POA: Diagnosis present

## 2011-04-12 DIAGNOSIS — C61 Malignant neoplasm of prostate: Secondary | ICD-10-CM

## 2011-04-12 DIAGNOSIS — Z6826 Body mass index (BMI) 26.0-26.9, adult: Secondary | ICD-10-CM

## 2011-04-12 HISTORY — DX: Cardiac arrhythmia, unspecified: I49.9

## 2011-04-12 HISTORY — DX: Pneumonia, unspecified organism: J18.9

## 2011-04-12 HISTORY — DX: Angina pectoris, unspecified: I20.9

## 2011-04-12 HISTORY — DX: Sepsis due to Escherichia coli (e. coli): A41.51

## 2011-04-12 MED ORDER — ONDANSETRON HCL 4 MG/2ML IJ SOLN
4.0000 mg | Freq: Once | INTRAMUSCULAR | Status: AC
  Administered 2011-04-13: 4 mg via INTRAVENOUS
  Filled 2011-04-12: qty 2

## 2011-04-12 NOTE — ED Provider Notes (Signed)
History     CSN: [id number]  Arrival date & time 04/12/11  2255   First MD Initiated Contact with Patient 04/12/11 2324      Chief Complaint  Patient presents with  . Abdominal Pain    (Consider location/radiation/quality/duration/timing/severity/associated sxs/prior treatment) HPI Comments: Patient presents c/o mid back pain that radiates around to his upper abd.  patient states that he took some Tylenol at home which has not relieved his symptoms.  Patient no longer has any back pain but does have the upper abdominal pain still.  He denies any fevers.  He has had some dry heaves since his arrival here in the emergency department but denies any nausea currently.  He states he did not have a normal bowel movement today but did yesterday.  He denies any prior abdominal surgeries.  He denies any cough or shortness of breath.  He does state some of his pain is now in his upper chest as well.  He describes as a mild discomfort.  He has had similar pain in the past when he's had kidney stones.  He denies any hematuria or dysuria although he did have a recent urinary tract infection earlier this month.  Patient is a 77 y.o. male presenting with abdominal pain. The history is provided by the patient and the spouse. No language interpreter was used.  Abdominal Pain The primary symptoms of the illness include abdominal pain and nausea. The primary symptoms of the illness do not include fever, fatigue, shortness of breath, vomiting, diarrhea, hematemesis, hematochezia or dysuria. The current episode started 1 to 2 hours ago. The onset of the illness was gradual. The problem has not changed since onset. Additional symptoms associated with the illness include back pain. Symptoms associated with the illness do not include chills or hematuria.    Past Medical History  Diagnosis Date  . Irregular heart rate   . Kidney stones   . Hard of hearing   . CKD (chronic kidney disease)   . HTN (hypertension)     . Hiatal hernia     History reviewed. No pertinent past surgical history.  Family History  Problem Relation Age of Onset  . Heart failure Mother     History  Substance Use Topics  . Smoking status: Never Smoker   . Smokeless tobacco: Never Used  . Alcohol Use: No      Review of Systems  Constitutional: Negative.  Negative for fever, chills and fatigue.  HENT: Negative.   Eyes: Negative.  Negative for discharge and redness.  Respiratory: Negative.  Negative for cough and shortness of breath.   Cardiovascular: Negative.  Negative for chest pain.  Gastrointestinal: Positive for nausea and abdominal pain. Negative for vomiting, diarrhea, hematochezia and hematemesis.  Genitourinary: Positive for flank pain. Negative for dysuria and hematuria.  Musculoskeletal: Positive for back pain.  Skin: Negative.  Negative for color change and rash.  Neurological: Negative for syncope and headaches.  Hematological: Negative.  Negative for adenopathy.  Psychiatric/Behavioral: Negative.  Negative for confusion.  All other systems reviewed and are negative.    Allergies  Review of patient's allergies indicates no known allergies.  Home Medications   Current Outpatient Rx  Name Route Sig Dispense Refill  . METOPROLOL SUCCINATE 12.5 MG HALF TABLET Oral Take 0.5 tablets (12.5 mg total) by mouth daily. 30 tablet 0  . TAMSULOSIN HCL 0.4 MG PO CAPS Oral Take 2 capsules (0.8 mg total) by mouth daily after breakfast. 30 capsule 0  BP 122/54  Pulse 65  Temp(Src) 97.6 F (36.4 C) (Oral)  Resp 16  Physical Exam  Constitutional: He is oriented to person, place, and time. He appears well-developed and well-nourished.  Non-toxic appearance. He does not have a sickly appearance.  HENT:  Head: Normocephalic and atraumatic.  Eyes: Conjunctivae, EOM and lids are normal. Pupils are equal, round, and reactive to light.  Neck: Trachea normal, normal range of motion and full passive range of  motion without pain. Neck supple.  Cardiovascular: Normal rate, regular rhythm and normal heart sounds.   Pulmonary/Chest: Effort normal and breath sounds normal. No respiratory distress. He has no wheezes. He has no rales. He exhibits no tenderness.  Abdominal: Soft. Normal appearance. He exhibits no distension. There is no tenderness. There is no rebound and no CVA tenderness.  Genitourinary:       No CVA tenderness  Musculoskeletal: Normal range of motion.  Neurological: He is alert and oriented to person, place, and time. He has normal strength.  Skin: Skin is warm, dry and intact. No rash noted.  Psychiatric: He has a normal mood and affect. His behavior is normal. Judgment and thought content normal.    ED Course  Procedures (including critical care time)  Results for orders placed during the hospital encounter of 04/12/11  CBC      Component Value Range   WBC 4.9  4.0 - 10.5 (K/uL)   RBC 3.66 (*) 4.22 - 5.81 (MIL/uL)   Hemoglobin 11.5 (*) 13.0 - 17.0 (g/dL)   HCT 40.9 (*) 81.1 - 52.0 (%)   MCV 93.7  78.0 - 100.0 (fL)   MCH 31.4  26.0 - 34.0 (pg)   MCHC 33.5  30.0 - 36.0 (g/dL)   RDW 91.4  78.2 - 95.6 (%)   Platelets 173  150 - 400 (K/uL)  DIFFERENTIAL      Component Value Range   Neutrophils Relative 69  43 - 77 (%)   Neutro Abs 3.4  1.7 - 7.7 (K/uL)   Lymphocytes Relative 17  12 - 46 (%)   Lymphs Abs 0.8  0.7 - 4.0 (K/uL)   Monocytes Relative 11  3 - 12 (%)   Monocytes Absolute 0.5  0.1 - 1.0 (K/uL)   Eosinophils Relative 2  0 - 5 (%)   Eosinophils Absolute 0.1  0.0 - 0.7 (K/uL)   Basophils Relative 0  0 - 1 (%)   Basophils Absolute 0.0  0.0 - 0.1 (K/uL)  COMPREHENSIVE METABOLIC PANEL      Component Value Range   Sodium 138  135 - 145 (mEq/L)   Potassium 4.6  3.5 - 5.1 (mEq/L)   Chloride 102  96 - 112 (mEq/L)   CO2 27  19 - 32 (mEq/L)   Glucose, Bld 121 (*) 70 - 99 (mg/dL)   BUN 24 (*) 6 - 23 (mg/dL)   Creatinine, Ser 2.13 (*) 0.50 - 1.35 (mg/dL)   Calcium 9.1   8.4 - 10.5 (mg/dL)   Total Protein 6.4  6.0 - 8.3 (g/dL)   Albumin 2.8 (*) 3.5 - 5.2 (g/dL)   AST 15  0 - 37 (U/L)   ALT 14  0 - 53 (U/L)   Alkaline Phosphatase 96  39 - 117 (U/L)   Total Bilirubin 0.6  0.3 - 1.2 (mg/dL)   GFR calc non Af Amer 43 (*) >90 (mL/min)   GFR calc Af Amer 49 (*) >90 (mL/min)  LIPASE, BLOOD      Component Value  Range   Lipase 125 (*) 11 - 59 (U/L)  URINALYSIS, ROUTINE W REFLEX MICROSCOPIC      Component Value Range   Color, Urine YELLOW  YELLOW    APPearance CLOUDY (*) CLEAR    Specific Gravity, Urine 1.015  1.005 - 1.030    pH 7.0  5.0 - 8.0    Glucose, UA NEGATIVE  NEGATIVE (mg/dL)   Hgb urine dipstick SMALL (*) NEGATIVE    Bilirubin Urine NEGATIVE  NEGATIVE    Ketones, ur NEGATIVE  NEGATIVE (mg/dL)   Protein, ur NEGATIVE  NEGATIVE (mg/dL)   Urobilinogen, UA 1.0  0.0 - 1.0 (mg/dL)   Nitrite POSITIVE (*) NEGATIVE    Leukocytes, UA LARGE (*) NEGATIVE   CARDIAC PANEL(CRET KIN+CKTOT+MB+TROPI)      Component Value Range   Total CK 23  7 - 232 (U/L)   CK, MB 2.4  0.3 - 4.0 (ng/mL)   Troponin I <0.30  <0.30 (ng/mL)   Relative Index RELATIVE INDEX IS INVALID  0.0 - 2.5   URINE MICROSCOPIC-ADD ON      Component Value Range   WBC, UA TOO NUMEROUS TO COUNT  <3 (WBC/hpf)   RBC / HPF 7-10  <3 (RBC/hpf)   Bacteria, UA MANY (*) RARE     US Abdomen Complete  04/13/2011  *RADIOLOGY REPORT*  Clinical Data:  Abdominal pain, nausea and elevated by dates.  ABDOMINAL ULTRASOUND COMPLETE  Comparison:  CT of the abdomen and pelvis performed 08/03/2010, and abdominal radiographs performed 04/12/2011  Findings:  Gallbladder:  Echogenic sludge is noted partially filling the gallbladder, and scattered stones are seen within the gallbladder, layering above the sludge.  The largest measures 0.7 cm in size. The gallbladder wall is thickened to 0.4 cm.  Evaluation for ultrasonographic Murphy's sign is equivocal, as the patient is sedated.  No pericholecystic fluid is seen.   Common Bile Duct:  0.5 cm in diameter; within normal limits in caliber.  Liver:  Normal parenchymal echogenicity and echotexture; no focal lesions identified.  Limited Doppler evaluation demonstrates normal blood flow within the liver.  IVC:  Unremarkable in appearance.  Pancreas:  There is suggestion of diffuse decreased echogenicity about the pancreas, which may reflect soft tissue edema and trace fluid.  However, the pancreas is difficult to fully characterize.  Spleen:  10.3 cm in length; within normal limits in size and echotexture.  Right kidney:  11.6 cm in length; normal in size, configuration and parenchymal echogenicity.  No evidence of hydronephrosis.  A 1.4 cm exophytic cyst is noted arising at the lower pole of the right kidney.  Left kidney:  12.0 cm in length; normal in size, configuration and parenchymal echogenicity.  No evidence of hydronephrosis.  Several cysts are noted arising from the left kidney, the largest of which measures 3.0 x 1.8 x 1.7 cm.  Abdominal Aorta:  Normal in caliber; no aneurysm identified.  IMPRESSION:  1.  Suggestion of diffuse decreased echogenicity about the pancreas, which may reflect soft tissue edema and trace fluid. Elevated lipase raises suspicion for underlying pancreatitis. 2.  Echogenic sludge and stones seen within the gallbladder, with gallbladder wall thickening.  Evaluation for ultrasonographic Murphy's sign is equivocal, as the patient is sedated. Cholecystitis cannot be excluded, though no pericholecystic fluid is seen. 3.  Bilateral renal cysts noted.  Original Report Authenticated By: Tonia Ghent, M.D.        Dg Abd Acute W/chest  04/13/2011  *RADIOLOGY REPORT*  Clinical Data: Abdominal pain and nausea  ACUTE ABDOMEN SERIES (ABDOMEN 2 VIEW & CHEST 1 VIEW)  Comparison: None.  Findings: Normal mediastinum and heart silhouette.  There is a peribronchial thickening at the lung bases.  No free air beneath hemidiaphragms.  No dilated loops of large or  small bowel. Moderate volume stool in the ascending colon.  There is a calculus in the region of the left renal hilum measuring 7 mm unchanged. Brachytherapy seeds in the prostate.  IMPRESSION:  1.  No evidence of bowel obstruction or intraperitoneal free air. 2.  Mild bronchitis or bronchiectasis at the lung bases.  3.  Mild constipation.  Original Report Authenticated By: Genevive Bi, M.D.      Date: 04/12/2011  Rate: 77  Rhythm: sinus rhythm with ventricular trigeminy  QRS Axis: normal  Intervals: normal  ST/T Wave abnormalities: normal  Conduction Disutrbances:none  Narrative Interpretation:   Old EKG Reviewed: unchanged from 03/23/11    MDM  Patient with upper abdominal pain and nausea that is consistent with pancreatitis.  The elevated lipase prompted me to obtain a abdominal ultrasound to look for causes for pancreatitis.  The ultrasound does show some gallbladder sludge and wall thickening concerning for possible early cholecystitis given the patient's associated symptoms.  Patient also has a urinary tract infection on his urinalysis which I have treated with ceftriaxone.  I have added Flagyl to the patient's antibiotics to cover for possible cholecystitis with the ceftriaxone.  I believe the patient will require admission for further observation given the multiple abnormalities discovered today and his studies.  I have discussed this patient with Dr. Carolynne Edouard from general surgery and he will have the daytime surgeon consult on this patient.  I have discussed this patient with the family practice resident who will admit this patient to their service since the patient's primary care physician is Dr. Cleta Alberts.          Nat Christen, MD 04/13/11 [phone]

## 2011-04-12 NOTE — ED Notes (Signed)
Pt unable to void at this time. 

## 2011-04-12 NOTE — ED Notes (Signed)
Patient here with c/o abdominal pain that started on is back and radiated to his right side and now it's is all over his abdomen.  Patient is also nauseated

## 2011-04-13 ENCOUNTER — Encounter (HOSPITAL_COMMUNITY): Payer: Self-pay | Admitting: Family Medicine

## 2011-04-13 ENCOUNTER — Emergency Department (HOSPITAL_COMMUNITY): Payer: Medicare Other

## 2011-04-13 DIAGNOSIS — R1011 Right upper quadrant pain: Secondary | ICD-10-CM

## 2011-04-13 DIAGNOSIS — K8050 Calculus of bile duct without cholangitis or cholecystitis without obstruction: Secondary | ICD-10-CM | POA: Diagnosis present

## 2011-04-13 DIAGNOSIS — N390 Urinary tract infection, site not specified: Secondary | ICD-10-CM

## 2011-04-13 DIAGNOSIS — C61 Malignant neoplasm of prostate: Secondary | ICD-10-CM

## 2011-04-13 LAB — URINE MICROSCOPIC-ADD ON

## 2011-04-13 LAB — COMPREHENSIVE METABOLIC PANEL
ALT: 14 U/L (ref 0–53)
AST: 15 U/L (ref 0–37)
Albumin: 2.8 g/dL — ABNORMAL LOW (ref 3.5–5.2)
Alkaline Phosphatase: 96 U/L (ref 39–117)
BUN: 24 mg/dL — ABNORMAL HIGH (ref 6–23)
Chloride: 102 mEq/L (ref 96–112)
Potassium: 4.6 mEq/L (ref 3.5–5.1)
Sodium: 138 mEq/L (ref 135–145)
Total Bilirubin: 0.6 mg/dL (ref 0.3–1.2)
Total Protein: 6.4 g/dL (ref 6.0–8.3)

## 2011-04-13 LAB — URINALYSIS, ROUTINE W REFLEX MICROSCOPIC
Bilirubin Urine: NEGATIVE
Glucose, UA: NEGATIVE mg/dL
Ketones, ur: NEGATIVE mg/dL
Specific Gravity, Urine: 1.015 (ref 1.005–1.030)
pH: 7 (ref 5.0–8.0)

## 2011-04-13 LAB — LIPASE, BLOOD: Lipase: 125 U/L — ABNORMAL HIGH (ref 11–59)

## 2011-04-13 LAB — CBC
Hemoglobin: 11.5 g/dL — ABNORMAL LOW (ref 13.0–17.0)
MCHC: 33.5 g/dL (ref 30.0–36.0)
Platelets: 173 10*3/uL (ref 150–400)
RBC: 3.66 MIL/uL — ABNORMAL LOW (ref 4.22–5.81)
WBC: 4.9 10*3/uL (ref 4.0–10.5)

## 2011-04-13 LAB — GRAM STAIN

## 2011-04-13 LAB — CARDIAC PANEL(CRET KIN+CKTOT+MB+TROPI)
CK, MB: 2.4 ng/mL (ref 0.3–4.0)
Relative Index: INVALID (ref 0.0–2.5)
Relative Index: INVALID (ref 0.0–2.5)
Troponin I: <0.3 ng/mL (ref <0.30)

## 2011-04-13 LAB — DIFFERENTIAL
Basophils Relative: 0 % (ref 0–1)
Lymphs Abs: 0.8 10*3/uL (ref 0.7–4.0)
Monocytes Relative: 11 % (ref 3–12)
Neutro Abs: 3.4 10*3/uL (ref 1.7–7.7)
Neutrophils Relative %: 69 % (ref 43–77)

## 2011-04-13 MED ORDER — ONDANSETRON HCL 4 MG/2ML IJ SOLN: 4.0000 mg | Freq: Four times a day (QID) | INTRAMUSCULAR | Status: DC | PRN

## 2011-04-13 MED ORDER — ACETAMINOPHEN 650 MG RE SUPP: 650.0000 mg | Freq: Four times a day (QID) | RECTAL | Status: DC | PRN

## 2011-04-13 MED ORDER — ONDANSETRON HCL 4 MG PO TABS: 4.0000 mg | ORAL_TABLET | Freq: Four times a day (QID) | ORAL | Status: DC | PRN

## 2011-04-13 MED ORDER — SODIUM CHLORIDE 0.9 % IV SOLN
INTRAVENOUS | Status: DC
  Administered 2011-04-13 – 2011-04-14 (×4): via INTRAVENOUS

## 2011-04-13 MED ORDER — PANTOPRAZOLE SODIUM 40 MG PO TBEC
40.0000 mg | DELAYED_RELEASE_TABLET | Freq: Every day | ORAL | Status: DC
  Administered 2011-04-14 – 2011-04-17 (×4): 40 mg via ORAL
  Filled 2011-04-13 (×3): qty 1

## 2011-04-13 MED ORDER — FENTANYL CITRATE 0.05 MG/ML IJ SOLN
50.0000 ug | Freq: Once | INTRAMUSCULAR | Status: AC
  Administered 2011-04-13: 50 ug via INTRAVENOUS
  Filled 2011-04-13: qty 2

## 2011-04-13 MED ORDER — DEXTROSE 5 % IV SOLN
1.0000 g | Freq: Once | INTRAVENOUS | Status: AC
  Administered 2011-04-13: 1 g via INTRAVENOUS
  Filled 2011-04-13: qty 10

## 2011-04-13 MED ORDER — DEXTROSE 5 % IV SOLN
1.0000 g | INTRAVENOUS | Status: DC
  Administered 2011-04-14: 1 g via INTRAVENOUS
  Filled 2011-04-13: qty 10

## 2011-04-13 MED ORDER — METOPROLOL SUCCINATE 12.5 MG HALF TABLET
12.5000 mg | ORAL_TABLET | Freq: Every day | ORAL | Status: DC
  Administered 2011-04-13 – 2011-04-17 (×4): 12.5 mg via ORAL
  Filled 2011-04-13 (×5): qty 1

## 2011-04-13 MED ORDER — TAMSULOSIN HCL 0.4 MG PO CAPS
0.8000 mg | ORAL_CAPSULE | Freq: Every day | ORAL | Status: DC
  Administered 2011-04-14 – 2011-04-17 (×4): 0.8 mg via ORAL
  Filled 2011-04-13 (×5): qty 2

## 2011-04-13 MED ORDER — ACETAMINOPHEN 325 MG PO TABS
650.0000 mg | ORAL_TABLET | Freq: Four times a day (QID) | ORAL | Status: DC | PRN
  Administered 2011-04-16: 650 mg via ORAL
  Filled 2011-04-13 (×2): qty 2

## 2011-04-13 MED ORDER — METRONIDAZOLE IN NACL 5-0.79 MG/ML-% IV SOLN
500.0000 mg | Freq: Three times a day (TID) | INTRAVENOUS | Status: DC
  Filled 2011-04-13 (×2): qty 100

## 2011-04-13 MED ORDER — METRONIDAZOLE IN NACL 5-0.79 MG/ML-% IV SOLN
500.0000 mg | Freq: Once | INTRAVENOUS | Status: AC
  Administered 2011-04-13: 500 mg via INTRAVENOUS
  Filled 2011-04-13: qty 100

## 2011-04-13 MED ORDER — PANTOPRAZOLE SODIUM 40 MG IV SOLR
40.0000 mg | INTRAVENOUS | Status: DC
  Filled 2011-04-13: qty 40

## 2011-04-13 NOTE — ED Notes (Signed)
Vital signs stable. 

## 2011-04-13 NOTE — ED Notes (Signed)
General Surgery at bedside consulting with pt

## 2011-04-13 NOTE — Consult Note (Signed)
I do believe that the patient had a bout of biliary colic.  I do not believe that he has acute cholecystitis.  His pain has improved 100% with only minimal pain medications. He has an active UTI which prohibits surgery until he has been treated for at least 48 hours and remains afebrile. Perhaps by Friday he will be ready for surgery.  Marta Lamas. Gae Bon, MD, FACS [phone] [facility]

## 2011-04-13 NOTE — ED Notes (Signed)
Patient and spouse updated on arrival of [place] tech from [facility] within hour.

## 2011-04-13 NOTE — Consult Note (Signed)
Reason for Consult:Abdominal Pain  Referring Physician: DR. [name]  Richard Reed is an 77 y.o. male.  HPI: Patient is an 77 year old gentleman who was recently hospitalized 12/ 5 through 1213 2012. with septic shock secondary to Escherichia coli bacteremia, acute renal failure, ARDS secondary to sepsis requiring intubation. Urinary retention requiring a Foley on discharge. History of tachycardia with atrial fibrillation. He was discharged home 03/31/2011 and had done well until yesterday. His Foley was removed at Dr. Mena Goes office 04/07/2011. Urine culture showed yeast and he completed a course of we think is Diflucan yesterday.  He had a late supper yesterday and around 7 PM developed pain in his shoulders across his back. He denies any shortness of breath with it. Pain later moved to his abdomen below the umbilicus. He describes it as going across the abdomen, there is no particular site. Pain persisted from about 8:30 to 10:15 when he went to the ER. While in the ER he had some nausea, and dry heaves. He's been seen by the family practice teaching service and started on antibiotics. He's had some pain medicine is currently asymptomatic. Workup in the emergency room shows a normal WBC. Creatinine 1.46, it was up to 4 on his last admission. Urinalysis shows a large number of WBCs bacteria. Gallbladder ultrasound shows some sludge and some scattered stones within the gallbladder layering above the sludge. The largest is 0.7 cm in size. Gallbladder wall is slightly thickened and 0.4 cm. Common bile duct was normal there is no pericholecystic fluid there is a suggestion of diffuse decreased echogenicity about the pancreas with a question of soft tissue edema. Plain film shows no free air no small or large bowel dilatation there is a large calculus in the region of the left renal hilum unchanged from before break E. therapy seeds in the prostate. Also some mild bronchitis or bronchiectasis.  LFTs are normal lipase slightly elevated at 125. We were asked to see in consultation. Past Medical History  Diagnosis Date  . Irregular heart rate   . Kidney stones   . Hard of hearing   . CKD (chronic kidney disease)   . HTN (hypertension)   . Hiatal hernia   . Sepsis due to Escherichia coli 03/24/11    History reviewed. No pertinent past surgical history.  Family History  Problem Relation Age of Onset  . Heart failure Mother     Social History:  reports that he has never smoked. He has never used smokeless tobacco. He reports that he does not drink alcohol or use illicit drugs.  Allergies: No Known Allergies  Medications:  Prior to Admission:  (Not in a hospital admission) Scheduled:   . cefTRIAXone (ROCEPHIN)  IV  1 g Intravenous Once  . fentaNYL  50 mcg Intravenous Once  . metronidazole  500 mg Intravenous Once  . ondansetron [facility]) IV  4 mg Intravenous Once   Continuous:   Results for orders placed during the hospital encounter of 04/12/11 (from the past 48 hour(s))  CBC     Status: Abnormal   Collection Time   04/12/11 11:57 PM      Component Value Range Comment   WBC 4.9  4.0 - 10.5 (K/uL)    RBC 3.66 (*) 4.22 - 5.81 (MIL/uL)    Hemoglobin 11.5 (*) 13.0 - 17.0 (g/dL)    HCT 16.1 (*) 09.6 - 52.0 (%)    MCV 93.7  78.0 - 100.0 (fL)    MCH 31.4  26.0 -  34.0 (pg)    MCHC 33.5  30.0 - 36.0 (g/dL)    RDW 16.1  09.6 - 04.5 (%)    Platelets 173  150 - 400 (K/uL)   DIFFERENTIAL     Status: Normal   Collection Time   04/12/11 11:57 PM      Component Value Range Comment   Neutrophils Relative 69  43 - 77 (%)    Neutro Abs 3.4  1.7 - 7.7 (K/uL)    Lymphocytes Relative 17  12 - 46 (%)    Lymphs Abs 0.8  0.7 - 4.0 (K/uL)    Monocytes Relative 11  3 - 12 (%)    Monocytes Absolute 0.5  0.1 - 1.0 (K/uL)    Eosinophils Relative 2  0 - 5 (%)    Eosinophils Absolute 0.1  0.0 - 0.7 (K/uL)    Basophils Relative 0  0 - 1 (%)    Basophils Absolute 0.0  0.0 - 0.1 (K/uL)     COMPREHENSIVE METABOLIC PANEL     Status: Abnormal   Collection Time   04/12/11 11:57 PM      Component Value Range Comment   Sodium 138  135 - 145 (mEq/L)    Potassium 4.6  3.5 - 5.1 (mEq/L)    Chloride 102  96 - 112 (mEq/L)    CO2 27  19 - 32 (mEq/L)    Glucose, Bld 121 (*) 70 - 99 (mg/dL)    BUN 24 (*) 6 - 23 (mg/dL)    Creatinine, Ser 4.09 (*) 0.50 - 1.35 (mg/dL)    Calcium 9.1  8.4 - 10.5 (mg/dL)    Total Protein 6.4  6.0 - 8.3 (g/dL)    Albumin 2.8 (*) 3.5 - 5.2 (g/dL)    AST 15  0 - 37 (U/L)    ALT 14  0 - 53 (U/L)    Alkaline Phosphatase 96  39 - 117 (U/L)    Total Bilirubin 0.6  0.3 - 1.2 (mg/dL)    GFR calc non Af Amer 43 (*) >90 (mL/min)    GFR calc Af Amer 49 (*) >90 (mL/min)   LIPASE, BLOOD     Status: Abnormal   Collection Time   04/12/11 11:57 PM      Component Value Range Comment   Lipase 125 (*) 11 - 59 (U/L)   CARDIAC PANEL(CRET KIN+CKTOT+MB+TROPI)     Status: Normal   Collection Time   04/12/11 11:58 PM      Component Value Range Comment   Total CK 23  7 - 232 (U/L)    CK, MB 2.4  0.3 - 4.0 (ng/mL)    Troponin I <0.30  <0.30 (ng/mL)    Relative Index RELATIVE INDEX IS INVALID  0.0 - 2.5    URINALYSIS, ROUTINE W REFLEX MICROSCOPIC     Status: Abnormal   Collection Time   04/13/11  2:20 AM      Component Value Range Comment   Color, Urine YELLOW  YELLOW     APPearance CLOUDY (*) CLEAR     Specific Gravity, Urine 1.015  1.005 - 1.030     pH 7.0  5.0 - 8.0     Glucose, UA NEGATIVE  NEGATIVE (mg/dL)    Hgb urine dipstick SMALL (*) NEGATIVE     Bilirubin Urine NEGATIVE  NEGATIVE     Ketones, ur NEGATIVE  NEGATIVE (mg/dL)    Protein, ur NEGATIVE  NEGATIVE (mg/dL)    Urobilinogen, UA 1.0  0.0 -  1.0 (mg/dL)    Nitrite POSITIVE (*) NEGATIVE     Leukocytes, UA LARGE (*) NEGATIVE    URINE MICROSCOPIC-ADD ON     Status: Abnormal   Collection Time   04/13/11  2:20 AM      Component Value Range Comment   WBC, UA TOO NUMEROUS TO COUNT  <3 (WBC/hpf)    RBC /  HPF 7-10  <3 (RBC/hpf)    Bacteria, UA MANY (*) RARE      US Abdomen Complete  04/13/2011  *RADIOLOGY REPORT*  Clinical Data:  Abdominal pain, nausea and elevated by dates.  ABDOMINAL ULTRASOUND COMPLETE  Comparison:  CT of the abdomen and pelvis performed 08/03/2010, and abdominal radiographs performed 04/12/2011  Findings:  Gallbladder:  Echogenic sludge is noted partially filling the gallbladder, and scattered stones are seen within the gallbladder, layering above the sludge.  The largest measures 0.7 cm in size. The gallbladder wall is thickened to 0.4 cm.  Evaluation for ultrasonographic Murphy's sign is equivocal, as the patient is sedated.  No pericholecystic fluid is seen.  Common Bile Duct:  0.5 cm in diameter; within normal limits in caliber.  Liver:  Normal parenchymal echogenicity and echotexture; no focal lesions identified.  Limited Doppler evaluation demonstrates normal blood flow within the liver.  IVC:  Unremarkable in appearance.  Pancreas:  There is suggestion of diffuse decreased echogenicity about the pancreas, which may reflect soft tissue edema and trace fluid.  However, the pancreas is difficult to fully characterize.  Spleen:  10.3 cm in length; within normal limits in size and echotexture.  Right kidney:  11.6 cm in length; normal in size, configuration and parenchymal echogenicity.  No evidence of hydronephrosis.  A 1.4 cm exophytic cyst is noted arising at the lower pole of the right kidney.  Left kidney:  12.0 cm in length; normal in size, configuration and parenchymal echogenicity.  No evidence of hydronephrosis.  Several cysts are noted arising from the left kidney, the largest of which measures 3.0 x 1.8 x 1.7 cm.  Abdominal Aorta:  Normal in caliber; no aneurysm identified.  IMPRESSION:  1.  Suggestion of diffuse decreased echogenicity about the pancreas, which may reflect soft tissue edema and trace fluid. Elevated lipase raises suspicion for underlying pancreatitis. 2.   Echogenic sludge and stones seen within the gallbladder, with gallbladder wall thickening.  Evaluation for ultrasonographic Murphy's sign is equivocal, as the patient is sedated. Cholecystitis cannot be excluded, though no pericholecystic fluid is seen. 3.  Bilateral renal cysts noted.  Original Report Authenticated By: Tonia Ghent, M.D.   Dg Abd Acute W/chest  04/13/2011  *RADIOLOGY REPORT*  Clinical Data: Abdominal pain and nausea  ACUTE ABDOMEN SERIES (ABDOMEN 2 VIEW & CHEST 1 VIEW)  Comparison: None.  Findings: Normal mediastinum and heart silhouette.  There is a peribronchial thickening at the lung bases.  No free air beneath hemidiaphragms.  No dilated loops of large or small bowel. Moderate volume stool in the ascending colon.  There is a calculus in the region of the left renal hilum measuring 7 mm unchanged. Brachytherapy seeds in the prostate.  IMPRESSION:  1.  No evidence of bowel obstruction or intraperitoneal free air. 2.  Mild bronchitis or bronchiectasis at the lung bases.  3.  Mild constipation.  Original Report Authenticated By: Genevive Bi, M.D.    Review of Systems  Constitutional: Negative.   HENT: Negative.   Eyes: Negative.   Respiratory: Negative.   Cardiovascular: Negative.  Orthopnea: small rash RLE.  Gastrointestinal: Positive for nausea (some nausea and dry heaves since admit toER, NO emesis.) and abdominal pain (below umbilicus from 8:30 PM till RX in ER.  Pain free now.). Negative for heartburn, diarrhea, constipation, blood in stool and melena.  Musculoskeletal: Positive for back pain (Started last PM with pain across shoulders. fAfter 1 hour pain below umbilicus.). Negative for joint pain and falls.  Skin: Positive for rash.  Neurological: Negative.   Endo/Heme/Allergies: Negative.   Psychiatric/Behavioral: Negative.    Blood pressure 142/87, pulse 89, temperature 98.2 F (36.8 C), temperature source Oral, resp. rate 18, SpO2 99.00%. Physical Exam    Constitutional: He is oriented to person, place, and time. He appears well-developed and well-nourished. No distress.  Neck: Normal range of motion. Neck supple. No JVD present. No tracheal deviation present. No thyromegaly present.  Cardiovascular: Normal rate.  Exam reveals no gallop and no friction rub.   Murmur (I/VI loudest at apex goes to axilla) heard.      Irregular rate.   GI: Soft. Bowel sounds are normal. He exhibits no distension and no mass. There is no tenderness. There is no rebound and no guarding.  Genitourinary:       Some incontinence.  Musculoskeletal: He exhibits edema (trace to +1 edema both lower extremities.). He exhibits no tenderness.  Lymphadenopathy:    He has no cervical adenopathy.  Neurological: He is alert and oriented to person, place, and time. He has normal reflexes. No cranial nerve deficit.  Skin: Skin is warm and dry. He is not diaphoretic.  Psychiatric: He has a normal mood and affect. His behavior is normal. Judgment and thought content normal.    Assessment/Plan: 1. Abdominal pain 2. Cholelithiasis, possible cholecystitis. 3. UTI 4. Kidney stones.  5. Recent hospitalization with acute sepsis, shock, acute renal failure, and atrial fibrillation. 6. History of hypertension. 7. History of hiatal hernia. 8. History of prostate cancer with brachii therapy and incontinence. 9. Hard of hearing 10. Chronic renal insufficiency. Plan: Agree with medical management at this time. His white count is normal, he has never had anything like this before. We'll repeat his lipase LFTs and labs in the morning and follow with you. [facility] physician assistant for Dr. Jimmye Norman.     Natalie Mceuen 04/13/2011, 8:18 AM

## 2011-04-13 NOTE — ED Notes (Signed)
To [place] and back on stretcher.

## 2011-04-13 NOTE — ED Notes (Signed)
Report given to [facility].  Admitting MD in room talking with pt/spouse.

## 2011-04-13 NOTE — H&P (Signed)
[facility] Attending Admission Note: Denny Levy MD [phone] pager office [phone] I  have seen and examined this patient, reviewed their chart. I have discussed this patient with the resident. I agree with the resident's findings, assessment and care plan. Surgery has evaluated and plans possible intervention---cholecystectomy--no earlier than Friday. Will treat for UTI.

## 2011-04-13 NOTE — ED Notes (Signed)
Family at bedside. 

## 2011-04-13 NOTE — H&P (Signed)
Richard Reed is an 77 y.o. male.    Chief Complaint: Abdominal Pain  HPI: Richard Reed present this morning with his wife for abdominal pain that started yesterday evening around 7:00 PM, about 2 hours after eating a meal. It started in his right scapula and then moved to his epigastric area. It was severe in nature, and associated with nausea. The pain was not relieved by tylenol. However, he states that is was relieved by a pain medication that he received in the ED. At the time of this interview, Richard Reed is pain free. He denies fever, vomiting, diarrhea, constipation, urinary retention, chest pain, shortness of breath, and any known sick contacts. His wife has accompanied him to the ED and states that since his discharge, he had a foley catheter removed and was treated for a "yeast" infection of his bladder. However, she does not remember the medication which was prescribed by his urologist. Aside from that, no changes were made to his medical treatment.   PCP: Earl Lites, MD at [facility] Cardiologist: Runell Gess, MD @ [facility]  Urologist: [facility]   Past Medical History  Diagnosis Date  . Irregular heart rate   . Kidney stones   . Hard of hearing   . CKD (chronic kidney disease)   . HTN (hypertension)   . Hiatal hernia   . Sepsis due to Escherichia coli 03/24/11    History reviewed. No pertinent past surgical history.  Family History  Problem Relation Age of Onset  . Heart failure Mother    Social History:  reports that he has never smoked. He has never used smokeless tobacco. He reports that he does not drink alcohol or use illicit drugs.  Allergies: No Known Allergies  Medications Prior to Admission  Medication Dose Route Frequency Provider Last Rate Last Dose  . cefTRIAXone (ROCEPHIN) 1 g in dextrose 5 % 50 mL IVPB  1 g Intravenous Once Nat Christen, MD   1 g at 04/13/11 0615  . fentaNYL (SUBLIMAZE) injection 50 mcg  50 mcg  Intravenous Once Nat Christen, MD   50 mcg at 04/13/11 0043  . metroNIDAZOLE (FLAGYL) IVPB 500 mg  500 mg Intravenous Once Nat Christen, MD   500 mg at 04/13/11 0707  . ondansetron (ZOFRAN) injection 4 mg  4 mg Intravenous Once Nat Christen, MD   4 mg at 04/13/11 0031   Medications Prior to Admission  Medication Sig Dispense Refill  . metoprolol succinate (TOPROL-XL) 12.5 mg TB24 Take 0.5 tablets (12.5 mg total) by mouth daily.  30 tablet  0  . Tamsulosin HCl (FLOMAX) 0.4 MG CAPS Take 2 capsules (0.8 mg total) by mouth daily after breakfast.  30 capsule  0    Results for orders placed during the hospital encounter of 04/12/11 (from the past 48 hour(s))  CBC     Status: Abnormal   Collection Time   04/12/11 11:57 PM      Component Value Range Comment   WBC 4.9  4.0 - 10.5 (K/uL)    RBC 3.66 (*) 4.22 - 5.81 (MIL/uL)    Hemoglobin 11.5 (*) 13.0 - 17.0 (g/dL)    HCT 40.9 (*) 81.1 - 52.0 (%)    MCV 93.7  78.0 - 100.0 (fL)    MCH 31.4  26.0 - 34.0 (pg)    MCHC 33.5  30.0 - 36.0 (g/dL)    RDW 91.4  78.2 - 95.6 (%)  Platelets 173  150 - 400 (K/uL)   DIFFERENTIAL     Status: Normal   Collection Time   04/12/11 11:57 PM      Component Value Range Comment   Neutrophils Relative 69  43 - 77 (%)    Neutro Abs 3.4  1.7 - 7.7 (K/uL)    Lymphocytes Relative 17  12 - 46 (%)    Lymphs Abs 0.8  0.7 - 4.0 (K/uL)    Monocytes Relative 11  3 - 12 (%)    Monocytes Absolute 0.5  0.1 - 1.0 (K/uL)    Eosinophils Relative 2  0 - 5 (%)    Eosinophils Absolute 0.1  0.0 - 0.7 (K/uL)    Basophils Relative 0  0 - 1 (%)    Basophils Absolute 0.0  0.0 - 0.1 (K/uL)   COMPREHENSIVE METABOLIC PANEL     Status: Abnormal   Collection Time   04/12/11 11:57 PM      Component Value Range Comment   Sodium 138  135 - 145 (mEq/L)    Potassium 4.6  3.5 - 5.1 (mEq/L)    Chloride 102  96 - 112 (mEq/L)    CO2 27  19 - 32 (mEq/L)    Glucose, Bld 121 (*) 70 - 99 (mg/dL)    BUN 24 (*) 6 - 23 (mg/dL)     Creatinine, Ser 1.61 (*) 0.50 - 1.35 (mg/dL)    Calcium 9.1  8.4 - 10.5 (mg/dL)    Total Protein 6.4  6.0 - 8.3 (g/dL)    Albumin 2.8 (*) 3.5 - 5.2 (g/dL)    AST 15  0 - 37 (U/L)    ALT 14  0 - 53 (U/L)    Alkaline Phosphatase 96  39 - 117 (U/L)    Total Bilirubin 0.6  0.3 - 1.2 (mg/dL)    GFR calc non Af Amer 43 (*) >90 (mL/min)    GFR calc Af Amer 49 (*) >90 (mL/min)   LIPASE, BLOOD     Status: Abnormal   Collection Time   04/12/11 11:57 PM      Component Value Range Comment   Lipase 125 (*) 11 - 59 (U/L)   CARDIAC PANEL(CRET KIN+CKTOT+MB+TROPI)     Status: Normal   Collection Time   04/12/11 11:58 PM      Component Value Range Comment   Total CK 23  7 - 232 (U/L)    CK, MB 2.4  0.3 - 4.0 (ng/mL)    Troponin I <0.30  <0.30 (ng/mL)    Relative Index RELATIVE INDEX IS INVALID  0.0 - 2.5    URINALYSIS, ROUTINE W REFLEX MICROSCOPIC     Status: Abnormal   Collection Time   04/13/11  2:20 AM      Component Value Range Comment   Color, Urine YELLOW  YELLOW     APPearance CLOUDY (*) CLEAR     Specific Gravity, Urine 1.015  1.005 - 1.030     pH 7.0  5.0 - 8.0     Glucose, UA NEGATIVE  NEGATIVE (mg/dL)    Hgb urine dipstick SMALL (*) NEGATIVE     Bilirubin Urine NEGATIVE  NEGATIVE     Ketones, ur NEGATIVE  NEGATIVE (mg/dL)    Protein, ur NEGATIVE  NEGATIVE (mg/dL)    Urobilinogen, UA 1.0  0.0 - 1.0 (mg/dL)    Nitrite POSITIVE (*) NEGATIVE     Leukocytes, UA LARGE (*) NEGATIVE    URINE MICROSCOPIC-ADD ON  Status: Abnormal   Collection Time   04/13/11  2:20 AM      Component Value Range Comment   WBC, UA TOO NUMEROUS TO COUNT  <3 (WBC/hpf)    RBC / HPF 7-10  <3 (RBC/hpf)    Bacteria, UA MANY (*) RARE     US Abdomen Complete  04/13/2011  *RADIOLOGY REPORT*  Clinical Data:  Abdominal pain, nausea and elevated by dates.  ABDOMINAL ULTRASOUND COMPLETE  Comparison:  CT of the abdomen and pelvis performed 08/03/2010, and abdominal radiographs performed 04/12/2011  Findings:   Gallbladder:  Echogenic sludge is noted partially filling the gallbladder, and scattered stones are seen within the gallbladder, layering above the sludge.  The largest measures 0.7 cm in size. The gallbladder wall is thickened to 0.4 cm.  Evaluation for ultrasonographic Murphy's sign is equivocal, as the patient is sedated.  No pericholecystic fluid is seen.  Common Bile Duct:  0.5 cm in diameter; within normal limits in caliber.  Liver:  Normal parenchymal echogenicity and echotexture; no focal lesions identified.  Limited Doppler evaluation demonstrates normal blood flow within the liver.  IVC:  Unremarkable in appearance.  Pancreas:  There is suggestion of diffuse decreased echogenicity about the pancreas, which may reflect soft tissue edema and trace fluid.  However, the pancreas is difficult to fully characterize.  Spleen:  10.3 cm in length; within normal limits in size and echotexture.  Right kidney:  11.6 cm in length; normal in size, configuration and parenchymal echogenicity.  No evidence of hydronephrosis.  A 1.4 cm exophytic cyst is noted arising at the lower pole of the right kidney.  Left kidney:  12.0 cm in length; normal in size, configuration and parenchymal echogenicity.  No evidence of hydronephrosis.  Several cysts are noted arising from the left kidney, the largest of which measures 3.0 x 1.8 x 1.7 cm.  Abdominal Aorta:  Normal in caliber; no aneurysm identified.  IMPRESSION:  1.  Suggestion of diffuse decreased echogenicity about the pancreas, which may reflect soft tissue edema and trace fluid. Elevated lipase raises suspicion for underlying pancreatitis. 2.  Echogenic sludge and stones seen within the gallbladder, with gallbladder wall thickening.  Evaluation for ultrasonographic Murphy's sign is equivocal, as the patient is sedated. Cholecystitis cannot be excluded, though no pericholecystic fluid is seen. 3.  Bilateral renal cysts noted.  Original Report Authenticated By: Tonia Ghent,  M.D.   Dg Abd Acute W/chest  04/13/2011  *RADIOLOGY REPORT*  Clinical Data: Abdominal pain and nausea  ACUTE ABDOMEN SERIES (ABDOMEN 2 VIEW & CHEST 1 VIEW)  Comparison: None.  Findings: Normal mediastinum and heart silhouette.  There is a peribronchial thickening at the lung bases.  No free air beneath hemidiaphragms.  No dilated loops of large or small bowel. Moderate volume stool in the ascending colon.  There is a calculus in the region of the left renal hilum measuring 7 mm unchanged. Brachytherapy seeds in the prostate.  IMPRESSION:  1.  No evidence of bowel obstruction or intraperitoneal free air. 2.  Mild bronchitis or bronchiectasis at the lung bases.  3.  Mild constipation.  Original Report Authenticated By: Genevive Bi, M.D.    Review of Systems  Constitutional: Positive for chills. Negative for fever and malaise/fatigue.  HENT: Positive for hearing loss.   Respiratory: Negative for cough and shortness of breath.   Cardiovascular: Positive for leg swelling. Negative for chest pain.  Gastrointestinal: Positive for nausea. Negative for diarrhea and constipation.  Genitourinary:  No urinary retention   Neurological: Negative for headaches.    Blood pressure 151/85, pulse 81, temperature 97.7 F (36.5 C), temperature source Oral, resp. rate 18, SpO2 94.00%. Physical Exam  Vitals reviewed. Constitutional: Vital signs are normal. He appears well-developed and well-nourished. No distress.  HENT:  Head: Normocephalic and atraumatic.  Eyes: Conjunctivae and EOM are normal. Pupils are equal, round, and reactive to light. No scleral icterus.  Neck: Carotid bruit is not present.  Cardiovascular: Normal heart sounds and intact distal pulses.  An irregularly irregular rhythm present.  Respiratory: Effort normal and breath sounds normal.  GI: Soft. Normal appearance and bowel sounds are normal. There is no tenderness. There is no CVA tenderness, no tenderness at McBurney's point and  negative Murphy's sign.  Musculoskeletal:       2+ pitting edema of feet and ankles bilaterally with scattered petechiae   Skin: Skin is warm, dry and intact. Petechiae and rash noted.       2 cysts 1cm x 1cm along left scapula; seborrheic keratosis on lumbar spine      Assessment/Plan Richard Reed is a very pleasant 77 year old gentleman who was recently discharged after treatment for urosepsis secondary to Eschericia coli who presents with biliary colic.   1. Admit as observation under Family Medicine Teaching Service  2. Abdominal Pain - given post-prandial pain with elevated lipase and spontaneous resolution of pain, this is most likely biliary colic; this is supported by u/s findings; however, it may also be a cholecystitis or pancreatitis, but the physical exam and other labs don't support this currently  - Continue Flagyl started by ED provider, consider d/c if no return of symptoms  - consultation for non-emergent operative management   - clear liquid diet, no pain medication at this point to see if pain returns, if so provide morphine   3. Urinary Tract Infection - U/A supports infection especially in setting of recent prolonged catheterization for urinary retention, possible gram pos   - follow-up urine gram stain and culture   - continue Rocephin, broaden for gram positive or pseudomonal coverage as warranted   - continue Flomax for rentention  4. Atrial Fibrillation - continue home metoprolol   5. FENGI - clear liquid diet  6. PPX - patient ambulating, start if he needs to remain inpatient; protonix   7. Dispo - pending eval by surgery and continued clinical stability  Mat Carne 04/13/2011, 7:36 AM  I have seen and examined patient with Dr. [name]. Agree with physical exam and findings above. Briefly, this is an 77 yo male with recent history of urosepsis, urinary retention who comes in with acute onset right shoulder, epigastric pain concerning for biliary colic.  1. Abdominal pain. Resolved currently. US findings and lipase c/w cholelithiasis. No leukocytosis or fever to suggest acute cholecystitis currently. Has a history of gallstones in the past, but never had cholecystectomy. Given patient's age and recent decompensation will admit for observation, surgical evaluation and advancement of diet as tolerated. Flagyl started but not likely needed. Will trend lipase and electrolytes.  2. UTI. Denies symptoms but UA concerning for repeat infection. No SIRS or fever. Will cover empirically with rocephin pending gram stain results. At risk for resistant or atypical organism with recent history of instrumentation. Denies any trouble urinating since Foley was DC. F/u urine culture results.   3. Other plans as above.  Shakera Ebrahimi 04/13/2011 8:52 AM

## 2011-04-14 LAB — URINE CULTURE: Culture  Setup Time: 201212261145

## 2011-04-14 LAB — COMPREHENSIVE METABOLIC PANEL
ALT: 11 U/L (ref 0–53)
Alkaline Phosphatase: 87 U/L (ref 39–117)
CO2: 29 mEq/L (ref 19–32)
Calcium: 8.7 mg/dL (ref 8.4–10.5)
GFR calc Af Amer: 53 mL/min — ABNORMAL LOW (ref >90)
GFR calc non Af Amer: 46 mL/min — ABNORMAL LOW (ref >90)
Glucose, Bld: 88 mg/dL (ref 70–99)
Sodium: 141 mEq/L (ref 135–145)
Total Bilirubin: 0.7 mg/dL (ref 0.3–1.2)

## 2011-04-14 LAB — CBC
Hemoglobin: 11.2 g/dL — ABNORMAL LOW (ref 13.0–17.0)
MCH: 31.4 pg (ref 26.0–34.0)
RBC: 3.57 MIL/uL — ABNORMAL LOW (ref 4.22–5.81)

## 2011-04-14 MED ORDER — PIPERACILLIN-TAZOBACTAM 3.375 G IVPB
3.3750 g | Freq: Three times a day (TID) | INTRAVENOUS | Status: DC
  Administered 2011-04-14 – 2011-04-17 (×10): 3.375 g via INTRAVENOUS
  Filled 2011-04-14 (×12): qty 50

## 2011-04-14 NOTE — Progress Notes (Signed)
Daily Progress Note Richard Reed. Richard Reed, M.D., M.B.A  Family Medicine PGY-1 Pager [phone]  Subjective: Patient lying in bed and only complains of a dry mouth; denies abdominal pain, dysuria  Objective: Vital signs in last 24 hours: Temp:  [97.9 F (36.6 C)-98.7 F (37.1 C)] 98.2 F (36.8 C) (12/27 0600) Pulse Rate:  [80-88] 80  (12/27 0600) Resp:  [20] 20  (12/27 0600) BP: (122-150)/(71-74) 131/74 mmHg (12/27 0600) SpO2:  [92 %-99 %] 99 % (12/27 0600) Weight change:  Last BM Date: 04/13/11  Intake/Output from previous day: 12/26 0701 - 12/27 0700 In: 1318.5 [P.O.:720; I.V.:548.5; IV Piggyback:50] Out: 800 [Urine:800] Intake/Output this shift:   Physical Exam: Gen: alert, oriented x 4, NAD, pleasant Cardiac: irregularly irregular Lungs: CTA-B Abd: non-distended, non-tender, NABS Extremities: 2+ pitting edema of ankles    Lab Results:  Basename 04/14/11 0530 04/12/11 2357  WBC 3.7* 4.9  HGB 11.2* 11.5*  HCT 33.9* 34.3*  PLT 176 173   BMET  Basename 04/14/11 0530 04/12/11 2357  NA 141 138  K 4.3 4.6  CL 105 102  CO2 29 27  GLUCOSE 88 121*  BUN 18 24*  CREATININE 1.38* 1.46*  CALCIUM 8.7 9.1   Urine gram stain - GNR in chains Urine Cx - pending    Medications:  I have reviewed the patient's current medications. Scheduled:   . cefTRIAXone (ROCEPHIN)  IV  1 g Intravenous Q24H  . metoprolol succinate  12.5 mg Oral Daily  . pantoprazole  40 mg Oral Q0600  . Tamsulosin HCl  0.8 mg Oral QPC breakfast  . DISCONTD: metronidazole  500 mg Intravenous Q8H  . DISCONTD: pantoprazole (PROTONIX) IV  40 mg Intravenous Q24H   Continuous:   . sodium chloride 30 mL/hr at 04/14/11 1478   GNF:[id number], acetaminophen, ondansetron (ZOFRAN) IV, ondansetron  Assessment/Plan: Richard Reed is a very pleasant 77 year old gentleman who was recently discharged after treatment for urosepsis secondary to Eschericia coli who presents with biliary colic.   1. Abdominal  Pain - resolved, etiology was biliary colic  - likely surgery for cholecystectomy on 12/28  2. Urinary Tract Infection - GNR  - switched to Zosyn for post-op coverage - continue Flomax for rentention   3. Atrial Fibrillation - continue home metoprolol  4. FENGI - regular diet as tolerated  5. PPX - SCD's; protonix  6. Dispo - pending surgery and continued clinical stability      LOS: 2 days   Richard Reed 04/14/2011, 9:16 AM

## 2011-04-14 NOTE — Progress Notes (Signed)
Richard Reed feels well.  No complaints.  He is a good candidate for laparoscopic chole and per surg is on the schedule for tomorrow.  No interventions today.

## 2011-04-14 NOTE — Progress Notes (Signed)
[facility] Progress Note    Subjective: Patient says his mouth feels dry.  Having no pain.  Objective: Vital signs in last 24 hours: Temp:  [97.9 F (36.6 C)-98.7 F (37.1 C)] 98.2 F (36.8 C) (12/27 0600) Pulse Rate:  [80-88] 80  (12/27 0600) Resp:  [20] 20  (12/27 0600) BP: (122-150)/(71-74) 131/74 mmHg (12/27 0600) SpO2:  [92 %-99 %] 99 % (12/27 0600) Last BM Date: 04/13/11  Intake/Output from previous day: 12/26 0701 - 12/27 0700 In: 1318.5 [P.O.:720; I.V.:548.5; IV Piggyback:50] Out: 800 [Urine:800] Intake/Output this shift:    General: No distress  Lungs: Clear  Abd: Soft, non-tender  Extremities: No changes  Neuro: Intact  Lab Results:  @LABLAST2(wbc:2,hgb:2,hct:2,plt:2) BMET  Basename 04/14/11 0530 04/12/11 2357  NA 141 138  K 4.3 4.6  CL 105 102  CO2 29 27  GLUCOSE 88 121*  BUN 18 24*  CREATININE 1.38* 1.46*  CALCIUM 8.7 9.1   PT/INR No results found for this basename: LABPROT:2,INR:2 in the last 72 hours ABG No results found for this basename: PHART:2,PCO2:2,PO2:2,HCO3:2 in the last 72 hours  Studies/Results: Us Abdomen Complete  04/13/2011  *RADIOLOGY REPORT*  Clinical Data:  Abdominal pain, nausea and elevated by dates.  ABDOMINAL ULTRASOUND COMPLETE  Comparison:  CT of the abdomen and pelvis performed 08/03/2010, and abdominal radiographs performed 04/12/2011  Findings:  Gallbladder:  Echogenic sludge is noted partially filling the gallbladder, and scattered stones are seen within the gallbladder, layering above the sludge.  The largest measures 0.7 cm in size. The gallbladder wall is thickened to 0.4 cm.  Evaluation for ultrasonographic Murphy's sign is equivocal, as the patient is sedated.  No pericholecystic fluid is seen.  Common Bile Duct:  0.5 cm in diameter; within normal limits in caliber.  Liver:  Normal parenchymal echogenicity and echotexture; no focal lesions identified.  Limited Doppler evaluation demonstrates normal blood flow within  the liver.  IVC:  Unremarkable in appearance.  Pancreas:  There is suggestion of diffuse decreased echogenicity about the pancreas, which may reflect soft tissue edema and trace fluid.  However, the pancreas is difficult to fully characterize.  Spleen:  10.3 cm in length; within normal limits in size and echotexture.  Right kidney:  11.6 cm in length; normal in size, configuration and parenchymal echogenicity.  No evidence of hydronephrosis.  A 1.4 cm exophytic cyst is noted arising at the lower pole of the right kidney.  Left kidney:  12.0 cm in length; normal in size, configuration and parenchymal echogenicity.  No evidence of hydronephrosis.  Several cysts are noted arising from the left kidney, the largest of which measures 3.0 x 1.8 x 1.7 cm.  Abdominal Aorta:  Normal in caliber; no aneurysm identified.  IMPRESSION:  1.  Suggestion of diffuse decreased echogenicity about the pancreas, which may reflect soft tissue edema and trace fluid. Elevated lipase raises suspicion for underlying pancreatitis. 2.  Echogenic sludge and stones seen within the gallbladder, with gallbladder wall thickening.  Evaluation for ultrasonographic Murphy's sign is equivocal, as the patient is sedated. Cholecystitis cannot be excluded, though no pericholecystic fluid is seen. 3.  Bilateral renal cysts noted.  Original Report Authenticated By: JEFFREY CHANG, M.D.   Dg Abd Acute W/chest  04/13/2011  *RADIOLOGY REPORT*  Clinical Data: Abdominal pain and nausea  ACUTE ABDOMEN SERIES (ABDOMEN 2 VIEW & CHEST 1 VIEW)  Comparison: None.  Findings: Normal mediastinum and heart silhouette.  There is a peribronchial thickening at the lung bases.  No free air beneath   hemidiaphragms.  No dilated loops of large or small bowel. Moderate volume stool in the ascending colon.  There is a calculus in the region of the left renal hilum measuring 7 mm unchanged. Brachytherapy seeds in the prostate.  IMPRESSION:  1.  No evidence of bowel obstruction or  intraperitoneal free air. 2.  Mild bronchitis or bronchiectasis at the lung bases.  3.  Mild constipation.  Original Report Authenticated By: STEWART EDMUNDS, M.D.    Anti-infectives: Anti-infectives     Start     Dose/Rate Route Frequency Ordered Stop   04/14/11 0700   cefTRIAXone (ROCEPHIN) 1 g in dextrose 5 % 50 mL IVPB        1 g 100 mL/hr over 30 Minutes Intravenous Every 24 hours 04/13/11 1125     04/13/11 1400   metroNIDAZOLE (FLAGYL) IVPB 500 mg  Status:  Discontinued        500 mg 100 mL/hr over 60 Minutes Intravenous 3 times per day 04/13/11 1125 04/13/11 1205   04/13/11 0615   metroNIDAZOLE (FLAGYL) IVPB 500 mg        500 mg 100 mL/hr over 60 Minutes Intravenous  Once 04/13/11 0604 04/13/11 0807   04/13/11 0530   cefTRIAXone (ROCEPHIN) 1 g in dextrose 5 % 50 mL IVPB        1 g 100 mL/hr over 30 Minutes Intravenous  Once 04/13/11 0524 04/13/11 0645          Assessment/Plan: s/p  Advance diet Continue ABX therapy due to Post-op infection Will allow for the patient to have clear liquids until MN.  Will likely take to OR tomorrow for cholecystectomy..  He is on Rocephin, but will switch to Zosyn  LOS: 2 days   Dearis O. Treylon Henard, III, MD, FACS [phone]--pager [phone]--office [facility] 04/14/2011  

## 2011-04-14 NOTE — Progress Notes (Addendum)
ANTIBIOTIC CONSULT NOTE - INITIAL  Pharmacy Consult for Zosyn Indication: Empiric coverage for UTI/cholecystitis  Admit Complaint: Pharmacist System-Based Medication Review: Anticoagulation:None PTA, SCDs for VTE px Infectious Disease: Zosyn Rx#1 for cholecystitis/UTI to go for cholecystectomy on 12/28. Urine gram stain showed GNR/WBC, UCx(12/26): pending. Afebrile. SCr 1.38, CrCl~30-40 ml/min. (Hx prior E.coli bacteremia) Cardiovascular:Hx HTN/Afib. BP/24h: 120-150/70, HR: 80-90. On home Toprol (no anticoag for Afib listed PTA) Endocrinology: CBGs/TSH at goal Gastrointestinal / Nutrition: Admitted with abdominal pain, to go for cholecystectomy on 12/18. Lipase elevated. Electrolytes ok. Nephrology: Hx CKD/urinary incontinence. SCr 1.38, CrCl~30-40 ml/min.  Hematology / Oncology:Hx prostate CA s/p treatment and now with urinary incontinence. On home flomax PTA Medication Issues: Pt has hx Afib mentioned in MD notes however no anticoag PTA. (f/u anticoag with Afib, CHADS2 >/= 2) Best Practices: SCDs for VTE px, home meds addressed  Assessment: 77 y.o. M to switch from Rocephin/Flagyl to Zosyn for empiric coverage for post-op infection. SCr 1.38, CrCl~30-40 ml/min.  Goal of Therapy:  Eradication of infection  Plan:  1. Zosyn 3.375g IV every 8 hours (infused over 4 hours) 2. Will continue to follow renal function, culture results, LOT, and antibiotic de-escalation plans   Georgina Pillion, PharmD, BCPS Pager: [phone] 04/14/2011 9:35 AM     No Known Allergies  Patient Measurements:   Weight~77.3 kg Ht~70 inces  Vital Signs: Temp: 98.2 F (36.8 C) (12/27 0600) Temp src: Oral (12/27 0600) BP: 131/74 mmHg (12/27 0600) Pulse Rate: 80  (12/27 0600) Intake/Output from previous day: 12/26 0701 - 12/27 0700 In: 1318.5 [P.O.:720; I.V.:548.5; IV Piggyback:50] Out: 800 [Urine:800] Intake/Output from this shift:    Labs:  Basename 04/14/11 0530 04/12/11 2357  WBC 3.7* 4.9    HGB 11.2* 11.5*  PLT 176 173  LABCREA -- --  CREATININE 1.38* 1.46*   The CrCl is unknown because both a height and weight (above a minimum accepted value) are required for this calculation. No results found for this basename: VANCOTROUGH:2,VANCOPEAK:2,VANCORANDOM:2,GENTTROUGH:2,GENTPEAK:2,GENTRANDOM:2,TOBRATROUGH:2,TOBRAPEAK:2,TOBRARND:2,AMIKACINPEAK:2,AMIKACINTROU:2,AMIKACIN:2, in the last 72 hours   Microbiology: Recent Results (from the past 720 hour(s))  MRSA PCR SCREENING     Status: Normal   Collection Time   03/23/11  9:38 PM      Component Value Range Status Comment   MRSA by PCR NEGATIVE  NEGATIVE  Final   CLOSTRIDIUM DIFFICILE BY PCR     Status: Normal   Collection Time   03/24/11  9:14 AM      Component Value Range Status Comment   C difficile by pcr NEGATIVE  NEGATIVE  Final   STOOL CULTURE     Status: Normal   Collection Time   03/24/11  9:14 AM      Component Value Range Status Comment   Specimen Description STOOL   Final    Special Requests NONE   Final    Culture     Final    Value: NO SALMONELLA, SHIGELLA, CAMPYLOBACTER, OR YERSINIA ISOLATED   Report Status 03/28/2011 FINAL   Final   CULTURE, BLOOD (ROUTINE X 2)     Status: Normal   Collection Time   03/24/11 10:30 AM      Component Value Range Status Comment   Specimen Description BLOOD CENTRAL LINE   Final    Special Requests BOTTLES DRAWN AEROBIC AND ANAEROBIC 10CC RT IJ   Final    Setup Time [id number]   Final    Culture     Final    Value: ESCHERICHIA COLI  Note: Gram Stain Report Called to,Read Back By and Verified With: MARCELLA HAIRSTON @ 0020 ON 03/25/11 BY GOLLD   Report Status 03/27/2011 FINAL   Final    Organism ID, Bacteria ESCHERICHIA COLI   Final   CULTURE, BLOOD (ROUTINE X 2)     Status: Normal   Collection Time   03/24/11 10:30 AM      Component Value Range Status Comment   Specimen Description BLOOD ARM LEFT   Final    Special Requests BOTTLES DRAWN AEROBIC AND ANAEROBIC 10CC EACH    Final    Setup Time [id number]   Final    Culture     Final    Value: ESCHERICHIA COLI     Note: SUSCEPTIBILITIES PERFORMED ON PREVIOUS CULTURE WITHIN THE LAST 5 DAYS.     Note: Gram Stain Report Called to,Read Back By and Verified With: MISTY REAGAN @0135  ON 03/25/11 BY MCLET   Report Status 03/27/2011 FINAL   Final   TECHNOLOGIST SMEAR REVIEW     Status: Normal   Collection Time   03/24/11  3:30 PM      Component Value Range Status Comment   Path Review INCREASED BANDS (>20% BANDS)   Final   CULTURE, BAL-QUANTITATIVE     Status: Normal   Collection Time   03/24/11  3:31 PM      Component Value Range Status Comment   Specimen Description BRONCHIAL ALVEOLAR LAVAGE   Final    Special Requests NONE   Final    Gram Stain     Final    Value: RARE WBC PRESENT,BOTH PMN AND MONONUCLEAR     NO SQUAMOUS EPITHELIAL CELLS SEEN     NO ORGANISMS SEEN   Colony Count 5,000 COLONIES/ML   Final    Culture Non-Pathogenic Oropharyngeal-type Flora Isolated.   Final    Report Status 03/26/2011 FINAL   Final   RESPIRATORY VIRUS PANEL (18 COMPONENTS)     Status: Normal   Collection Time   03/24/11  3:32 PM      Component Value Range Status Comment   Source - RVPAN BRONCHIAL ALVEOLAR LAVAGE   Final    Respiratory Syncytial Virus A NOT DETECTED   Final    Respiratory Syncytial Virus B NOT DETECTED   Final    Influenza A NOT DETECTED   Final    Influenza B NOT DETECTED   Final    Parainfluenza 1 NOT DETECTED   Final    Parainfluenza 2 NOT DETECTED   Final    Parainfluenza 3 NOT DETECTED   Final    Parainfluenza 4 NOT DETECTED   Final    Metapneumovirus NOT DETECTED   Final    [place] and Echovirus NOT DETECTED   Final    Rhinovirus NOT DETECTED   Final    Adenovirus B NOT DETECTED   Final    Adenovirus E NOT DETECTED   Final    CoronavirusNL63 NOT DETECTED   Final    CoronavirusHKU1 NOT DETECTED   Final    Coronavirus229E NOT DETECTED   Final    CoronavirusOC43 NOT DETECTED   Final    Bocavirus  NOT DETECTED   Final   AFB CULTURE WITH SMEAR     Status: Normal (Preliminary result)   Collection Time   03/24/11  3:32 PM      Component Value Range Status Comment   Specimen Description BRONCHIAL ALVEOLAR LAVAGE   Final    Special Requests NONE   Final  ACID FAST SMEAR NO ACID FAST BACILLI SEEN   Final    Culture     Final    Value: CULTURE WILL BE EXAMINED FOR 6 WEEKS BEFORE ISSUING A FINAL REPORT   Report Status PENDING   Incomplete   FUNGUS CULTURE W SMEAR     Status: Normal (Preliminary result)   Collection Time   03/24/11  3:32 PM      Component Value Range Status Comment   Specimen Description BRONCHIAL ALVEOLAR LAVAGE   Final    Special Requests NONE   Final    Fungal Smear NO YEAST OR FUNGAL ELEMENTS SEEN   Final    Culture CULTURE IN PROGRESS FOR FOUR WEEKS   Final    Report Status PENDING   Incomplete   PATHOLOGIST SMEAR REVIEW     Status: Normal   Collection Time   03/24/11  3:32 PM      Component Value Range Status Comment   Tech Review     Final    Value: ALVEOLAR MACROPHAGE AND OCCASIONAL RESPIRATORY EPITHELIAL CELLS.  GRAM STAIN     Status: Normal   Collection Time   04/13/11  9:42 AM      Component Value Range Status Comment   Specimen Description URINE, RANDOM   Final    Special Requests NONE   Final    Gram Stain     Final    Value: WBC PRESENT, PREDOMINANTLY PMN     GRAM NEGATIVE RODS IN CHAINS     SQUAMOUS EPITHELIAL CELLS PRESENT     CYTOSPIN URINE     CALLED TO S.WILSON, RN 12.26.12 1212 [place]   Report Status 04/13/2011 FINAL   Final     Medical History: Past Medical History  Diagnosis Date  . Irregular heart rate   . Kidney stones   . Hard of hearing   . CKD (chronic kidney disease)   . HTN (hypertension)   . Hiatal hernia   . Sepsis due to Escherichia coli 03/24/11  . Dysrhythmia   . Angina   . Pneumonia 03/23/2011    "first time"    Medications:  Prescriptions prior to admission  Medication Sig Dispense Refill  . metoprolol  succinate (TOPROL-XL) 12.5 mg TB24 Take 0.5 tablets (12.5 mg total) by mouth daily.  30 tablet  0  . Tamsulosin HCl (FLOMAX) 0.4 MG CAPS Take 2 capsules (0.8 mg total) by mouth daily after breakfast.  30 capsule  0   Scheduled:    . metoprolol succinate  12.5 mg Oral Daily  . pantoprazole  40 mg Oral Q0600  . Tamsulosin HCl  0.8 mg Oral QPC breakfast  . DISCONTD: cefTRIAXone (ROCEPHIN)  IV  1 g Intravenous Q24H  . DISCONTD: metronidazole  500 mg Intravenous Q8H  . DISCONTD: pantoprazole (PROTONIX) IV  40 mg Intravenous Q24H

## 2011-04-15 ENCOUNTER — Other Ambulatory Visit (INDEPENDENT_AMBULATORY_CARE_PROVIDER_SITE_OTHER): Payer: Self-pay | Admitting: General Surgery

## 2011-04-15 ENCOUNTER — Observation Stay (HOSPITAL_COMMUNITY): Payer: Medicare Other | Admitting: Anesthesiology

## 2011-04-15 ENCOUNTER — Encounter (HOSPITAL_COMMUNITY)
Admission: EM | Disposition: A | Discharge status: Home / Self Care | Payer: Self-pay | Source: Home / Self Care | Attending: Family Medicine

## 2011-04-15 ENCOUNTER — Observation Stay (HOSPITAL_COMMUNITY): Payer: Medicare Other

## 2011-04-15 ENCOUNTER — Encounter (HOSPITAL_COMMUNITY): Payer: Self-pay | Admitting: Anesthesiology

## 2011-04-15 DIAGNOSIS — K824 Cholesterolosis of gallbladder: Secondary | ICD-10-CM

## 2011-04-15 DIAGNOSIS — K8010 Calculus of gallbladder with chronic cholecystitis without obstruction: Secondary | ICD-10-CM

## 2011-04-15 HISTORY — PX: CHOLECYSTECTOMY: SHX55

## 2011-04-15 SURGERY — LAPAROSCOPIC CHOLECYSTECTOMY WITH INTRAOPERATIVE CHOLANGIOGRAM
Anesthesia: General | Site: Abdomen | Wound class: Contaminated

## 2011-04-15 MED ORDER — FENTANYL CITRATE 0.05 MG/ML IJ SOLN
INTRAMUSCULAR | Status: DC | PRN
  Administered 2011-04-15: 125 ug via INTRAVENOUS
  Administered 2011-04-15: 50 ug via INTRAVENOUS
  Administered 2011-04-15: 75 ug via INTRAVENOUS
  Administered 2011-04-15: 50 ug via INTRAVENOUS

## 2011-04-15 MED ORDER — HYDROMORPHONE HCL PF 1 MG/ML IJ SOLN: 0.2500 mg | INTRAMUSCULAR | Status: DC | PRN

## 2011-04-15 MED ORDER — HYDROMORPHONE HCL PF 1 MG/ML IJ SOLN: 0.5000 mg | INTRAMUSCULAR | Status: DC | PRN

## 2011-04-15 MED ORDER — HYDROMORPHONE HCL PF 1 MG/ML IJ SOLN
0.2500 mg | INTRAMUSCULAR | Status: DC | PRN
  Administered 2011-04-15: 0.5 mg via INTRAVENOUS

## 2011-04-15 MED ORDER — LACTATED RINGERS IV SOLN
INTRAVENOUS | Status: DC | PRN
  Administered 2011-04-15 (×2): via INTRAVENOUS

## 2011-04-15 MED ORDER — ROCURONIUM BROMIDE 100 MG/10ML IV SOLN
INTRAVENOUS | Status: DC | PRN
  Administered 2011-04-15: 40 mg via INTRAVENOUS

## 2011-04-15 MED ORDER — ONDANSETRON HCL 4 MG/2ML IJ SOLN
INTRAMUSCULAR | Status: DC | PRN
  Administered 2011-04-15: 4 mg via INTRAVENOUS

## 2011-04-15 MED ORDER — IOHEXOL 300 MG/ML  SOLN
INTRAMUSCULAR | Status: DC | PRN
  Administered 2011-04-15: 8 mL

## 2011-04-15 MED ORDER — PROPOFOL 10 MG/ML IV EMUL
INTRAVENOUS | Status: DC | PRN
  Administered 2011-04-15: 130 mg via INTRAVENOUS

## 2011-04-15 MED ORDER — SODIUM CHLORIDE 0.9 % IR SOLN
Status: DC | PRN
  Administered 2011-04-15: 1000 mL

## 2011-04-15 MED ORDER — LACTATED RINGERS IV SOLN
INTRAVENOUS | Status: DC
  Administered 2011-04-15: 10:00:00 via INTRAVENOUS

## 2011-04-15 MED ORDER — HYDROCODONE-ACETAMINOPHEN 5-325 MG PO TABS
1.0000 | ORAL_TABLET | ORAL | Status: DC | PRN
  Administered 2011-04-15: 2 via ORAL
  Administered 2011-04-16: 1 via ORAL
  Administered 2011-04-16: 2 via ORAL
  Filled 2011-04-15: qty 1
  Filled 2011-04-15 (×2): qty 2

## 2011-04-15 MED ORDER — PHENYLEPHRINE HCL 10 MG/ML IJ SOLN
INTRAMUSCULAR | Status: DC | PRN
  Administered 2011-04-15 (×3): 80 ug via INTRAVENOUS

## 2011-04-15 MED ORDER — GLYCOPYRROLATE 0.2 MG/ML IJ SOLN
INTRAMUSCULAR | Status: DC | PRN
  Administered 2011-04-15: .6 mg via INTRAVENOUS

## 2011-04-15 MED ORDER — 0.9 % SODIUM CHLORIDE (POUR BTL) OPTIME
TOPICAL | Status: DC | PRN
  Administered 2011-04-15: 1000 mL

## 2011-04-15 MED ORDER — KCL IN DEXTROSE-NACL 20-5-0.45 MEQ/L-%-% IV SOLN
INTRAVENOUS | Status: DC
  Administered 2011-04-15 – 2011-04-17 (×2): via INTRAVENOUS
  Filled 2011-04-15 (×8): qty 1000

## 2011-04-15 MED ORDER — NEOSTIGMINE METHYLSULFATE 1 MG/ML IJ SOLN
INTRAMUSCULAR | Status: DC | PRN
  Administered 2011-04-15: 4 mg via INTRAVENOUS

## 2011-04-15 MED ORDER — BUPIVACAINE-EPINEPHRINE 0.25% -1:200000 IJ SOLN
INTRAMUSCULAR | Status: DC | PRN
  Administered 2011-04-15: 20 mL

## 2011-04-15 SURGICAL SUPPLY — 54 items
ADH SKN CLS APL DERMABOND .7 (GAUZE/BANDAGES/DRESSINGS)
APPLIER CLIP 5 13 M/L LIGAMAX5 (MISCELLANEOUS) ×2
APPLIER CLIP ROT 10 11.4 M/L (STAPLE)
APR CLP MED LRG 11.4X10 (STAPLE)
APR CLP MED LRG 5 ANG JAW (MISCELLANEOUS) ×1
BAG SPEC RTRVL LRG 6X4 10 (ENDOMECHANICALS)
BLADE SURG ROTATE 9660 (MISCELLANEOUS) IMPLANT
CANISTER SUCTION 2500CC (MISCELLANEOUS) ×2 IMPLANT
CHLORAPREP W/TINT 26ML (MISCELLANEOUS) ×2 IMPLANT
CLIP APPLIE 5 13 M/L LIGAMAX5 (MISCELLANEOUS) ×1 IMPLANT
CLIP APPLIE ROT 10 11.4 M/L (STAPLE) IMPLANT
CLOSURE STERI STRIP 1/2 X4 (GAUZE/BANDAGES/DRESSINGS) ×1 IMPLANT
CLOTH BEACON ORANGE TIMEOUT ST (SAFETY) ×2 IMPLANT
COVER MAYO STAND STRL (DRAPES) ×2 IMPLANT
COVER SURGICAL LIGHT HANDLE (MISCELLANEOUS) ×2 IMPLANT
DECANTER SPIKE VIAL GLASS SM (MISCELLANEOUS) IMPLANT
DERMABOND ADVANCED (GAUZE/BANDAGES/DRESSINGS)
DERMABOND ADVANCED .7 DNX12 (GAUZE/BANDAGES/DRESSINGS) IMPLANT
DRAPE C-ARM 42X72 X-RAY (DRAPES) ×2 IMPLANT
DRAPE UTILITY 15X26 W/TAPE STR (DRAPE) ×4 IMPLANT
DRSG TEGADERM 2-3/8X2-3/4 SM (GAUZE/BANDAGES/DRESSINGS) ×3 IMPLANT
DRSG TEGADERM 4X4.75 (GAUZE/BANDAGES/DRESSINGS) ×1 IMPLANT
ELECT REM PT RETURN 9FT ADLT (ELECTROSURGICAL) ×2
ELECTRODE REM PT RTRN 9FT ADLT (ELECTROSURGICAL) ×1 IMPLANT
GLOVE BIO SURGEON STRL SZ 6.5 (GLOVE) ×2 IMPLANT
GLOVE BIO SURGEON STRL SZ7.5 (GLOVE) ×1 IMPLANT
GLOVE BIOGEL PI IND STRL 7.0 (GLOVE) IMPLANT
GLOVE BIOGEL PI IND STRL 7.5 (GLOVE) ×1 IMPLANT
GLOVE BIOGEL PI IND STRL 8 (GLOVE) ×1 IMPLANT
GLOVE BIOGEL PI INDICATOR 7.0 (GLOVE) ×3
GLOVE BIOGEL PI INDICATOR 7.5 (GLOVE) ×1
GLOVE BIOGEL PI INDICATOR 8 (GLOVE) ×1
GLOVE ECLIPSE 7.5 STRL STRAW (GLOVE) ×2 IMPLANT
GLOVE SURG SS PI 7.0 STRL IVOR (GLOVE) ×1 IMPLANT
GOWN PREVENTION PLUS XLARGE (GOWN DISPOSABLE) ×1 IMPLANT
GOWN STRL NON-REIN LRG LVL3 (GOWN DISPOSABLE) ×7 IMPLANT
KIT BASIN OR (CUSTOM PROCEDURE TRAY) ×2 IMPLANT
KIT ROOM TURNOVER OR (KITS) ×2 IMPLANT
NS IRRIG 1000ML POUR BTL (IV SOLUTION) ×2 IMPLANT
PAD ARMBOARD 7.5X6 YLW CONV (MISCELLANEOUS) ×4 IMPLANT
POUCH SPECIMEN RETRIEVAL 10MM (ENDOMECHANICALS) IMPLANT
SCISSORS LAP 5X35 DISP (ENDOMECHANICALS) IMPLANT
SET CHOLANGIOGRAPH 5 50 .035 (SET/KITS/TRAYS/PACK) ×2 IMPLANT
SET IRRIG TUBING LAPAROSCOPIC (IRRIGATION / IRRIGATOR) ×2 IMPLANT
SLEEVE ENDOPATH XCEL 5M (ENDOMECHANICALS) ×4 IMPLANT
SPECIMEN JAR SMALL (MISCELLANEOUS) ×2 IMPLANT
SUT MNCRL AB 4-0 PS2 18 (SUTURE) ×2 IMPLANT
TOWEL OR 17X24 6PK STRL BLUE (TOWEL DISPOSABLE) ×2 IMPLANT
TOWEL OR 17X26 10 PK STRL BLUE (TOWEL DISPOSABLE) ×2 IMPLANT
TRAY LAPAROSCOPIC (CUSTOM PROCEDURE TRAY) ×2 IMPLANT
TROCAR XCEL BLUNT TIP 100MML (ENDOMECHANICALS) ×2 IMPLANT
TROCAR XCEL NON-BLD 11X100MML (ENDOMECHANICALS) ×2 IMPLANT
TROCAR XCEL NON-BLD 5MMX100MML (ENDOMECHANICALS) ×2 IMPLANT
WATER STERILE IRR 1000ML POUR (IV SOLUTION) IMPLANT

## 2011-04-15 NOTE — Preoperative (Signed)
Beta Blockers   Reason not to administer Beta Blockers:Not Applicable 

## 2011-04-15 NOTE — Progress Notes (Signed)
Report given to maria rn as caregiver 

## 2011-04-15 NOTE — Anesthesia Postprocedure Evaluation (Signed)
  Anesthesia Post-op Note  Patient: Richard Reed  Procedure(s) Performed:  LAPAROSCOPIC CHOLECYSTECTOMY WITH INTRAOPERATIVE CHOLANGIOGRAM  Patient Location: PACU  Anesthesia Type: General  Level of Consciousness: awake, alert  and oriented  Airway and Oxygen Therapy: Patient Spontanous Breathing and Patient connected to nasal cannula oxygen  Post-op Pain: mild  Post-op Assessment: Post-op Vital signs reviewed and Patient's Cardiovascular Status Stable  Post-op Vital Signs: stable  Complications: No apparent anesthesia complications

## 2011-04-15 NOTE — H&P (View-Only) (Signed)
[facility]/Richard Reed Progress Note    Subjective: Patient says his mouth feels dry.  Having no pain.  Objective: Vital signs in last 24 hours: Temp:  [97.9 F (36.6 C)-98.7 F (37.1 C)] 98.2 F (36.8 C) (12/27 0600) Pulse Rate:  [80-88] 80  (12/27 0600) Resp:  [20] 20  (12/27 0600) BP: (122-150)/(71-74) 131/74 mmHg (12/27 0600) SpO2:  [92 %-99 %] 99 % (12/27 0600) Last BM Date: 04/13/11  Intake/Output from previous day: 12/26 0701 - 12/27 0700 In: 1318.5 [P.O.:720; I.V.:548.5; IV Piggyback:50] Out: 800 [Urine:800] Intake/Output this shift:    General: No distress  Lungs: Clear  Abd: Soft, non-tender  Extremities: No changes  Neuro: Intact  Lab Results:  @LABLAST2 (wbc:2,hgb:2,hct:2,plt:2) BMET  Basename 04/14/11 0530 04/12/11 2357  NA 141 138  K 4.3 4.6  CL 105 102  CO2 29 27  GLUCOSE 88 121*  BUN 18 24*  CREATININE 1.38* 1.46*  CALCIUM 8.7 9.1   PT/INR No results found for this basename: LABPROT:2,INR:2 in the last 72 hours ABG No results found for this basename: PHART:2,PCO2:2,PO2:2,HCO3:2 in the last 72 hours  Studies/Results: US Abdomen Complete  04/13/2011  *RADIOLOGY REPORT*  Clinical Data:  Abdominal pain, nausea and elevated by dates.  ABDOMINAL ULTRASOUND COMPLETE  Comparison:  CT of the abdomen and pelvis performed 08/03/2010, and abdominal radiographs performed 04/12/2011  Findings:  Gallbladder:  Echogenic sludge is noted partially filling the gallbladder, and scattered stones are seen within the gallbladder, layering above the sludge.  The largest measures 0.7 cm in size. The gallbladder wall is thickened to 0.4 cm.  Evaluation for ultrasonographic Murphy's sign is equivocal, as the patient is sedated.  No pericholecystic fluid is seen.  Common Bile Duct:  0.5 cm in diameter; within normal limits in caliber.  Liver:  Normal parenchymal echogenicity and echotexture; no focal lesions identified.  Limited Doppler evaluation demonstrates normal blood flow within  the liver.  IVC:  Unremarkable in appearance.  Pancreas:  There is suggestion of diffuse decreased echogenicity about the pancreas, which may reflect soft tissue edema and trace fluid.  However, the pancreas is difficult to fully characterize.  Spleen:  10.3 cm in length; within normal limits in size and echotexture.  Right kidney:  11.6 cm in length; normal in size, configuration and parenchymal echogenicity.  No evidence of hydronephrosis.  A 1.4 cm exophytic cyst is noted arising at the lower pole of the right kidney.  Left kidney:  12.0 cm in length; normal in size, configuration and parenchymal echogenicity.  No evidence of hydronephrosis.  Several cysts are noted arising from the left kidney, the largest of which measures 3.0 x 1.8 x 1.7 cm.  Abdominal Aorta:  Normal in caliber; no aneurysm identified.  IMPRESSION:  1.  Suggestion of diffuse decreased echogenicity about the pancreas, which may reflect soft tissue edema and trace fluid. Elevated lipase raises suspicion for underlying pancreatitis. 2.  Echogenic sludge and stones seen within the gallbladder, with gallbladder wall thickening.  Evaluation for ultrasonographic Murphy's sign is equivocal, as the patient is sedated. Cholecystitis cannot be excluded, though no pericholecystic fluid is seen. 3.  Bilateral renal cysts noted.  Original Report Authenticated By: Tonia Ghent, M.D.   Dg Abd Acute W/chest  04/13/2011  *RADIOLOGY REPORT*  Clinical Data: Abdominal pain and nausea  ACUTE ABDOMEN SERIES (ABDOMEN 2 VIEW & CHEST 1 VIEW)  Comparison: None.  Findings: Normal mediastinum and heart silhouette.  There is a peribronchial thickening at the lung bases.  No free air beneath  hemidiaphragms.  No dilated loops of large or small bowel. Moderate volume stool in the ascending colon.  There is a calculus in the region of the left renal hilum measuring 7 mm unchanged. Brachytherapy seeds in the prostate.  IMPRESSION:  1.  No evidence of bowel obstruction or  intraperitoneal free air. 2.  Mild bronchitis or bronchiectasis at the lung bases.  3.  Mild constipation.  Original Report Authenticated By: Genevive Bi, M.D.    Anti-infectives: Anti-infectives     Start     Dose/Rate Route Frequency Ordered Stop   04/14/11 0700   cefTRIAXone (ROCEPHIN) 1 g in dextrose 5 % 50 mL IVPB        1 g 100 mL/hr over 30 Minutes Intravenous Every 24 hours 04/13/11 1125     04/13/11 1400   metroNIDAZOLE (FLAGYL) IVPB 500 mg  Status:  Discontinued        500 mg 100 mL/hr over 60 Minutes Intravenous 3 times per day 04/13/11 1125 04/13/11 1205   04/13/11 0615   metroNIDAZOLE (FLAGYL) IVPB 500 mg        500 mg 100 mL/hr over 60 Minutes Intravenous  Once 04/13/11 0604 04/13/11 0807   04/13/11 0530   cefTRIAXone (ROCEPHIN) 1 g in dextrose 5 % 50 mL IVPB        1 g 100 mL/hr over 30 Minutes Intravenous  Once 04/13/11 0524 04/13/11 0645          Assessment/Plan: s/p  Advance diet Continue ABX therapy due to Post-op infection Will allow for the patient to have clear liquids until MN.  Will likely take to OR tomorrow for cholecystectomy.Marland Kitchen  He is on Rocephin, but will switch to Zosyn  LOS: 2 days   Marta Lamas. Gae Bon, MD, FACS [phone] [facility] 04/14/2011

## 2011-04-15 NOTE — Interval H&P Note (Signed)
History and Physical Interval Note:  04/15/2011 7:42 AM  Richard Reed  has presented today for surgery, with the diagnosis of Symptomatic cholelithiasis  The various methods of treatment have been discussed with the patient and family. After consideration of risks, benefits and other options for treatment, the patient has consented to  Procedure(s): LAPAROSCOPIC CHOLECYSTECTOMY WITH INTRAOPERATIVE CHOLANGIOGRAM as a surgical intervention .  The patients' history has been reviewed, patient examined, no change in status, stable for surgery.  I have reviewed the patients' chart and labs.  Questions were answered to the patient's satisfaction.     Kimberlynn Lumbra III,Atzin O

## 2011-04-15 NOTE — Progress Notes (Signed)
Daily Progress Note Everrett Coombe, DO Family Medicine PGY-2 Pager [phone]  Subjective: Patient lying in bed no complaints this am.  Denies abdominal pain, nausea, dysuria.   Objective: Vital signs in last 24 hours: Temp:  [97.9 F (36.6 C)-98.3 F (36.8 C)] 97.9 F (36.6 C) (12/28 0600) Pulse Rate:  [51-86] 80  (12/28 0600) Resp:  [18-20] 20  (12/28 0600) BP: (117-128)/(67-74) 117/68 mmHg (12/28 0600) SpO2:  [93 %-96 %] 96 % (12/28 0600) Weight change:  Last BM Date: 04/13/11  Intake/Output from previous day: 12/27 0701 - 12/28 0700 In: 2423.5 [P.O.:120; I.V.:2153.5; IV Piggyback:150] Out: -  Intake/Output this shift:   Physical Exam: Gen: alert, oriented x 4, NAD, pleasant, hard of hearing Cardiac: irregularly irregular Lungs: CTA-B Abd: non-distended, non-tender, NABS Extremities: 1+ pitting edema of ankles    Lab Results:  Basename 04/14/11 0530 04/12/11 2357  WBC 3.7* 4.9  HGB 11.2* 11.5*  HCT 33.9* 34.3*  PLT 176 173   BMET  Basename 04/14/11 0530 04/12/11 2357  NA 141 138  K 4.3 4.6  CL 105 102  CO2 29 27  GLUCOSE 88 121*  BUN 18 24*  CREATININE 1.38* 1.46*  CALCIUM 8.7 9.1   Urine gram stain - GNR in chains Urine Cx - No growth   Medications:  I have reviewed the patient's current medications. Scheduled:    . metoprolol succinate  12.5 mg Oral Daily  . pantoprazole  40 mg Oral Q0600  . piperacillin-tazobactam (ZOSYN)  IV  3.375 g Intravenous Q8H  . Tamsulosin HCl  0.8 mg Oral QPC breakfast  . DISCONTD: cefTRIAXone (ROCEPHIN)  IV  1 g Intravenous Q24H   Continuous:    . sodium chloride 100 mL/hr at 04/14/11 2128   AVW:[id number], acetaminophen, ondansetron (ZOFRAN) IV, ondansetron  Assessment/Plan: Mr. Chrystal is a very pleasant 77 year old gentleman who was recently discharged after treatment for urosepsis secondary to E. coli who presents with biliary colic.   1. Abdominal Pain - resolved, etiology was biliary colic w/ mild  pancreatitis  - For laparoscopic cholecystectomy this am  2. Urinary Tract Infection - GNR  - switched to Zosyn for post-op coverage - continue Flomax for rentention   3. Atrial Fibrillation - continue home metoprolol, rate controlled 4. FENGI - NPO for now in anticipation of surgery 5. PPX - SCD's; protonix  6. Dispo - pending surgery and continued clinical stability      LOS: 3 days   Margeret Stachnik 04/15/2011, 8:36 AM

## 2011-04-15 NOTE — Anesthesia Preprocedure Evaluation (Addendum)
Anesthesia Evaluation  Patient identified by MRN, date of birth, ID band Patient awake    Reviewed: Allergy & Precautions, H&P , NPO status , Patient's Chart, lab work & pertinent test results  Airway Mallampati: II TM Distance: >3 FB Neck ROM: Full    Dental  (+) Teeth Intact   Pulmonary  clear to auscultation        Cardiovascular hypertension, + dysrhythmias Irregular Normal    Neuro/Psych  Neuromuscular disease    GI/Hepatic hiatal hernia,   Endo/Other    Renal/GU ARF and Renal InsufficiencyRenal disease     Musculoskeletal   Abdominal   Peds  Hematology   Anesthesia Other Findings   Reproductive/Obstetrics                          Anesthesia Physical Anesthesia Plan  ASA: III  Anesthesia Plan: General   Post-op Pain Management:    Induction: Intravenous  Airway Management Planned: Oral ETT  Additional Equipment:   Intra-op Plan:   Post-operative Plan: Extubation in OR  Informed Consent: I have reviewed the patients History and Physical, chart, labs and discussed the procedure including the risks, benefits and alternatives for the proposed anesthesia with the patient or authorized representative who has indicated his/her understanding and acceptance.   Dental advisory given  Plan Discussed with: CRNA and Surgeon  Anesthesia Plan Comments: (77 year old wm with h/o  PVCs, recent hospitalization with urosepsis and ARF. For lap cholecystectomy. Pt appears clinically stable Cr. 1.38.  Plan GA.  Kipp Brood, MD)        Anesthesia Quick Evaluation

## 2011-04-15 NOTE — Progress Notes (Signed)
Seen post lap chole.  Doing well.  Minimal pain.  Slight nausea.  Awake and alert.  Has not yet ambulated.    Plan to observe overnight.  Anticipate DC in am if continues uneventful recovery.

## 2011-04-15 NOTE — Op Note (Signed)
OPERATIVE REPORT  DATE OF OPERATION: 04/12/2011 - 04/15/2011  PATIENT:  Richard Reed  77 y.o. male  PRE-OPERATIVE DIAGNOSIS:  Symptomatic cholelithiasis  POST-OPERATIVE DIAGNOSIS:  Cholelithiasis and acute cholecystitis  PROCEDURE:  Procedure(s): LAPAROSCOPIC CHOLECYSTECTOMY WITH INTRAOPERATIVE CHOLANGIOGRAM  SURGEON:  Surgeon(s): Jetty Duhamel, MD  ASSISTANTOrson Slick, RNFA  ANESTHESIA:   general  EBL: 50 ml  BLOOD ADMINISTERED: none  DRAINS: none   SPECIMEN:  Source of Specimen:  gallbladder and stones  COUNTS CORRECT:  YES  PROCEDURE DETAILS: The patient was taken to the operating room and placed on the table in the supine position. After an adequate endotracheal anesthetic was administered a proper time out was performed identifying the patient and the procedure to be performed he was prepped and draped in usual sterile manner.  An infraumbilical midline incision was made using a #15 blade and taken down to the midline fascia. In size the midline fascia using a 15 blade and grabbed the edges Coker clamps. We bluntly dissected down into the peritoneal cavity using a Kelly clamp. Once this was done a pursestring suture of 0 Vicryl was passed around the fascial opening. A Hassan cannula was passed into the peritoneal cavity and secured in place with a pursestring suture. Once this was done carbon dioxide gas was insufflated into the peritoneal cavity up to a maximal pressure of 15 mm of mercury.  2 right costal margin 5 mm cannulas in the subxiphoid 11 mm cannula were passed under direct vision into the peritoneal cavity. The patient was placed in reverse Trendelenburg the left side was tilted down and the dissection begun.  The patient's gallbladder was mildly inflamed however we were able to grab it and retracted towards the anterior abdominal wall in the right upper quadrant with minimal difficulty. We dissected out the peritoneum overlying the triangle of flow and the  hepatoduodenal triangle isolating both the cystic duct and the cystic artery. The cystic artery was clipped proximally and distally x2 and transected. The cystic duct was clipped along the gallbladder side then a cholecystodochotomy made using laparoscopic scissors. Once this was done a Cook catheter which been passed through the intra-abdominal wall was passed into the cholecystodochotomy to perform the cholangiogram showed good flow into the duodenum or proximal filling and no intraductal filling defects.  Once the cholangiogram was completed the clip holding it in place was removed the plantar catheter was removed. And the distal cystic duct was clipped x3 and transected. We then dissected out the gallbladder from its bed with minimal difficulty. We did use an Endo Catch bag to retrieve the gallbladder once this was done we inspected the bed for bleeding and there was minimal bleeding. We cauterized some degree then irrigated with saline aspirated all fluid and gas from above the liver then removed the infraumbilical cannula used to Pershing suture tie of the fascia.  Once this was done we removed all of the cannulas. We injected all sites were quarter percent Marcaine with epinephrine.  All counts were correct including needle sponges and his wound. The subxiphoid and the infraumbilical sites were closed using running subcuticular stitch of 4-0 Monocryl. Dermabond Steri-Strips and Tegaderms using complete the dressing     PATIENT DISPOSITION:  PACU - hemodynamically stable.   Doria Fern III,Brittney O 12/28/201212:47 PM

## 2011-04-15 NOTE — Transfer of Care (Signed)
Immediate Anesthesia Transfer of Care Note  Patient: Richard Reed  Procedure(s) Performed:  LAPAROSCOPIC CHOLECYSTECTOMY WITH INTRAOPERATIVE CHOLANGIOGRAM  Patient Location: PACU  Anesthesia Type: General  Level of Consciousness: awake, alert  and oriented  Airway & Oxygen Therapy: Patient Spontanous Breathing and Patient connected to nasal cannula oxygen  Post-op Assessment: Report given to PACU RN, Post -op Vital signs reviewed and stable and Patient moving all extremities X 4  Post vital signs: Reviewed and stable  Complications: No apparent anesthesia complications

## 2011-04-15 NOTE — Anesthesia Procedure Notes (Signed)
Procedure Name: Intubation Date/Time: 04/15/2011 11:39 AM Performed by: Elon Alas Pre-anesthesia Checklist: Patient identified, Emergency Drugs available, Timeout performed, Patient being monitored and Suction available Patient Re-evaluated:Patient Re-evaluated prior to inductionOxygen Delivery Method: Circle System Utilized Preoxygenation: Pre-oxygenation with 100% oxygen Intubation Type: IV induction and Cricoid Pressure applied Ventilation: Mask ventilation without difficulty Laryngoscope Size: Mac and 4 Grade View: Grade III Tube type: Oral Tube size: 7.5 mm Number of attempts: 2 Airway Equipment and Method: stylet Placement Confirmation: ETT inserted through vocal cords under direct vision,  positive ETCO2 and breath sounds checked- equal and bilateral Secured at: 22 cm Tube secured with: Tape Dental Injury: Teeth and Oropharynx as per pre-operative assessment

## 2011-04-16 MED ORDER — BENZONATATE 100 MG PO CAPS
100.0000 mg | ORAL_CAPSULE | Freq: Three times a day (TID) | ORAL | Status: DC | PRN
  Administered 2011-04-16: 100 mg via ORAL
  Filled 2011-04-16 (×2): qty 1

## 2011-04-16 NOTE — Progress Notes (Signed)
Patient ID: Richard Reed, male   DOB: 24-Jun-1925, 77 y.o.   MRN: [id number] 1 Day Post-Op  Subjective: POD#1  Pt c/o of cough, minimal post op abd pain  Objective: Vital signs in last 24 hours: Temp:  [97.5 F (36.4 C)-99 F (37.2 C)] 98.3 F (36.8 C) (12/29 0922) Pulse Rate:  [74-92] 84  (12/29 0630) Resp:  [15-21] 18  (12/29 0630) BP: (103-160)/(53-84) 103/53 mmHg (12/29 0922) SpO2:  [93 %-98 %] 93 % (12/29 0922) Weight:  [168 lb (76.204 kg)] 168 lb (76.204 kg) (12/28 2300) Last BM Date: 04/14/11  Intake/Output this shift: Total I/O In: -  Out: 200 [Urine:200]  Physical Exam: BP 103/53  Pulse 84  Temp(Src) 98.3 F (36.8 C) (Oral)  Resp 18  Ht 5' 6.5" (1.689 m)  Wt 168 lb (76.204 kg)  BMI 26.71 kg/m2  SpO2 93% Abdomen soft, dressings dry  Labs: CBC  Basename 04/14/11 0530  WBC 3.7*  HGB 11.2*  HCT 33.9*  PLT 176   BMET  Basename 04/14/11 0530  NA 141  K 4.3  CL 105  CO2 29  GLUCOSE 88  BUN 18  CREATININE 1.38*  CALCIUM 8.7   LFT  Basename 04/14/11 0530  PROT 6.0  ALBUMIN 2.4*  AST 13  ALT 11  ALKPHOS 87  BILITOT 0.7  BILIDIR --  IBILI --  LIPASE 48   PT/INR No results found for this basename: LABPROT:2,INR:2 in the last 72 hours ABG No results found for this basename: PHART:2,PCO2:2,PO2:2,HCO3:2 in the last 72 hours  Studies/Results: Dg Cholangiogram Operative  04/15/2011  *RADIOLOGY REPORT*  Clinical Data:   Cholelithiasis with suspected cholecystitis.  INTRAOPERATIVE CHOLANGIOGRAM  Technique:  Cholangiographic images from the C-arm fluoroscopic device were submitted for interpretation post-operatively.  Please see the procedural report for the amount of contrast and the fluoroscopy time utilized.  Comparison:  04/13/2011  Findings:  The gallbladder has been removed and the cystic duct has been cannulated.  Contrast injection into the biliary tree demonstrates normal caliber common hepatic duct, and common bile duct.  There are no filling  defects observed.  There is no apparent obstruction, with good excretion into the duodenum.  IMPRESSION: Negative intraoperative cholangiogram.  Original Report Authenticated By: Elsie Stain, M.D.    Assessment: Principal Problem:  *Biliary colic Active Problems:  Atrial fibrillation  Prostate ca   Procedure(s): LAPAROSCOPIC CHOLECYSTECTOMY WITH INTRAOPERATIVE CHOLANGIOGRAM  Plan: Advance diet and activity as tolerated  LOS: 4 days    Richard Reed 04/16/2011

## 2011-04-16 NOTE — Progress Notes (Signed)
I interviewed and examined this patient and discussed the care plan with Dr. Ashley Royalty and the [facility] team and agree with assessment and plan as documented in the progress note for today.    Richard Reed A. Sheffield Slider, MD Family Medicine Teaching Service Attending  04/16/2011 5:19 PM

## 2011-04-16 NOTE — Progress Notes (Signed)
Daily Progress Note Everrett Coombe, DO Family Medicine PGY-2 Pager [phone]  Subjective: Patient lying in bed no complaints this am.  Mild abdominal pain over incision sites.  Has not passed any gas or had BM since surgery yesterday.  Per nursing has had issues with urinary retention.  Bladder scans with 600cc, post void residuals have been in the 300's, some improvement this morning.    Objective: Vital signs in last 24 hours: Temp:  [97.5 F (36.4 C)-99 F (37.2 C)] 97.5 F (36.4 C) (12/29 0630) Pulse Rate:  [74-92] 84  (12/29 0630) Resp:  [15-21] 18  (12/29 0630) BP: (107-160)/(54-84) 107/68 mmHg (12/29 0630) SpO2:  [93 %-98 %] 93 % (12/29 0630) Weight:  [168 lb (76.204 kg)] 168 lb (76.204 kg) (12/28 2300) Weight change:  Last BM Date: 04/14/11  Intake/Output from previous day: 12/28 0701 - 12/29 0700 In: 3725 [I.V.:3575; IV Piggyback:150] Out: 650 [Urine:600; Blood:50] Intake/Output this shift: Total I/O In: -  Out: 200 [Urine:200]  Physical Exam: Gen: alert, oriented x 4, NAD, pleasant, hard of hearing Cardiac: irregularly irregular Lungs: CTA-B Abd: non-distended, mild tenderness along abdomen, decreased BS, surgical sites clean/dry/intact Extremities: 1+ pitting edema of ankles    Lab Results:  Basename 04/14/11 0530  WBC 3.7*  HGB 11.2*  HCT 33.9*  PLT 176   BMET  Basename 04/14/11 0530  NA 141  K 4.3  CL 105  CO2 29  GLUCOSE 88  BUN 18  CREATININE 1.38*  CALCIUM 8.7   Urine gram stain - GNR in chains Urine Cx - No growth   Medications:  I have reviewed the patient's current medications. Scheduled:    . metoprolol succinate  12.5 mg Oral Daily  . pantoprazole  40 mg Oral Q0600  . piperacillin-tazobactam (ZOSYN)  IV  3.375 g Intravenous Q8H  . Tamsulosin HCl  0.8 mg Oral QPC breakfast   Continuous:    . dextrose 5 % and 0.45 % NaCl with KCl 20 mEq/L 100 mL/hr at 04/15/11 1815  . DISCONTD: sodium chloride 100 mL/hr at 04/14/11 2128  .  DISCONTD: lactated ringers 20 mL/hr at 04/15/11 1005   AVW:[id number], acetaminophen, HYDROcodone-acetaminophen, HYDROmorphone, HYDROmorphone, ondansetron (ZOFRAN) IV, ondansetron, DISCONTD: 0.9 % irrigation (POUR BTL), DISCONTD: bupivacaine-EPINEPHrine, DISCONTD: HYDROmorphone, DISCONTD: iohexol, DISCONTD: sodium chloride irrigation  Assessment/Plan: Mr. Whetsel is a very pleasant 77 year old gentleman who was recently discharged after treatment for urosepsis secondary to E. coli who presents with biliary colic.   1. Abdominal Pain - resolved, etiology was biliary colic w/ mild pancreatitis  - s/p laparoscopic cholecystecomy  2. Urinary Tract Infection - GNR  - switched to Zosyn for post-op coverage, previously with E. Coli sensitive to Keflex will restart this prior to d/c - continue Flomax for rentention, will try to avoid foley but if continues with retention may have to insert  3. Atrial Fibrillation - continue home metoprolol, rate controlled  4. FENGI - Soft solids  5. PPX - SCD's; protonix   6. Dispo -  Possibly home today if ok with surgery and urinary retention improves     LOS: 4 days   Aranda Bihm 04/16/2011, 8:43 AM

## 2011-04-17 LAB — CBC
Hemoglobin: 11.8 g/dL — ABNORMAL LOW (ref 13.0–17.0)
MCH: 31.6 pg (ref 26.0–34.0)
Platelets: 158 10*3/uL (ref 150–400)
RBC: 3.74 MIL/uL — ABNORMAL LOW (ref 4.22–5.81)
WBC: 5 10*3/uL (ref 4.0–10.5)

## 2011-04-17 MED ORDER — HYDROCODONE-ACETAMINOPHEN 5-325 MG PO TABS: 1.0000 | ORAL_TABLET | Freq: Four times a day (QID) | ORAL | Status: AC | PRN

## 2011-04-17 MED ORDER — ACETAMINOPHEN 325 MG PO TABS: 650.0000 mg | ORAL_TABLET | Freq: Four times a day (QID) | ORAL | Status: AC | PRN

## 2011-04-17 MED ORDER — TAMSULOSIN HCL 0.4 MG PO CAPS: 0.8000 mg | ORAL_CAPSULE | Freq: Every day | ORAL | Status: DC

## 2011-04-17 NOTE — Progress Notes (Signed)
Pt d/c to home with wife. All medications and f/u appointments reviewed all queeries clarified. Richard Reed. rn

## 2011-04-17 NOTE — Progress Notes (Signed)
Daily Progress Note Ellin Mayhew, MD Family Medicine PGY-3 Pager [phone]  Subjective: Patient lying in bed no complaints this am.  Mild abdominal pain over incision sites. + flatus but no bm this am.  Urinating well today.  Post void bladder scan showed no residual urine.    Objective: Vital signs in last 24 hours: Temp:  [97.6 F (36.4 C)-98 F (36.7 C)] 97.7 F (36.5 C) (12/30 0503) Pulse Rate:  [58-79] 79  (12/30 0503) Resp:  [12-20] 20  (12/30 0503) BP: (118-164)/(65-89) 153/89 mmHg (12/30 0503) SpO2:  [94 %-97 %] 96 % (12/30 0503) Weight change:  Last BM Date: 04/14/11  Intake/Output from previous day: 12/29 0701 - 12/30 0700 In: 2830 [P.O.:1080; I.V.:1650; IV Piggyback:100] Out: 1025 [Urine:1025] Intake/Output this shift:    Physical Exam: Gen: alert, oriented x 4, NAD, pleasant, hard of hearing Cardiac: irregularly irregular Lungs: CTA-B Abd: non-distended, mild tenderness along abdomen, decreased BS, surgical sites clean/dry/intact- no visible blood or drainage on dressings. Extremities: trace lower ext edema.    Lab Results: Lab Results  Component Value Date   WBC 5.0 04/17/2011   HGB 11.8* 04/17/2011   HCT 35.1* 04/17/2011   MCV 93.9 04/17/2011   PLT 158 04/17/2011   Lab Results  Component Value Date   NA 141 04/14/2011   K 4.3 04/14/2011   CL 105 04/14/2011   CO2 29 04/14/2011   Urine gram stain - GNR in chains Urine Cx - No growth   Medications:  I have reviewed the patient's current medications.  Assessment/Plan: Mr. Sanfilippo is a very pleasant 77 year old gentleman who was recently discharged after treatment for urosepsis secondary to E. coli who presents with biliary colic.   1. Abdominal Pain - resolved, etiology was biliary colic w/ mild pancreatitis  - s/p laparoscopic cholecystecomy  -pain well controlled.   -surgery agrees to d/c today  2. Urinary Tract Infection - GNR  -urine culture on 12/26 showed no growth, currently no  symptoms, covered with zosyn x 4days while in hospital.   -will d/c pt with no antibiotics at time of d/c since urine culture negative.  - retention now improved, will continue Flomax at time of d/c  3. Atrial Fibrillation - continue home metoprolol, rate controlled  4. FENGI - regular diet  5. PPX - SCD's; protonix   6. Dispo -  D/c home today.      LOS: 5 days   Janathan Bribiesca 04/17/2011, 10:05 AM

## 2011-04-17 NOTE — Progress Notes (Signed)
Patient ID: Richard Reed, male   DOB: May 23, 1925, 77 y.o.   MRN: [id number] 2 Days Post-Op  Subjective: POD @. Feels a bit bloated and has not had a BM> However, no nausea, tol diet and minimal pain  Objective: Vital signs in last 24 hours: Temp:  [97.6 F (36.4 C)-98 F (36.7 C)] 97.7 F (36.5 C) (12/30 0503) Pulse Rate:  [58-79] 79  (12/30 0503) Resp:  [12-20] 20  (12/30 0503) BP: (118-164)/(65-89) 153/89 mmHg (12/30 0503) SpO2:  [94 %-97 %] 96 % (12/30 0503) Last BM Date: 04/14/11  Intake/Output this shift:    Physical Exam: BP 153/89  Pulse 79  Temp(Src) 97.7 F (36.5 C) (Oral)  Resp 20  Ht 5' 6.5" (1.689 m)  Wt 168 lb (76.204 kg)  BMI 26.71 kg/m2  SpO2 96% Abdomen soft, dressings dry  Labs: CBC No results found for this basename: WBC:2,HGB:2,HCT:2,PLT:2 in the last 72 hours BMET No results found for this basename: NA:2,K:2,CL:2,CO2:2,GLUCOSE:2,BUN:2,CREATININE:2,CALCIUM:2 in the last 72 hours LFT No results found for this basename: PROT,ALBUMIN,AST,ALT,ALKPHOS,BILITOT,BILIDIR,IBILI,LIPASE in the last 72 hours PT/INR No results found for this basename: LABPROT:2,INR:2 in the last 72 hours ABG No results found for this basename: PHART:2,PCO2:2,PO2:2,HCO3:2 in the last 72 hours  Studies/Results: Dg Cholangiogram Operative  04/15/2011  *RADIOLOGY REPORT*  Clinical Data:   Cholelithiasis with suspected cholecystitis.  INTRAOPERATIVE CHOLANGIOGRAM  Technique:  Cholangiographic images from the C-arm fluoroscopic device were submitted for interpretation post-operatively.  Please see the procedural report for the amount of contrast and the fluoroscopy time utilized.  Comparison:  04/13/2011  Findings:  The gallbladder has been removed and the cystic duct has been cannulated.  Contrast injection into the biliary tree demonstrates normal caliber common hepatic duct, and common bile duct.  There are no filling defects observed.  There is no apparent obstruction, with good  excretion into the duodenum.  IMPRESSION: Negative intraoperative cholangiogram.  Original Report Authenticated By: Elsie Stain, M.D.    Assessment: Principal Problem:  *Biliary colic Active Problems:  Atrial fibrillation  Prostate ca   Procedure(s): LAPAROSCOPIC CHOLECYSTECTOMY WITH INTRAOPERATIVE CHOLANGIOGRAM Progressing as expected from surgery  Plan: Believe he can be discharged from our standpoint  LOS: 5 days    Laikyn Gewirtz J 04/17/2011

## 2011-04-17 NOTE — Discharge Summary (Signed)
Physician Discharge Summary  Patient ID: Richard Reed MRN: [id number] DOB/AGE: [birth year] 77 y.o.  Admit date: 04/12/2011 Discharge date: 04/17/2011  Admission Diagnoses: Abdominal pain  Discharge Diagnoses:  Principal Problem:  *Biliary colic Active Problems:  Atrial fibrillation  Prostate ca   Discharged Condition: good  Hospital Course:  Richard Reed is a very pleasant 77 year old gentleman who was recently discharged after treatment for urosepsis secondary to E. coli who presents with biliary colic.   1. Abdominal Pain - resolved, etiology was biliary colic w/ mild pancreatitis  - s/p laparoscopic cholecystecomy this admission performed by General surgery -pain well controlled.  -surgery agrees to d/c today   2. Urinary Tract Infection - GNR  -urine culture on 12/26 showed no growth, currently no symptoms, covered with zosyn x 4days while in hospital.  -will d/c pt with no antibiotics at time of d/c since urine culture negative.  - retention now improved, will continue Flomax at time of d/c   3. Atrial Fibrillation - continue home metoprolol, rate controlled   Consults: general surgery  Significant Diagnostic Studies:  04/15/2011 *RADIOLOGY REPORT* Clinical Data: Cholelithiasis with suspected cholecystitis. INTRAOPERATIVE CHOLANGIOGRAM Technique: Cholangiographic images from the C-arm fluoroscopic device were submitted for interpretation post-operatively. Please see the procedural report for the amount of contrast and the fluoroscopy time utilized. Comparison: 04/13/2011 Findings: The gallbladder has been removed and the cystic duct has been cannulated. Contrast injection into the biliary tree demonstrates normal caliber common hepatic duct, and common bile duct. There are no filling defects observed. There is no apparent obstruction, with good excretion into the duodenum. IMPRESSION: Negative intraoperative cholangiogram. Original Report Authenticated By: Elsie Stain,  M.D.    Treatments: surgery: cholecystectomy- laparoscopic  Discharge Exam: Blood pressure 158/76, pulse 49, temperature 98 F (36.7 C), temperature source Oral, resp. rate 18, height 5' 6.5" (1.689 m), weight 168 lb (76.204 kg), SpO2 96.00%. See day of discharge progress note.   Disposition: Home or Self Care  Discharge Orders    Future Orders Please Complete By Expires   Diet general      Increase activity slowly      Call MD for:  temperature >100.4      Call MD for:  persistant nausea and vomiting      Call MD for:  severe uncontrolled pain      Call MD for:  redness, tenderness, or signs of infection (pain, swelling, redness, odor or green/yellow discharge around incision site)      Call MD for:  difficulty breathing, headache or visual disturbances      Call MD for:  hives        Medication List  As of 04/17/2011  3:42 PM   START taking these medications         acetaminophen 325 MG tablet   Commonly known as: TYLENOL   Take 2 tablets (650 mg total) by mouth every 6 (six) hours as needed (or Fever >/= 101).      HYDROcodone-acetaminophen 5-325 MG per tablet   Commonly known as: NORCO   Take 1-2 tablets by mouth every 6 (six) hours as needed.         CONTINUE taking these medications         metoprolol succinate 12.5 mg Tb24   Commonly known as: TOPROL-XL   Take 0.5 tablets (12.5 mg total) by mouth daily.      Tamsulosin HCl 0.4 MG Caps   Commonly known as: FLOMAX   Take  2 capsules (0.8 mg total) by mouth daily after breakfast.          Where to get your medications    These are the prescriptions that you need to pick up. We sent them to a specific pharmacy, so you will need to go there to get them.   [id number] - [place] ([place]), [place] - [address]    [address] [place] ([place]) [place] [address]    Phone: [phone]        acetaminophen 325 MG tablet   Tamsulosin HCl 0.4 MG Caps         You may get these medications from any  pharmacy.         HYDROcodone-acetaminophen 5-325 MG per tablet           Follow-up Information    Follow up with Richard Rene Kocher, MD. Make an appointment in 3 weeks. (DR. Wyatt or [facility] clinic.  Call if you have a problem sooner.)    Contact information:   [facility] [address] [place] [address] [phone]       Follow up with Dr. Cleta Reed. (hospital follow up in 1 week- call to schedule an appointment)          Signed: Juandavid Reed 04/17/2011, 3:42 PM

## 2011-04-17 NOTE — Progress Notes (Signed)
I interviewed and examined this patient and discussed the care plan with Dr. Edmonia Kemoni and the [facility] team and agree with assessment and plan as documented in the progress note for today.    Filomena Pokorney A. Sheffield Slider, MD Family Medicine Teaching Service Attending  04/17/2011 5:46 PM

## 2011-04-17 NOTE — Discharge Summary (Signed)
I interviewed and examined this patient and discussed the care plan with Dr.Caviness and the [facility] team and agree with assessment and plan as documented in the discharge note for today.    Delayne Sanzo A. Sheffield Slider, MD Family Medicine Teaching Service Attending  04/17/2011 8:46 PM

## 2011-04-20 ENCOUNTER — Encounter (HOSPITAL_COMMUNITY): Payer: Self-pay | Admitting: General Surgery

## 2011-04-20 LAB — FUNGUS CULTURE W SMEAR

## 2011-04-21 ENCOUNTER — Ambulatory Visit (INDEPENDENT_AMBULATORY_CARE_PROVIDER_SITE_OTHER): Payer: Medicare Other

## 2011-04-21 DIAGNOSIS — A419 Sepsis, unspecified organism: Secondary | ICD-10-CM

## 2011-04-21 DIAGNOSIS — R209 Unspecified disturbances of skin sensation: Secondary | ICD-10-CM

## 2011-05-09 ENCOUNTER — Encounter (INDEPENDENT_AMBULATORY_CARE_PROVIDER_SITE_OTHER): Payer: Self-pay | Admitting: General Surgery

## 2011-05-09 ENCOUNTER — Ambulatory Visit (INDEPENDENT_AMBULATORY_CARE_PROVIDER_SITE_OTHER): Payer: Medicare Other | Admitting: General Surgery

## 2011-05-09 VITALS — BP 130/66 | HR 54 | Temp 97.8°F | Ht 66.5 in | Wt 149.2 lb

## 2011-05-09 DIAGNOSIS — Z09 Encounter for follow-up examination after completed treatment for conditions other than malignant neoplasm: Secondary | ICD-10-CM

## 2011-05-09 NOTE — Progress Notes (Signed)
HPI With the exception of some urinary incontinence which is not related to his gallbladder surgery the patient is doing well.  PE On examination his incisions look well with no evidence of infection. I did remove the plastic dressings on all his incisions and they looked well healed.  Studiy review None.  Assessment During status post laparoscopic cholecystectomy with no apparent postoperative complications.  Plan Return to see me on a p.r.n. basis.

## 2011-07-26 ENCOUNTER — Encounter: Payer: Self-pay | Admitting: Emergency Medicine

## 2011-11-09 ENCOUNTER — Telehealth: Payer: Self-pay

## 2011-11-09 NOTE — Telephone Encounter (Signed)
The patient's wife called to ask if Dr. Cleta Alberts needs to see Adrion every six months or on an as needed basis.  Please call the patient back at [phone] or on wife's cell phone at [phone].

## 2011-11-09 NOTE — Telephone Encounter (Signed)
Left message of info 

## 2011-11-09 NOTE — Telephone Encounter (Signed)
I need to see him every 6 month

## 2012-02-09 ENCOUNTER — Encounter: Payer: Self-pay | Admitting: Family Medicine

## 2012-02-09 ENCOUNTER — Ambulatory Visit (INDEPENDENT_AMBULATORY_CARE_PROVIDER_SITE_OTHER): Payer: Medicare Other | Admitting: Family Medicine

## 2012-02-09 VITALS — Temp 97.4°F

## 2012-02-09 DIAGNOSIS — Z23 Encounter for immunization: Secondary | ICD-10-CM

## 2012-02-09 NOTE — Addendum Note (Signed)
Addended by: Sol Blazing E on: 02/09/2012 10:14 AM   Modules accepted: Level of Service

## 2012-03-12 ENCOUNTER — Ambulatory Visit (INDEPENDENT_AMBULATORY_CARE_PROVIDER_SITE_OTHER): Payer: Medicare Other | Admitting: Emergency Medicine

## 2012-03-12 VITALS — BP 124/64 | HR 63 | Temp 97.4°F | Resp 16 | Ht 67.5 in | Wt 160.0 lb

## 2012-03-12 DIAGNOSIS — R638 Other symptoms and signs concerning food and fluid intake: Secondary | ICD-10-CM

## 2012-03-12 DIAGNOSIS — L608 Other nail disorders: Secondary | ICD-10-CM

## 2012-03-12 NOTE — Progress Notes (Signed)
  Subjective:    Patient ID: Richard Reed, male    DOB: 1925-10-18, 78 y.o.   MRN: [id number]  HPI patient enters after being this seen by nurse practitioner a home visit. It is recommended that he return to clinic today to get his flu shot and get a referral to a podiatrist. He a complicated history last year of needing to have his gallbladder removed after being seen with acute respiratory failure he's done well since that time. His cardiologist is Dr. Allyson Sabal.    Review of Systems     Objective:   Physical Exam patient is alert and cooperative but very hard of hearing. His chest is clear extremity exam reveals dystrophic nails pulses are only trace he does not have any evidence of ischemia to the tips of the toes         Assessment & Plan:  Labor and make referral to podiatrist. He was given a flu shot today. We'll check on his lipid panel done by Dr.Berry.

## 2012-05-01 ENCOUNTER — Other Ambulatory Visit (HOSPITAL_COMMUNITY): Payer: Self-pay | Admitting: Cardiovascular Disease

## 2012-05-01 DIAGNOSIS — I351 Nonrheumatic aortic (valve) insufficiency: Secondary | ICD-10-CM

## 2012-07-17 ENCOUNTER — Encounter: Payer: Self-pay | Admitting: Emergency Medicine

## 2012-07-17 ENCOUNTER — Ambulatory Visit (INDEPENDENT_AMBULATORY_CARE_PROVIDER_SITE_OTHER): Payer: Medicare Other | Admitting: Emergency Medicine

## 2012-07-17 ENCOUNTER — Ambulatory Visit: Payer: Medicare Other

## 2012-07-17 VITALS — BP 110/70 | HR 70 | Temp 97.6°F | Resp 16 | Ht 68.0 in | Wt 161.2 lb

## 2012-07-17 DIAGNOSIS — R194 Change in bowel habit: Secondary | ICD-10-CM

## 2012-07-17 DIAGNOSIS — Z0000 Encounter for general adult medical examination without abnormal findings: Secondary | ICD-10-CM

## 2012-07-17 DIAGNOSIS — R195 Other fecal abnormalities: Secondary | ICD-10-CM

## 2012-07-17 DIAGNOSIS — R198 Other specified symptoms and signs involving the digestive system and abdomen: Secondary | ICD-10-CM

## 2012-07-17 LAB — POCT URINALYSIS DIPSTICK
Bilirubin, UA: NEGATIVE
Glucose, UA: NEGATIVE
Ketones, UA: NEGATIVE
Spec Grav, UA: 1.02

## 2012-07-17 LAB — POCT UA - MICROSCOPIC ONLY

## 2012-07-17 LAB — CBC WITH DIFFERENTIAL/PLATELET
Eosinophils Absolute: 0.1 10*3/uL (ref 0.0–0.7)
Eosinophils Relative: 2 % (ref 0–5)
Hemoglobin: 14.1 g/dL (ref 13.0–17.0)
Lymphocytes Relative: 33 % (ref 12–46)
Lymphs Abs: 1.3 10*3/uL (ref 0.7–4.0)
MCH: 28.8 pg (ref 26.0–34.0)
MCV: 86.5 fL (ref 78.0–100.0)
Monocytes Relative: 8 % (ref 3–12)
Platelets: 155 10*3/uL (ref 150–400)
RBC: 4.9 MIL/uL (ref 4.22–5.81)
WBC: 3.9 10*3/uL — ABNORMAL LOW (ref 4.0–10.5)

## 2012-07-17 LAB — IFOBT (OCCULT BLOOD): IFOBT: POSITIVE

## 2012-07-17 LAB — COMPREHENSIVE METABOLIC PANEL
ALT: 11 U/L (ref 0–53)
Alkaline Phosphatase: 73 U/L (ref 39–117)
Sodium: 144 mEq/L (ref 135–145)
Total Bilirubin: 1.1 mg/dL (ref 0.3–1.2)
Total Protein: 6.1 g/dL (ref 6.0–8.3)

## 2012-07-17 LAB — LIPID PANEL
LDL Cholesterol: 119 mg/dL — ABNORMAL HIGH (ref 0–99)
Triglycerides: 100 mg/dL (ref <150)
VLDL: 20 mg/dL (ref 0–40)

## 2012-07-17 NOTE — Progress Notes (Signed)
  Subjective:    Patient ID: Richard Reed, male    DOB: 11-19-1925, 79 y.o.   MRN: [id number]  HPI    Review of Systems  Constitutional: Negative.   HENT: Positive for hearing loss and dental problem.   Eyes: Negative.   Respiratory: Negative.   Cardiovascular: Negative.   Gastrointestinal: Positive for diarrhea.  Endocrine: Negative.   Genitourinary: Negative.   Musculoskeletal: Negative.   Skin: Negative.   Allergic/Immunologic: Negative.   Neurological: Negative.   Hematological: Negative.   Psychiatric/Behavioral: Negative.        Objective:   Physical Exam pupils are equal and reactive to light. He has bilateral hearing aids in. Is is norma throat shows normal posterior pharynx has moderate gingival disease. No carotid bruits. Chest is clear to auscultation and percussion. Cardiac exam reveals a 2/6 systolic and a 2/6 diastolic heard at the left sternal border and towards the apex of the heart. Abdomen is soft there are well-healed incisions. Rectal exam reveals enlarged prostate but no rectal masses feltl Extremities are without cyanosis clubbing or edema  [facility] reading (PRIMARY) by  Dr.Dora Clauss there is a 1 cm calcific density in the area of the left kidney  Results for orders placed in visit on 07/17/12  IFOBT (OCCULT BLOOD)      Result Value Range   IFOBT Positive    POCT UA - MICROSCOPIC ONLY      Result Value Range   WBC, Ur, HPF, POC 0-6     RBC, urine, microscopic 2-12     Bacteria, U Microscopic trace     Mucus, UA moderate     Epithelial cells, urine per micros 0-2     Crystals, Ur, HPF, POC neg     Casts, Ur, LPF, POC neg     Yeast, UA neg    POCT URINALYSIS DIPSTICK      Result Value Range   Color, UA yellow     Clarity, UA clear     Glucose, UA neg     Bilirubin, UA neg     Ketones, UA neg     Spec Grav, UA 1.020     Blood, UA trace     pH, UA 5.5     Protein, UA neg     Urobilinogen, UA 0.2     Nitrite, UA neg     Leukocytes, UA Trace          Assessment & Plan     I am concerned about his change in bowel habits. Referral has been made to Dr. Elnoria Howard. KUB will be done along with routine labs. I have given him a prescription for his shingles vaccine. He does have hematuria but recently saw the urologist and is on antibiotics for an infection. He also has a known kidney stone in the left kidney. He is status post implants for prostate cancer

## 2012-07-18 ENCOUNTER — Encounter: Payer: Self-pay | Admitting: Radiology

## 2012-08-29 ENCOUNTER — Telehealth: Payer: Self-pay | Admitting: Cardiovascular Disease

## 2012-08-29 NOTE — Telephone Encounter (Signed)
Patient should be on 81 mg of aspirin please call

## 2012-08-29 NOTE — Telephone Encounter (Signed)
Returned call.  Spoke w/ Kathie Rhodes, pt's wife.  Stated the nurse was concerned yesterday that the pt is taking a full ASA.  Informed wife that pt should be taking an 81mg  ASA.  Wife verbalized understanding and agreed to switch to the correct dose.

## 2012-08-29 NOTE — Telephone Encounter (Signed)
Medication list states pt is taking ASA 325mg  on 1.8.14 and dictation from same visit states ASA 81mg .  Will notify Dr. Rockie Neighbours, RN for further instructions and then return call.

## 2012-08-29 NOTE — Telephone Encounter (Signed)
Please call-Home health nurse wanted to know why he was taking a whole aspirin a day?

## 2012-10-29 ENCOUNTER — Ambulatory Visit (INDEPENDENT_AMBULATORY_CARE_PROVIDER_SITE_OTHER): Payer: Self-pay | Admitting: Cardiology

## 2012-10-29 ENCOUNTER — Encounter: Payer: Self-pay | Admitting: Cardiology

## 2012-10-29 VITALS — BP 120/68 | HR 70 | Ht 69.0 in | Wt 162.5 lb

## 2012-10-29 DIAGNOSIS — I351 Nonrheumatic aortic (valve) insufficiency: Secondary | ICD-10-CM

## 2012-10-29 DIAGNOSIS — I4891 Unspecified atrial fibrillation: Secondary | ICD-10-CM

## 2012-10-29 DIAGNOSIS — I480 Paroxysmal atrial fibrillation: Secondary | ICD-10-CM | POA: Insufficient documentation

## 2012-10-29 DIAGNOSIS — I359 Nonrheumatic aortic valve disorder, unspecified: Secondary | ICD-10-CM

## 2012-10-29 DIAGNOSIS — N183 Chronic kidney disease, stage 3 unspecified: Secondary | ICD-10-CM

## 2012-10-29 HISTORY — DX: Paroxysmal atrial fibrillation: I48.0

## 2012-10-29 HISTORY — DX: Nonrheumatic aortic (valve) insufficiency: I35.1

## 2012-10-29 NOTE — Assessment & Plan Note (Signed)
occ episode of rapid HR, but very brief.  PACs on EKG.

## 2012-10-29 NOTE — Patient Instructions (Addendum)
Keep appointment for the Echo 11/26/12 we will call you the results.  Follow up with Dr. Allyson Sabal in 6 months.  Call if any chest pain, shortness of breath or any passing out episodes.

## 2012-10-29 NOTE — Assessment & Plan Note (Signed)
No chest pain or SOB, no dizziness or syncope.  Scheduled for echo  11/26/12

## 2012-10-29 NOTE — Progress Notes (Signed)
10/29/2012   PCP: Lucilla Edin, MD   Chief Complaint  Patient presents with  . 6 month visit    no chest pain, no sob,no swelling, sometimes knee cap swells    Primary Cardiologist: Dr. Allyson Sabal  HPI: 79 year old white married male followed by Dr. Allyson Sabal for history of PAF during an episode of septic shock with Escherichia coli bacteremia.  EF at that time was 45-50% and he was noted to have moderate aortic insufficiency. Other history includes hypertension and hyperlipidemia. His cholesterol is followed by Dr. Cleta Alberts. His cardiac cath was in June of 2012 with 40-60% ostial right coronary artery stenosis without dampening. Right heart cath at that time was unremarkable.  Patient has a history of recent diarrhea followed by Dr. Elnoria Howard was placed on medications and that has resolved.  Today he has no complaints no chest pain or shortness of breath. No history of syncope dizziness or near syncope. His diet is healthy they have a fish he is on fish oil daily only 1 g daily.  He is scheduled for his 2-D echo for his aortic insufficiency.  No Known Allergies  Current Outpatient Prescriptions  Medication Sig Dispense Refill  . aspirin EC 81 MG tablet Take 81 mg by mouth daily.      . fish oil-omega-3 fatty acids 1000 MG capsule Take 1 g by mouth daily.       . metoprolol succinate (TOPROL-XL) 25 MG 24 hr tablet Take 1/2tablet by mouth twice a day      . Multiple Vitamin (MULTIVITAMIN) tablet Take 1 tablet by mouth daily.      Marland Kitchen OVER THE COUNTER MEDICATION OTC Calcium chewable      . tamsulosin (FLOMAX) 0.4 MG CAPS Take 0.4 mg by mouth 2 (two) times daily.       No current facility-administered medications for this visit.    Past Medical History  Diagnosis Date  . Irregular heart rate   . Kidney stones   . Hard of hearing   . CKD (chronic kidney disease)   . HTN (hypertension)   . Hiatal hernia   . Sepsis due to Escherichia coli 03/24/11  . Dysrhythmia   . Angina   . Pneumonia  03/23/2011    "first time"  . Cancer   . Prostate cancer   . Arthritis pain   . Arthritis   . PAF (paroxysmal atrial fibrillation) 10/29/2012  . Aortic valve insufficiency, moderate 10/29/2012  . CAD (coronary artery disease)     non obstructive    Past Surgical History  Procedure Laterality Date  . Cholecystectomy  04/15/2011    Procedure: LAPAROSCOPIC CHOLECYSTECTOMY WITH INTRAOPERATIVE CHOLANGIOGRAM;  Surgeon: Jetty Duhamel, MD;  Location: [facility] OR;  Service: General;  Laterality: N/A;  . Prostate surgery      approx 10 years ago, seed implant radiation  . Cardiac catheterization  09/2010    non obstructive CAD, 40-60% ostial RCA stenosis without dampening     EAV:WUJWJXB:JY colds or fevers, no weight changes Skin:no rashes or ulcers HEENT:no blurred vision, no congestion CV:see HPI PUL:see HPI NW:GNFAOZ diarrhea followed by GI, no constipation or melena, no indigestion GU:no hematuria, no dysuria MS:no joint pain, no claudication Neuro:no syncope, no lightheadedness Endo:no diabetes, no thyroid disease  PHYSICAL EXAM BP 120/68  Pulse 70  Ht 5\' 9"  (1.753 m)  Wt 162 lb 8 oz (73.71 kg)  BMI 23.99 kg/m2 General:Pleasant affect, NAD Skin:Warm and dry, brisk capillary refill  HEENT:normocephalic, sclera clear, mucus membranes moist Neck:supple, no JVD, no bruits  Heart:S1S2 RRR witht murmur, gallup, rub or click Lungs:clear without rales, rhonchi, or wheezes UJW:JXBJ, non tender, + BS, do not palpate liver spleen or masses Ext:no lower ext edema, 2+ pedal pulses, 2+ radial pulses Neuro:alert and oriented, MAE, follows commands, + facial symmetry  EKG: SR with PACs no acute changes from previous tracings he also had PACs on the previous tracing  ASSESSMENT AND PLAN Aortic valve insufficiency, moderate No chest pain or SOB, no dizziness or syncope.  Scheduled for echo  11/26/12  PAF (paroxysmal atrial fibrillation) occ episode of rapid HR, but very brief.  PACs on EKG.     Chronic renal insufficiency, stage III (moderate) stable   On next lipid check it may be prudent to add a statin to help reduce his LDL.  I will not now with recent diarrhea episode.  He'll follow with Dr. Allyson Sabal in 6 months unless he has an abnormal echo.

## 2012-10-29 NOTE — Assessment & Plan Note (Signed)
stable °

## 2012-11-26 ENCOUNTER — Ambulatory Visit (HOSPITAL_COMMUNITY)
Admission: RE | Admit: 2012-11-26 | Discharge: 2012-11-26 | Disposition: A | Discharge status: Home / Self Care | Payer: Medicare Other | Source: Ambulatory Visit | Attending: Cardiovascular Disease | Admitting: Cardiovascular Disease

## 2012-11-26 DIAGNOSIS — I379 Nonrheumatic pulmonary valve disorder, unspecified: Secondary | ICD-10-CM | POA: Insufficient documentation

## 2012-11-26 DIAGNOSIS — I059 Rheumatic mitral valve disease, unspecified: Secondary | ICD-10-CM | POA: Insufficient documentation

## 2012-11-26 DIAGNOSIS — I4891 Unspecified atrial fibrillation: Secondary | ICD-10-CM | POA: Insufficient documentation

## 2012-11-26 DIAGNOSIS — I359 Nonrheumatic aortic valve disorder, unspecified: Secondary | ICD-10-CM | POA: Insufficient documentation

## 2012-11-26 DIAGNOSIS — I351 Nonrheumatic aortic (valve) insufficiency: Secondary | ICD-10-CM

## 2012-11-26 DIAGNOSIS — I517 Cardiomegaly: Secondary | ICD-10-CM | POA: Insufficient documentation

## 2012-11-26 DIAGNOSIS — I079 Rheumatic tricuspid valve disease, unspecified: Secondary | ICD-10-CM | POA: Insufficient documentation

## 2012-11-26 DIAGNOSIS — I10 Essential (primary) hypertension: Secondary | ICD-10-CM | POA: Insufficient documentation

## 2012-11-26 DIAGNOSIS — I2510 Atherosclerotic heart disease of native coronary artery without angina pectoris: Secondary | ICD-10-CM | POA: Insufficient documentation

## 2012-11-26 NOTE — Progress Notes (Signed)
2D Echo Performed 11/26/2012    Syleena Mchan, RCS  

## 2012-12-05 ENCOUNTER — Telehealth: Payer: Self-pay | Admitting: *Deleted

## 2012-12-05 ENCOUNTER — Encounter: Payer: Self-pay | Admitting: *Deleted

## 2012-12-05 DIAGNOSIS — I359 Nonrheumatic aortic valve disorder, unspecified: Secondary | ICD-10-CM

## 2012-12-05 NOTE — Telephone Encounter (Signed)
Message copied by Marella Bile on Wed Dec 05, 2012 12:42 PM ------      Message from: Runell Gess      Created: Mon Dec 03, 2012  9:01 PM       No change from prior study. Repeat in 12 months. ------

## 2013-02-12 ENCOUNTER — Encounter: Payer: Self-pay | Admitting: Emergency Medicine

## 2013-02-12 ENCOUNTER — Ambulatory Visit (INDEPENDENT_AMBULATORY_CARE_PROVIDER_SITE_OTHER): Payer: Medicare Other | Admitting: Emergency Medicine

## 2013-02-12 VITALS — BP 120/72 | HR 74 | Temp 97.6°F | Resp 16 | Ht 68.0 in | Wt 162.6 lb

## 2013-02-12 DIAGNOSIS — Z23 Encounter for immunization: Secondary | ICD-10-CM

## 2013-02-12 DIAGNOSIS — R109 Unspecified abdominal pain: Secondary | ICD-10-CM

## 2013-02-12 DIAGNOSIS — N189 Chronic kidney disease, unspecified: Secondary | ICD-10-CM

## 2013-02-12 LAB — CBC WITH DIFFERENTIAL/PLATELET
Eosinophils Absolute: 0.1 10*3/uL (ref 0.0–0.7)
Eosinophils Relative: 2 % (ref 0–5)
Hemoglobin: 14.2 g/dL (ref 13.0–17.0)
Lymphocytes Relative: 35 % (ref 12–46)
Lymphs Abs: 1.3 10*3/uL (ref 0.7–4.0)
MCH: 28.8 pg (ref 26.0–34.0)
MCV: 84.4 fL (ref 78.0–100.0)
Monocytes Relative: 9 % (ref 3–12)
Neutrophils Relative %: 54 % (ref 43–77)
Platelets: 154 10*3/uL (ref 150–400)
RBC: 4.93 MIL/uL (ref 4.22–5.81)
WBC: 3.8 10*3/uL — ABNORMAL LOW (ref 4.0–10.5)

## 2013-02-12 LAB — COMPREHENSIVE METABOLIC PANEL
ALT: 11 U/L (ref 0–53)
Albumin: 4.3 g/dL (ref 3.5–5.2)
CO2: 27 mEq/L (ref 19–32)
Calcium: 9.8 mg/dL (ref 8.4–10.5)
Chloride: 105 mEq/L (ref 96–112)
Glucose, Bld: 89 mg/dL (ref 70–99)
Sodium: 141 mEq/L (ref 135–145)
Total Bilirubin: 0.7 mg/dL (ref 0.3–1.2)
Total Protein: 6.9 g/dL (ref 6.0–8.3)

## 2013-02-12 LAB — LIPID PANEL
Cholesterol: 185 mg/dL (ref 0–200)
Triglycerides: 84 mg/dL (ref <150)

## 2013-02-12 NOTE — Progress Notes (Signed)
  Subjective:    Patient ID: Richard Reed, male    DOB: 21-Mar-1926, 79 y.o.   MRN: [id number]  HPI when patient was last seen here he was having the abdominal discomfort with a heme positive stool. He subsequently saw Dr. Audley Hose and decision made not to undergo a colonoscopy. Since that time he is markedly improved. He no longer has abdominal pain and feels fine. He has had no chest pain or shortness of breath. He does [facility] every 6 months for checkup. He is feeling great eating well no specific issues today    Review of Systems     Objective:   Physical Exam patient is alert and cooperative. His neck is supple there are no carotid bruits his chest was clear. His cardiac exam revealed a 2/6 diastolic murmur at the left sternal border abdomen is soft nontender extremities are without edema        Assessment & Plan:  Patient looks great. He is only on a baby aspirin metoprolol tartrate Flomax. We'll recheck 6 months. He was given his flu shot today

## 2013-02-13 ENCOUNTER — Telehealth: Payer: Self-pay | Admitting: Emergency Medicine

## 2013-02-13 ENCOUNTER — Telehealth: Payer: Self-pay | Admitting: Family Medicine

## 2013-02-13 NOTE — Telephone Encounter (Signed)
Called for GI results to be faxed over.

## 2013-02-13 NOTE — Telephone Encounter (Signed)
Labs are stable no change in treatment at the present time

## 2013-02-13 NOTE — Telephone Encounter (Signed)
LMOM to Call back about his labs

## 2013-02-15 NOTE — Telephone Encounter (Signed)
Pt's wife notified.

## 2013-04-30 ENCOUNTER — Ambulatory Visit (INDEPENDENT_AMBULATORY_CARE_PROVIDER_SITE_OTHER): Payer: Medicare Other | Admitting: Cardiovascular Disease

## 2013-04-30 ENCOUNTER — Encounter: Payer: Self-pay | Admitting: Cardiovascular Disease

## 2013-04-30 VITALS — BP 122/70 | HR 92 | Ht 68.0 in | Wt 162.7 lb

## 2013-04-30 DIAGNOSIS — I10 Essential (primary) hypertension: Secondary | ICD-10-CM

## 2013-04-30 DIAGNOSIS — E785 Hyperlipidemia, unspecified: Secondary | ICD-10-CM

## 2013-04-30 DIAGNOSIS — I351 Nonrheumatic aortic (valve) insufficiency: Secondary | ICD-10-CM

## 2013-04-30 DIAGNOSIS — I359 Nonrheumatic aortic valve disorder, unspecified: Secondary | ICD-10-CM

## 2013-04-30 NOTE — Assessment & Plan Note (Signed)
The patient has chronic moderate aortic insufficiency. He had a 2-D echo performed this past August that showed normal LV systolic function with mild LV dilatation essentially unchanged from prior echoes. He remains asymptomatic. Continue to follow his 2-D echo on an annual basis.

## 2013-04-30 NOTE — Assessment & Plan Note (Signed)
Currently not on statin therapy. He has a history of normal coronary arteries by cardiac catheterization several years ago. This was recent lipid profile performed 02/04/13 revealed a total cholesterol of 185, LDL 124 HDL of 44

## 2013-04-30 NOTE — Assessment & Plan Note (Signed)
Maintaining sinus rhythm

## 2013-04-30 NOTE — Assessment & Plan Note (Addendum)
Controlled on current medications 

## 2013-04-30 NOTE — Progress Notes (Signed)
04/30/2013 [name]   03-28-1926  [id number]  Primary Physician DAUB, Lina Sayre, MD Primary Cardiologist: Lorretta Harp MD Renae Gloss   HPI:  The patient is a very pleasant 80 year old thin-appearing married Caucasian male, father of 74, grandfather to 65 grandchildren, who I last saw 6 months ago. He was hospitalized late 2012 with E. coli bacteremia and septic shock. He did develop some arrhythmias with PAF during that hospitalization. His EF was 45% to 50% with moderate aortic insufficiency. His other problems include hypertension and hyperlipidemia. I catheterized him in June 2012, revealing normal coronary arteries, with 40% to 60% ostial right coronary artery stenosis without damping. His right heart catheterization was unremarkable. He has had an uncomplicated laparoscopic cholecystectomy performed by Dr. Chucky May, April 12, 2011. His most recent lab work performed 02/12/13 revealed a glucose of 185, LDL of 124 and HDL 44. A 2-D echocardiogram performed in August of last year revealed normal LV systolic function, mild LV dilatation with moderate aortic insufficiency unchanged from prior echoes. He remains asymptomatic. *   Current Outpatient Prescriptions  Medication Sig Dispense Refill  . aspirin EC 81 MG tablet Take 81 mg by mouth daily.      . cholestyramine (QUESTRAN) 4 G packet Take 4 g by mouth as needed.      . fish oil-omega-3 fatty acids 1000 MG capsule Take 1 g by mouth daily.       . metoprolol succinate (TOPROL-XL) 25 MG 24 hr tablet Take 1/2tablet by mouth twice a day      . Multiple Vitamin (MULTIVITAMIN) tablet Take 1 tablet by mouth daily.      Marland Kitchen OVER THE COUNTER MEDICATION OTC Calcium chewable      . tamsulosin (FLOMAX) 0.4 MG CAPS Take 0.4 mg by mouth 2 (two) times daily.       No current facility-administered medications for this visit.    No Known Allergies  History   Social History  . Marital Status: Married    Spouse Name: N/A   Number of Children: N/A  . Years of Education: N/A   Occupational History  . Retired    Social History Main Topics  . Smoking status: Never Smoker   . Smokeless tobacco: Never Used  . Alcohol Use: No  . Drug Use: No  . Sexual Activity: Yes   Other Topics Concern  . Not on file   Social History Narrative   Medical Decision Maker:  RAMAN FEATHERSTON (wife) Cell. [phone] House: [phone].   Daughter: Nigel Sloop: Cell [phone]   Full Code     Review of Systems: General: negative for chills, fever, night sweats or weight changes.  Cardiovascular: negative for chest pain, dyspnea on exertion, edema, orthopnea, palpitations, paroxysmal nocturnal dyspnea or shortness of breath Dermatological: negative for rash Respiratory: negative for cough or wheezing Urologic: negative for hematuria Abdominal: negative for nausea, vomiting, diarrhea, bright red blood per rectum, melena, or hematemesis Neurologic: negative for visual changes, syncope, or dizziness All other systems reviewed and are otherwise negative except as noted above.    Blood pressure 122/70, pulse 92, height 5\' 8"  (1.727 m), weight 162 lb 11.2 oz (73.8 kg).  General appearance: alert and no distress Neck: no adenopathy, no carotid bruit, no JVD, supple, symmetrical, trachea midline and thyroid not enlarged, symmetric, no tenderness/mass/nodules Lungs: clear to auscultation bilaterally Heart: 2/6 decrescendo diastolic murmur heard best at the left sternal border  consistent with moderate aortic insufficiency Extremities: extremities  normal, atraumatic, no cyanosis or edema  EKG normal sinus rhythm at 92 with incomplete right bundle-branch block versus RSR prime in lead V1  ASSESSMENT AND PLAN:   Aortic valve insufficiency, moderate The patient has chronic moderate aortic insufficiency. He had a 2-D echo performed this past August that showed normal LV systolic function with mild LV dilatation essentially  unchanged from prior echoes. He remains asymptomatic. Continue to follow his 2-D echo on an annual basis.  PAF (paroxysmal atrial fibrillation) Maintaining sinus rhythm  Essential hypertension Controlled on current medications  Hyperlipidemia Currently not on statin therapy. He has a history of normal coronary arteries by cardiac catheterization several years ago. This was recent lipid profile performed 02/04/13 revealed a total cholesterol of 185, LDL 124 HDL of Van MD [facility] 04/30/2013 10:12 AM

## 2013-04-30 NOTE — Patient Instructions (Signed)
Dr Berry wants you to follow-up in 1 year . You will receive a reminder letter in the mail two months in advance. If you don't receive a letter, please call our office to schedule the follow-up appointment. 

## 2013-05-28 ENCOUNTER — Other Ambulatory Visit: Payer: Self-pay | Admitting: Cardiovascular Disease

## 2013-06-10 ENCOUNTER — Telehealth: Payer: Self-pay | Admitting: *Deleted

## 2013-06-10 NOTE — Telephone Encounter (Signed)
Returned call and pt verified x 2 w/ [name], pt's daughter.  Stated pt is visiting her in [place].  Stated pt c/o pain that starts in the back of his L shoulder, down his L arm to L elbow that shoots back up the arm to R shoulder and arm.  Stated it has happened several times and leaves a tingling feeling in his arms after it is done.  Stated it doesn't last long.  Stated pt denied CP, SOB or other symptoms.  Informed symptoms sound more like they are nerve-related than heart.  Advised Urgent Care for evaluation.  Daughter verbalized understanding and agreed w/ plan.

## 2013-06-10 NOTE — Telephone Encounter (Signed)
Pt is in [place] visiting his daughter and she called concerned because he is getting a pain that starts in his shoulder blade (Left) and then goes down his left arm back across his back side and then down to the right elbow. It has happened several times today and [name] wants to know if they need to take him to the emergency room.  JB

## 2013-08-08 ENCOUNTER — Telehealth: Payer: Self-pay

## 2013-08-08 NOTE — Telephone Encounter (Signed)
PT HAS APPT ON 4/28 AND WANTS TO KNOW IF HE IS SUPPOSED TO FAST BEFORE THE VISIT? WILL THERE BE BLOOD WORK? IF THEY ARE NOT HOME, PLEASE LEAVE A MESSAGE ON MACHINE WITH INSTRUCTIONS.

## 2013-08-12 NOTE — Telephone Encounter (Signed)
Spoke with wife advised her that patient can come in at 7:30 to get blood drawn

## 2013-08-13 ENCOUNTER — Other Ambulatory Visit: Payer: Self-pay | Admitting: Emergency Medicine

## 2013-08-13 ENCOUNTER — Ambulatory Visit (INDEPENDENT_AMBULATORY_CARE_PROVIDER_SITE_OTHER): Payer: Medicare Other | Admitting: Emergency Medicine

## 2013-08-13 ENCOUNTER — Encounter: Payer: Self-pay | Admitting: Emergency Medicine

## 2013-08-13 VITALS — BP 129/63 | HR 68 | Temp 97.9°F | Resp 16 | Ht 68.0 in | Wt 161.0 lb

## 2013-08-13 DIAGNOSIS — N289 Disorder of kidney and ureter, unspecified: Secondary | ICD-10-CM

## 2013-08-13 DIAGNOSIS — D72819 Decreased white blood cell count, unspecified: Secondary | ICD-10-CM

## 2013-08-13 DIAGNOSIS — I10 Essential (primary) hypertension: Secondary | ICD-10-CM

## 2013-08-13 DIAGNOSIS — Z23 Encounter for immunization: Secondary | ICD-10-CM

## 2013-08-13 DIAGNOSIS — E785 Hyperlipidemia, unspecified: Secondary | ICD-10-CM

## 2013-08-13 LAB — COMPLETE METABOLIC PANEL WITH GFR
ALBUMIN: 4.3 g/dL (ref 3.5–5.2)
ALT: 11 U/L (ref 0–53)
AST: 15 U/L (ref 0–37)
Alkaline Phosphatase: 70 U/L (ref 39–117)
BUN: 22 mg/dL (ref 6–23)
CALCIUM: 9.2 mg/dL (ref 8.4–10.5)
CHLORIDE: 105 meq/L (ref 96–112)
CO2: 27 mEq/L (ref 19–32)
CREATININE: 1.41 mg/dL — AB (ref 0.50–1.35)
GFR, Est African American: 52 mL/min — ABNORMAL LOW
GFR, Est Non African American: 45 mL/min — ABNORMAL LOW
Glucose, Bld: 85 mg/dL (ref 70–99)
POTASSIUM: 4.2 meq/L (ref 3.5–5.3)
Sodium: 140 mEq/L (ref 135–145)
Total Bilirubin: 1.4 mg/dL — ABNORMAL HIGH (ref 0.2–1.2)
Total Protein: 6.7 g/dL (ref 6.0–8.3)

## 2013-08-13 LAB — CBC WITH DIFFERENTIAL/PLATELET
BASOS ABS: 0 10*3/uL (ref 0.0–0.1)
Basophils Relative: 0 % (ref 0–1)
Eosinophils Absolute: 0.1 10*3/uL (ref 0.0–0.7)
Eosinophils Relative: 2 % (ref 0–5)
HCT: 44 % (ref 39.0–52.0)
Hemoglobin: 15.4 g/dL (ref 13.0–17.0)
LYMPHS PCT: 30 % (ref 12–46)
Lymphs Abs: 1.2 10*3/uL (ref 0.7–4.0)
MCH: 30.6 pg (ref 26.0–34.0)
MCHC: 35 g/dL (ref 30.0–36.0)
MCV: 87.5 fL (ref 78.0–100.0)
Monocytes Absolute: 0.4 10*3/uL (ref 0.1–1.0)
Monocytes Relative: 10 % (ref 3–12)
NEUTROS ABS: 2.3 10*3/uL (ref 1.7–7.7)
Neutrophils Relative %: 58 % (ref 43–77)
PLATELETS: 141 10*3/uL — AB (ref 150–400)
RBC: 5.03 MIL/uL (ref 4.22–5.81)
RDW: 14.6 % (ref 11.5–15.5)
WBC: 4 10*3/uL (ref 4.0–10.5)

## 2013-08-13 LAB — LIPID PANEL
CHOL/HDL RATIO: 4 ratio
Cholesterol: 175 mg/dL (ref 0–200)
HDL: 44 mg/dL (ref >39)
LDL CALC: 108 mg/dL — AB (ref 0–99)
TRIGLYCERIDES: 115 mg/dL (ref <150)
VLDL: 23 mg/dL (ref 0–40)

## 2013-08-13 NOTE — Progress Notes (Signed)
   This chart was scribed for Richard Russian, MD by Richard Reed, ED Scribe. This patient was seen in room 22 and the patient's care was started at 9:34 AM.  Subjective:    Patient ID: Richard Reed, male    DOB: 1926/04/06, 80 y.o.   MRN: [id number]   HPI  HPI Comments: [name] is a 80 y.o. male who presents to [facility] for a follow-up evaluation of his blood pressure. His blood pressure here today is 129/63. He states that the only symptom he has been having recently is intermittent lightheadedness with standing and walking. He has been evaluated for this. He states that he will be having cataract surgery in June 2015 which is believed will correct for his lightheadedness. He states that he has been eating well. He doesn't believe he needs a walker or a cane. He denies any other pain or symptoms. He is UTD on pneumonia and shingles vaccinations. He is unsure if he is UTD on his Tetanus vaccinations- he last remembers having a Tetanus vaccination in 1951.   Patient Active Problem List   Diagnosis Date Noted  . Essential hypertension 04/30/2013  . Hyperlipidemia 04/30/2013  . PAF (paroxysmal atrial fibrillation) 10/29/2012  . Aortic valve insufficiency, moderate 10/29/2012  . Heme positive stool 07/17/2012  . Postop check 05/09/2011  . Biliary colic 40/98/1191  . Prostate ca 04/13/2011  . Acute respiratory failure 03/24/2011  . Thrombocytopenia 03/24/2011  . Encephalopathy 03/24/2011  . Metabolic acidosis 47/82/9562  . Gastroenteritis 03/23/2011  . Dehydration 03/23/2011  . Acute on chronic renal failure 03/23/2011  . Tachycardia 03/23/2011  . Hypotension 03/23/2011  . Hard of hearing 03/23/2011  . Chronic renal insufficiency, stage III (moderate) 03/23/2011  . Pulmonary edema 03/23/2011     Review of Systems     Objective:   Physical Exam  CONSTITUTIONAL: Well developed/well nourished HEAD: Normocephalic/atraumatic EYES: EOMI/PERRL ENMT:  Mucous membranes moist NECK: supple no meningeal signs SPINE:entire spine nontender CV: S1/S2 noted, no murmurs/rubs/gallops noted LUNGS: Lungs are clear to auscultation bilaterally, no apparent distress ABDOMEN: soft, nontender, no rebound or guarding GU:no cva tenderness NEURO: Pt is awake/alert, moves all extremitiesx4 EXTREMITIES: pulses normal, full ROM SKIN: warm, color normal PSYCH: no abnormalities of mood noted     There were no vitals taken for this visit. Assessment & Plan:  Patient looks great routine labs were done today we'll recheck in 6 months. His tetanus will be updated. He is scheduled for cataract surgery.  I personally performed the services described in this documentation, which was scribed in my presence. The recorded information has been reviewed and is accurate.

## 2013-08-14 ENCOUNTER — Telehealth: Payer: Self-pay | Admitting: *Deleted

## 2013-08-14 LAB — BILIRUBIN, FRACTIONATED(TOT/DIR/INDIR)
BILIRUBIN INDIRECT: 1.2 mg/dL (ref 0.2–1.2)
Bilirubin, Direct: 0.2 mg/dL (ref 0.0–0.3)
Total Bilirubin: 1.4 mg/dL — ABNORMAL HIGH (ref 0.2–1.2)

## 2013-08-14 NOTE — Telephone Encounter (Signed)
Dr [name] reviewed the chart and signed giving clearance for cataract surgery.  Form faxed back to [facility]

## 2014-01-09 ENCOUNTER — Other Ambulatory Visit: Payer: Self-pay | Admitting: Cardiovascular Disease

## 2014-01-09 NOTE — Telephone Encounter (Signed)
Rx was sent to pharmacy electronically. 

## 2014-02-11 ENCOUNTER — Ambulatory Visit (INDEPENDENT_AMBULATORY_CARE_PROVIDER_SITE_OTHER): Payer: Medicare Other | Admitting: Emergency Medicine

## 2014-02-11 ENCOUNTER — Encounter: Payer: Self-pay | Admitting: Emergency Medicine

## 2014-02-11 VITALS — BP 155/67 | HR 95 | Temp 97.8°F | Resp 16 | Ht 68.5 in | Wt 163.0 lb

## 2014-02-11 DIAGNOSIS — R42 Dizziness and giddiness: Secondary | ICD-10-CM

## 2014-02-11 DIAGNOSIS — Z23 Encounter for immunization: Secondary | ICD-10-CM

## 2014-02-11 DIAGNOSIS — L723 Sebaceous cyst: Secondary | ICD-10-CM

## 2014-02-11 DIAGNOSIS — E785 Hyperlipidemia, unspecified: Secondary | ICD-10-CM

## 2014-02-11 DIAGNOSIS — C61 Malignant neoplasm of prostate: Secondary | ICD-10-CM

## 2014-02-11 DIAGNOSIS — I10 Essential (primary) hypertension: Secondary | ICD-10-CM

## 2014-02-11 DIAGNOSIS — H5500 Unspecified nystagmus: Secondary | ICD-10-CM

## 2014-02-11 LAB — CBC WITH DIFFERENTIAL/PLATELET
BASOS ABS: 0 10*3/uL (ref 0.0–0.1)
BASOS PCT: 0 % (ref 0–1)
EOS ABS: 0.1 10*3/uL (ref 0.0–0.7)
EOS PCT: 2 % (ref 0–5)
HEMATOCRIT: 43.9 % (ref 39.0–52.0)
Hemoglobin: 15.1 g/dL (ref 13.0–17.0)
Lymphocytes Relative: 25 % (ref 12–46)
Lymphs Abs: 0.8 10*3/uL (ref 0.7–4.0)
MCH: 30.9 pg (ref 26.0–34.0)
MCHC: 34.4 g/dL (ref 30.0–36.0)
MCV: 90 fL (ref 78.0–100.0)
MONO ABS: 0.3 10*3/uL (ref 0.1–1.0)
Monocytes Relative: 8 % (ref 3–12)
NEUTROS ABS: 2.1 10*3/uL (ref 1.7–7.7)
Neutrophils Relative %: 65 % (ref 43–77)
Platelets: 140 10*3/uL — ABNORMAL LOW (ref 150–400)
RBC: 4.88 MIL/uL (ref 4.22–5.81)
RDW: 14.6 % (ref 11.5–15.5)
WBC: 3.3 10*3/uL — ABNORMAL LOW (ref 4.0–10.5)

## 2014-02-11 LAB — LIPID PANEL
CHOL/HDL RATIO: 4.4 ratio
CHOLESTEROL: 171 mg/dL (ref 0–200)
HDL: 39 mg/dL — ABNORMAL LOW (ref >39)
LDL Cholesterol: 115 mg/dL — ABNORMAL HIGH (ref 0–99)
TRIGLYCERIDES: 84 mg/dL (ref <150)
VLDL: 17 mg/dL (ref 0–40)

## 2014-02-11 LAB — COMPLETE METABOLIC PANEL WITH GFR
ALK PHOS: 68 U/L (ref 39–117)
ALT: 9 U/L (ref 0–53)
AST: 16 U/L (ref 0–37)
Albumin: 4 g/dL (ref 3.5–5.2)
BILIRUBIN TOTAL: 1.4 mg/dL — AB (ref 0.2–1.2)
BUN: 22 mg/dL (ref 6–23)
CO2: 23 mEq/L (ref 19–32)
CREATININE: 1.29 mg/dL (ref 0.50–1.35)
Calcium: 8.9 mg/dL (ref 8.4–10.5)
Chloride: 106 mEq/L (ref 96–112)
GFR, EST NON AFRICAN AMERICAN: 50 mL/min — AB
GFR, Est African American: 58 mL/min — ABNORMAL LOW
GLUCOSE: 90 mg/dL (ref 70–99)
POTASSIUM: 4.5 meq/L (ref 3.5–5.3)
Sodium: 138 mEq/L (ref 135–145)
Total Protein: 6.5 g/dL (ref 6.0–8.3)

## 2014-02-11 MED ORDER — CEPHALEXIN 500 MG PO CAPS: 500.0000 mg | ORAL_CAPSULE | Freq: Two times a day (BID) | ORAL | Status: DC

## 2014-02-11 NOTE — Patient Instructions (Signed)
Pneumococcal Conjugate Vaccine: What You Need to Know Your doctor recommends that you, or your child, get a dose of PCV13 today. 1. Why get vaccinated? Pneumococcal conjugate vaccine (called PCV13 or Prevnar 13) is recommended to protect infants and toddlers, and some older children and adults with certain health conditions, from pneumococcal disease. Pneumococcal disease is caused by infection with Streptococcus pneumoniae bacteria. These bacteria can spread from person to person through close contact. Pneumococcal disease can lead to severe health problems, including pneumonia, blood infections, and meningitis. Meningitis is an infection of the covering of the brain. Pneumococcal meningitis is fairly rare (less than 1 case per 100,000 people each year), but it leads to other health problems, including deafness and brain damage. In children, it is fatal in about 1 case out of 10. Children younger than two are at higher risk for serious disease than older children. People with certain medical conditions, people over age 66, and cigarette smokers are also at higher risk. Before vaccine, pneumococcal infections caused many problems each year in the [place] in children younger than 5, including:  more than 700 cases of meningitis,  13,000 blood infections,  about 5 million ear infections, and  about 200 deaths. About 4,000 adults still die each year because of pneumococcal infections. Pneumococcal infections can be hard to treat because some strains are resistant to antibiotics. This makes prevention through vaccination even more important. 2. PCV13 vaccine There are more than 90 types of pneumococcal bacteria. PCV13 protects against 13 of them. These 13 strains cause most severe infections in children and about half of infections in adults.  PCV13 is routinely given to children at 2, 4, 6, and 2-73 months of age. Children in this age range are at greatest risk for serious diseases caused  by pneumococcal infection. PCV13 vaccine may also be recommended for some older children or adults. Your doctor can give you details. A second type of pneumococcal vaccine, called PPSV23, may also be given to some children and adults, including anyone over age 9. There is a separate Vaccine Information Statement for this vaccine. 3. Precautions  Anyone who has ever had a life-threatening allergic reaction to a dose of this vaccine, to an earlier pneumococcal vaccine called PCV7 (or Prevnar), or to any vaccine containing diphtheria toxoid (for example, DTaP), should not get PCV13. Anyone with a severe allergy to any component of PCV13 should not get the vaccine. Tell your doctor if the person being vaccinated has any severe allergies. If the person scheduled for vaccination is sick, your doctor might decide to reschedule the shot on another day. Your doctor can give you more information about any of these precautions. 4. What are the risks of PCV13 vaccine?  With any medicine, including vaccines, there is a chance of side effects. These are usually mild and go away on their own, but serious reactions are also possible. Reported problems associated with PCV13 vary by dose and age, but generally:  About half of children became drowsy after the shot, had a temporary loss of appetite, or had redness or tenderness where the shot was given.  About 1 out of 3 had swelling where the shot was given.  About 1 out of 3 had a mild fever, and about 1 in 20 had a higher fever (over 102.18F).  Up to about 8 out of 10 became fussy or irritable. Adults receiving the vaccine have reported redness, pain, and swelling where the shot was given. Mild fever, fatigue, headache, chills, or  muscle pain have also been reported. Life-threatening allergic reactions from any vaccine are very rare. 5. What if there is a serious reaction? What should I look for?  Look for anything that concerns you, such as signs of a  severe allergic reaction, very high fever, or behavior changes. Signs of a severe allergic reaction can include hives, swelling of the face and throat, difficulty breathing, a fast heartbeat, dizziness, and weakness. These would start a few minutes to a few hours after the vaccination. What should I do?  If you think it is a severe allergic reaction or other emergency that can't wait, call 9-1-1 or get the person to the nearest hospital. Otherwise, call your doctor.  Afterward, the reaction should be reported to the Vaccine Adverse Event Reporting System (VAERS). Your doctor might file this report, or you can do it yourself through the VAERS web site at www.vaers.SamedayNews.es, or by calling 325-120-3326. VAERS is only for reporting reactions. They do not give medical advice. 6. The National Vaccine Injury Compensation Program The [employer] Vaccine Injury Compensation Program (VICP) is a federal program that was created to compensate people who may have been injured by certain vaccines. Persons who believe they may have been injured by a vaccine can learn about the program and about filing a claim by calling 5793920579 or visiting the [place] website at GoldCloset.com.ee. 7. How can I learn more?  Ask your doctor.  Call your local or state health department.  Contact the Centers for Disease Control and Prevention (CDC):  Call (951) 118-4403 (1-800-CDC-INFO) or  Visit CDC's website at http://hunter.com/ CDC PCV13 Vaccine VIS (Interim) (06/15/11) Document Released: 01/30/2006 Document Revised: 08/19/2013 Document Reviewed: 05/24/2013 [facility] Patient Information 2015 [place], [place]. This information is not intended to replace advice given to you by your health care provider. Make sure you discuss any questions you have with your health care provider. Influenza Vaccine (Flu Vaccine, Inactivated or Recombinant) 2014-2015: What You Need to Know 1. Why get vaccinated? Influenza  ("flu") is a contagious disease that spreads around the [place] every winter, usually between October and May. Flu is caused by influenza viruses, and is spread mainly by coughing, sneezing, and close contact. Anyone can get flu, but the risk of getting flu is highest among children. Symptoms come on suddenly and may last several days. They can include:  fever/chills  sore throat  muscle aches  fatigue  cough  headache  runny or stuffy nose Flu can make some people much sicker than others. These people include young children, people 62 and older, pregnant women, and people with certain health conditions-such as heart, lung or kidney disease, nervous system disorders, or a weakened immune system. Flu vaccination is especially important for these people, and anyone in close contact with them. Flu can also lead to pneumonia, and make existing medical conditions worse. It can cause diarrhea and seizures in children. Each year thousands of people in the [place] die from flu, and many more are hospitalized. Flu vaccine is the best protection against flu and its complications. Flu vaccine also helps prevent spreading flu from person to person. 2. Inactivated and recombinant flu vaccines You are getting an injectable flu vaccine, which is either an "inactivated" or "recombinant" vaccine. These vaccines do not contain any live influenza virus. They are given by injection with a needle, and often called the "flu shot."  A different live, attenuated (weakened) influenza vaccine is sprayed into the nostrils. This vaccine is described in a separate Vaccine Information  Statement. Flu vaccination is recommended every year. Some children 6 months through 34 years of age might need two doses during one year. Flu viruses are always changing. Each year's flu vaccine is made to protect against 3 or 4 viruses that are likely to cause disease that year. Flu vaccine cannot prevent all cases of flu, but  it is the best defense against the disease.  It takes about 2 weeks for protection to develop after the vaccination, and protection lasts several months to a year. Some illnesses that are not caused by influenza virus are often mistaken for flu. Flu vaccine will not prevent these illnesses. It can only prevent influenza. Some inactivated flu vaccine contains a very small amount of a mercury-based preservative called thimerosal. Studies have shown that thimerosal in vaccines is not harmful, but flu vaccines that do not contain a preservative are available. 3. Some people should not get this vaccine Tell the person who gives you the vaccine:  If you have any severe, life-threatening allergies. If you ever had a life-threatening allergic reaction after a dose of flu vaccine, or have a severe allergy to any part of this vaccine, including (for example) an allergy to gelatin, antibiotics, or eggs, you may be advised not to get vaccinated. Most, but not all, types of flu vaccine contain a small amount of egg protein.  If you ever had Guillain-Barr Syndrome (a severe paralyzing illness, also called GBS). Some people with a history of GBS should not get this vaccine. This should be discussed with your doctor.  If you are not feeling well. It is usually okay to get flu vaccine when you have a mild illness, but you might be advised to wait until you feel better. You should come back when you are better. 4. Risks of a vaccine reaction With a vaccine, like any medicine, there is a chance of side effects. These are usually mild and go away on their own. Problems that could happen after any vaccine:  Brief fainting spells can happen after any medical procedure, including vaccination. Sitting or lying down for about 15 minutes can help prevent fainting, and injuries caused by a fall. Tell your doctor if you feel dizzy, or have vision changes or ringing in the ears.  Severe shoulder pain and reduced range of  motion in the arm where a shot was given can happen, very rarely, after a vaccination.  Severe allergic reactions from a vaccine are very rare, estimated at less than 1 in a million doses. If one were to occur, it would usually be within a few minutes to a few hours after the vaccination. Mild problems following inactivated flu vaccine:  soreness, redness, or swelling where the shot was given  hoarseness  sore, red or itchy eyes  cough  fever  aches  headache  itching  fatigue If these problems occur, they usually begin soon after the shot and last 1 or 2 days. Moderate problems following inactivated flu vaccine:  Young children who get inactivated flu vaccine and pneumococcal vaccine (PCV13) at the same time may be at increased risk for seizures caused by fever. Ask your doctor for more information. Tell your doctor if a child who is getting flu vaccine has ever had a seizure. Inactivated flu vaccine does not contain live flu virus, so you cannot get the flu from this vaccine. As with any medicine, there is a very remote chance of a vaccine causing a serious injury or death. The safety of vaccines is  always being monitored. For more information, visit: http://www.aguilar.org/ 5. What if there is a serious reaction? What should I look for?  Look for anything that concerns you, such as signs of a severe allergic reaction, very high fever, or behavior changes. Signs of a severe allergic reaction can include hives, swelling of the face and throat, difficulty breathing, a fast heartbeat, dizziness, and weakness. These would start a few minutes to a few hours after the vaccination. What should I do?  If you think it is a severe allergic reaction or other emergency that can't wait, call 9-1-1 and get the person to the nearest hospital. Otherwise, call your doctor.  Afterward, the reaction should be reported to the Vaccine Adverse Event Reporting System (VAERS). Your doctor should  file this report, or you can do it yourself through the VAERS web site at www.vaers.SamedayNews.es, or by calling 442-766-5400. VAERS does not give medical advice. 6. The National Vaccine Injury Compensation Program The [employer] Vaccine Injury Compensation Program (VICP) is a federal program that was created to compensate people who may have been injured by certain vaccines. Persons who believe they may have been injured by a vaccine can learn about the program and about filing a claim by calling (617) 810-2388 or visiting the [place] website at GoldCloset.com.ee. There is a time limit to file a claim for compensation. 7. How can I learn more?  Ask your health care provider.  Call your local or state health department.  Contact the Centers for Disease Control and Prevention (CDC):  Call 256-264-5061 (1-800-CDC-INFO) or  Visit CDC's website at https://gibson.com/ CDC Vaccine Information Statement (Interim) Inactivated Influenza Vaccine (12/04/2012) Document Released: 01/27/2006 Document Revised: 08/19/2013 Document Reviewed: 03/22/2013 [facility] Patient Information 2015 [place]. This information is not intended to replace advice given to you by your health care provider. Make sure you discuss any questions you have with your health care provider.

## 2014-02-11 NOTE — Progress Notes (Signed)
Subjective:  This chart was scribed for Arlyss Queen, MD by Donato Schultz, Medical Scribe. This patient was seen in Room 22 and the patient's care was started at 8:54 AM.   Patient ID: Richard Reed, male    DOB: 1925/11/30, 80 y.o.   MRN: [id number]  HPI HPI Comments: Richard Reed is a 80 y.o. male who presents to the [facility] for a 6 month follow-up.  He had cataract surgery performed on both eyes in June and saw improvement in his vision.  However, 3 days after his surgery he had to change his eyeglasses prescription twice due to worsening vision.  He states that the vision in one eye is worse than the other and his hearing has not been affected.  He has been complaining of constant dizziness that makes him feel unsteady and that things were moving back and forth.  He has not fallen but has felt like he is going to fall.  20 years ago he fell from a scaffolding and fell on his head.  He denies chest pain and SOB as associated symptoms.  He is also complaining of a painful abscess on his back.  The patient's wife states that the area has been draining odorous, purulent pus.  He has had the area frozen in the past but states that the wart keeps reappearing.  He has an appointment Dr. Wynetta Emery tomorrow.  He sees his cardiologist once a year.     Patient Active Problem List   Diagnosis Date Noted  . Essential hypertension 04/30/2013  . Hyperlipidemia 04/30/2013  . PAF (paroxysmal atrial fibrillation) 10/29/2012  . Aortic valve insufficiency, moderate 10/29/2012  . Heme positive stool 07/17/2012  . Postop check 05/09/2011  . Biliary colic 40/01/2724  . Prostate ca 04/13/2011  . Acute respiratory failure 03/24/2011  . Thrombocytopenia 03/24/2011  . Encephalopathy 03/24/2011  . Metabolic acidosis 36/64/4034  . Gastroenteritis 03/23/2011  . Dehydration 03/23/2011  . Acute on chronic renal failure 03/23/2011  . Tachycardia 03/23/2011  . Hypotension 03/23/2011  . Hard  of hearing 03/23/2011  . Chronic renal insufficiency, stage III (moderate) 03/23/2011  . Pulmonary edema 03/23/2011   Past Medical History  Diagnosis Date  . Irregular heart rate   . Kidney stones   . Hard of hearing   . CKD (chronic kidney disease)   . HTN (hypertension)   . Hiatal hernia   . Sepsis due to Escherichia coli 03/24/11  . Dysrhythmia   . Angina   . Pneumonia 03/23/2011    "first time"  . Cancer   . Prostate cancer   . Arthritis pain   . Arthritis   . PAF (paroxysmal atrial fibrillation) 10/29/2012  . Aortic valve insufficiency, moderate 10/29/2012  . CAD (coronary artery disease)     non obstructive  . Hyperlipidemia    Past Surgical History  Procedure Laterality Date  . Cholecystectomy  04/15/2011    Procedure: LAPAROSCOPIC CHOLECYSTECTOMY WITH INTRAOPERATIVE CHOLANGIOGRAM;  Surgeon: Belva Crome, MD;  Location: [facility];  Service: General;  Laterality: N/A;  . Prostate surgery      approx 10 years ago, seed implant radiation  . Cardiac catheterization  09/2010    non obstructive CAD, 40-60% ostial RCA stenosis without dampening    No Known Allergies Prior to Admission medications   Medication Sig Start Date End Date Taking? Authorizing Provider  aspirin EC 81 MG tablet Take 81 mg by mouth daily.    Historical Provider,  MD  cholestyramine (QUESTRAN) 4 G packet Take 4 g by mouth as needed.    Historical Provider, MD  fish oil-omega-3 fatty acids 1000 MG capsule Take 1 g by mouth daily.     Historical Provider, MD  metoprolol tartrate (LOPRESSOR) 25 MG tablet TAKE ONE-HALF TABLET BY MOUTH TWICE A DAY 01/09/14   Lorretta Harp, MD  Multiple Vitamin (MULTIVITAMIN) tablet Take 1 tablet by mouth daily.    Historical Provider, MD  OVER THE COUNTER MEDICATION OTC Calcium chewable    Historical Provider, MD  tamsulosin (FLOMAX) 0.4 MG CAPS Take 0.4 mg by mouth 2 (two) times daily. 04/17/11   Donnal Moat, MD   History   Social History  . Marital Status:  Married    Spouse Name: N/A    Number of Children: N/A  . Years of Education: N/A   Occupational History  . Retired    Social History Main Topics  . Smoking status: Never Smoker   . Smokeless tobacco: Never Used  . Alcohol Use: No  . Drug Use: No  . Sexual Activity: Yes   Other Topics Concern  . Not on file   Social History Narrative   Medical Decision Maker:  MAT STUARD (wife) Cell. [phone] House: [phone].   Daughter: Nigel Sloop: Cell [phone]   Full Code     Review of Systems  HENT: Negative for hearing loss.   Eyes: Positive for visual disturbance.  Respiratory: Negative for shortness of breath.   Cardiovascular: Negative for chest pain.  Neurological: Positive for dizziness.     Objective:  Physical Exam  Nursing note and vitals reviewed. Constitutional: He is oriented to person, place, and time. He appears well-developed and well-nourished.  HENT:  Head: Normocephalic and atraumatic.  Bilateral hearing aids.  Eyes: Right eye exhibits nystagmus. Left eye exhibits nystagmus.  Bilateral vertical nystagmus.    Neck: Normal range of motion. Neck supple. Carotid bruit is not present. No thyromegaly present.  Cardiovascular: Normal rate and regular rhythm.   Murmur heard.  Systolic murmur is present with a grade of 2/6   Diastolic murmur is present with a grade of 2/6  2/6 diastolic and 2/6 systolic murmur over the base of the heart.  Pulmonary/Chest: Effort normal and breath sounds normal. No respiratory distress. He has no wheezes. He has no rales.  Musculoskeletal: Normal range of motion.  Lymphadenopathy:    He has no cervical adenopathy.  Neurological: He is alert and oriented to person, place, and time.  Skin: Skin is warm and dry.  2x2cm infected sebaceous cyst on mid-back approximately 3ccs of thick sebaceous, foul-smelling debris expressed.  Patient tolerated well.  Psychiatric: He has a normal mood and affect. His behavior is  normal.     BP 155/67  Pulse 95  Temp(Src) 97.8 F (36.6 C)  Resp 16  Ht 5' 8.5" (1.74 m)  Wt 163 lb (73.936 kg)  BMI 24.42 kg/m2  SpO2 93% Assessment & Plan:  Culture done of the infected cyst.  Will treat with Keflex twice daily pending culture results.  He is due to see the dermatologist tomorrow.  Will schedule an MRI of the brain.  This will be without contrast because of his age.  Will hold off on carotid studies.  Routine blood work done today.  He was given a flu shot and Prevnar vaccine.   I personally performed the services described in this documentation, which was scribed in my presence. The recorded information has  been reviewed and is accurate.

## 2014-02-12 ENCOUNTER — Telehealth: Payer: Self-pay | Admitting: Emergency Medicine

## 2014-02-12 ENCOUNTER — Telehealth: Payer: Self-pay

## 2014-02-12 NOTE — Telephone Encounter (Signed)
Call labs stable.No changes

## 2014-02-12 NOTE — Telephone Encounter (Signed)
Peer to peer is required to further process this prior auth.  Member Information: Richard Reed, Richard Reed Member #: [id number] [address] [place] , [place] [id number] Date of Birth: 1925-09-23  [phone]

## 2014-02-12 NOTE — Telephone Encounter (Signed)
Spoke with Barnett Applebaum.  Has an audiogram been done? No.  Spoke with Physician. Authorization # [id number] Date of expiration: 03/13/2014

## 2014-02-12 NOTE — Telephone Encounter (Signed)
LM normal labs

## 2014-02-13 LAB — WOUND CULTURE
Gram Stain: NONE SEEN
Gram Stain: NONE SEEN
Gram Stain: NONE SEEN

## 2014-02-19 ENCOUNTER — Other Ambulatory Visit: Payer: Medicare Other

## 2014-02-21 ENCOUNTER — Ambulatory Visit
Admission: RE | Admit: 2014-02-21 | Discharge: 2014-02-21 | Disposition: A | Discharge status: Home / Self Care | Payer: Medicare Other | Source: Ambulatory Visit | Attending: Emergency Medicine | Admitting: Emergency Medicine

## 2014-02-21 DIAGNOSIS — H5500 Unspecified nystagmus: Secondary | ICD-10-CM

## 2014-02-21 DIAGNOSIS — R42 Dizziness and giddiness: Secondary | ICD-10-CM

## 2014-02-28 ENCOUNTER — Telehealth: Payer: Self-pay

## 2014-02-28 NOTE — Telephone Encounter (Signed)
Patient wife states Aaron Edelman gave her husband's MRI results yesterday and she would like [place] to fax the results to Dr. Talbert Forest at [facility]. Their phone number is [phone] and fax is [phone]

## 2014-03-03 NOTE — Telephone Encounter (Signed)
MRI report faxed thru Epic.

## 2014-03-19 ENCOUNTER — Encounter: Payer: Self-pay | Admitting: Neurology

## 2014-03-19 ENCOUNTER — Ambulatory Visit (INDEPENDENT_AMBULATORY_CARE_PROVIDER_SITE_OTHER): Payer: Medicare Other | Admitting: Neurology

## 2014-03-19 VITALS — BP 170/84 | HR 86 | Ht 68.0 in | Wt 164.8 lb

## 2014-03-19 DIAGNOSIS — H539 Unspecified visual disturbance: Secondary | ICD-10-CM | POA: Insufficient documentation

## 2014-03-19 DIAGNOSIS — R5383 Other fatigue: Secondary | ICD-10-CM

## 2014-03-19 DIAGNOSIS — R5381 Other malaise: Secondary | ICD-10-CM

## 2014-03-19 DIAGNOSIS — E538 Deficiency of other specified B group vitamins: Secondary | ICD-10-CM

## 2014-03-19 DIAGNOSIS — H532 Diplopia: Secondary | ICD-10-CM

## 2014-03-19 HISTORY — DX: Unspecified visual disturbance: H53.9

## 2014-03-19 NOTE — Patient Instructions (Signed)
Diplopia  °Double vision (diplopia) means that you are seeing two of everything. Diplopia usually occurs with both eyes open, and may be worse when looking in one particular direction. If both eyes must be open to see double, this is called binocular diplopia. If double images are seen in just one eye, this is called monocular diplopia.  °CAUSES  °Binocular Diplopia °· Disorder affecting the muscles that move the eyes or the nerves that control those muscles. °· Tumor or other mass pushing on an eye from beside or behind the eye. °· Myasthenia gravis. This is a neuromuscular illness that causes the body's muscles to tire easily. The eye muscles and eyelid muscles become weak. The eyes do not track together well. °· Grave's disease. This is an overactivity of the thyroid gland. This condition causes swelling of tissues around the eyes. This produces a bulging out of the eyeball. °· Blowout fracture of the bone around the eye. The muscles of the eye socket are damaged. This often happens when the eye is hit with force. °· Complications after certain surgeries, such as a lens implant after cataract surgery. °· Fluid-filled mass (abscess) behind or beside the eye, infection, or abnormal connection between arteries and veins. °Sometimes, no cause is found. °Monocular Diplopia °· Problems with corrective lens or contacts. °· Corneal abrasion, infection, severe injury, or bulging and irregularity of the corneal surface (keratoconus). °· Irregularities of the pupil from drugs, severe injury, or other causes. °· Problems involving the lens of the eye, such as opacities or cataracts. °· Complications after certain surgeries, such as a lens implant after cataract surgery. °· Retinal detachment or problems involving the blood vessels of the retina. °Sometimes, no cause is found. °SYMPTOMS  °Binocular Diplopia °· When one eye is closed or covered, the double images disappear. °Monocular Diplopia °· When the unaffected eye is  closed or covered, the double images remain. The double images disappear when the affected eye is closed or covered. °DIAGNOSIS  °A diagnosis is made during an eye exam. °TREATMENT  °Treatment depends on the cause or underlying disease.  °· Relief of double vision symptoms may be achieved by patching one eye or by using special glasses. °· Surgery on the muscles of the eye may be needed. °SEEK IMMEDIATE MEDICAL CARE IF:  °· You see two images of a single object you are looking at, either with both eyes open or with just one eye open. °Document Released: 02/04/2004 Document Revised: 06/27/2011 Document Reviewed: 11/21/2007 °ExitCare® Patient Information ©2015 [employer]. This information is not intended to replace advice given to you by your health care provider. Make sure you discuss any questions you have with your health care provider. ° °

## 2014-03-19 NOTE — Progress Notes (Signed)
Reason for visit: Oscillopsia  Richard Reed is a 80 y.o. male  History of present illness:  Richard Reed is an 80 year old left-handed white male with a history of cataract surgery that occurred in June 2015. The patient indicates that after the second surgery, he developed issues with oscillopsia, with objects moving in a vertical plane. The patient also developed blurred vision, and he believes that the right eye is more blurred than the left. He has had some double vision in the past, but covering one eye or the other will improve this. The patient was given a trial with glasses, but this did not help. The patient has not reported any headaches, dizziness, nausea vomiting. The patient denies any confusion or blackout events. He reports no numbness or weakness on the face, arms, or legs. He has had no problem with slurred speech or difficulty swallowing. He denies any issues controlling the bowels or the bladder. He has been set for MRI evaluation of the brain that was reviewed on line, and it shows very minimal white matter changes, with no evidence of involvement of the brainstem. He is sent to this office for an evaluation.  Past Medical History  Diagnosis Date  . Irregular heart rate   . Kidney stones   . Hard of hearing   . CKD (chronic kidney disease)   . HTN (hypertension)   . Hiatal hernia   . Sepsis due to Escherichia coli 03/24/11  . Dysrhythmia   . Angina   . Pneumonia 03/23/2011    "first time"  . Cancer   . Prostate cancer   . Arthritis pain   . Arthritis   . PAF (paroxysmal atrial fibrillation) 10/29/2012  . Aortic valve insufficiency, moderate 10/29/2012  . CAD (coronary artery disease)     non obstructive  . Hyperlipidemia   . Visual disturbance 03/19/2014  . HOH (hard of hearing)     Past Surgical History  Procedure Laterality Date  . Cholecystectomy  04/15/2011    Procedure: LAPAROSCOPIC CHOLECYSTECTOMY WITH INTRAOPERATIVE CHOLANGIOGRAM;  Surgeon: Belva Crome, MD;  Location: [facility];  Service: General;  Laterality: N/A;  . Prostate surgery      approx 10 years ago, seed implant radiation  . Cardiac catheterization  09/2010    non obstructive CAD, 40-60% ostial RCA stenosis without dampening   . Cataract extraction Bilateral   . Tonsillectomy      Family History  Problem Relation Age of Onset  . Heart failure Mother   . Heart disease Mother   . Heart disease Father   . Cancer Brother     prostate    Social history:  reports that he has never smoked. He has never used smokeless tobacco. He reports that he does not drink alcohol or use illicit drugs.  Medications:  Current Outpatient Prescriptions on File Prior to Visit  Medication Sig Dispense Refill  . aspirin EC 81 MG tablet Take 81 mg by mouth daily.    . fish oil-omega-3 fatty acids 1000 MG capsule Take 1 g by mouth daily.     . metoprolol tartrate (LOPRESSOR) 25 MG tablet TAKE ONE-HALF TABLET BY MOUTH TWICE A DAY 30 tablet 4  . Multiple Vitamin (MULTIVITAMIN) tablet Take 1 tablet by mouth daily.    Marland Kitchen OVER THE COUNTER MEDICATION OTC Calcium chewable    . tamsulosin (FLOMAX) 0.4 MG CAPS Take 0.4 mg by mouth 2 (two) times daily.     No current facility-administered  medications on file prior to visit.     No Known Allergies  ROS:  Out of a complete 14 system review of symptoms, the patient complains only of the following symptoms, and all other reviewed systems are negative.  Hearing loss Blurred vision, double vision  Blood pressure 170/84, pulse 86, height 5\' 8"  (1.727 m), weight 164 lb 12.8 oz (74.753 kg).  Physical Exam  General: The patient is alert and cooperative at the time of the examination.  Eyes: Pupils are equal, round, and reactive to light. Discs are flat bilaterally.  Neck: The neck is supple, no carotid bruits are noted.  Respiratory: The respiratory examination is clear.  Cardiovascular: The cardiovascular examination reveals an occasionally  irregular rate and rhythm, no obvious murmurs or rubs are noted.  Skin: Extremities are without significant edema.  Neurologic Exam  Mental status: The patient is alert and oriented x 3 at the time of the examination. The patient has apparent normal recent and remote memory, with an apparently normal attention span and concentration ability.  Cranial nerves: Facial symmetry is present. There is good sensation of the face to pinprick and soft touch bilaterally. The strength of the facial muscles and the muscles to head turning and shoulder shrug are normal bilaterally. Speech is well enunciated, no aphasia or dysarthria is noted. Extraocular movements are full. The patient appears to have downbeat nystagmus bilaterally. Visual fields are full. The tongue is midline, and the patient has symmetric elevation of the soft palate. No obvious hearing deficits are noted.  Motor: The motor testing reveals 5 over 5 strength of all 4 extremities. Good symmetric motor tone is noted throughout.  Sensory: Sensory testing is intact to pinprick, soft touch, vibration sensation, and position sense on all 4 extremities. No evidence of extinction is noted.  Coordination: Cerebellar testing reveals good finger-nose-finger and heel-to-shin bilaterally.  Gait and station: Gait is normal. Tandem gait is unsteady. Romberg is negative. No drift is seen.  Reflexes: Deep tendon reflexes are symmetric and normal bilaterally. Toes are downgoing bilaterally.   MRI brain 02/22/14:  IMPRESSION: 1. Moderate atrophy and mild diffuse white matter disease likely reflects the sequela of chronic microvascular ischemia. 2. No acute or focal lesion to explain nystagmus. 3. Moderate spondylosis of the upper cervical spine   Assessment/Plan:  1. Onset of double vision, downbeat nystagmus  The patient has a relatively unremarkable clinical examination with exception of the downbeat nystagmus. The patient likely has sustained a  very small medullary stroke that is not apparent by MRI of the brain. The patient is on aspirin therapy. The patient will be set up for further blood work looking for metabolic etiologies of the nystagmus. The patient will follow-up through this office if needed.  Jill Alexanders MD 03/19/2014 7:58 PM  [facility] [address] [place],  [address]  Phone [phone] Fax [phone]

## 2014-04-02 ENCOUNTER — Telehealth: Payer: Self-pay | Admitting: Neurology

## 2014-04-02 LAB — ACETYLCHOLINE RECEPTOR, BINDING

## 2014-04-02 LAB — VITAMIN B1, WHOLE BLOOD: Thiamine: 161.7 nmol/L (ref 66.5–200.0)

## 2014-04-02 LAB — TSH: TSH: 2.08 u[IU]/mL (ref 0.450–4.500)

## 2014-04-02 LAB — COPPER, SERUM: COPPER: 68 ug/dL — AB (ref 72–166)

## 2014-04-02 LAB — SEDIMENTATION RATE: SED RATE: 8 mm/h (ref 0–30)

## 2014-04-02 LAB — VITAMIN B12: VITAMIN B 12: 1252 pg/mL — AB (ref 211–946)

## 2014-04-02 LAB — ANGIOTENSIN CONVERTING ENZYME: Angio Convert Enzyme: 55 U/L (ref 14–82)

## 2014-04-02 NOTE — Telephone Encounter (Signed)
I called the patient, talked with the family. The blood work was unremarkable with exception that the serum copper level was slightly low. The patient is to go on a multivitamin containing at least 2 mg daily of copper.

## 2014-04-14 ENCOUNTER — Other Ambulatory Visit: Payer: Self-pay | Admitting: Cardiovascular Disease

## 2014-04-14 NOTE — Telephone Encounter (Signed)
Rx(s) sent to pharmacy electronically. OV 04/30/14

## 2014-04-30 ENCOUNTER — Encounter: Payer: Self-pay | Admitting: Cardiovascular Disease

## 2014-04-30 ENCOUNTER — Ambulatory Visit (INDEPENDENT_AMBULATORY_CARE_PROVIDER_SITE_OTHER): Payer: Medicare Other | Admitting: Cardiovascular Disease

## 2014-04-30 VITALS — BP 140/74 | HR 81 | Ht 68.0 in | Wt 165.7 lb

## 2014-04-30 DIAGNOSIS — I159 Secondary hypertension, unspecified: Secondary | ICD-10-CM

## 2014-04-30 DIAGNOSIS — I480 Paroxysmal atrial fibrillation: Secondary | ICD-10-CM

## 2014-04-30 DIAGNOSIS — I351 Nonrheumatic aortic (valve) insufficiency: Secondary | ICD-10-CM

## 2014-04-30 DIAGNOSIS — I10 Essential (primary) hypertension: Secondary | ICD-10-CM

## 2014-04-30 DIAGNOSIS — E785 Hyperlipidemia, unspecified: Secondary | ICD-10-CM

## 2014-04-30 MED ORDER — METOPROLOL TARTRATE 25 MG PO TABS: ORAL_TABLET | ORAL | Status: DC

## 2014-04-30 NOTE — Progress Notes (Signed)
04/30/2014 Richard Reed   04-06-1926  [id number]  Primary Physician DAUB, Lina Sayre, MD Primary Cardiologist: Lorretta Harp MD Renae Gloss   HPI:  The patient is a very pleasant 81 year old thin-appearing married Caucasian male, father of 28, grandfather to 37 grandchildren, who I last saw 12 months ago.he is accompanied by his wife today. He was hospitalized late 2012 with E. coli bacteremia and septic shock. He did develop some arrhythmias with PAF during that hospitalization. His EF was 45% to 50% with moderate aortic insufficiency. His other problems include hypertension and hyperlipidemia. I catheterized him in June 2012, revealing normal coronary arteries, with 40% to 60% ostial right coronary artery stenosis without damping. His right heart catheterization was unremarkable. He has had an uncomplicated laparoscopic cholecystectomy performed by Dr. Chucky May, April 12, 2011. His most recent lab work performed 02/11/14 revealed a total cholesterol 171, LDL 1:15 and HDL of 39. His most recent 2-D echo performed 11/26/12 revealed normal LV systolic function, moderate aortic insufficiency and mitral regurgitation. He remains asymptomatic.   Current Outpatient Prescriptions  Medication Sig Dispense Refill  . aspirin EC 81 MG tablet Take 81 mg by mouth daily.    . Calcium 500 MG CHEW Chew by mouth daily.    . fish oil-omega-3 fatty acids 1000 MG capsule Take 1 g by mouth daily.     . metoprolol tartrate (LOPRESSOR) 25 MG tablet TAKE 1/2 TABLET BY MOUTH TWICE DAILY. 30 tablet 11  . Multiple Vitamin (MULTIVITAMIN) tablet Take 1 tablet by mouth daily.    . tamsulosin (FLOMAX) 0.4 MG CAPS Take 0.4 mg by mouth 2 (two) times daily.     No current facility-administered medications for this visit.    No Known Allergies  History   Social History  . Marital Status: Married    Spouse Name: N/A    Number of Children: 2  . Years of Education: HS   Occupational History  .  Retired    Social History Main Topics  . Smoking status: Never Smoker   . Smokeless tobacco: Never Used  . Alcohol Use: No  . Drug Use: No  . Sexual Activity: Yes   Other Topics Concern  . Not on file   Social History Narrative   Medical Decision Maker:  Richard Reed (wife) Cell. [phone] House: [phone].   Daughter: Richard Reed: Cell [phone]   Full Code   Patient is Left handed.     Review of Systems: General: negative for chills, fever, night sweats or weight changes.  Cardiovascular: negative for chest pain, dyspnea on exertion, edema, orthopnea, palpitations, paroxysmal nocturnal dyspnea or shortness of breath Dermatological: negative for rash Respiratory: negative for cough or wheezing Urologic: negative for hematuria Abdominal: negative for nausea, vomiting, diarrhea, bright red blood per rectum, melena, or hematemesis Neurologic: negative for visual changes, syncope, or dizziness All other systems reviewed and are otherwise negative except as noted above.    Blood pressure 140/74, pulse 81, height 5\' 8"  (1.727 m), weight 165 lb 11.2 oz (75.161 kg).  General appearance: alert and no distress Neck: no adenopathy, no carotid bruit, no JVD, supple, symmetrical, trachea midline and thyroid not enlarged, symmetric, no tenderness/mass/nodules Lungs: clear to auscultation bilaterally Heart: regular rate and rhythm, S1, S2 normal, no murmur, click, rub or gallop Extremities: extremities normal, atraumatic, no cyanosis or edema  EKG normal sinus rhythm at 84 without ST or T-wave changes. I personally reviewed this EKG  ASSESSMENT AND PLAN:  PAF (paroxysmal atrial fibrillation) PAF maintaining sinus rhythm   Hyperlipidemia History of hyperlipidemia with recent lipid profile performed 02/11/14 revealed a total cholesterol 171, LDL of 1:15 and HDL of 39. The patient is not on a statin drug   Essential hypertension History of hypertension with blood  pressure measured at 140/74 on metoprolol. Continue current meds at current dosing   Aortic valve insufficiency, moderate History of moderate aortic insufficiency and mitral regurgitation with normal LV systolic function and mild LV dilatation by 2-D echo performed most recently 11/26/12. Patient is asymptomatic       Lorretta Harp MD [facility], [facility] 04/30/2014 2:05 PM

## 2014-04-30 NOTE — Assessment & Plan Note (Signed)
History of hyperlipidemia with recent lipid profile performed 02/11/14 revealed a total cholesterol 171, LDL of 1:15 and HDL of 39. The patient is not on a statin drug

## 2014-04-30 NOTE — Assessment & Plan Note (Signed)
History of moderate aortic insufficiency and mitral regurgitation with normal LV systolic function and mild LV dilatation by 2-D echo performed most recently 11/26/12. Patient is asymptomatic

## 2014-04-30 NOTE — Assessment & Plan Note (Signed)
History of hypertension with blood pressure measured at 140/74 on metoprolol. Continue current meds at current dosing

## 2014-04-30 NOTE — Patient Instructions (Signed)
Your physician wants you to follow-up in: 1 year with Dr Berry. You will receive a reminder letter in the mail two months in advance. If you don't receive a letter, please call our office to schedule the follow-up appointment.  

## 2014-04-30 NOTE — Assessment & Plan Note (Signed)
PAF maintaining sinus rhythm

## 2014-11-17 ENCOUNTER — Encounter: Payer: Self-pay | Admitting: Cardiovascular Disease

## 2014-12-02 ENCOUNTER — Telehealth: Payer: Self-pay | Admitting: Neurology

## 2014-12-02 ENCOUNTER — Encounter: Payer: Self-pay | Admitting: Emergency Medicine

## 2014-12-02 ENCOUNTER — Encounter: Payer: Self-pay | Admitting: Neurology

## 2014-12-02 ENCOUNTER — Telehealth: Payer: Self-pay

## 2014-12-02 ENCOUNTER — Ambulatory Visit (INDEPENDENT_AMBULATORY_CARE_PROVIDER_SITE_OTHER): Payer: Medicare Other | Admitting: Neurology

## 2014-12-02 ENCOUNTER — Ambulatory Visit (INDEPENDENT_AMBULATORY_CARE_PROVIDER_SITE_OTHER): Payer: Medicare Other | Admitting: Emergency Medicine

## 2014-12-02 VITALS — BP 120/66 | HR 67 | Temp 97.8°F | Resp 16 | Ht 68.0 in | Wt 161.4 lb

## 2014-12-02 VITALS — BP 130/78 | HR 69 | Ht 69.0 in | Wt 162.0 lb

## 2014-12-02 DIAGNOSIS — R202 Paresthesia of skin: Secondary | ICD-10-CM

## 2014-12-02 DIAGNOSIS — R29810 Facial weakness: Secondary | ICD-10-CM | POA: Diagnosis not present

## 2014-12-02 DIAGNOSIS — G451 Carotid artery syndrome (hemispheric): Secondary | ICD-10-CM

## 2014-12-02 DIAGNOSIS — I480 Paroxysmal atrial fibrillation: Secondary | ICD-10-CM | POA: Diagnosis not present

## 2014-12-02 DIAGNOSIS — H5500 Unspecified nystagmus: Secondary | ICD-10-CM | POA: Diagnosis not present

## 2014-12-02 DIAGNOSIS — G459 Transient cerebral ischemic attack, unspecified: Secondary | ICD-10-CM | POA: Insufficient documentation

## 2014-12-02 DIAGNOSIS — I10 Essential (primary) hypertension: Secondary | ICD-10-CM | POA: Diagnosis not present

## 2014-12-02 LAB — BASIC METABOLIC PANEL WITH GFR
BUN: 31 mg/dL — AB (ref 7–25)
CO2: 24 mmol/L (ref 20–31)
CREATININE: 1.48 mg/dL — AB (ref 0.70–1.11)
Calcium: 9.4 mg/dL (ref 8.6–10.3)
Chloride: 106 mmol/L (ref 98–110)
GFR, EST AFRICAN AMERICAN: 49 mL/min — AB (ref >=60)
GFR, Est Non African American: 42 mL/min — ABNORMAL LOW (ref >=60)
GLUCOSE: 85 mg/dL (ref 65–99)
POTASSIUM: 4.2 mmol/L (ref 3.5–5.3)
Sodium: 139 mmol/L (ref 135–146)

## 2014-12-02 LAB — CBC WITH DIFFERENTIAL/PLATELET
BASOS ABS: 0 10*3/uL (ref 0.0–0.1)
Basophils Relative: 0 % (ref 0–1)
EOS ABS: 0.1 10*3/uL (ref 0.0–0.7)
EOS PCT: 2 % (ref 0–5)
HEMATOCRIT: 44.5 % (ref 39.0–52.0)
Hemoglobin: 15.2 g/dL (ref 13.0–17.0)
LYMPHS ABS: 1.1 10*3/uL (ref 0.7–4.0)
LYMPHS PCT: 30 % (ref 12–46)
MCH: 29.8 pg (ref 26.0–34.0)
MCHC: 34.2 g/dL (ref 30.0–36.0)
MCV: 87.3 fL (ref 78.0–100.0)
MONO ABS: 0.3 10*3/uL (ref 0.1–1.0)
MONOS PCT: 9 % (ref 3–12)
MPV: 9.7 fL (ref 8.6–12.4)
Neutro Abs: 2.1 10*3/uL (ref 1.7–7.7)
Neutrophils Relative %: 59 % (ref 43–77)
PLATELETS: 144 10*3/uL — AB (ref 150–400)
RBC: 5.1 MIL/uL (ref 4.22–5.81)
RDW: 15.2 % (ref 11.5–15.5)
WBC: 3.6 10*3/uL — ABNORMAL LOW (ref 4.0–10.5)

## 2014-12-02 LAB — GLUCOSE, POCT (MANUAL RESULT ENTRY): POC Glucose: 92 mg/dl (ref 70–99)

## 2014-12-02 NOTE — Patient Instructions (Addendum)
We will do an evaluation for a possible TIA event. We will check MRI evaluation of the brain, and a carotid Doppler study. We will set she will for a 30 day heart monitor to look for atrial fibrillation. Remain on aspirin for now. We will follow-up in several months. If another episode occurs, go directly to the emergency room.  Transient Ischemic Attack A transient ischemic attack (TIA) is a "warning stroke" that causes stroke-like symptoms. Unlike a stroke, a TIA does not cause permanent damage to the brain. The symptoms of a TIA can happen very fast and do not last long. It is important to know the symptoms of a TIA and what to do. This can help prevent a major stroke or death. CAUSES   A TIA is caused by a temporary blockage in an artery in the brain or neck (carotid artery). The blockage does not allow the brain to get the blood supply it needs and can cause different symptoms. The blockage can be caused by either:  A blood clot.  Fatty buildup (plaque) in a neck or brain artery. RISK FACTORS  High blood pressure (hypertension).  High cholesterol.  Diabetes mellitus.  Heart disease.  The build up of plaque in the blood vessels (peripheral artery disease or atherosclerosis).  The build up of plaque in the blood vessels providing blood and oxygen to the brain (carotid artery stenosis).  An abnormal heart rhythm (atrial fibrillation).  Obesity.  Smoking.  Taking oral contraceptives (especially in combination with smoking).  Physical inactivity.  A diet high in fats, salt (sodium), and calories.  Alcohol use.  Use of illegal drugs (especially cocaine and methamphetamine).  Being male.  Being African American.  Being over the age of 67.  Family history of stroke.  Previous history of blood clots, stroke, TIA, or heart attack.  Sickle cell disease. SYMPTOMS  TIA symptoms are the same as a stroke but are temporary. These symptoms usually develop suddenly, or  may be newly present upon awakening from sleep:  Sudden weakness or numbness of the face, arm, or leg, especially on one side of the body.  Sudden trouble walking or difficulty moving arms or legs.  Sudden confusion.  Sudden personality changes.  Trouble speaking (aphasia) or understanding.  Difficulty swallowing.  Sudden trouble seeing in one or both eyes.  Double vision.  Dizziness.  Loss of balance or coordination.  Sudden severe headache with no known cause.  Trouble reading or writing.  Loss of bowel or bladder control.  Loss of consciousness. DIAGNOSIS  Your caregiver may be able to determine the presence or absence of a TIA based on your symptoms, history, and physical exam. Computed tomography (CT scan) of the brain is usually performed to help identify a TIA. Other tests may be done to diagnose a TIA. These tests may include:  Electrocardiography.  Continuous heart monitoring.  Echocardiography.  Carotid ultrasonography.  Magnetic resonance imaging (MRI).  A scan of the brain circulation.  Blood tests. PREVENTION  The risk of a TIA can be decreased by appropriately treating high blood pressure, high cholesterol, diabetes, heart disease, and obesity and by quitting smoking, limiting alcohol, and staying physically active. TREATMENT  Time is of the essence. Since the symptoms of TIA are the same as a stroke, it is important to seek treatment as soon as possible because you may need a medicine to dissolve the clot (thrombolytic) that cannot be given if too much time has passed. Treatment options vary. Treatment  options may include rest, oxygen, intravenous (IV) fluids, and medicines to thin the blood (anticoagulants). Medicines and diet may be used to address diabetes, high blood pressure, and other risk factors. Measures will be taken to prevent short-term and long-term complications, including infection from breathing foreign material into the lungs (aspiration  pneumonia), blood clots in the legs, and falls. Treatment options include procedures to either remove plaque in the carotid arteries or dilate carotid arteries that have narrowed due to plaque. Those procedures are:  Carotid endarterectomy.  Carotid angioplasty and stenting. HOME CARE INSTRUCTIONS   Take all medicines prescribed by your caregiver. Follow the directions carefully. Medicines may be used to control risk factors for a stroke. Be sure you understand all your medicine instructions.  You may be told to take aspirin or the anticoagulant warfarin. Warfarin needs to be taken exactly as instructed.  Taking too much or too little warfarin is dangerous. Too much warfarin increases the risk of bleeding. Too little warfarin continues to allow the risk for blood clots. While taking warfarin, you will need to have regular blood tests to measure your blood clotting time. A PT blood test measures how long it takes for blood to clot. Your PT is used to calculate another value called an INR. Your PT and INR help your caregiver to adjust your dose of warfarin. The dose can change for many reasons. It is critically important that you take warfarin exactly as prescribed.  Many foods, especially foods high in vitamin K can interfere with warfarin and affect the PT and INR. Foods high in vitamin K include spinach, kale, broccoli, cabbage, collard and turnip greens, brussels sprouts, peas, cauliflower, seaweed, and parsley as well as beef and pork liver, green tea, and soybean oil. You should eat a consistent amount of foods high in vitamin K. Avoid major changes in your diet, or notify your caregiver before changing your diet. Arrange a visit with a dietitian to answer your questions.  Many medicines can interfere with warfarin and affect the PT and INR. You must tell your caregiver about any and all medicines you take, this includes all vitamins and supplements. Be especially cautious with aspirin and  anti-inflammatory medicines. Do not take or discontinue any prescribed or over-the-counter medicine except on the advice of your caregiver or pharmacist.  Warfarin can have side effects, such as excessive bruising or bleeding. You will need to hold pressure over cuts for longer than usual. Your caregiver or pharmacist will discuss other potential side effects.  Avoid sports or activities that may cause injury or bleeding.  Be mindful when shaving, flossing your teeth, or handling sharp objects.  Alcohol can change the body's ability to handle warfarin. It is best to avoid alcoholic drinks or consume only very small amounts while taking warfarin. Notify your caregiver if you change your alcohol intake.  Notify your dentist or other caregivers before procedures.  Eat a diet that includes 5 or more servings of fruits and vegetables each day. This may reduce the risk of stroke. Certain diets may be prescribed to address high blood pressure, high cholesterol, diabetes, or obesity.  A low-sodium, low-saturated fat, low-trans fat, low-cholesterol diet is recommended to manage high blood pressure.  A low-saturated fat, low-trans fat, low-cholesterol, and high-fiber diet may control cholesterol levels.  A controlled-carbohydrate, controlled-sugar diet is recommended to manage diabetes.  A reduced-calorie, low-sodium, low-saturated fat, low-trans fat, low-cholesterol diet is recommended to manage obesity.  Maintain a healthy weight.  Stay physically active. It  is recommended that you get at least 30 minutes of activity on most or all days.  Do not smoke.  Limit alcohol use even if you are not taking warfarin. Moderate alcohol use is considered to be:  No more than 2 drinks each day for men.  No more than 1 drink each day for nonpregnant women.  Stop drug abuse.  Home safety. A safe home environment is important to reduce the risk of falls. Your caregiver may arrange for specialists to  evaluate your home. Having grab bars in the bedroom and bathroom is often important. Your caregiver may arrange for equipment to be used at home, such as raised toilets and a seat for the shower.  Follow all instructions for follow-up with your caregiver. This is very important. This includes any referrals and lab tests. Proper follow up can prevent a stroke or another TIA from occurring. SEEK MEDICAL CARE IF:  You have personality changes.  You have difficulty swallowing.  You are seeing double.  You have dizziness.  You have a fever.  You have skin breakdown. SEEK IMMEDIATE MEDICAL CARE IF:  Any of these symptoms may represent a serious problem that is an emergency. Do not wait to see if the symptoms will go away. Get medical help right away. Call your local emergency services (911 in [place] Do not drive yourself to the hospital.  You have sudden weakness or numbness of the face, arm, or leg, especially on one side of the body.  You have sudden trouble walking or difficulty moving arms or legs.  You have sudden confusion.  You have trouble speaking (aphasia) or understanding.  You have sudden trouble seeing in one or both eyes.  You have a loss of balance or coordination.  You have a sudden, severe headache with no known cause.  You have new chest pain or an irregular heartbeat.  You have a partial or total loss of consciousness. MAKE SURE YOU:   Understand these instructions.  Will watch your condition.  Will get help right away if you are not doing well or get worse. Document Released: 01/12/2005 Document Revised: 04/09/2013 Document Reviewed: 07/10/2013 [facility] Patient Information 2015 [place], [place]. This information is not intended to replace advice given to you by your health care provider. Make sure you discuss any questions you have with your health care provider.

## 2014-12-02 NOTE — Progress Notes (Signed)
Reason for visit: TIA event  Referring physician: Dr. Chales Abrahams is a 81 y.o. male  History of present illness:  Richard Reed is an 81 year old left-handed white male with a history of downbeat nystagmus, he was seen and evaluated in December 2015, felt to have had a small brain stem stroke that was not apparent by MRI. The patient has continued to have the nystagmus. He has a history of paroxysmal atrial fibrillation, he has been followed by Dr. [name], and he has been noted to be in normal sinus rhythm over the last several years. He has been treated with aspirin, he is not on anticoagulant therapy. The patient was in good health until about 3 days ago, he had a transient episode while eating breakfast Saturday morning lasting only a few seconds. He noted numbness and tingly sensations on the right face, and drawling of the right face. The patient had tingling and numbness of the right arm. He denied any true weakness, headache, visual loss, or confusion. He had no residual deficits, and he has returned back to his usual baseline. He has some chronic gait instability at baseline, no falls. He denies any blackout episodes. He went to Dr. Everlene Farrier today, and he was referred to this office for further evaluation.  Past Medical History  Diagnosis Date  . Irregular heart rate   . Kidney stones   . Hard of hearing   . CKD (chronic kidney disease)   . HTN (hypertension)   . Hiatal hernia   . Sepsis due to Escherichia coli 03/24/11  . Dysrhythmia   . Angina   . Pneumonia 03/23/2011    "first time"  . Cancer   . Prostate cancer   . Arthritis pain   . Arthritis   . PAF (paroxysmal atrial fibrillation) 10/29/2012  . Aortic valve insufficiency, moderate 10/29/2012  . CAD (coronary artery disease)     non obstructive  . Hyperlipidemia   . Visual disturbance 03/19/2014  . HOH (hard of hearing)   . Mitral regurgitation     Past Surgical History  Procedure Laterality Date  .  Cholecystectomy  04/15/2011    Procedure: LAPAROSCOPIC CHOLECYSTECTOMY WITH INTRAOPERATIVE CHOLANGIOGRAM;  Surgeon: Belva Crome, MD;  Location: [place];  Service: General;  Laterality: N/A;  . Prostate surgery      approx 10 years ago, seed implant radiation  . Cardiac catheterization  09/2010    non obstructive CAD, 40-60% ostial RCA stenosis without dampening   . Cataract extraction Bilateral   . Tonsillectomy      Family History  Problem Relation Age of Onset  . Heart failure Mother   . Heart disease Mother   . Heart disease Father   . Cancer Brother     prostate    Social history:  reports that he has never smoked. He has never used smokeless tobacco. He reports that he does not drink alcohol or use illicit drugs.  Medications:  Prior to Admission medications   Medication Sig Start Date End Date Taking? Authorizing Provider  aspirin EC 81 MG tablet Take 81 mg by mouth daily.   Yes Historical Provider, MD  Calcium 500 MG CHEW Chew by mouth daily.   Yes Historical Provider, MD  fish oil-omega-3 fatty acids 1000 MG capsule Take 1 g by mouth daily.    Yes Historical Provider, MD  metoprolol tartrate (LOPRESSOR) 25 MG tablet TAKE 1/2 TABLET BY MOUTH TWICE DAILY. 04/30/14  Yes Pearletha Forge  [name], MD  Multiple Vitamin (MULTIVITAMIN) tablet Take 1 tablet by mouth daily.   Yes Historical Provider, MD  tamsulosin (FLOMAX) 0.4 MG CAPS Take 0.4 mg by mouth daily.  04/17/11  Yes [name] Christain Sacramento, MD     No Known Allergies  ROS:  Out of a complete 14 system review of symptoms, the patient complains only of the following symptoms, and all other reviewed systems are negative.  Transient right sided numbness Hard of hearing  Blood pressure 130/78, pulse 69, height 5\' 9"  (1.753 m), weight 162 lb (73.483 kg).  Physical Exam  General: The patient is alert and cooperative at the time of the examination. The patient is hard of hearing.  Eyes: Pupils are equal, round, and reactive to  light. Discs are flat bilaterally.  Neck: The neck is supple, no carotid bruits are noted.  Respiratory: The respiratory examination is clear.  Cardiovascular: The cardiovascular examination reveals a regular rate and rhythm, no obvious murmurs or rubs are noted.  Skin: Extremities are without significant edema.  Neurologic Exam  Mental status: The patient is alert and oriented x 3 at the time of the examination. The patient has apparent normal recent and remote memory, with an apparently normal attention span and concentration ability.  Cranial nerves: Facial symmetry is present. There is good sensation of the face to pinprick and soft touch bilaterally. The strength of the facial muscles and the muscles to head turning and shoulder shrug are normal bilaterally. Speech is well enunciated, no aphasia or dysarthria is noted. Extraocular movements are full. Visual fields are full. The tongue is midline, and the patient has symmetric elevation of the soft palate. No obvious hearing deficits are noted. Downbeat nystagmus is noted.  Motor: The motor testing reveals 5 over 5 strength of all 4 extremities. Good symmetric motor tone is noted throughout.  Sensory: Sensory testing is intact to pinprick, soft touch, vibration sensation, and position sense on all 4 extremities, with exception there is a stocking pattern pinprick sensory deficit up to the knees bilaterally. No evidence of extinction is noted.  Coordination: Cerebellar testing reveals good finger-nose-finger and heel-to-shin bilaterally.  Gait and station: Gait is slightly wide-based, the patient is able to ambulate independently. Tandem gait is unsteady. Romberg is negative. No drift is seen.  Reflexes: Deep tendon reflexes are symmetric, but are depressed bilaterally. Toes are downgoing bilaterally.   Assessment/Plan:  1. History of downbeat nystagmus   2. Transient right face and arm numbness, probable TIA  The patient is to  remain on aspirin at this time, we will set him up for MRI evaluation of the brain, and a carotid Doppler study. The patient will be set up for a 30 day cardiac monitor, if atrial fibrillation is noted, considerations for anticoagulant therapy should be made. The patient will follow-up in 3-4 months. I have recommended that he not travel to [place] as planned over the next 3 weeks.  Jill Alexanders MD 12/02/2014 7:57 PM  [facility] [address] [place], [place] [address]  Phone [phone] Fax [phone]

## 2014-12-02 NOTE — Progress Notes (Addendum)
This chart was scribed for Arlyss Queen, MD by Leandra Kern, Medical Scribe. This patient was seen in Room 21 and the patient's care was started at 8:17 AM.   Chief Complaint:  Chief Complaint  Patient presents with  . right side drooping mouth    symptoms started on Sat. 11/29/14, feels "Better". took Aspirin  . tingling in right arm    HPI: Richard Reed is a 81 y.o. male with a history of A fib and HTN accompanied with his wife, who reports to [facility] today complaining of right side facial drooping of the mouth, sudden onset Saturday morning (3 days ago).  Pt notes that he started feeling the symptoms while he was having breakfast with his wife. He reports that the symptoms lasted for about 5-6 seconds after massaging the area. Pt also reports that he later  felt a tingling sensation in the right arm. The pt indicates that he got his blood pressure was check which was normal, and he also took a dose of aspirin. He states that he takes Engineer, civil (consulting) daily as a blood thinner. He denies having a similar episode since.   Last ECHO was Augh 2015.    Past Medical History  Diagnosis Date  . Irregular heart rate   . Kidney stones   . Hard of hearing   . CKD (chronic kidney disease)   . HTN (hypertension)   . Hiatal hernia   . Sepsis due to Escherichia coli 03/24/11  . Dysrhythmia   . Angina   . Pneumonia 03/23/2011    "first time"  . Cancer   . Prostate cancer   . Arthritis pain   . Arthritis   . PAF (paroxysmal atrial fibrillation) 10/29/2012  . Aortic valve insufficiency, moderate 10/29/2012  . CAD (coronary artery disease)     non obstructive  . Hyperlipidemia   . Visual disturbance 03/19/2014  . HOH (hard of hearing)   . Mitral regurgitation    Past Surgical History  Procedure Laterality Date  . Cholecystectomy  04/15/2011    Procedure: LAPAROSCOPIC CHOLECYSTECTOMY WITH INTRAOPERATIVE CHOLANGIOGRAM;  Surgeon: Belva Crome, MD;  Location: [place];  Service: General;   Laterality: N/A;  . Prostate surgery      approx 10 years ago, seed implant radiation  . Cardiac catheterization  09/2010    non obstructive CAD, 40-60% ostial RCA stenosis without dampening   . Cataract extraction Bilateral   . Tonsillectomy     Social History   Social History  . Marital Status: Married    Spouse Name: N/A  . Number of Children: 2  . Years of Education: HS   Occupational History  . Retired    Social History Main Topics  . Smoking status: Never Smoker   . Smokeless tobacco: Never Used  . Alcohol Use: No  . Drug Use: No  . Sexual Activity: Yes   Other Topics Concern  . None   Social History Narrative   Medical Decision Maker:  KEIGHAN AMEZCUA (wife) Cell. [phone] House: [phone].   Daughter: Nigel Sloop: Cell [phone]   Full Code   Patient is Left handed.   Family History  Problem Relation Age of Onset  . Heart failure Mother   . Heart disease Mother   . Heart disease Father   . Cancer Brother     prostate   No Known Allergies Prior to Admission medications   Medication Sig Start Date End Date Taking? Authorizing Provider  aspirin EC 81 MG tablet Take 81 mg by mouth daily.    Historical Provider, MD  Calcium 500 MG CHEW Chew by mouth daily.    Historical Provider, MD  fish oil-omega-3 fatty acids 1000 MG capsule Take 1 g by mouth daily.     Historical Provider, MD  metoprolol tartrate (LOPRESSOR) 25 MG tablet TAKE 1/2 TABLET BY MOUTH TWICE DAILY. 04/30/14   Lorretta Harp, MD  Multiple Vitamin (MULTIVITAMIN) tablet Take 1 tablet by mouth daily.    Historical Provider, MD  tamsulosin (FLOMAX) 0.4 MG CAPS Take 0.4 mg by mouth 2 (two) times daily. 04/17/11   Donnal Moat, MD     ROS: The patient has weakness, tingling, facial drooping.  All other systems have been reviewed and were otherwise negative with the exception of those mentioned in the HPI and as above.    PHYSICAL EXAM: Filed Vitals:   12/02/14 0807  BP: 120/66    Pulse: 67  Temp: 97.8 F (36.6 C)  Resp: 16   Body mass index is 24.55 kg/(m^2).   General: Alert, no acute distress HEENT:  Normocephalic, atraumatic, oropharynx patent. Eye: Juliette Mangle [facility] Cardiovascular: Grade 2 diastolic  murmur at base of heart. No carotid bruit are heard. Irregular rhythm. Regular rate,  no rubs or gallops. radial pulse intact. No pedal edema.  Respiratory: Clear to auscultation bilaterally.  No wheezes, rales, or rhonchi.  No cyanosis, no use of accessory musculature Abdominal: No organomegaly, abdomen is soft and non-tender, positive bowel sounds.  No masses. Musculoskeletal: Gait intact. No edema, tenderness Skin: No rashes. Neurologic: Facial musculature symmetric. Cranial nerves 2-12 are intact, no motor weakness, no sensory changes, has nystagmus on lateral gaze Psychiatric: Patient acts appropriately throughout our interaction. Lymphatic: No cervical or submandibular lymphadenopathy Genitourinary/Anorectal: No acute findings    LABS: Results for orders placed or performed in visit on 12/02/14  POCT glucose (manual entry)  Result Value Ref Range   POC Glucose 92 70 - 99 mg/dl     EKG/XRAY:   Primary read interpreted by Dr. Everlene Farrier at [facility].Sinus with PVC,s the computer interpretation is an accurate.   ASSESSMENT/PLAN: Patient presents with an episode consistent with a TIA. He is currently neurologically intact. I discussed his situation with Dr. Jannifer Franklin who was kind enough to give him an appointment to be seen at 12 noon. I very much appreciate his help.   Gross sideeffects, risk and benefits, and alternatives of medications d/w patient. Patient is aware that all medications have potential sideeffects and we are unable to predict every sideeffect or drug-drug interaction that may occur.  Arlyss Queen MD 12/02/2014 8:17 AM

## 2014-12-02 NOTE — Telephone Encounter (Signed)
The patient had a several second event of right facial droop and right arm tingling 3 days ago. I will get the patient worked into our office today.

## 2014-12-02 NOTE — Telephone Encounter (Signed)
The daughter Richard Reed was concerned about her fathers health before he leaves for a trip with her mom. She wants Dr. Everlene Farrier to call her to discuss his health to see if her dad will be able to make this trip considering it will be a long one. He is getting test done tomorrow.  Please advise Richard Reed [phone]

## 2014-12-03 ENCOUNTER — Ambulatory Visit (INDEPENDENT_AMBULATORY_CARE_PROVIDER_SITE_OTHER): Payer: Medicare Other

## 2014-12-03 DIAGNOSIS — G451 Carotid artery syndrome (hemispheric): Secondary | ICD-10-CM

## 2014-12-03 DIAGNOSIS — I480 Paroxysmal atrial fibrillation: Secondary | ICD-10-CM

## 2014-12-04 ENCOUNTER — Other Ambulatory Visit: Payer: Self-pay | Admitting: Neurology

## 2014-12-04 ENCOUNTER — Ambulatory Visit (INDEPENDENT_AMBULATORY_CARE_PROVIDER_SITE_OTHER): Payer: Medicare Other

## 2014-12-04 DIAGNOSIS — I480 Paroxysmal atrial fibrillation: Secondary | ICD-10-CM

## 2014-12-04 DIAGNOSIS — G459 Transient cerebral ischemic attack, unspecified: Secondary | ICD-10-CM

## 2014-12-04 DIAGNOSIS — G451 Carotid artery syndrome (hemispheric): Secondary | ICD-10-CM | POA: Diagnosis not present

## 2014-12-04 NOTE — Telephone Encounter (Signed)
Do you think he can travel long distance?

## 2014-12-11 ENCOUNTER — Ambulatory Visit
Admission: RE | Admit: 2014-12-11 | Discharge: 2014-12-11 | Disposition: A | Discharge status: Home / Self Care | Payer: Medicare Other | Source: Ambulatory Visit | Attending: Neurology | Admitting: Neurology

## 2014-12-11 DIAGNOSIS — I480 Paroxysmal atrial fibrillation: Secondary | ICD-10-CM

## 2014-12-11 DIAGNOSIS — G451 Carotid artery syndrome (hemispheric): Secondary | ICD-10-CM

## 2014-12-12 ENCOUNTER — Telehealth: Payer: Self-pay | Admitting: Neurology

## 2014-12-12 NOTE — Telephone Encounter (Signed)
I called the patient. I discussed the results of the MRI. No acute stroke events noted. Some cortical atrophy. Prolonged cardiac monitor is pending. No further TIA events noted.   MRI brain results 12/12/14:  IMPRESSION: This is an abnormal MRI of the brain without contrast showed the following: 1. Moderate cortical atrophy that is most pronounced in the left mesial temporal lobes. The atrophy has progressed since CT 03/23/2011. 2. T2/flair hyperintense foci in the hemispheres are consistent with age-related chronic microvascular ischemic change. The extent is probably within the expected range. 3. There are no acute findings.

## 2014-12-16 ENCOUNTER — Telehealth: Payer: Self-pay | Admitting: Neurology

## 2014-12-16 NOTE — Telephone Encounter (Signed)
Carotid Doppler study was done, does not show significant carotid artery stenosis on either side. I discussed the results with the wife.

## 2015-01-13 ENCOUNTER — Telehealth: Payer: Self-pay

## 2015-01-13 NOTE — Telephone Encounter (Signed)
Pt daughter is calling to discuss with dr Everlene Farrier the results of the halter monitor and plans for travel if that will be ok

## 2015-01-14 NOTE — Telephone Encounter (Signed)
I do not have the results of this test. My advice would be not to travel until the results of all of his testing had been done. They need to have these results before he leaves.

## 2015-01-14 NOTE — Telephone Encounter (Signed)
Spoke with daughter she agrees with Dr. Everlene Farrier. She wants [place] to call her when the results are sent. They should have sent them to [place].

## 2015-01-14 NOTE — Telephone Encounter (Signed)
Dr. Jannifer Franklin has the results of the MRI and carotid studies and they should call and get those results from him.

## 2015-01-17 NOTE — Telephone Encounter (Signed)
Spoke with daughter, advised message from Dr. Everlene Farrier. I will call Dr. Jannifer Franklin office on Monday to see if they can fax over the heart studies.

## 2015-01-19 NOTE — Telephone Encounter (Signed)
Yes results should come from Dr. Jannifer Franklin.

## 2015-01-19 NOTE — Telephone Encounter (Signed)
Ok the only Dr. Jannifer Franklin I see is a neurologist. Is this who I should call?

## 2015-01-20 ENCOUNTER — Encounter: Payer: Self-pay | Admitting: Emergency Medicine

## 2015-01-23 ENCOUNTER — Telehealth: Payer: Self-pay | Admitting: Neurology

## 2015-01-23 NOTE — Telephone Encounter (Signed)
I called patient. The cardiac monitor study appears to be unremarkable, no atrial fibrillation. No change medical therapy at this time.

## 2015-01-26 NOTE — Telephone Encounter (Signed)
Richard Ducking, MD at 01/23/2015 7:33 AM     Status: Signed       Expand All Collapse All   I called patient. The cardiac monitor study appears to be unremarkable, no atrial fibrillation. No change medical therapy at this time.           Can he go on the trip? Daughter is concerned about this.

## 2015-01-26 NOTE — Telephone Encounter (Signed)
What trip are they planning

## 2015-01-27 NOTE — Telephone Encounter (Signed)
They want to take him to [place] on the 19th. I believe they are driving.

## 2015-01-27 NOTE — Telephone Encounter (Signed)
I think that would be fine. I would not make the trip and 1 day. Be sure they check with his other caregivers neurology and cardiology and also be sure they are okay with the trip.

## 2015-01-27 NOTE — Telephone Encounter (Signed)
Advised Karen.

## 2015-01-28 ENCOUNTER — Telehealth: Payer: Self-pay | Admitting: Neurology

## 2015-01-28 NOTE — Telephone Encounter (Signed)
Wife called wanting to know since everything came back okay if they can go on their trip that they had previously cancelled? Daughters will be driving them. Would also like to know if her husband can do skeet shooting? He enjoys this. Please call to advise [phone].

## 2015-01-29 NOTE — Telephone Encounter (Signed)
I called and talked with the wife. The workup has been unremarkable, the patient feels well, okay to travel to [place].

## 2015-02-19 ENCOUNTER — Telehealth: Payer: Self-pay

## 2015-02-19 NOTE — Telephone Encounter (Signed)
Pt is needing to talk with someone about her dads condition and him being able to travel -she states that she talked to someone last week and all labs were normal and it has to do with his return home trip  Best number [phone]

## 2015-02-20 NOTE — Telephone Encounter (Signed)
Spoke with daughter. Thought her dad could come back in one trip. I advised her from a medical standpoint he should be ok but make stops periodically for rest. Pt's daughter agreed.

## 2015-04-29 ENCOUNTER — Encounter: Payer: Self-pay | Admitting: Neurology

## 2015-04-29 ENCOUNTER — Ambulatory Visit (INDEPENDENT_AMBULATORY_CARE_PROVIDER_SITE_OTHER): Payer: Medicare Other | Admitting: Neurology

## 2015-04-29 VITALS — BP 151/92 | HR 44 | Ht 68.0 in | Wt 160.5 lb

## 2015-04-29 DIAGNOSIS — I480 Paroxysmal atrial fibrillation: Secondary | ICD-10-CM | POA: Diagnosis not present

## 2015-04-29 DIAGNOSIS — G451 Carotid artery syndrome (hemispheric): Secondary | ICD-10-CM | POA: Diagnosis not present

## 2015-04-29 DIAGNOSIS — H539 Unspecified visual disturbance: Secondary | ICD-10-CM | POA: Diagnosis not present

## 2015-04-29 DIAGNOSIS — I69359 Hemiplegia and hemiparesis following cerebral infarction affecting unspecified side: Secondary | ICD-10-CM | POA: Diagnosis not present

## 2015-04-29 DIAGNOSIS — I69398 Other sequelae of cerebral infarction: Secondary | ICD-10-CM

## 2015-04-29 HISTORY — DX: Hemiplegia and hemiparesis following cerebral infarction affecting unspecified side: I69.398

## 2015-04-29 HISTORY — DX: Hemiplegia and hemiparesis following cerebral infarction affecting unspecified side: I69.359

## 2015-04-29 MED ORDER — ROSUVASTATIN CALCIUM 10 MG PO TABS: 10.0000 mg | ORAL_TABLET | Freq: Every day | ORAL | Status: DC

## 2015-04-29 NOTE — Patient Instructions (Signed)
Stroke Prevention Some medical conditions and behaviors are associated with an increased chance of having a stroke. You may prevent a stroke by making healthy choices and managing medical conditions. HOW CAN I REDUCE MY RISK OF HAVING A STROKE?   Stay physically active. Get at least 30 minutes of activity on most or all days.  Do not smoke. It may also be helpful to avoid exposure to secondhand smoke.  Limit alcohol use. Moderate alcohol use is considered to be:  No more than 2 drinks per day for men.  No more than 1 drink per day for nonpregnant women.  Eat healthy foods. This involves:  Eating 5 or more servings of fruits and vegetables a day.  Making dietary changes that address high blood pressure (hypertension), high cholesterol, diabetes, or obesity.  Manage your cholesterol levels.  Making food choices that are high in fiber and low in saturated fat, trans fat, and cholesterol may control cholesterol levels.  Take any prescribed medicines to control cholesterol as directed by your health care provider.  Manage your diabetes.  Controlling your carbohydrate and sugar intake is recommended to manage diabetes.  Take any prescribed medicines to control diabetes as directed by your health care provider.  Control your hypertension.  Making food choices that are low in salt (sodium), saturated fat, trans fat, and cholesterol is recommended to manage hypertension.  Ask your health care provider if you need treatment to lower your blood pressure. Take any prescribed medicines to control hypertension as directed by your health care provider.  If you are 18-39 years of age, have your blood pressure checked every 3-5 years. If you are 40 years of age or older, have your blood pressure checked every year.  Maintain a healthy weight.  Reducing calorie intake and making food choices that are low in sodium, saturated fat, trans fat, and cholesterol are recommended to manage  weight.  Stop drug abuse.  Avoid taking birth control pills.  Talk to your health care provider about the risks of taking birth control pills if you are over 35 years old, smoke, get migraines, or have ever had a blood clot.  Get evaluated for sleep disorders (sleep apnea).  Talk to your health care provider about getting a sleep evaluation if you snore a lot or have excessive sleepiness.  Take medicines only as directed by your health care provider.  For some people, aspirin or blood thinners (anticoagulants) are helpful in reducing the risk of forming abnormal blood clots that can lead to stroke. If you have the irregular heart rhythm of atrial fibrillation, you should be on a blood thinner unless there is a good reason you cannot take them.  Understand all your medicine instructions.  Make sure that other conditions (such as anemia or atherosclerosis) are addressed. SEEK IMMEDIATE MEDICAL CARE IF:   You have sudden weakness or numbness of the face, arm, or leg, especially on one side of the body.  Your face or eyelid droops to one side.  You have sudden confusion.  You have trouble speaking (aphasia) or understanding.  You have sudden trouble seeing in one or both eyes.  You have sudden trouble walking.  You have dizziness.  You have a loss of balance or coordination.  You have a sudden, severe headache with no known cause.  You have new chest pain or an irregular heartbeat. Any of these symptoms may represent a serious problem that is an emergency. Do not wait to see if the symptoms will   go away. Get medical help at once. Call your local emergency services (911 in [place] Do not drive yourself to the hospital.   This information is not intended to replace advice given to you by your health care provider. Make sure you discuss any questions you have with your health care provider.   Document Released: 05/12/2004 Document Revised: 04/25/2014 Document Reviewed:  10/05/2012 Elsevier Interactive Patient Education 2016 [employer]  

## 2015-04-29 NOTE — Progress Notes (Signed)
Reason for visit: Cerebrovascular disease  Richard Reed is an 82 y.o. male  History of present illness:  Mr. Boggs is an 82 year old left-handed white male with a history of cerebrovascular disease. The patient likely sustained a small medullary stroke that has resulted in downbeat nystagmus. The patient has been seen by a neurophthalmologist in [place], [place] recently. The patient had a workup following a TIA event that was associated with right face numbness and drawing and right arm numbness. The patient has not had any recurrence of this. He has undergone a carotid Doppler study, MRI the brain, and a prolonged cardiac monitor study given the history of paroxysmal atrial fibrillation. The patient remains on low-dose aspirin. The above workup was unremarkable, without acute stroke changes seen on MRI. No evidence of atrial fibrillation was found. The patient has had a series of lipid panels done within the last 2 years, the LDL ranges from 115 to around 125. The patient is not on a cholesterol lowering agent. He returns for an evaluation. He denies any new symptoms since last seen.  Past Medical History  Diagnosis Date  . Irregular heart rate   . Kidney stones   . Hard of hearing   . CKD (chronic kidney disease)   . HTN (hypertension)   . Hiatal hernia   . Sepsis due to Escherichia coli ([place]) 03/24/11  . Dysrhythmia   . Angina   . Pneumonia 03/23/2011    "first time"  . Cancer ([place])   . Prostate cancer ([place])   . Arthritis pain   . Arthritis   . PAF (paroxysmal atrial fibrillation) ([place]) 10/29/2012  . Aortic valve insufficiency, moderate 10/29/2012  . CAD (coronary artery disease)     non obstructive  . Hyperlipidemia   . Visual disturbance 03/19/2014  . HOH (hard of hearing)   . Mitral regurgitation   . Hemiparesis and alteration of sensations as late effects of stroke ([place]) 04/29/2015    Past Surgical History  Procedure Laterality Date  . Cholecystectomy   04/15/2011    Procedure: LAPAROSCOPIC CHOLECYSTECTOMY WITH INTRAOPERATIVE CHOLANGIOGRAM;  Surgeon: Belva Crome, MD;  Location: [facility];  Service: General;  Laterality: N/A;  . Prostate surgery      approx 10 years ago, seed implant radiation  . Cardiac catheterization  09/2010    non obstructive CAD, 40-60% ostial RCA stenosis without dampening   . Cataract extraction Bilateral   . Tonsillectomy      Family History  Problem Relation Age of Onset  . Heart failure Mother   . Heart disease Mother   . Heart disease Father   . Cancer Brother     prostate    Social history:  reports that he has never smoked. He has never used smokeless tobacco. He reports that he does not drink alcohol or use illicit drugs.   No Known Allergies  Medications:  Prior to Admission medications   Medication Sig Start Date End Date Taking? Authorizing Provider  aspirin EC 81 MG tablet Take 81 mg by mouth daily.    Historical Provider, MD  Calcium 500 MG CHEW Chew by mouth daily.    Historical Provider, MD  fish oil-omega-3 fatty acids 1000 MG capsule Take 1 g by mouth daily.     Historical Provider, MD  metoprolol tartrate (LOPRESSOR) 25 MG tablet TAKE 1/2 TABLET BY MOUTH TWICE DAILY. 04/30/14   Lorretta Harp, MD  Multiple Vitamin (MULTIVITAMIN) tablet Take 1 tablet by mouth  daily.    Historical Provider, MD  tamsulosin (FLOMAX) 0.4 MG CAPS Take 0.4 mg by mouth daily.  04/17/11   Donnal Moat, MD    ROS:  Out of a complete 14 system review of symptoms, the patient complains only of the following symptoms, and all other reviewed systems are negative.  Hearing loss Dizziness  Blood pressure 151/92, pulse 44, height 5\' 8"  (1.727 m), weight 160 lb 8 oz (72.802 kg).  Physical Exam  General: The patient is alert and cooperative at the time of the examination.  Skin: No significant peripheral edema is noted.   Neurologic Exam  Mental status: The patient is alert and oriented x 3 at the time  of the examination. The patient has apparent normal recent and remote memory, with an apparently normal attention span and concentration ability.   Cranial nerves: Facial symmetry is present. Speech is normal, no aphasia or dysarthria is noted. Extraocular movements are full. Visual fields are full. Downbeat nystagmus is seen, with lateral gaze, there is some rotatory component to the nystagmus as well.  Motor: The patient has good strength in all 4 extremities.  Sensory examination: Soft touch sensation is symmetric on the face, arms, and legs.  Coordination: The patient has good finger-nose-finger and heel-to-shin bilaterally.  Gait and station: The patient has a normal gait. Tandem gait is unsteady. Romberg is negative, but is unsteady. No drift is seen.  Reflexes: Deep tendon reflexes are symmetric.   MRI brain 12/11/14:  IMPRESSION: This is an abnormal MRI of the brain without contrast showed the following: 1. Moderate cortical atrophy that is most pronounced in the left mesial temporal lobes. The atrophy has progressed since CT 03/23/2011. 2. T2/flair hyperintense foci in the hemispheres are consistent with age-related chronic microvascular ischemic change. The extent is probably within the expected range. 3. There are no acute findings.  * MRI scan images were reviewed online. I agree with the written report.    Assessment/Plan:  1. Downbeat nystagmus, probable small medullary stroke  2. Recent TIA  3. Dyslipidemia  The patient is doing relatively well at this time. He will remain on aspirin. We will add low-dose Crestor to his regimen to get the LDL under 70. The patient will follow-up in one year. He will contact me if he is having problems on the Crestor.   Jill Alexanders MD 04/29/2015 7:47 PM  [facility] [address] [place], [place] [address]  Phone [phone] Fax [phone]

## 2015-05-05 ENCOUNTER — Ambulatory Visit (INDEPENDENT_AMBULATORY_CARE_PROVIDER_SITE_OTHER): Payer: Medicare Other | Admitting: Cardiovascular Disease

## 2015-05-05 ENCOUNTER — Encounter: Payer: Self-pay | Admitting: Cardiovascular Disease

## 2015-05-05 VITALS — BP 134/84 | HR 60 | Ht 68.0 in | Wt 157.0 lb

## 2015-05-05 DIAGNOSIS — I10 Essential (primary) hypertension: Secondary | ICD-10-CM | POA: Diagnosis not present

## 2015-05-05 DIAGNOSIS — I351 Nonrheumatic aortic (valve) insufficiency: Secondary | ICD-10-CM

## 2015-05-05 DIAGNOSIS — I480 Paroxysmal atrial fibrillation: Secondary | ICD-10-CM

## 2015-05-05 DIAGNOSIS — E785 Hyperlipidemia, unspecified: Secondary | ICD-10-CM

## 2015-05-05 NOTE — Assessment & Plan Note (Signed)
History of hyperlipidemia on statin therapy followed by his PCP 

## 2015-05-05 NOTE — Patient Instructions (Signed)

## 2015-05-05 NOTE — Assessment & Plan Note (Signed)
History of mild to moderate aortic insufficiency by 2-D echo performed 11/26/12 with normal LV systolic function. He is a symptomatic from this.

## 2015-05-05 NOTE — Assessment & Plan Note (Signed)
History of paroxysmal atrial fibrillation maintaining sinus rhythm. 

## 2015-05-05 NOTE — Assessment & Plan Note (Signed)
History of hypertension blood pressure measured at 134/84. He is on metoprolol. Continue current meds at current dosing

## 2015-05-05 NOTE — Progress Notes (Signed)
05/05/2015 Richard Reed   Feb 14, 1926  [id number]  Primary Physician DAUB, Lina Sayre, MD Primary Cardiologist: Lorretta Harp MD Renae Gloss   HPI:  The patient is a very pleasant 82 year old thin-appearing married Caucasian male, father of 21, grandfather to 43 grandchildren, who I last saw 12 months ago.he is accompanied by his wife today. He was hospitalized late 2012 with E. coli bacteremia and septic shock. He did develop some arrhythmias with PAF during that hospitalization. His EF was 45% to 50% with moderate aortic insufficiency. His other problems include hypertension and hyperlipidemia. I catheterized him in June 2012, revealing normal coronary arteries, with 40% to 60% ostial right coronary artery stenosis without damping. His right heart catheterization was unremarkable. He has had an uncomplicated laparoscopic cholecystectomy performed by Dr. Chucky May, April 12, 2011. His most recent lab work performed 02/11/14 revealed a total cholesterol 171, LDL 1:15 and HDL of 39. His most recent 2-D echo performed 11/26/12 revealed normal LV systolic function, moderate aortic insufficiency and mitral regurgitation. He remains asymptomatic.   Current Outpatient Prescriptions  Medication Sig Dispense Refill  . aspirin EC 81 MG tablet Take 81 mg by mouth daily.    . Calcium 500 MG CHEW Chew by mouth daily.    . fish oil-omega-3 fatty acids 1000 MG capsule Take 1 g by mouth daily.     . metoprolol tartrate (LOPRESSOR) 25 MG tablet TAKE 1/2 TABLET BY MOUTH TWICE DAILY. 30 tablet 11  . Multiple Vitamin (MULTIVITAMIN) tablet Take 1 tablet by mouth daily.    . rosuvastatin (CRESTOR) 10 MG tablet Take 1 tablet (10 mg total) by mouth daily. 30 tablet 3  . tamsulosin (FLOMAX) 0.4 MG CAPS Take 0.4 mg by mouth daily.      No current facility-administered medications for this visit.    No Known Allergies  Social History   Social History  . Marital Status: Married    Spouse Name:  N/A  . Number of Children: 2  . Years of Education: HS   Occupational History  . Retired    Social History Main Topics  . Smoking status: Never Smoker   . Smokeless tobacco: Never Used  . Alcohol Use: No  . Drug Use: No  . Sexual Activity: Yes   Other Topics Concern  . Not on file   Social History Narrative   Medical Decision Maker:  SHIGEKI FURNO (wife) Cell. [phone] House: [phone].   Daughter: Nigel Sloop: Cell [phone]   Full Code   Patient is Left handed.   Patient drinks 1 cup of caffeine daily.   Patient is left handed.     Review of Systems: General: negative for chills, fever, night sweats or weight changes.  Cardiovascular: negative for chest pain, dyspnea on exertion, edema, orthopnea, palpitations, paroxysmal nocturnal dyspnea or shortness of breath Dermatological: negative for rash Respiratory: negative for cough or wheezing Urologic: negative for hematuria Abdominal: negative for nausea, vomiting, diarrhea, bright red blood per rectum, melena, or hematemesis Neurologic: negative for visual changes, syncope, or dizziness All other systems reviewed and are otherwise negative except as noted above.    Blood pressure 134/84, pulse 60, height 5\' 8"  (1.727 m), weight 157 lb (71.215 kg).  General appearance: alert and no distress Neck: no adenopathy, no carotid bruit, no JVD, supple, symmetrical, trachea midline and thyroid not enlarged, symmetric, no tenderness/mass/nodules Lungs: clear to auscultation bilaterally Heart: regular rate and rhythm, S1, S2 normal, no murmur, click, rub or gallop  Extremities: extremities normal, atraumatic, no cyanosis or edema  EKG not performed today  ASSESSMENT AND PLAN:   PAF (paroxysmal atrial fibrillation) History of paroxysmal atrial fibrillation maintaining sinus rhythm  Hyperlipidemia History of hyperlipidemia on statin therapy followed by his PCP  Essential hypertension History of hypertension  blood pressure measured at 134/84. He is on metoprolol. Continue current meds at current dosing  Aortic valve insufficiency, moderate History of mild to moderate aortic insufficiency by 2-D echo performed 11/26/12 with normal LV systolic function. He is a symptomatic from this.      Lorretta Harp MD FACP,FACC,FAHA, [facility] 05/05/2015 4:50 PM

## 2015-05-18 ENCOUNTER — Other Ambulatory Visit: Payer: Self-pay | Admitting: Cardiovascular Disease

## 2015-05-18 NOTE — Telephone Encounter (Signed)
REFILL 

## 2015-07-07 ENCOUNTER — Ambulatory Visit (INDEPENDENT_AMBULATORY_CARE_PROVIDER_SITE_OTHER): Payer: Medicare Other

## 2015-07-07 ENCOUNTER — Telehealth: Payer: Self-pay | Admitting: Family Medicine

## 2015-07-07 DIAGNOSIS — Z23 Encounter for immunization: Secondary | ICD-10-CM | POA: Diagnosis not present

## 2015-07-07 NOTE — Telephone Encounter (Signed)
Called patient to see if they have had there flu shot.  If they have please document it.  If they have not please ask them to come in.  If they decline please document in health maintenance.

## 2015-07-07 NOTE — Telephone Encounter (Signed)
Patient returned call about flu shot and he will come into [facility] and get the flu shot.

## 2015-08-20 DIAGNOSIS — Z8546 Personal history of malignant neoplasm of prostate: Secondary | ICD-10-CM | POA: Diagnosis not present

## 2015-08-20 DIAGNOSIS — R3914 Feeling of incomplete bladder emptying: Secondary | ICD-10-CM | POA: Diagnosis not present

## 2015-08-20 DIAGNOSIS — N200 Calculus of kidney: Secondary | ICD-10-CM | POA: Diagnosis not present

## 2015-08-20 DIAGNOSIS — Z0000 Encounter for general adult medical examination without abnormal findings: Secondary | ICD-10-CM | POA: Diagnosis not present

## 2015-12-07 ENCOUNTER — Ambulatory Visit (INDEPENDENT_AMBULATORY_CARE_PROVIDER_SITE_OTHER): Payer: Medicare Other | Admitting: Emergency Medicine

## 2015-12-07 DIAGNOSIS — Z23 Encounter for immunization: Secondary | ICD-10-CM

## 2015-12-07 NOTE — Progress Notes (Signed)
Patient here for flu shot only.

## 2016-01-12 ENCOUNTER — Telehealth: Payer: Self-pay | Admitting: Neurology

## 2016-01-12 DIAGNOSIS — Z961 Presence of intraocular lens: Secondary | ICD-10-CM | POA: Diagnosis not present

## 2016-01-12 DIAGNOSIS — H26492 Other secondary cataract, left eye: Secondary | ICD-10-CM | POA: Diagnosis not present

## 2016-01-12 DIAGNOSIS — H5509 Other forms of nystagmus: Secondary | ICD-10-CM | POA: Diagnosis not present

## 2016-01-12 DIAGNOSIS — H353132 Nonexudative age-related macular degeneration, bilateral, intermediate dry stage: Secondary | ICD-10-CM | POA: Diagnosis not present

## 2016-01-12 NOTE — Telephone Encounter (Signed)
Events noted

## 2016-01-12 NOTE — Telephone Encounter (Signed)
Wife called to advise of PCP change from Dr. Everlene Farrier (Dr. Everlene Farrier retiring) to Dr. Lattie Haw [facility] on [address].

## 2016-01-25 ENCOUNTER — Telehealth: Payer: Self-pay | Admitting: Neurology

## 2016-01-25 NOTE — Telephone Encounter (Signed)
Spoke to pt's wife. Says that PCP recommended that pt see Dr. Jaynee Eagles for downbeat nystagmus but did not realize that she was in the same office as Dr. Jannifer Franklin. Since symptoms have not changed or worsened, agreed to keep f/u appt previously scheduled in January.

## 2016-02-17 DIAGNOSIS — K449 Diaphragmatic hernia without obstruction or gangrene: Secondary | ICD-10-CM | POA: Diagnosis not present

## 2016-02-17 DIAGNOSIS — I361 Nonrheumatic tricuspid (valve) insufficiency: Secondary | ICD-10-CM | POA: Diagnosis not present

## 2016-02-17 DIAGNOSIS — J90 Pleural effusion, not elsewhere classified: Secondary | ICD-10-CM | POA: Diagnosis not present

## 2016-02-17 DIAGNOSIS — I77810 Thoracic aortic ectasia: Secondary | ICD-10-CM | POA: Diagnosis not present

## 2016-02-17 DIAGNOSIS — R200 Anesthesia of skin: Secondary | ICD-10-CM | POA: Diagnosis not present

## 2016-02-17 DIAGNOSIS — J439 Emphysema, unspecified: Secondary | ICD-10-CM | POA: Diagnosis not present

## 2016-02-17 DIAGNOSIS — R202 Paresthesia of skin: Secondary | ICD-10-CM | POA: Diagnosis not present

## 2016-02-17 DIAGNOSIS — J9811 Atelectasis: Secondary | ICD-10-CM | POA: Diagnosis not present

## 2016-02-17 DIAGNOSIS — I517 Cardiomegaly: Secondary | ICD-10-CM | POA: Diagnosis not present

## 2016-02-17 DIAGNOSIS — Z77090 Contact with and (suspected) exposure to asbestos: Secondary | ICD-10-CM | POA: Diagnosis not present

## 2016-02-17 DIAGNOSIS — Z79899 Other long term (current) drug therapy: Secondary | ICD-10-CM | POA: Diagnosis not present

## 2016-02-17 DIAGNOSIS — R918 Other nonspecific abnormal finding of lung field: Secondary | ICD-10-CM | POA: Diagnosis not present

## 2016-02-17 DIAGNOSIS — R0600 Dyspnea, unspecified: Secondary | ICD-10-CM | POA: Diagnosis not present

## 2016-02-17 DIAGNOSIS — Z9049 Acquired absence of other specified parts of digestive tract: Secondary | ICD-10-CM | POA: Diagnosis not present

## 2016-02-17 DIAGNOSIS — I77819 Aortic ectasia, unspecified site: Secondary | ICD-10-CM | POA: Diagnosis not present

## 2016-02-17 DIAGNOSIS — Z8546 Personal history of malignant neoplasm of prostate: Secondary | ICD-10-CM | POA: Diagnosis not present

## 2016-02-17 DIAGNOSIS — N400 Enlarged prostate without lower urinary tract symptoms: Secondary | ICD-10-CM | POA: Diagnosis not present

## 2016-02-17 DIAGNOSIS — I2721 Secondary pulmonary arterial hypertension: Secondary | ICD-10-CM | POA: Diagnosis not present

## 2016-02-17 DIAGNOSIS — I272 Pulmonary hypertension, unspecified: Secondary | ICD-10-CM | POA: Diagnosis not present

## 2016-02-17 DIAGNOSIS — G9389 Other specified disorders of brain: Secondary | ICD-10-CM | POA: Diagnosis not present

## 2016-02-17 DIAGNOSIS — R0602 Shortness of breath: Secondary | ICD-10-CM | POA: Diagnosis not present

## 2016-02-17 DIAGNOSIS — I2510 Atherosclerotic heart disease of native coronary artery without angina pectoris: Secondary | ICD-10-CM | POA: Diagnosis not present

## 2016-02-17 DIAGNOSIS — R0689 Other abnormalities of breathing: Secondary | ICD-10-CM | POA: Diagnosis not present

## 2016-02-17 DIAGNOSIS — R4781 Slurred speech: Secondary | ICD-10-CM | POA: Diagnosis not present

## 2016-02-17 DIAGNOSIS — J449 Chronic obstructive pulmonary disease, unspecified: Secondary | ICD-10-CM | POA: Diagnosis not present

## 2016-02-17 DIAGNOSIS — I110 Hypertensive heart disease with heart failure: Secondary | ICD-10-CM | POA: Diagnosis not present

## 2016-02-17 DIAGNOSIS — I10 Essential (primary) hypertension: Secondary | ICD-10-CM | POA: Diagnosis not present

## 2016-02-17 DIAGNOSIS — I771 Stricture of artery: Secondary | ICD-10-CM | POA: Diagnosis not present

## 2016-02-17 DIAGNOSIS — I509 Heart failure, unspecified: Secondary | ICD-10-CM | POA: Diagnosis not present

## 2016-02-17 DIAGNOSIS — I340 Nonrheumatic mitral (valve) insufficiency: Secondary | ICD-10-CM | POA: Diagnosis not present

## 2016-02-17 DIAGNOSIS — J929 Pleural plaque without asbestos: Secondary | ICD-10-CM | POA: Diagnosis not present

## 2016-02-18 DIAGNOSIS — I361 Nonrheumatic tricuspid (valve) insufficiency: Secondary | ICD-10-CM | POA: Diagnosis not present

## 2016-02-18 DIAGNOSIS — R918 Other nonspecific abnormal finding of lung field: Secondary | ICD-10-CM | POA: Diagnosis not present

## 2016-02-18 DIAGNOSIS — I517 Cardiomegaly: Secondary | ICD-10-CM | POA: Diagnosis not present

## 2016-02-18 DIAGNOSIS — I340 Nonrheumatic mitral (valve) insufficiency: Secondary | ICD-10-CM | POA: Diagnosis not present

## 2016-02-18 DIAGNOSIS — R0602 Shortness of breath: Secondary | ICD-10-CM | POA: Diagnosis not present

## 2016-02-18 DIAGNOSIS — J449 Chronic obstructive pulmonary disease, unspecified: Secondary | ICD-10-CM | POA: Diagnosis not present

## 2016-02-18 DIAGNOSIS — I509 Heart failure, unspecified: Secondary | ICD-10-CM | POA: Diagnosis not present

## 2016-02-18 DIAGNOSIS — I272 Pulmonary hypertension, unspecified: Secondary | ICD-10-CM | POA: Diagnosis not present

## 2016-02-19 DIAGNOSIS — R918 Other nonspecific abnormal finding of lung field: Secondary | ICD-10-CM | POA: Diagnosis not present

## 2016-02-19 DIAGNOSIS — I509 Heart failure, unspecified: Secondary | ICD-10-CM | POA: Diagnosis not present

## 2016-02-19 DIAGNOSIS — R0602 Shortness of breath: Secondary | ICD-10-CM | POA: Diagnosis not present

## 2016-02-19 DIAGNOSIS — R0689 Other abnormalities of breathing: Secondary | ICD-10-CM | POA: Diagnosis not present

## 2016-02-19 DIAGNOSIS — J449 Chronic obstructive pulmonary disease, unspecified: Secondary | ICD-10-CM | POA: Diagnosis not present

## 2016-02-20 DIAGNOSIS — I517 Cardiomegaly: Secondary | ICD-10-CM | POA: Diagnosis not present

## 2016-02-20 DIAGNOSIS — J929 Pleural plaque without asbestos: Secondary | ICD-10-CM | POA: Diagnosis not present

## 2016-02-20 DIAGNOSIS — Z79899 Other long term (current) drug therapy: Secondary | ICD-10-CM | POA: Diagnosis not present

## 2016-02-20 DIAGNOSIS — G9389 Other specified disorders of brain: Secondary | ICD-10-CM | POA: Diagnosis not present

## 2016-02-20 DIAGNOSIS — R4781 Slurred speech: Secondary | ICD-10-CM | POA: Diagnosis not present

## 2016-02-20 DIAGNOSIS — R0602 Shortness of breath: Secondary | ICD-10-CM | POA: Diagnosis not present

## 2016-02-20 DIAGNOSIS — R200 Anesthesia of skin: Secondary | ICD-10-CM | POA: Diagnosis not present

## 2016-02-20 DIAGNOSIS — I771 Stricture of artery: Secondary | ICD-10-CM | POA: Diagnosis not present

## 2016-02-20 DIAGNOSIS — I10 Essential (primary) hypertension: Secondary | ICD-10-CM | POA: Diagnosis not present

## 2016-02-20 DIAGNOSIS — R202 Paresthesia of skin: Secondary | ICD-10-CM | POA: Diagnosis not present

## 2016-02-23 ENCOUNTER — Telehealth: Payer: Self-pay | Admitting: Physician Assistant

## 2016-02-23 ENCOUNTER — Telehealth: Payer: Self-pay | Admitting: Cardiovascular Disease

## 2016-02-23 ENCOUNTER — Encounter: Payer: Self-pay | Admitting: Physician Assistant

## 2016-02-23 NOTE — Telephone Encounter (Signed)
Received records from [facility] for appointment on 02/29/16 with Rosaria Ferries, PA.  Records given to [employer] (medical records) for Richard Reed's schedule on 02/29/16. lp

## 2016-02-23 NOTE — Telephone Encounter (Signed)
Spoke to wife, listed DPR. She notes no concerns, pt doing well after a 2 day hospitalization. They are returning to [place] later this week. I have advised that if any new concerns or questions to call. Daughter will be faxing [place] paperwork from hospitalization. Appt confirmed for Monday. She voiced thanks and understanding.

## 2016-02-23 NOTE — Telephone Encounter (Signed)
New Message  Pt daughter Arrie Aran call requesting to speak with RN. Pt daughter states pt is visiting her out of town and had to be hospitalized. Pt was diagnosed with CHF and was instructed to f/u with cardiologist. Pt daughter states she will fax over records from pt hospital visit.pt was scheduled appt for 11/13 with Rhonda Barrett. Please call back to discuss if needed.

## 2016-02-28 ENCOUNTER — Encounter (HOSPITAL_COMMUNITY): Payer: Self-pay | Admitting: Emergency Medicine

## 2016-02-28 ENCOUNTER — Observation Stay (HOSPITAL_COMMUNITY): Payer: Medicare Other

## 2016-02-28 ENCOUNTER — Emergency Department (HOSPITAL_COMMUNITY): Payer: Medicare Other

## 2016-02-28 ENCOUNTER — Inpatient Hospital Stay (HOSPITAL_COMMUNITY)
Admission: EM | Admit: 2016-02-28 | Discharge: 2016-03-01 | DRG: 068 | Disposition: A | Discharge status: Home / Self Care | Payer: Medicare Other | Attending: Internal Medicine | Admitting: Internal Medicine

## 2016-02-28 DIAGNOSIS — I651 Occlusion and stenosis of basilar artery: Principal | ICD-10-CM | POA: Diagnosis present

## 2016-02-28 DIAGNOSIS — H9190 Unspecified hearing loss, unspecified ear: Secondary | ICD-10-CM | POA: Diagnosis present

## 2016-02-28 DIAGNOSIS — R55 Syncope and collapse: Secondary | ICD-10-CM

## 2016-02-28 DIAGNOSIS — I5042 Chronic combined systolic (congestive) and diastolic (congestive) heart failure: Secondary | ICD-10-CM | POA: Diagnosis not present

## 2016-02-28 DIAGNOSIS — N189 Chronic kidney disease, unspecified: Secondary | ICD-10-CM | POA: Diagnosis not present

## 2016-02-28 DIAGNOSIS — J9811 Atelectasis: Secondary | ICD-10-CM | POA: Diagnosis not present

## 2016-02-28 DIAGNOSIS — Z9842 Cataract extraction status, left eye: Secondary | ICD-10-CM

## 2016-02-28 DIAGNOSIS — Z8546 Personal history of malignant neoplasm of prostate: Secondary | ICD-10-CM

## 2016-02-28 DIAGNOSIS — R4020 Unspecified coma: Secondary | ICD-10-CM

## 2016-02-28 DIAGNOSIS — I130 Hypertensive heart and chronic kidney disease with heart failure and stage 1 through stage 4 chronic kidney disease, or unspecified chronic kidney disease: Secondary | ICD-10-CM | POA: Diagnosis not present

## 2016-02-28 DIAGNOSIS — R002 Palpitations: Secondary | ICD-10-CM | POA: Diagnosis not present

## 2016-02-28 DIAGNOSIS — Z8042 Family history of malignant neoplasm of prostate: Secondary | ICD-10-CM

## 2016-02-28 DIAGNOSIS — M1990 Unspecified osteoarthritis, unspecified site: Secondary | ICD-10-CM | POA: Diagnosis present

## 2016-02-28 DIAGNOSIS — I671 Cerebral aneurysm, nonruptured: Secondary | ICD-10-CM | POA: Diagnosis present

## 2016-02-28 DIAGNOSIS — I69359 Hemiplegia and hemiparesis following cerebral infarction affecting unspecified side: Secondary | ICD-10-CM | POA: Diagnosis not present

## 2016-02-28 DIAGNOSIS — F0280 Dementia in other diseases classified elsewhere without behavioral disturbance: Secondary | ICD-10-CM | POA: Diagnosis present

## 2016-02-28 DIAGNOSIS — Z8249 Family history of ischemic heart disease and other diseases of the circulatory system: Secondary | ICD-10-CM

## 2016-02-28 DIAGNOSIS — E785 Hyperlipidemia, unspecified: Secondary | ICD-10-CM | POA: Diagnosis present

## 2016-02-28 DIAGNOSIS — Z9841 Cataract extraction status, right eye: Secondary | ICD-10-CM

## 2016-02-28 DIAGNOSIS — G3183 Dementia with Lewy bodies: Secondary | ICD-10-CM | POA: Diagnosis present

## 2016-02-28 DIAGNOSIS — N289 Disorder of kidney and ureter, unspecified: Secondary | ICD-10-CM | POA: Diagnosis not present

## 2016-02-28 DIAGNOSIS — D696 Thrombocytopenia, unspecified: Secondary | ICD-10-CM | POA: Diagnosis not present

## 2016-02-28 DIAGNOSIS — I480 Paroxysmal atrial fibrillation: Secondary | ICD-10-CM | POA: Diagnosis not present

## 2016-02-28 DIAGNOSIS — N390 Urinary tract infection, site not specified: Secondary | ICD-10-CM | POA: Diagnosis present

## 2016-02-28 DIAGNOSIS — E860 Dehydration: Secondary | ICD-10-CM | POA: Diagnosis present

## 2016-02-28 DIAGNOSIS — R001 Bradycardia, unspecified: Secondary | ICD-10-CM | POA: Diagnosis present

## 2016-02-28 DIAGNOSIS — N179 Acute kidney failure, unspecified: Secondary | ICD-10-CM | POA: Diagnosis not present

## 2016-02-28 DIAGNOSIS — Z7982 Long term (current) use of aspirin: Secondary | ICD-10-CM

## 2016-02-28 DIAGNOSIS — J449 Chronic obstructive pulmonary disease, unspecified: Secondary | ICD-10-CM | POA: Diagnosis present

## 2016-02-28 DIAGNOSIS — I2510 Atherosclerotic heart disease of native coronary artery without angina pectoris: Secondary | ICD-10-CM | POA: Diagnosis present

## 2016-02-28 HISTORY — DX: Transient cerebral ischemic attack, unspecified: G45.9

## 2016-02-28 LAB — TSH: TSH: 3.567 u[IU]/mL (ref 0.350–4.500)

## 2016-02-28 LAB — CBC WITH DIFFERENTIAL/PLATELET
BASOS PCT: 0 %
Basophils Absolute: 0 10*3/uL (ref 0.0–0.1)
Eosinophils Absolute: 0 10*3/uL (ref 0.0–0.7)
Eosinophils Relative: 1 %
HEMATOCRIT: 39.3 % (ref 39.0–52.0)
HEMOGLOBIN: 13.1 g/dL (ref 13.0–17.0)
Lymphocytes Relative: 18 %
Lymphs Abs: 0.9 10*3/uL (ref 0.7–4.0)
MCH: 29.6 pg (ref 26.0–34.0)
MCHC: 33.3 g/dL (ref 30.0–36.0)
MCV: 88.7 fL (ref 78.0–100.0)
MONOS PCT: 6 %
Monocytes Absolute: 0.3 10*3/uL (ref 0.1–1.0)
NEUTROS ABS: 3.6 10*3/uL (ref 1.7–7.7)
NEUTROS PCT: 75 %
Platelets: 131 10*3/uL — ABNORMAL LOW (ref 150–400)
RBC: 4.43 MIL/uL (ref 4.22–5.81)
RDW: 14.8 % (ref 11.5–15.5)
WBC: 4.8 10*3/uL (ref 4.0–10.5)

## 2016-02-28 LAB — COMPREHENSIVE METABOLIC PANEL
ALBUMIN: 3.4 g/dL — AB (ref 3.5–5.0)
ALT: 12 U/L — ABNORMAL LOW (ref 17–63)
ANION GAP: 8 (ref 5–15)
AST: 16 U/L (ref 15–41)
Alkaline Phosphatase: 58 U/L (ref 38–126)
BUN: 59 mg/dL — ABNORMAL HIGH (ref 6–20)
CO2: 24 mmol/L (ref 22–32)
Calcium: 8.8 mg/dL — ABNORMAL LOW (ref 8.9–10.3)
Chloride: 104 mmol/L (ref 101–111)
Creatinine, Ser: 2.04 mg/dL — ABNORMAL HIGH (ref 0.61–1.24)
GFR calc Af Amer: 32 mL/min — ABNORMAL LOW (ref >60)
GFR calc non Af Amer: 27 mL/min — ABNORMAL LOW (ref >60)
GLUCOSE: 94 mg/dL (ref 65–99)
POTASSIUM: 5 mmol/L (ref 3.5–5.1)
SODIUM: 136 mmol/L (ref 135–145)
TOTAL PROTEIN: 5.9 g/dL — AB (ref 6.5–8.1)
Total Bilirubin: 1.2 mg/dL (ref 0.3–1.2)

## 2016-02-28 LAB — URINE MICROSCOPIC-ADD ON

## 2016-02-28 LAB — URINALYSIS, ROUTINE W REFLEX MICROSCOPIC
Bilirubin Urine: NEGATIVE
Glucose, UA: NEGATIVE mg/dL
Ketones, ur: NEGATIVE mg/dL
NITRITE: NEGATIVE
Protein, ur: NEGATIVE mg/dL
SPECIFIC GRAVITY, URINE: 1.009 (ref 1.005–1.030)
pH: 5 (ref 5.0–8.0)

## 2016-02-28 LAB — PHOSPHORUS: PHOSPHORUS: 4.5 mg/dL (ref 2.5–4.6)

## 2016-02-28 LAB — TROPONIN I: Troponin I: <0.03 ng/mL (ref <0.03)

## 2016-02-28 LAB — CREATININE, URINE, RANDOM: CREATININE, URINE: 54.08 mg/dL

## 2016-02-28 LAB — MAGNESIUM: MAGNESIUM: 2.4 mg/dL (ref 1.7–2.4)

## 2016-02-28 MED ORDER — FUROSEMIDE 20 MG PO TABS
40.0000 mg | ORAL_TABLET | Freq: Every day | ORAL | Status: DC
  Administered 2016-02-29: 40 mg via ORAL
  Filled 2016-02-28 (×2): qty 2

## 2016-02-28 MED ORDER — SODIUM CHLORIDE 0.9% FLUSH: 3.0000 mL | Freq: Two times a day (BID) | INTRAVENOUS | Status: DC

## 2016-02-28 MED ORDER — MOMETASONE FURO-FORMOTEROL FUM 200-5 MCG/ACT IN AERO
2.0000 | INHALATION_SPRAY | Freq: Two times a day (BID) | RESPIRATORY_TRACT | Status: DC
  Administered 2016-02-29 – 2016-03-01 (×2): 2 via RESPIRATORY_TRACT
  Filled 2016-02-28: qty 8.8

## 2016-02-28 MED ORDER — KCL IN DEXTROSE-NACL 20-5-0.45 MEQ/L-%-% IV SOLN
INTRAVENOUS | Status: DC
  Filled 2016-02-28: qty 1000

## 2016-02-28 MED ORDER — ENOXAPARIN SODIUM 30 MG/0.3ML ~~LOC~~ SOLN
30.0000 mg | SUBCUTANEOUS | Status: DC
  Filled 2016-02-28: qty 0.3

## 2016-02-28 MED ORDER — HYDRALAZINE HCL 20 MG/ML IJ SOLN: 10.0000 mg | Freq: Three times a day (TID) | INTRAMUSCULAR | Status: DC | PRN

## 2016-02-28 MED ORDER — ONDANSETRON HCL 4 MG/2ML IJ SOLN: 4.0000 mg | Freq: Four times a day (QID) | INTRAMUSCULAR | Status: DC | PRN

## 2016-02-28 MED ORDER — ALBUTEROL SULFATE (2.5 MG/3ML) 0.083% IN NEBU: 2.5000 mg | INHALATION_SOLUTION | RESPIRATORY_TRACT | Status: DC | PRN

## 2016-02-28 MED ORDER — SODIUM CHLORIDE 0.9 % IV BOLUS (SEPSIS)
500.0000 mL | Freq: Once | INTRAVENOUS | Status: AC
  Administered 2016-02-28: 500 mL via INTRAVENOUS

## 2016-02-28 MED ORDER — ONDANSETRON HCL 4 MG PO TABS: 4.0000 mg | ORAL_TABLET | Freq: Four times a day (QID) | ORAL | Status: DC | PRN

## 2016-02-28 MED ORDER — SPIRONOLACTONE 25 MG PO TABS
12.5000 mg | ORAL_TABLET | Freq: Every day | ORAL | Status: DC
  Administered 2016-02-29: 12.5 mg via ORAL
  Filled 2016-02-28: qty 1

## 2016-02-28 MED ORDER — ASPIRIN EC 81 MG PO TBEC: 81.0000 mg | DELAYED_RELEASE_TABLET | Freq: Every day | ORAL | Status: DC

## 2016-02-28 MED ORDER — ACETAMINOPHEN 325 MG PO TABS: 650.0000 mg | ORAL_TABLET | Freq: Four times a day (QID) | ORAL | Status: DC | PRN

## 2016-02-28 MED ORDER — DEXTROSE 5 % IV SOLN
1.0000 g | INTRAVENOUS | Status: DC
  Administered 2016-02-28 – 2016-02-29 (×2): 1 g via INTRAVENOUS
  Filled 2016-02-28 (×3): qty 10

## 2016-02-28 MED ORDER — SODIUM CHLORIDE 0.9 % IV SOLN
INTRAVENOUS | Status: DC
  Administered 2016-02-28: 100 mL/h via INTRAVENOUS
  Administered 2016-02-29 – 2016-03-01 (×2): via INTRAVENOUS

## 2016-02-28 MED ORDER — ASPIRIN EC 325 MG PO TBEC
325.0000 mg | DELAYED_RELEASE_TABLET | Freq: Every day | ORAL | Status: DC
  Administered 2016-02-28 – 2016-03-01 (×3): 325 mg via ORAL
  Filled 2016-02-28 (×3): qty 1

## 2016-02-28 MED ORDER — ACETAMINOPHEN 650 MG RE SUPP: 650.0000 mg | Freq: Four times a day (QID) | RECTAL | Status: DC | PRN

## 2016-02-28 MED ORDER — LISINOPRIL 10 MG PO TABS
10.0000 mg | ORAL_TABLET | Freq: Every day | ORAL | Status: DC
  Administered 2016-02-29: 10 mg via ORAL
  Filled 2016-02-28: qty 1

## 2016-02-28 NOTE — ED Provider Notes (Signed)
[facility] DEPT Provider Note   CSN: [id number] Arrival date & time: 02/28/16  1216     History   Chief Complaint Chief Complaint  Patient presents with  . Loss of Consciousness    HPI Richard Reed is a 82 y.o. male.  HPI Patient presents with syncope. Was at church and he became unresponsive. Reportedly was hypotensive. Heart rate was in the 40s. Heart rate here has been in the 50s and sinus. Blood pressure improved and had small fluid bolus. Alert and oriented. Very hard of hearing. Denies chest pain. States he's been doing well otherwise. States she did eat breakfast this morning. No abdominal pain. Does have previous history of atrial fibrillation. Did have a rather recent admission to hospital in [place] for CHF and COPD. Patient's wife has the records.   Past Medical History:  Diagnosis Date  . Angina   . Aortic valve insufficiency, moderate 10/29/2012  . Arthritis   . Arthritis pain   . CAD (coronary artery disease)    non obstructive  . Cancer ([place])   . CKD (chronic kidney disease)   . Dysrhythmia   . Hard of hearing   . Hemiparesis and alteration of sensations as late effects of stroke ([place]) 04/29/2015  . Hiatal hernia   . HOH (hard of hearing)   . HTN (hypertension)   . Hyperlipidemia   . Irregular heart rate   . Kidney stones   . Mitral regurgitation   . PAF (paroxysmal atrial fibrillation) ([place]) 10/29/2012  . Pneumonia 03/23/2011   "first time"  . Prostate cancer ([place])   . Sepsis due to Escherichia coli ([place]) 03/24/11  . TIA (transient ischemic attack)   . Visual disturbance 03/19/2014    Patient Active Problem List   Diagnosis Date Noted  . Hemiparesis and alteration of sensations as late effects of stroke ([place]) 04/29/2015  . TIA (transient ischemic attack) 12/02/2014  . Visual disturbance 03/19/2014  . Diplopia 03/19/2014  . Essential hypertension 04/30/2013  . Hyperlipidemia 04/30/2013  . PAF (paroxysmal atrial fibrillation) ([place]) 10/29/2012    . Aortic valve insufficiency, moderate 10/29/2012  . Heme positive stool 07/17/2012  . Postop check 05/09/2011  . Biliary colic [id number]  . Prostate CA ([place]) 04/13/2011  . Acute respiratory failure ([place]) 03/24/2011  . Thrombocytopenia ([place]) 03/24/2011  . Encephalopathy 03/24/2011  . Metabolic acidosis [id number]  . Gastroenteritis 03/23/2011  . Dehydration 03/23/2011  . Acute on chronic renal failure ([place]) 03/23/2011  . Tachycardia 03/23/2011  . Hypotension 03/23/2011  . Hard of hearing 03/23/2011  . Chronic renal insufficiency, stage III (moderate) 03/23/2011  . Pulmonary edema 03/23/2011    Past Surgical History:  Procedure Laterality Date  . CARDIAC CATHETERIZATION  09/2010   non obstructive CAD, 40-60% ostial RCA stenosis without dampening   . CATARACT EXTRACTION Bilateral   . CHOLECYSTECTOMY  04/15/2011   Procedure: LAPAROSCOPIC CHOLECYSTECTOMY WITH INTRAOPERATIVE CHOLANGIOGRAM;  Surgeon: Belva Crome, MD;  Location: [facility];  Service: General;  Laterality: N/A;  . PROSTATE SURGERY     approx 10 years ago, seed implant radiation  . TONSILLECTOMY      OB History    No data available       Home Medications    Prior to Admission medications   Medication Sig Start Date End Date Taking? Authorizing Provider  aspirin EC 81 MG tablet Take 81 mg by mouth daily.   Yes Historical Provider, MD  Calcium 500 MG CHEW Chew by mouth daily.  Yes Historical Provider, MD  carvedilol (COREG) 12.5 MG tablet Take 12.5 mg by mouth 2 (two) times daily. 02/19/16  Yes Historical Provider, MD  cyanocobalamin 500 MCG tablet Take 500 mcg by mouth daily.   Yes Historical Provider, MD  DULERA 200-5 MCG/ACT AERO Take 2 puffs by mouth 2 (two) times daily. 02/19/16  Yes Historical Provider, MD  fish oil-omega-3 fatty acids 1000 MG capsule Take 1 g by mouth daily.    Yes Historical Provider, MD  furosemide (LASIX) 20 MG tablet Take 40 mg by mouth daily. 02/19/16  Yes Historical Provider, MD   lisinopril (PRINIVIL,ZESTRIL) 5 MG tablet Take 10 mg by mouth daily. 02/19/16  Yes Historical Provider, MD  Multiple Vitamin (MULTIVITAMIN) tablet Take 1 tablet by mouth daily.   Yes Historical Provider, MD  spironolactone (ALDACTONE) 25 MG tablet Take 12.5 mg by mouth daily. 02/19/16  Yes Historical Provider, MD  metoprolol tartrate (LOPRESSOR) 25 MG tablet TAKE ONE-HALF TABLET BY MOUTH TWO TIMES A DAY Patient not taking: Reported on 02/28/2016 05/18/15   Lorretta Harp, MD  rosuvastatin (CRESTOR) 10 MG tablet Take 1 tablet (10 mg total) by mouth daily. Patient not taking: Reported on 02/28/2016 04/29/15   Kathrynn Ducking, MD    Family History Family History  Problem Relation Age of Onset  . Heart failure Mother   . Heart disease Mother   . Heart disease Father   . Cancer Brother     prostate    Social History Social History  Substance Use Topics  . Smoking status: Never Smoker  . Smokeless tobacco: Never Used  . Alcohol use No     Allergies   Patient has no known allergies.   Review of Systems Review of Systems  Constitutional: Negative for appetite change.  HENT: Negative for congestion.   Respiratory: Negative for chest tightness and shortness of breath.   Cardiovascular: Negative for chest pain.  Gastrointestinal: Negative for abdominal pain.  Musculoskeletal: Negative for back pain.  Neurological: Positive for syncope.  Psychiatric/Behavioral: Negative for confusion.     Physical Exam Updated Vital Signs BP 129/65   Pulse (!) 125   Temp 97.5 F (36.4 C) (Oral)   Resp 17   SpO2 100%   Physical Exam  Constitutional: He appears well-developed.  HENT:  Head: Atraumatic.  Patient is very hard of hearing.  Neck: No JVD present.  Cardiovascular:  Murmur heard. Regular bradycardia but does have frequent PVCs and some mild pauses. Systolic murmur.  Pulmonary/Chest: Effort normal.  Abdominal: Soft. There is no tenderness.  Musculoskeletal: He exhibits no  edema.  Neurological: He is alert.  Skin: Skin is warm.  Psychiatric: He has a normal mood and affect.     ED Treatments / Results  Labs (all labs ordered are listed, but only abnormal results are displayed) Labs Reviewed  COMPREHENSIVE METABOLIC PANEL - Abnormal; Notable for the following:       Result Value   BUN 59 (*)    Creatinine, Ser 2.04 (*)    Calcium 8.8 (*)    Total Protein 5.9 (*)    Albumin 3.4 (*)    ALT 12 (*)    GFR calc non Af Amer 27 (*)    GFR calc Af Amer 32 (*)    All other components within normal limits  CBC WITH DIFFERENTIAL/PLATELET - Abnormal; Notable for the following:    Platelets 131 (*)    All other components within normal limits  TROPONIN I  TSH  URINALYSIS, ROUTINE W REFLEX MICROSCOPIC (NOT AT [facility])    EKG  EKG Interpretation  Date/Time:  Sunday February 28 2016 12:29:40 EST Ventricular Rate:  52 PR Interval:    QRS Duration: 115 QT Interval:  477 QTC Calculation: 444 R Axis:   -22 Text Interpretation:  Sinus rhythm Ventricular premature complex Nonspecific intraventricular conduction delay Lateral infarct, age indeterminate Confirmed by Alvino Chapel  MD, Rivaan Kendall [phone]) on 02/28/2016 12:31:41 PM       Radiology Dg Chest Portable 1 View  Result Date: 02/28/2016 CLINICAL DATA:  Syncopal episode with loss of consciousness. EXAM: PORTABLE CHEST 1 VIEW COMPARISON:  03/28/2011 FINDINGS: Stable top-normal heart size and ectasia of the thoracic aorta. Bibasilar scarring and atelectasis present. Calcification in the left hemithorax likely relates to calcify pleural plaque. There is additional calcified pleural plaque along the right diaphragmatic surface. There is no evidence of pulmonary edema, consolidation, pneumothorax, nodule or pleural fluid. The visualized skeletal structures are unremarkable. IMPRESSION: Bibasilar scarring and atelectasis. Bilateral calcified pleural plaque. Electronically Signed   By: Aletta Edouard M.D.   On: 02/28/2016  12:58    Procedures Procedures (including critical care time)  Medications Ordered in ED Medications  sodium chloride 0.9 % bolus 500 mL (not administered)     Initial Impression / Assessment and Plan / ED Course  I have reviewed the triage vital signs and the nursing notes.  Pertinent labs & imaging results that were available during my care of the patient were reviewed by me and considered in my medical decision making (see chart for details).  Clinical Course     Patient with episode of syncope. Has bradycardia while in the ER. Had bradycardia with syncope. May have had decreased oral intake overall recently. Creatinine is increased to 2 from a lower baseline of around 1.4. Does have somewhat frequent PVCs. Was switched from Lopressor to [place]. Will admit to internal medicine.  Final Clinical Impressions(s) / ED Diagnoses   Final diagnoses:  Syncope, unspecified syncope type  Renal insufficiency    New Prescriptions New Prescriptions   No medications on file     Davonna Belling, MD 02/28/16 1545

## 2016-02-28 NOTE — H&P (Signed)
[facility] History and Physical  Richard Reed [id number] DOB: [birth year] DOA: 02/28/2016  Referring physician:  PCP: Tawanna Solo, MD   Chief Complaint: "I just didn't feel right."  HPI: Richard Reed is a 82 y.o. male past medical history of coronary artery disease, asbestosis, COPD, CHF, cancer, CKD, poor hearing, hypertension, irregular heart rate presented to the emergency room with LOC. Patient states that he was in his usual health today when he went to church. He did skip breakfast. He was there for several hours and then while sitting in a pew began to feel odd. Patient states the next thing he knew he was surrounded by several individuals who were standing over him. Patient's wife states that while sitting in church she looked over and she noticed that her husband's head was back in his eyes were open. She thought initially was taking a nap but when she tried to rouse him he did not respond. The church's medical response team stimulated the patient, their exact reactions are unknown. However patient did come around after being unresponsive for about 5 minutes. No tongue biting and no loss of bowel or bladder, no shaking. EMS was activated and brought patient to the emergency room for evaluation.  Patient does not have a history of seizure or family history of seizures.  ED course: Given IV fluid. EKG did not show STEMI. Check chest x-ray which was negative. Consult to hospitalist for admission.  Review of Systems:  As per HPI otherwise 10 point review of systems negative.    Past Medical History:  Diagnosis Date  . Angina   . Aortic valve insufficiency, moderate 10/29/2012  . Arthritis   . Arthritis pain   . CAD (coronary artery disease)    non obstructive  . Cancer ([place])   . CKD (chronic kidney disease)   . Dysrhythmia   . Hard of hearing   . Hemiparesis and alteration of sensations as late effects of stroke ([place]) 04/29/2015  . Hiatal hernia   . HOH (hard  of hearing)   . HTN (hypertension)   . Hyperlipidemia   . Irregular heart rate   . Kidney stones   . Mitral regurgitation   . PAF (paroxysmal atrial fibrillation) ([place]) 10/29/2012  . Pneumonia 03/23/2011   "first time"  . Prostate cancer ([place])   . Sepsis due to Escherichia coli ([place]) 03/24/11  . TIA (transient ischemic attack)   . Visual disturbance 03/19/2014   Past Surgical History:  Procedure Laterality Date  . CARDIAC CATHETERIZATION  09/2010   non obstructive CAD, 40-60% ostial RCA stenosis without dampening   . CATARACT EXTRACTION Bilateral   . CHOLECYSTECTOMY  04/15/2011   Procedure: LAPAROSCOPIC CHOLECYSTECTOMY WITH INTRAOPERATIVE CHOLANGIOGRAM;  Surgeon: Belva Crome, MD;  Location: [facility];  Service: General;  Laterality: N/A;  . PROSTATE SURGERY     approx 10 years ago, seed implant radiation  . TONSILLECTOMY     Social History:  reports that he has never smoked. He has never used smokeless tobacco. He reports that he does not drink alcohol or use drugs.  No Known Allergies  Family History  Problem Relation Age of Onset  . Heart failure Mother   . Heart disease Mother   . Heart disease Father   . Cancer Brother     prostate     Prior to Admission medications   Medication Sig Start Date End Date Taking? Authorizing Provider  aspirin EC 81 MG tablet Take 81 mg by  mouth daily.   Yes Historical Provider, MD  Calcium 500 MG CHEW Chew by mouth daily.   Yes Historical Provider, MD  carvedilol (COREG) 12.5 MG tablet Take 12.5 mg by mouth 2 (two) times daily. 02/19/16  Yes Historical Provider, MD  cyanocobalamin 500 MCG tablet Take 500 mcg by mouth daily.   Yes Historical Provider, MD  DULERA 200-5 MCG/ACT AERO Take 2 puffs by mouth 2 (two) times daily. 02/19/16  Yes Historical Provider, MD  fish oil-omega-3 fatty acids 1000 MG capsule Take 1 g by mouth daily.    Yes Historical Provider, MD  furosemide (LASIX) 20 MG tablet Take 40 mg by mouth daily. 02/19/16  Yes Historical  Provider, MD  lisinopril (PRINIVIL,ZESTRIL) 5 MG tablet Take 10 mg by mouth daily. 02/19/16  Yes Historical Provider, MD  Multiple Vitamin (MULTIVITAMIN) tablet Take 1 tablet by mouth daily.   Yes Historical Provider, MD  spironolactone (ALDACTONE) 25 MG tablet Take 12.5 mg by mouth daily. 02/19/16  Yes Historical Provider, MD  metoprolol tartrate (LOPRESSOR) 25 MG tablet TAKE ONE-HALF TABLET BY MOUTH TWO TIMES A DAY Patient not taking: Reported on 02/28/2016 05/18/15   Lorretta Harp, MD  rosuvastatin (CRESTOR) 10 MG tablet Take 1 tablet (10 mg total) by mouth daily. Patient not taking: Reported on 02/28/2016 04/29/15   Kathrynn Ducking, MD   Physical Exam: Vitals:   02/28/16 1347 02/28/16 1430 02/28/16 1500 02/28/16 1530  BP:  97/61 (!) 106/53 129/65  Pulse:  61 69 (!) 125  Resp:   16 17  Temp: 97.5 F (36.4 C)     TempSrc: Oral     SpO2:  95% 97% 100%    Wt Readings from Last 3 Encounters:  05/05/15 71.2 kg (157 lb)  04/29/15 72.8 kg (160 lb 8 oz)  12/02/14 73.5 kg (162 lb)    General:  Appears calm and comfortable Eyes:  PERRL, EOMI, normal lids, iris ENT:  grossly poor hearing, lips & tongue Neck:  no LAD, masses or thyromegaly Cardiovascular:  RRR, no m/r/g. No LE edema.  Respiratory:  CTA bilaterally, no w/r/r. Normal respiratory effort. Abdomen:  soft, ntnd Skin:  no rash or induration seen on limited exam Musculoskeletal:  grossly normal tone BUE/BLE Psychiatric:  grossly normal mood and affect, speech fluent and appropriate Neurologic:  CN 2-12 grossly intact, moves all extremities in coordinated fashion.          Labs on Admission:  Basic Metabolic Panel:  Recent Labs Lab 02/28/16 1307  Richard 136  K 5.0  CL 104  CO2 24  GLUCOSE 94  BUN 59*  CREATININE 2.04*  CALCIUM 8.8*   Liver Function Tests:  Recent Labs Lab 02/28/16 1307  AST 16  ALT 12*  ALKPHOS 58  BILITOT 1.2  PROT 5.9*  ALBUMIN 3.4*   No results for input(s): LIPASE, AMYLASE in the  last 168 hours. No results for input(s): AMMONIA in the last 168 hours. CBC:  Recent Labs Lab 02/28/16 1307  WBC 4.8  NEUTROABS 3.6  HGB 13.1  HCT 39.3  MCV 88.7  PLT 131*   Cardiac Enzymes:  Recent Labs Lab 02/28/16 1307  TROPONINI <0.03    BNP (last 3 results) No results for input(s): BNP in the last 8760 hours.  ProBNP (last 3 results) No results for input(s): PROBNP in the last 8760 hours.   Creatinine clearance cannot be calculated (Unknown ideal weight.)  CBG: No results for input(s): GLUCAP in the last 168 hours.  Radiological Exams on Admission: Dg Chest Portable 1 View  Result Date: 02/28/2016 CLINICAL DATA:  Syncopal episode with loss of consciousness. EXAM: PORTABLE CHEST 1 VIEW COMPARISON:  03/28/2011 FINDINGS: Stable top-normal heart size and ectasia of the thoracic aorta. Bibasilar scarring and atelectasis present. Calcification in the left hemithorax likely relates to calcify pleural plaque. There is additional calcified pleural plaque along the right diaphragmatic surface. There is no evidence of pulmonary edema, consolidation, pneumothorax, nodule or pleural fluid. The visualized skeletal structures are unremarkable. IMPRESSION: Bibasilar scarring and atelectasis. Bilateral calcified pleural plaque. Electronically Signed   By: Aletta Edouard M.D.   On: 02/28/2016 12:58    EKG: Independently reviewed. VR 54, PR 150, QRS 118, QTc 440, no STEMI, NSR Compared August 2016 EKG no acute ST changes, nonspecific interventricular delay, PVCs resolved  Assessment/Plan Principal Problem:   LOC (loss of consciousness) (HCC) Active Problems:   Acute on chronic renal failure (HCC)   Thrombocytopenia (HCC)   UTI (urinary tract infection)  LOC Patient was found not to be hypoglycemic High concern for neurological event, patient does have a history of TIA. Checking head CT. Giving adult ASA. Swallow screen. Cardiac is also a differential possibility of  arrhythmia, patient was bradycardic on presentation Holding coreg, resume in AM at half dose ECHO Complete tomorrow Trend trop first neg Tele Neurology consulted CT head w/o cont ordered Ziopatch or event monitor on d/c and send pt to his cardio for f/u Orthostatics  UTI UCX ordered Start on rocephin Hx of prostate issues, checking PVR  CHF Holding coreg, restart in AM at lower dose if appropriate Lasix qd Lisinopril qd Aldactone qd  AKI Baseline Cr 1.48 (2016), Cr on admit 2.04 Looks prerenal 500 of normal saline given in the emergency room Gentle hydration overnight Checking magnesium and phosphorus Urine labs ordered to calculate fractional excretion of urea  Low Plts Chronic No overt bleed Will monitor  Hypertension When necessary hydralazine 10 mg IV as needed for severe blood pressure  COPD Cont dulera Prn albuterol  Hx of CVA Cont 81mg  ASA  Status - obs tele  Code Status: FULL  DVT Prophylaxis: lovenox Family Communication: wife at bedside Disposition Plan: Pending Improvement    Elwin Mocha, MD Family Medicine [facility] [email or web address] Password [email or web address]

## 2016-02-28 NOTE — Consult Note (Signed)
NEURO HOSPITALIST CONSULT NOTE   Requestig physician: Dr. Eliseo Squires  Reason for Consult: Seizure versus syncope  History obtained from:  Patient and Chart     HPI:                                                                                                                                          Richard Reed is an 82 y.o. male who presented to the ED after a spell of unresponsiveness in church. Noted to have eyes open with neck extended and not arousable for about 5 minutes. No jerking was associated with the episode.   Additional description of event in H and P was reviewed:  "Patient states that he was in his usual health today when he went to church. He did skip breakfast. He was there for several hours and then while sitting in a pew began to feel odd. Patient states the next thing he knew he was surrounded by several individuals who were standing over him. Patient's wife states that while sitting in church she looked over and she noticed that her husband's head was back in his eyes were open. She thought initially was taking a nap but when she tried to rouse him he did not respond. The church's medical response team stimulated the patient, their exact reactions are unknown. However patient did come around after being unresponsive for about 5 minutes. No tongue biting and no loss of bowel or bladder, no shaking. EMS was activated and brought patient to the emergency room for evaluation."  His PMHx includes aortic valve insufficiency, CAD, stroke, COPD, asbestosis, CHF, paroxysmal atrial fibrillation, cancer, CKD and HTN. He does not have a prior history of seizure.   Past Medical History:  Diagnosis Date  . Angina   . Aortic valve insufficiency, moderate 10/29/2012  . Arthritis   . Arthritis pain   . CAD (coronary artery disease)    non obstructive  . Cancer ([place])   . CKD (chronic kidney disease)   . Dysrhythmia   . Hard of hearing   . Hemiparesis and alteration  of sensations as late effects of stroke ([place]) 04/29/2015  . Hiatal hernia   . HOH (hard of hearing)   . HTN (hypertension)   . Hyperlipidemia   . Irregular heart rate   . Kidney stones   . Mitral regurgitation   . PAF (paroxysmal atrial fibrillation) ([place]) 10/29/2012  . Pneumonia 03/23/2011   "first time"  . Prostate cancer ([place])   . Sepsis due to Escherichia coli ([place]) 03/24/11  . TIA (transient ischemic attack)   . Visual disturbance 03/19/2014    Past Surgical History:  Procedure Laterality Date  . CARDIAC CATHETERIZATION  09/2010   non obstructive CAD, 40-60% ostial RCA stenosis without dampening   .  CATARACT EXTRACTION Bilateral   . CHOLECYSTECTOMY  04/15/2011   Procedure: LAPAROSCOPIC CHOLECYSTECTOMY WITH INTRAOPERATIVE CHOLANGIOGRAM;  Surgeon: Belva Crome, MD;  Location: [facility];  Service: General;  Laterality: N/A;  . PROSTATE SURGERY     approx 10 years ago, seed implant radiation  . TONSILLECTOMY      Family History  Problem Relation Age of Onset  . Heart failure Mother   . Heart disease Mother   . Heart disease Father   . Cancer Brother     prostate   Social History:  reports that he has never smoked. He has never used smokeless tobacco. He reports that he does not drink alcohol or use drugs.  No Known Allergies  MEDICATIONS:                                                                                                                      Current Facility-Administered Medications:  .  0.9 %  sodium chloride infusion, , Intravenous, Continuous, Elwin Mocha, MD, Last Rate: 100 mL/hr at 02/29/16 0559 .  acetaminophen (TYLENOL) tablet 650 mg, 650 mg, Oral, Q6H PRN **OR** acetaminophen (TYLENOL) suppository 650 mg, 650 mg, Rectal, Q6H PRN, Elwin Mocha, MD .  albuterol (PROVENTIL) (2.5 MG/3ML) 0.083% nebulizer solution 2.5 mg, 2.5 mg, Nebulization, Q2H PRN, Elwin Mocha, MD .  aspirin EC tablet 325 mg, 325 mg, Oral, Daily, Elwin Mocha, MD, 325 mg at  02/28/16 2137 .  cefTRIAXone (ROCEPHIN) 1 g in dextrose 5 % 50 mL IVPB, 1 g, Intravenous, Q24H, Elwin Mocha, MD, 1 g at 02/28/16 1838 .  enoxaparin (LOVENOX) injection 30 mg, 30 mg, Subcutaneous, Q24H, Elwin Mocha, MD .  furosemide (LASIX) tablet 40 mg, 40 mg, Oral, Daily, Elwin Mocha, MD .  hydrALAZINE (APRESOLINE) injection 10 mg, 10 mg, Intravenous, Q8H PRN, Elwin Mocha, MD .  lisinopril (PRINIVIL,ZESTRIL) tablet 10 mg, 10 mg, Oral, Daily, Elwin Mocha, MD .  mometasone-formoterol [facility]) 200-5 MCG/ACT inhaler 2 puff, 2 puff, Inhalation, BID, Elwin Mocha, MD .  ondansetron [facility]) tablet 4 mg, 4 mg, Oral, Q6H PRN **OR** ondansetron (ZOFRAN) injection 4 mg, 4 mg, Intravenous, Q6H PRN, Elwin Mocha, MD .  sodium chloride flush (NS) 0.9 % injection 3 mL, 3 mL, Intravenous, Q12H, Elwin Mocha, MD .  spironolactone (ALDACTONE) tablet 12.5 mg, 12.5 mg, Oral, Daily, Elwin Mocha, MD   Home medications: Current Meds  Medication Sig  . aspirin EC 81 MG tablet Take 81 mg by mouth daily.  . Calcium 500 MG CHEW Chew by mouth daily.  . carvedilol (COREG) 12.5 MG tablet Take 12.5 mg by mouth 2 (two) times daily.  . cyanocobalamin 500 MCG tablet Take 500 mcg by mouth daily.  Ruthe Mannan 200-5 MCG/ACT AERO Take 2 puffs by mouth 2 (two) times daily.  . fish oil-omega-3 fatty acids 1000 MG capsule Take 1 g by mouth daily.   . furosemide (LASIX) 20 MG tablet  Take 40 mg by mouth daily.  Marland Kitchen lisinopril (PRINIVIL,ZESTRIL) 5 MG tablet Take 10 mg by mouth daily.  . Multiple Vitamin (MULTIVITAMIN) tablet Take 1 tablet by mouth daily.  Marland Kitchen spironolactone (ALDACTONE) 25 MG tablet Take 12.5 mg by mouth daily.     ROS:                                                                                                                                       History obtained from patient. Denies fever, headache, chest pain, abdominal pain or limb pain.  Blood pressure (!) 73/48, pulse 70,  temperature 98.7 F (37.1 C), temperature source Oral, resp. rate 16, height 5\' 8"  (1.727 m), weight 66 kg (145 lb 8 oz), SpO2 95 %.   General Examination:                                                                                                      HEENT-  Normocephalic/atraumatic.  Lungs- Respirations unlabored.  Extremities- Warm and well-perfused  Neurological Examination Mental Status: Alert, oriented, thought content appropriate.  Speech fluent without evidence of aphasia.  Able to follow all step commands without difficulty. Cranial Nerves: II: Visual fields intact to confrontation, pupils equal, round and reactive to light III,IV, VI: ptosis not present, EOMI V,VII: smile symmetric, facial temperature sensation normal bilaterally VIII: HOH IX,X: no hypophonia or hoarseness XI: symmetric XII: midline tongue extension Motor: Right : Upper extremity   5/5    Left:     Upper extremity   5/5  Lower extremity   5/5     Lower extremity   5/5 Normal tone throughout; no atrophy noted Sensory: Light touch intact x 4 without extinction Deep Tendon Reflexes: 2+ bilateral upper and lower extremities. 4+ right patella (crossed adductor), 3+ left patella, 1+ achilles bilaterally.  Plantars: Right: downgoing   Left: downgoing Cerebellar: No ataxia on FNF.  Gait: Deferred.   Lab Results: Basic Metabolic Panel:  Recent Labs Lab 02/28/16 1307 02/28/16 1713  NA 136  --   K 5.0  --   CL 104  --   CO2 24  --   GLUCOSE 94  --   BUN 59*  --   CREATININE 2.04*  --   CALCIUM 8.8*  --   MG  --  2.4  PHOS  --  4.5    Liver Function Tests:  Recent Labs Lab 02/28/16 1307  AST 16  ALT 12*  ALKPHOS 58  BILITOT  1.2  PROT 5.9*  ALBUMIN 3.4*   No results for input(s): LIPASE, AMYLASE in the last 168 hours. No results for input(s): AMMONIA in the last 168 hours.  CBC:  Recent Labs Lab 02/28/16 1307  WBC 4.8  NEUTROABS 3.6  HGB 13.1  HCT 39.3  MCV 88.7  PLT 131*     Cardiac Enzymes:  Recent Labs Lab 02/28/16 1307 02/28/16 1713  TROPONINI <0.03 <0.03    Lipid Panel: No results for input(s): CHOL, TRIG, HDL, CHOLHDL, VLDL, LDLCALC in the last 168 hours.  CBG: No results for input(s): GLUCAP in the last 168 hours.  Microbiology: Results for orders placed or performed in visit on 02/11/14  Wound culture     Status: None   Collection Time: 02/11/14  9:29 AM  Result Value Ref Range Status   Gram Stain No WBC Seen  Final   Gram Stain No Squamous Epithelial Cells Seen  Final   Gram Stain No Organisms Seen  Final   Organism ID, Bacteria Few STAPHYLOCOCCUS SPECIES (COAGULASE NEGATIVE)  Final    Coagulation Studies: No results for input(s): LABPROT, INR in the last 72 hours.  Imaging: Dg Chest Portable 1 View  Result Date: 02/28/2016 CLINICAL DATA:  Syncopal episode with loss of consciousness. EXAM: PORTABLE CHEST 1 VIEW COMPARISON:  03/28/2011 FINDINGS: Stable top-normal heart size and ectasia of the thoracic aorta. Bibasilar scarring and atelectasis present. Calcification in the left hemithorax likely relates to calcify pleural plaque. There is additional calcified pleural plaque along the right diaphragmatic surface. There is no evidence of pulmonary edema, consolidation, pneumothorax, nodule or pleural fluid. The visualized skeletal structures are unremarkable. IMPRESSION: Bibasilar scarring and atelectasis. Bilateral calcified pleural plaque. Electronically Signed   By: Aletta Edouard M.D.   On: 02/28/2016 12:58    Assessment 1. Episode of unresponsiveness. No jerking to strongly suggest seizure, but subclinical seizure could result in transient unresponsiveness. Eyes were open, which would be atypical for syncope, although history of arrhythmia does increase the chance that the event was due to cardiogenic syncope. In some cases of dementia, especially Lewy body dementia, cognitive fluctuations including transient unresponsiveness with  eyes open can occur. If he has a history of thrashing movements during sleep, Parkinsonian symptoms or visual hallucinations, possible Lewy body disease should be further evaluated as an outpatient. Stroke would not usually present in this way. Vertebrobasilar insufficiency can result in syncope and therefore is also on DDx.  2. Listed history of paroxysmal atrial fibrillation.  3. 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 Most likely chronic and can be followed up as an outpatient. No associated motor weakness.   Plan 1. EEG.  2. Continue ASA.  3. Cardiac telemetry.  4. Obtain records regarding listed PMHx of a-fib to determine if this is secondary a-fib now resolved or paroxysmal. Not currently on an anticoagulant per medication list.  5. Tilt table testing.  6. MRI brain, MRA head. If MRI is positive for stroke, obtain TTE.  7. Carotid ultrasound to assess for possible vertebrobasilar insufficiency.   Electronically signed: Dr. Kerney Elbe 02/28/2016, 8:32 PM

## 2016-02-28 NOTE — ED Triage Notes (Signed)
[facility] called out to syncope.  Patient was seated at church when witnesses say he became unconscious.  Bystander reported to EMS patient's blood pressure 60/50 manual during syncopal episode with HR in 40's.  Patient HR 55 with BP 126/59 at this time.  Patient is alert and oriented, HOH.

## 2016-02-29 ENCOUNTER — Observation Stay (HOSPITAL_COMMUNITY): Payer: Medicare Other

## 2016-02-29 ENCOUNTER — Ambulatory Visit: Payer: Medicare Other | Admitting: Physician Assistant

## 2016-02-29 ENCOUNTER — Encounter: Payer: Self-pay | Admitting: *Deleted

## 2016-02-29 DIAGNOSIS — R4020 Unspecified coma: Secondary | ICD-10-CM

## 2016-02-29 DIAGNOSIS — J189 Pneumonia, unspecified organism: Secondary | ICD-10-CM | POA: Diagnosis not present

## 2016-02-29 DIAGNOSIS — N289 Disorder of kidney and ureter, unspecified: Secondary | ICD-10-CM | POA: Diagnosis present

## 2016-02-29 DIAGNOSIS — I130 Hypertensive heart and chronic kidney disease with heart failure and stage 1 through stage 4 chronic kidney disease, or unspecified chronic kidney disease: Secondary | ICD-10-CM | POA: Diagnosis present

## 2016-02-29 DIAGNOSIS — N390 Urinary tract infection, site not specified: Secondary | ICD-10-CM | POA: Diagnosis present

## 2016-02-29 DIAGNOSIS — I671 Cerebral aneurysm, nonruptured: Secondary | ICD-10-CM | POA: Diagnosis present

## 2016-02-29 DIAGNOSIS — H9190 Unspecified hearing loss, unspecified ear: Secondary | ICD-10-CM | POA: Diagnosis present

## 2016-02-29 DIAGNOSIS — I2510 Atherosclerotic heart disease of native coronary artery without angina pectoris: Secondary | ICD-10-CM | POA: Diagnosis present

## 2016-02-29 DIAGNOSIS — I480 Paroxysmal atrial fibrillation: Secondary | ICD-10-CM | POA: Diagnosis present

## 2016-02-29 DIAGNOSIS — C801 Malignant (primary) neoplasm, unspecified: Secondary | ICD-10-CM | POA: Diagnosis not present

## 2016-02-29 DIAGNOSIS — Z8042 Family history of malignant neoplasm of prostate: Secondary | ICD-10-CM | POA: Diagnosis not present

## 2016-02-29 DIAGNOSIS — Z7982 Long term (current) use of aspirin: Secondary | ICD-10-CM | POA: Diagnosis not present

## 2016-02-29 DIAGNOSIS — R55 Syncope and collapse: Secondary | ICD-10-CM | POA: Diagnosis not present

## 2016-02-29 DIAGNOSIS — G3183 Dementia with Lewy bodies: Secondary | ICD-10-CM | POA: Diagnosis present

## 2016-02-29 DIAGNOSIS — D696 Thrombocytopenia, unspecified: Secondary | ICD-10-CM

## 2016-02-29 DIAGNOSIS — R001 Bradycardia, unspecified: Secondary | ICD-10-CM | POA: Diagnosis present

## 2016-02-29 DIAGNOSIS — F0280 Dementia in other diseases classified elsewhere without behavioral disturbance: Secondary | ICD-10-CM | POA: Diagnosis present

## 2016-02-29 DIAGNOSIS — Z8249 Family history of ischemic heart disease and other diseases of the circulatory system: Secondary | ICD-10-CM | POA: Diagnosis not present

## 2016-02-29 DIAGNOSIS — N179 Acute kidney failure, unspecified: Secondary | ICD-10-CM | POA: Diagnosis not present

## 2016-02-29 DIAGNOSIS — E785 Hyperlipidemia, unspecified: Secondary | ICD-10-CM | POA: Diagnosis present

## 2016-02-29 DIAGNOSIS — E860 Dehydration: Secondary | ICD-10-CM | POA: Diagnosis present

## 2016-02-29 DIAGNOSIS — Z8546 Personal history of malignant neoplasm of prostate: Secondary | ICD-10-CM | POA: Diagnosis not present

## 2016-02-29 DIAGNOSIS — N189 Chronic kidney disease, unspecified: Secondary | ICD-10-CM | POA: Diagnosis not present

## 2016-02-29 DIAGNOSIS — I69359 Hemiplegia and hemiparesis following cerebral infarction affecting unspecified side: Secondary | ICD-10-CM | POA: Diagnosis not present

## 2016-02-29 DIAGNOSIS — J9690 Respiratory failure, unspecified, unspecified whether with hypoxia or hypercapnia: Secondary | ICD-10-CM | POA: Diagnosis not present

## 2016-02-29 DIAGNOSIS — J449 Chronic obstructive pulmonary disease, unspecified: Secondary | ICD-10-CM | POA: Diagnosis present

## 2016-02-29 DIAGNOSIS — M1990 Unspecified osteoarthritis, unspecified site: Secondary | ICD-10-CM | POA: Diagnosis present

## 2016-02-29 DIAGNOSIS — I5042 Chronic combined systolic (congestive) and diastolic (congestive) heart failure: Secondary | ICD-10-CM | POA: Diagnosis present

## 2016-02-29 DIAGNOSIS — Z9842 Cataract extraction status, left eye: Secondary | ICD-10-CM | POA: Diagnosis not present

## 2016-02-29 DIAGNOSIS — I651 Occlusion and stenosis of basilar artery: Secondary | ICD-10-CM | POA: Diagnosis present

## 2016-02-29 LAB — BASIC METABOLIC PANEL
ANION GAP: 8 (ref 5–15)
BUN: 53 mg/dL — ABNORMAL HIGH (ref 6–20)
CALCIUM: 8.9 mg/dL (ref 8.9–10.3)
CO2: 26 mmol/L (ref 22–32)
CREATININE: 2.05 mg/dL — AB (ref 0.61–1.24)
Chloride: 106 mmol/L (ref 101–111)
GFR, EST AFRICAN AMERICAN: 32 mL/min — AB (ref >60)
GFR, EST NON AFRICAN AMERICAN: 27 mL/min — AB (ref >60)
Glucose, Bld: 86 mg/dL (ref 65–99)
Potassium: 5.4 mmol/L — ABNORMAL HIGH (ref 3.5–5.1)
SODIUM: 140 mmol/L (ref 135–145)

## 2016-02-29 LAB — CBC
HCT: 39.2 % (ref 39.0–52.0)
HEMOGLOBIN: 12.9 g/dL — AB (ref 13.0–17.0)
MCH: 29.3 pg (ref 26.0–34.0)
MCHC: 32.9 g/dL (ref 30.0–36.0)
MCV: 89.1 fL (ref 78.0–100.0)
PLATELETS: 119 10*3/uL — AB (ref 150–400)
RBC: 4.4 MIL/uL (ref 4.22–5.81)
RDW: 15 % (ref 11.5–15.5)
WBC: 4.5 10*3/uL (ref 4.0–10.5)

## 2016-02-29 LAB — URINE CULTURE

## 2016-02-29 MED ORDER — ENSURE ENLIVE PO LIQD
237.0000 mL | Freq: Two times a day (BID) | ORAL | Status: DC
  Administered 2016-02-29 – 2016-03-01 (×3): 237 mL via ORAL

## 2016-02-29 MED ORDER — DIPHENHYDRAMINE HCL 12.5 MG/5ML PO ELIX
12.5000 mg | ORAL_SOLUTION | Freq: Every evening | ORAL | Status: AC | PRN
  Administered 2016-02-29: 12.5 mg via ORAL
  Filled 2016-02-29: qty 10

## 2016-02-29 NOTE — Care Management Note (Addendum)
Case Management Note  Patient Details  Name: Richard Reed MRN: [id number] Date of Birth: 05-07-1925  Subjective/Objective:  Pt presented for syncopal episode. Pt is from home with wife. Pt has support of daughter at the bedside. PTA pt was independent.                   Action/Plan: CM will continue to monitor for additional needs.   Expected Discharge Date:                  Expected Discharge Plan:  Home/Self Care  In-House Referral:  NA  Discharge planning Services  CM Consult  Post Acute Care Choice:   Home Health Choice offered to:   Patient, Spouse  DME Arranged:   Nebulizer/ meds DME Agency:   [facility]  HH Arranged:   PT HH Agency:   [facility]  Status of Service:  Completed If discussed at [place] of Stay Meetings, dates discussed:    Additional Comments: 1441 03-01-16 Jacqlyn Krauss, RN,BSN [phone] CM did speak with pt/ wife in regards to [facility]. Agency List provided and family chose [facility]. Referral made and SOC to begin within 24-48 hrs post d/c. DME Nebulizer Machine ordered and to be delivered to room. CM to provide ensure coupons to family. No further needs from CM at this time.  Bethena Roys, RN 02/29/2016, 2:06 PM

## 2016-02-29 NOTE — Progress Notes (Signed)
VASCULAR LAB PRELIMINARY  PRELIMINARY  PRELIMINARY  PRELIMINARY  Carotid duplex completed.    Preliminary report:  1-39% ICA plaquing.  Tortuous ICA vessels, bilaterally.  Vertebral artery flow is antegrade.   Tilton Marsalis, RVT 02/29/2016, 12:13 PM

## 2016-02-29 NOTE — Evaluation (Signed)
Physical Therapy Evaluation Patient Details Name: Richard Reed MRN: [id number] DOB: 1925/12/28 Today's Date: 02/29/2016   History of Present Illness  Patient is a 82 y/o male with hx of visual disturbance, PAF, HTN, HLD, HOH, CKD, CAD, aortic insufficiency, CVA presents with + LOC at [employer]. Noted to have eyes open with neck extended and not arousable for about 5 minutes. No jerking was associated with the episode. Workup pending.  Clinical Impression  Patient presents with balance deficits, hard of hearing and impaired mobility s/p above. Tolerated gait training with close Min guard for safety due to instability. Reports hx of imbalance due to nystagmus. No falls reported. Able to read lips pretty well. Vitals below. Supine BP 99/65 Sitting BP 119/57 BP post ambulation 105/86 Pt has support of wife and daughters at home and independent PTA. Will follow acutely to maximize independence and mobility prior to return home. Plan for stair training tomorrow as tolerated.     Follow Up Recommendations Home health PT;Supervision - Intermittent    Equipment Recommendations  None recommended by PT    Recommendations for Other Services       Precautions / Restrictions Precautions Precautions: Fall Restrictions Weight Bearing Restrictions: No      Mobility  Bed Mobility Overal bed mobility: Needs Assistance Bed Mobility: Supine to Sit     Supine to sit: Supervision;HOB elevated     General bed mobility comments: No assist needed. No dizziness. Use of rail.  Transfers Overall transfer level: Needs assistance Equipment used: None Transfers: Sit to/from Stand Sit to Stand: Min guard         General transfer comment: Min guard to steady ins tanding. Stood from [employer], from toilet x1. transferred to chair post ambulation.  Ambulation/Gait Ambulation/Gait assistance: Min guard Ambulation Distance (Feet): 250 Feet Assistive device: None Gait Pattern/deviations: Step-through  pattern;Decreased stride length;Staggering right;Staggering left;Drifts right/left   Gait velocity interpretation: at or above normal speed for age/gender General Gait Details: Fast, almost unsafe speed with staggering noted right and left. Hx of downbeating nystagmus reported which pt has been dealing with for years affects balance. No overt LOB. HR 115 bpm during ambulation.  Stairs            Wheelchair Mobility    Modified Rankin (Stroke Patients Only)       Balance Overall balance assessment: Needs assistance Sitting-balance support: Feet supported;No upper extremity supported Sitting balance-Leahy Scale: Good     Standing balance support: During functional activity Standing balance-Leahy Scale: Fair Standing balance comment: Able to stand at sink and wash hands without difficulty.                              Pertinent Vitals/Pain Pain Assessment: No/denies pain    Home Living Family/patient expects to be discharged to:: Private residence Living Arrangements: Spouse/significant other Available Help at Discharge: Family;Available 24 hours/day Type of Home: House Home Access: Stairs to enter Entrance Stairs-Rails: Right Entrance Stairs-Number of Steps: 5 Home Layout: One level Home Equipment: None      Prior Function Level of Independence: Independent         Comments: Does not drive much anymore due to impaired vision     Hand Dominance        Extremity/Trunk Assessment   Upper Extremity Assessment: Defer to OT evaluation           Lower Extremity Assessment: Overall WFL for tasks assessed  Communication   Communication: HOH  Cognition Arousal/Alertness: Awake/alert Behavior During Therapy: Impulsive Overall Cognitive Status: Within Functional Limits for tasks assessed                      General Comments General comments (skin integrity, edema, etc.): Wife and daughters present during session.      Exercises     Assessment/Plan    PT Assessment Patient needs continued PT services  PT Problem List Decreased strength;Decreased mobility;Decreased safety awareness;Decreased balance;Cardiopulmonary status limiting activity;Decreased activity tolerance          PT Treatment Interventions Therapeutic activities;Gait training;Therapeutic exercise;Patient/family education;Balance training;Stair training;Functional mobility training    PT Goals (Current goals can be found in the Care Plan section)  Acute Rehab PT Goals Patient Stated Goal: to go home PT Goal Formulation: With patient Time For Goal Achievement: 03/14/16 Potential to Achieve Goals: Good    Frequency Min 3X/week   Barriers to discharge Inaccessible home environment stairs to enter home    Co-evaluation               End of Session Equipment Utilized During Treatment: Gait belt Activity Tolerance: Patient tolerated treatment well Patient left: in chair;with call bell/phone within reach;with family/visitor present Nurse Communication: Mobility status    Functional Assessment Tool Used: clinical judgment Functional Limitation: Mobility: Walking and moving around Mobility: Walking and Moving Around Current Status [phone]): At least 1 percent but less than 20 percent impaired, limited or restricted Mobility: Walking and Moving Around Goal Status [phone]): At least 1 percent but less than 20 percent impaired, limited or restricted    Time: [id number] PT Time Calculation (min) (ACUTE ONLY): 23 min   Charges:   PT Evaluation $PT Eval Moderate Complexity: 1 Procedure PT Treatments $Gait Training: 8-22 mins   PT G Codes:   PT G-Codes **NOT FOR INPATIENT CLASS** Functional Assessment Tool Used: clinical judgment Functional Limitation: Mobility: Walking and moving around Mobility: Walking and Moving Around Current Status [id number]): At least 1 percent but less than 20 percent impaired, limited or  restricted Mobility: Walking and Moving Around Goal Status [phone]): At least 1 percent but less than 20 percent impaired, limited or restricted    [place] 02/29/2016, 4:50 PM  Wray Kearns, [place], DPT [phone]

## 2016-02-29 NOTE — Plan of Care (Signed)
Problem: Education: Goal: Knowledge of [facility] Education information/materials will improve Outcome: Completed/Met Date Met: 02/29/16 Pt educated throughout entire admission regarding tests, procedures, medications, and available resources.   Problem: Safety: Goal: Ability to remain free from injury will improve Outcome: Completed/Met Date Met: 02/29/16 Pt has remained free form injury during this admission

## 2016-02-29 NOTE — Progress Notes (Addendum)
Initial Nutrition Assessment  DOCUMENTATION CODES:   Severe malnutrition in context of chronic illness  INTERVENTION:    Ensure Enlive po BID, each supplement provides 350 kcal and 20 grams of protein  "Heart Failure Nutrition Therapy for the Undernourished" and "Salt Free Seasonings" handouts were provided to patient's daughter.  NUTRITION DIAGNOSIS:   Malnutrition related to chronic illness as evidenced by severe depletion of muscle mass, severe depletion of body fat.  GOAL:   Patient will meet greater than or equal to 90% of their needs  MONITOR:   PO intake, Supplement acceptance  REASON FOR ASSESSMENT:   Consult Poor PO  ASSESSMENT:   82 y.o. male past medical history of coronary artery disease, asbestosis, COPD, CHF, cancer, CKD, poor hearing, hypertension, irregular heart rate presented to the emergency room with LOC.   Spoke with patient's daughter and wife, who report that patient has been eating poorly for the past few weeks / months due to a poor appetite. He drinks <16 ounces of beverages per day. He likes chocolate Ensure. He only likes foods that are seasoned well. Since he has been following a low sodium diet, including limiting fluids, he has lost weight. 8% weight loss over the past 10 months is not significant. Some weight loss could be related to fluids / dehydration. Nutrition-Focused physical exam completed. Findings are severe fat depletion, severe muscle depletion, and no edema.  Patient with severe PCM. Labs reviewed potassium elevated. Medications reviewed.  Also discussed with patient's wife and daughter recommendations for increasing protein and calorie intake without exceeding sodium limits. Provided 2 handouts from the [employer]. Provided a list of salt free seasonings.   Diet Order:  Diet Heart Room service appropriate? Yes; Fluid consistency: Thin Diet 2 gram sodium Room service appropriate? Yes; Fluid consistency:  Thin  Skin:  Reviewed, no issues  Last BM:  11/12  Height:   Ht Readings from Last 1 Encounters:  02/28/16 5\' 8"  (1.727 m)    Weight:   Wt Readings from Last 1 Encounters:  02/29/16 147 lb 8 oz (66.9 kg)    Ideal Body Weight:  70 kg  BMI:  Body mass index is 22.43 kg/m.  Estimated Nutritional Needs:   Kcal:  1800-2000  Protein:  85-100 gm  Fluid:  1.5 L  EDUCATION NEEDS:   Education needs addressed  Molli Barrows, [place], [place], [place] Pager [phone] After Hours Pager [phone]

## 2016-02-29 NOTE — Procedures (Signed)
ELECTROENCEPHALOGRAM REPORT  Date of Study: 02/29/2016  Patient's Name: Richard Reed MRN: [id number] Date of Birth: [birth year]  Referring Provider: Dr. Eulogio Bear  Clinical History: This is an 82 year old man with an episode of unresopnsiveness.  Medications: acetaminophen (TYLENOL) tablet 650 mg  albuterol (PROVENTIL) (2.5 MG/3ML) 0.083% nebulizer solution 2.5 mg  aspirin EC tablet 325 mg  cefTRIAXone (ROCEPHIN) 1 g in dextrose 5 % 50 mL IVPB  enoxaparin (LOVENOX) injection 30 mg  hydrALAZINE (APRESOLINE) injection 10 mg  mometasone-formoterol (DULERA) 200-5 MCG/ACT inhaler 2 puff  ondansetron (ZOFRAN) injection 4 mg  ondansetron (ZOFRAN) tablet 4 mg  sodium chloride flush (NS) 0.9 % injection 3 mL   Technical Summary: A multichannel digital EEG recording measured by the international 10-20 system with electrodes applied with paste and impedances below 5000 ohms performed in our laboratory with EKG monitoring in an awake and asleep patient.  Hyperventilation was not performed. Photic stimulation was performed.  The digital EEG was referentially recorded, reformatted, and digitally filtered in a variety of bipolar and referential montages for optimal display.    Description: The patient is awake and asleep during the recording. There is no clear posterior dominant rhythm. The background is low voltage and symmetric.  During drowsiness and sleep, there is an increase in theta slowing of the background with poorly formed vertex waves seen.  Photic stimulation did not elicit any abnormalities.  There were no epileptiform discharges or electrographic seizures seen.    EKG lead showed bradycardia with occasional extrasystolic beats.   Impression: This awake and asleep EEG is within normal limits.  Clinical Correlation: A normal EEG does not exclude a clinical diagnosis of epilepsy. Clinical correlation is advised.   Ellouise Newer, M.D.

## 2016-02-29 NOTE — Progress Notes (Signed)
EEG Completed; Results Pending  

## 2016-02-29 NOTE — Progress Notes (Signed)
PROGRESS NOTE    Richard Reed  [id number] DOB: 11-Jun-1925 DOA: 02/28/2016 PCP: Tawanna Solo, MD   Outpatient Specialists:     Brief Narrative:  Richard Reed is a 82 y.o. male past medical history of coronary artery disease, asbestosis, COPD, CHF, cancer, CKD, poor hearing, hypertension, irregular heart rate presented to the emergency room with LOC. Patient states that he was in his usual health today when he went to church. He did skip breakfast. He was there for several hours and then while sitting in a pew began to feel odd. Patient states the next thing he knew he was surrounded by several individuals who were standing over him. Patient's wife states that while sitting in church she looked over and she noticed that her husband's head was back in his eyes were open. She thought initially was taking a nap but when she tried to rouse him he did not respond. The church's medical response team stimulated the patient, their exact reactions are unknown. However patient did come around after being unresponsive for about 5 minutes. No tongue biting and no loss of bowel or bladder, no shaking. EMS was activated and brought patient to the emergency room for evaluation.    Assessment & Plan:   Principal Problem:   LOC (loss of consciousness) (HCC) Active Problems:   Acute on chronic renal failure (HCC)   Thrombocytopenia (HCC)   UTI (urinary tract infection)   LOC- ? Dehydration-- recently started on diuretic after being seen in [facility] hospital Patient was found not to be hypoglycemic High concern for neurological event, patient does have a history of TIA. Cardiac is also a differential possibility of arrhythmia, patient was bradycardic on presentation Holding coreg ECHO Complete tomorrow Tele Neurology consulted- MRI, EEG, carotid ? Need for event monitor  UTI - doubt UCX ordered Start on rocephin  CHF Echo Holding meds  AKI Baseline Cr 1.48 (2016), Cr on admit  2.04 Looks prerenal  Low Plts Chronic No overt bleed Will monitor  Hypertension When necessary hydralazine 10 mg IV as needed for severe blood pressure  COPD?-- will need PFTs Cont dulera Prn albuterol  Hx of CVA Cont 81mg  ASA   DVT prophylaxis:  Lovenox   Code Status: Full Code   Family Communication: daughters  Disposition Plan:     Consultants:        Subjective: Feeling better Very hard of hearing  Objective: Vitals:   02/28/16 1730 02/28/16 2006 02/29/16 0012 02/29/16 0450  BP: (!) 141/104 (!) 73/48 99/62 (!) 107/44  Pulse: 80 70 67 74  Resp: 13 16 14 16   Temp: 98.3 F (36.8 C) 98.7 F (37.1 C) 97.8 F (36.6 C) 98 F (36.7 C)  TempSrc: Oral Oral Oral Oral  SpO2:  95% 96% 98%  Weight: 66 kg (145 lb 8 oz)   66.9 kg (147 lb 8 oz)  Height: 5\' 8"  (1.727 m)       Intake/Output Summary (Last 24 hours) at 02/29/16 1220 Last data filed at 02/29/16 0300  Gross per 24 hour  Intake           966.67 ml  Output              150 ml  Net           816.67 ml   Filed Weights   02/28/16 1730 02/29/16 0450  Weight: 66 kg (145 lb 8 oz) 66.9 kg (147 lb 8 oz)    Examination:  General exam:  Appears calm and comfortable  Respiratory system: Clear to auscultation. Respiratory effort normal. Cardiovascular system: S1 & S2 heard, RRR. No JVD, murmurs, rubs, gallops or clicks. No pedal edema. Gastrointestinal system: Abdomen is nondistended, soft and nontender. No organomegaly or masses felt. Normal bowel sounds heard. Central nervous system: Alert and oriented. No focal neurological deficits.     Data Reviewed: I have personally reviewed following labs and imaging studies  CBC:  Recent Labs Lab 02/28/16 1307 02/29/16 0432  WBC 4.8 4.5  NEUTROABS 3.6  --   HGB 13.1 12.9*  HCT 39.3 39.2  MCV 88.7 89.1  PLT 131* [id number]*   Basic Metabolic Panel:  Recent Labs Lab 02/28/16 1307 02/28/16 1713 02/29/16 0432  NA 136  --  140  K 5.0  --   5.4*  CL 104  --  106  CO2 24  --  26  GLUCOSE 94  --  86  BUN 59*  --  53*  CREATININE 2.04*  --  2.05*  CALCIUM 8.8*  --  8.9  MG  --  2.4  --   PHOS  --  4.5  --    GFR: Estimated Creatinine Clearance (by C-G formula based on SCr of 2.05 mg/dL (H)) Male: 19.1 mL/min Male: 23.6 mL/min Liver Function Tests:  Recent Labs Lab 02/28/16 1307  AST 16  ALT 12*  ALKPHOS 58  BILITOT 1.2  PROT 5.9*  ALBUMIN 3.4*   No results for input(s): LIPASE, AMYLASE in the last 168 hours. No results for input(s): AMMONIA in the last 168 hours. Coagulation Profile: No results for input(s): INR, PROTIME in the last 168 hours. Cardiac Enzymes:  Recent Labs Lab 02/28/16 1307 02/28/16 1713  TROPONINI <0.03 <0.03   BNP (last 3 results) No results for input(s): PROBNP in the last 8760 hours. HbA1C: No results for input(s): HGBA1C in the last 72 hours. CBG: No results for input(s): GLUCAP in the last 168 hours. Lipid Profile: No results for input(s): CHOL, HDL, LDLCALC, TRIG, CHOLHDL, LDLDIRECT in the last 72 hours. Thyroid Function Tests:  Recent Labs  02/28/16 1307  TSH 3.567   Anemia Panel: No results for input(s): VITAMINB12, FOLATE, FERRITIN, TIBC, IRON, RETICCTPCT in the last 72 hours. Urine analysis:    Component Value Date/Time   COLORURINE YELLOW 02/28/2016 1530   APPEARANCEUR CLOUDY (A) 02/28/2016 1530   LABSPEC 1.009 02/28/2016 1530   PHURINE 5.0 02/28/2016 1530   GLUCOSEU NEGATIVE 02/28/2016 1530   HGBUR TRACE (A) 02/28/2016 1530   BILIRUBINUR NEGATIVE 02/28/2016 1530   BILIRUBINUR neg 07/17/2012 1436   KETONESUR NEGATIVE 02/28/2016 1530   PROTEINUR NEGATIVE 02/28/2016 1530   UROBILINOGEN 0.2 07/17/2012 1436   UROBILINOGEN 1.0 04/13/2011 0220   NITRITE NEGATIVE 02/28/2016 1530   LEUKOCYTESUR MODERATE (A) 02/28/2016 1530     )No results found for this or any previous visit (from the past 240 hour(s)).    Anti-infectives    Start     Dose/Rate Route  Frequency Ordered Stop   02/28/16 1800  cefTRIAXone (ROCEPHIN) 1 g in dextrose 5 % 50 mL IVPB     1 g 100 mL/hr over 30 Minutes Intravenous Every 24 hours 02/28/16 1747         Radiology Studies: Ct Head Wo Contrast  Result Date: 02/28/2016 CLINICAL DATA:  Acute onset of loss of consciousness. Initial encounter. EXAM: CT HEAD WITHOUT CONTRAST TECHNIQUE: Contiguous axial images were obtained from the base of the skull through the vertex without intravenous contrast. COMPARISON:  CT of the head performed 03/23/2011 FINDINGS: Brain: No evidence of acute infarction, hemorrhage, hydrocephalus, extra-axial collection or mass lesion/mass effect. Prominence of the ventricles and sulci reflects mild to moderate cortical volume loss. Cerebellar atrophy is noted. Mild periventricular white matter change likely reflects small vessel ischemic microangiopathy. Mild chronic ischemic change is noted at the left basal ganglia. The brainstem and fourth ventricle are within normal limits. The cerebral hemispheres demonstrate grossly normal gray-white differentiation. No mass effect or midline shift is seen. Vascular: No hyperdense vessel or unexpected calcification. Skull: There is no evidence of fracture; visualized osseous structures are unremarkable in appearance. Sinuses/Orbits: The orbits are within normal limits. A mucus retention cyst or polyp is noted at the left frontal sinus. The remaining paranasal sinuses and left mastoid air cells are well-aerated. There is partial opacification of the right mastoid air cells. Other: No significant soft tissue abnormalities are seen. IMPRESSION: 1. No acute intracranial pathology seen on CT. 2. Mild to moderate cortical volume loss and scattered small vessel ischemic microangiopathy. 3. Mild chronic ischemic change at the left basal ganglia. 4. Mucus retention cyst or polyp at the left frontal sinus. Partial opacification of the right mastoid air cells. Electronically Signed    By: Garald Balding M.D.   On: 02/28/2016 23:47   Mr Brain Wo Contrast  Result Date: 02/29/2016 CLINICAL DATA:  82 year old with loss of consciousness yesterday. Subsequent encounter. EXAM: MRI HEAD WITHOUT CONTRAST MRA HEAD WITHOUT CONTRAST TECHNIQUE: Multiplanar, multiecho pulse sequences of the brain and surrounding structures were obtained without intravenous contrast. Angiographic images of the head were obtained using MRA technique without contrast. COMPARISON:  02/28/2016 head CT. 10/11/2014 and 02/21/2014 brain MR. FINDINGS: MRI HEAD FINDINGS Brain: No acute infarct or intracranial hemorrhage. Moderate chronic microvascular changes. Global atrophy without hydrocephalus. No intracranial mass lesion noted on this unenhanced exam. Vascular: As below. Skull and upper cervical spine: Cervical spondylotic changes with spinal stenosis and cord flattening C3-4 and C4-5. Cervical spine MR can be obtained for further delineation if clinically desired. Sinuses/Orbits: No acute orbital abnormality post lens replacement. Polypoid opacification left frontal -ethmoid junction. Mild mucosal thickening ethmoid sinus air cells. Minimal mucosal thickening maxillary sinuses. Prominent mucosal thickening middle and inferior turbinates bilaterally. Other: Negative MRA HEAD FINDINGS Marked long segment narrowing basilar artery. Vertebral artery is not visualized after takeoff of the posterior inferior cerebellar artery bilaterally. Moderate narrowing of portions of the posterior inferior cerebellar artery bilaterally. Nonvisualized anterior inferior cerebellar arteries bilaterally. Moderate narrowing superior cerebellar arteries bilaterally. 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 IMPRESSION: MRI HEAD No acute infarct or intracranial hemorrhage. Moderate chronic microvascular changes. Global atrophy without hydrocephalus. Cervical  spondylotic changes with spinal stenosis and cord flattening C3-4 and C4-5. Cervical spine MR can be obtained for further delineation if clinically desired. Polypoid opacification left frontal -ethmoid junction. Mild mucosal thickening ethmoid sinus air cells. Minimal mucosal thickening maxillary sinuses. Prominent mucosal thickening middle and inferior turbinates bilaterally. MRA HEAD Marked long segment narrowing basilar artery. Neither vertebral artery is not visualized after takeoff of the posterior inferior cerebellar artery. Moderate narrowing of portions of the posterior inferior cerebellar artery bilaterally. Nonvisualized anterior inferior cerebellar arteries bilaterally. Moderate narrowing superior cerebellar arteries bilaterally. 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 Electronically Signed   By: Genia Del M.D.   On: 02/29/2016 11:13   Dg Chest Portable 1 View  Result Date:  02/28/2016 CLINICAL DATA:  Syncopal episode with loss of consciousness. EXAM: PORTABLE CHEST 1 VIEW COMPARISON:  03/28/2011 FINDINGS: Stable top-normal heart size and ectasia of the thoracic aorta. Bibasilar scarring and atelectasis present. Calcification in the left hemithorax likely relates to calcify pleural plaque. There is additional calcified pleural plaque along the right diaphragmatic surface. There is no evidence of pulmonary edema, consolidation, pneumothorax, nodule or pleural fluid. The visualized skeletal structures are unremarkable. IMPRESSION: Bibasilar scarring and atelectasis. Bilateral calcified pleural plaque. Electronically Signed   By: Aletta Edouard M.D.   On: 02/28/2016 12:58   Mr Lovenia Kim  Result Date: 02/29/2016 CLINICAL DATA:  82 year old with loss of consciousness yesterday. Subsequent encounter. EXAM: MRI HEAD WITHOUT CONTRAST MRA HEAD WITHOUT CONTRAST TECHNIQUE: Multiplanar, multiecho pulse  sequences of the brain and surrounding structures were obtained without intravenous contrast. Angiographic images of the head were obtained using MRA technique without contrast. COMPARISON:  02/28/2016 head CT. 10/11/2014 and 02/21/2014 brain MR. FINDINGS: MRI HEAD FINDINGS Brain: No acute infarct or intracranial hemorrhage. Moderate chronic microvascular changes. Global atrophy without hydrocephalus. No intracranial mass lesion noted on this unenhanced exam. Vascular: As below. Skull and upper cervical spine: Cervical spondylotic changes with spinal stenosis and cord flattening C3-4 and C4-5. Cervical spine MR can be obtained for further delineation if clinically desired. Sinuses/Orbits: No acute orbital abnormality post lens replacement. Polypoid opacification left frontal -ethmoid junction. Mild mucosal thickening ethmoid sinus air cells. Minimal mucosal thickening maxillary sinuses. Prominent mucosal thickening middle and inferior turbinates bilaterally. Other: Negative MRA HEAD FINDINGS Marked long segment narrowing basilar artery. Vertebral artery is not visualized after takeoff of the posterior inferior cerebellar artery bilaterally. Moderate narrowing of portions of the posterior inferior cerebellar artery bilaterally. Nonvisualized anterior inferior cerebellar arteries bilaterally. Moderate narrowing superior cerebellar arteries bilaterally. 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 IMPRESSION: MRI HEAD No acute infarct or intracranial hemorrhage. Moderate chronic microvascular changes. Global atrophy without hydrocephalus. Cervical spondylotic changes with spinal stenosis and cord flattening C3-4 and C4-5. Cervical spine MR can be obtained for further delineation if clinically desired. Polypoid opacification left frontal -ethmoid junction. Mild mucosal thickening ethmoid sinus air cells. Minimal mucosal thickening  maxillary sinuses. Prominent mucosal thickening middle and inferior turbinates bilaterally. MRA HEAD Marked long segment narrowing basilar artery. Neither vertebral artery is not visualized after takeoff of the posterior inferior cerebellar artery. Moderate narrowing of portions of the posterior inferior cerebellar artery bilaterally. Nonvisualized anterior inferior cerebellar arteries bilaterally. Moderate narrowing superior cerebellar arteries bilaterally. 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 Electronically Signed   By: Genia Del M.D.   On: 02/29/2016 11:13        Scheduled Meds: . aspirin EC  325 mg Oral Daily  . cefTRIAXone (ROCEPHIN)  IV  1 g Intravenous Q24H  . enoxaparin (LOVENOX) injection  30 mg Subcutaneous Q24H  . mometasone-formoterol  2 puff Inhalation BID  . sodium chloride flush  3 mL Intravenous Q12H   Continuous Infusions: . sodium chloride 100 mL/hr at 02/29/16 0559     LOS: 0 days    Time spent: 25 min    [place], DO [facility] Pager [phone]  If 7PM-7AM, please contact night-coverage [email or web address] Password [email or web address] 02/29/2016, 12:20 PM

## 2016-02-29 NOTE — Care Management Obs Status (Signed)
[place] NOTIFICATION   Patient Details  Name: DAQUAWN HAGEDORN MRN: [id number] Date of Birth: [birth year]   Medicare Observation Status Notification Given:  Yes    Bethena Roys, RN 02/29/2016, 2:05 PM

## 2016-03-01 ENCOUNTER — Inpatient Hospital Stay (HOSPITAL_COMMUNITY): Payer: Medicare Other

## 2016-03-01 ENCOUNTER — Encounter (HOSPITAL_COMMUNITY): Payer: Self-pay | Admitting: General Practice

## 2016-03-01 DIAGNOSIS — R55 Syncope and collapse: Secondary | ICD-10-CM

## 2016-03-01 DIAGNOSIS — N179 Acute kidney failure, unspecified: Secondary | ICD-10-CM

## 2016-03-01 DIAGNOSIS — N189 Chronic kidney disease, unspecified: Secondary | ICD-10-CM

## 2016-03-01 LAB — CBC
HCT: 36.9 % — ABNORMAL LOW (ref 39.0–52.0)
HEMOGLOBIN: 12.4 g/dL — AB (ref 13.0–17.0)
MCH: 29.9 pg (ref 26.0–34.0)
MCHC: 33.6 g/dL (ref 30.0–36.0)
MCV: 88.9 fL (ref 78.0–100.0)
PLATELETS: 112 10*3/uL — AB (ref 150–400)
RBC: 4.15 MIL/uL — AB (ref 4.22–5.81)
RDW: 14.9 % (ref 11.5–15.5)
WBC: 3.7 10*3/uL — ABNORMAL LOW (ref 4.0–10.5)

## 2016-03-01 LAB — BASIC METABOLIC PANEL
Anion gap: 4 — ABNORMAL LOW (ref 5–15)
BUN: 42 mg/dL — AB (ref 6–20)
CHLORIDE: 111 mmol/L (ref 101–111)
CO2: 25 mmol/L (ref 22–32)
CREATININE: 1.83 mg/dL — AB (ref 0.61–1.24)
Calcium: 8.3 mg/dL — ABNORMAL LOW (ref 8.9–10.3)
GFR, EST AFRICAN AMERICAN: 36 mL/min — AB (ref >60)
GFR, EST NON AFRICAN AMERICAN: 31 mL/min — AB (ref >60)
Glucose, Bld: 100 mg/dL — ABNORMAL HIGH (ref 65–99)
Potassium: 4.6 mmol/L (ref 3.5–5.1)
SODIUM: 140 mmol/L (ref 135–145)

## 2016-03-01 LAB — UREA NITROGEN, URINE: Urea Nitrogen, Ur: 476 mg/dL

## 2016-03-01 LAB — ECHOCARDIOGRAM COMPLETE
HEIGHTINCHES: 68 in
WEIGHTICAEL: 2380.8 [oz_av]

## 2016-03-01 MED ORDER — ENSURE ENLIVE PO LIQD: 237.0000 mL | Freq: Two times a day (BID) | ORAL | 12 refills | Status: AC

## 2016-03-01 MED ORDER — FUROSEMIDE 20 MG PO TABS
20.0000 mg | ORAL_TABLET | Freq: Every day | ORAL | 0 refills | Status: DC | PRN
Start: 2016-03-01 — End: 2016-04-26

## 2016-03-01 NOTE — Progress Notes (Signed)
Physical Therapy Treatment Patient Details Name: Richard Reed MRN: [id number] DOB: 1926/04/05 Today's Date: 03/01/2016    History of Present Illness Patient is a 82 y/o male with hx of visual disturbance, PAF, HTN, HLD, HOH, CKD, CAD, aortic insufficiency, CVA presents with + LOC at church. Noted to have eyes open with neck extended and not arousable for about 5 minutes. No jerking was associated with the episode. Workup pending.    PT Comments    Patient progressing well towards PT goals. More steady with safe gait speed today. Per wife, pt waxes and wanes with his balance. Tolerated stair training with Min guard assist for safety. Pt will have support from spouse and daughters at home. Will follow acutely for higher level balance activities.   Follow Up Recommendations  Supervision - Intermittent;No PT follow up     Equipment Recommendations  None recommended by PT    Recommendations for Other Services       Precautions / Restrictions Precautions Precautions: Fall Restrictions Weight Bearing Restrictions: No    Mobility  Bed Mobility Overal bed mobility: Needs Assistance Bed Mobility: Supine to Sit;Sit to Supine     Supine to sit: Supervision;HOB elevated Sit to supine: Supervision;HOB elevated   General bed mobility comments: No assist needed. Dizziness. Use of rail.  Transfers Overall transfer level: Needs assistance Equipment used: None Transfers: Sit to/from Stand Sit to Stand: Min guard         General transfer comment: Min guard to steady ins tanding. Stood from [employer].   Ambulation/Gait Ambulation/Gait assistance: Min guard Ambulation Distance (Feet): 400 Feet Assistive device: None Gait Pattern/deviations: Step-through pattern;Decreased stride length   Gait velocity interpretation: at or above normal speed for age/gender General Gait Details: More safe speed today. Some drifting noted right/left. Reports he is better able to keep his balance with  foward gaze.    Stairs Stairs: Yes Stairs assistance: Min guard Stair Management: Alternating pattern;One rail Right Number of Stairs: 4 General stair comments: Cues for technique and safety. Cues to decrease speed.   Wheelchair Mobility    Modified Rankin (Stroke Patients Only)       Balance Overall balance assessment: Needs assistance Sitting-balance support: Feet supported;No upper extremity supported Sitting balance-Leahy Scale: Good     Standing balance support: During functional activity Standing balance-Leahy Scale: Fair                      Cognition Arousal/Alertness: Awake/alert Behavior During Therapy: Impulsive Overall Cognitive Status: Within Functional Limits for tasks assessed                      Exercises      General Comments        Pertinent Vitals/Pain Pain Assessment: No/denies pain    Home Living Family/patient expects to be discharged to:: Private residence Living Arrangements: Spouse/significant other                  Prior Function            PT Goals (current goals can now be found in the care plan section) Progress towards PT goals: Progressing toward goals    Frequency    Min 3X/week      PT Plan Current plan remains appropriate    Co-evaluation             End of Session Equipment Utilized During Treatment: Gait belt Activity Tolerance: Patient tolerated treatment well Patient left:  in bed;with call bell/phone within reach;with family/visitor present     Time: 0952-1011 PT Time Calculation (min) (ACUTE ONLY): 19 min  Charges:  $Gait Training: 8-22 mins                    G Codes:      Braelee Herrle A Gitel Beste 03/01/2016, 12:37 PM Wray Kear[place], [place], DPT [phone]

## 2016-03-01 NOTE — Progress Notes (Signed)
Neurology Progress Note  Subjective: No complaints this morning. He feels well. He denies any lightheadedness, dizziness, or vertigo. He has not weakness, numbness, double vision, slurred speech, or trouble swallowing. He reports that he is at his normal baseline. He is eating without difficulty though does note reduced appetite for the past several months. He is sleeping well.   Current Meds:   Current Facility-Administered Medications:  .  0.9 %  sodium chloride infusion, , Intravenous, Continuous, Elwin Mocha, MD, Last Rate: 100 mL/hr at 03/01/16 [id number] .  acetaminophen (TYLENOL) tablet 650 mg, 650 mg, Oral, Q6H PRN **OR** acetaminophen (TYLENOL) suppository 650 mg, 650 mg, Rectal, Q6H PRN, Elwin Mocha, MD .  albuterol (PROVENTIL) (2.5 MG/3ML) 0.083% nebulizer solution 2.5 mg, 2.5 mg, Nebulization, Q2H PRN, Elwin Mocha, MD .  aspirin EC tablet 325 mg, 325 mg, Oral, Daily, Elwin Mocha, MD, 325 mg at 03/01/16 [id number] .  cefTRIAXone (ROCEPHIN) 1 g in dextrose 5 % 50 mL IVPB, 1 g, Intravenous, Q24H, Elwin Mocha, MD, 1 g at 02/29/16 1707 .  enoxaparin (LOVENOX) injection 30 mg, 30 mg, Subcutaneous, Q24H, Elwin Mocha, MD .  feeding supplement (ENSURE ENLIVE) (ENSURE ENLIVE) liquid 237 mL, 237 mL, Oral, BID BM, Jessica U Vann, DO, 237 mL at 03/01/16 0821 .  hydrALAZINE (APRESOLINE) injection 10 mg, 10 mg, Intravenous, Q8H PRN, Elwin Mocha, MD .  mometasone-formoterol [facility]) 200-5 MCG/ACT inhaler 2 puff, 2 puff, Inhalation, BID, Elwin Mocha, MD, 2 puff at 02/29/16 2059 .  ondansetron (ZOFRAN) tablet 4 mg, 4 mg, Oral, Q6H PRN **OR** ondansetron (ZOFRAN) injection 4 mg, 4 mg, Intravenous, Q6H PRN, Elwin Mocha, MD .  sodium chloride flush (NS) 0.9 % injection 3 mL, 3 mL, Intravenous, Q12H, Elwin Mocha, MD  Objective:  Temp:  [97.7 F (36.5 C)-98.5 F (36.9 C)] 97.7 F (36.5 C) (11/14 0447) Pulse Rate:  [35-74] 74 (11/14 0447) BP: (111-123)/(53-87) 114/53 (11/14  0447) SpO2:  [97 %] 97 % (11/13 1434) Weight:  [67.5 kg (148 lb 12.8 oz)] 67.5 kg (148 lb 12.8 oz) (11/14 0447)  General: WDWN Caucasian man lying in bed in NAD. He was initially asleep but roused easily. Alert, oriented x4. Speech is clear without dysarthria. Affect is bright. Comportment is normal. He is hard of hearing even with his hearing aids in place.  HEENT: Neck is supple without lymphadenopathy. Mucous membranes are moist and the oropharynx is clear. Sclerae are anicteric. There is no conjunctival injection.  CV: Regular, no murmur. Carotid pulses are 2+ and symmetric with no bruits. Distal pulses 2+ and symmetric.  Lungs: CTAB  Extremities: No C/C/E. Neuro: MS: As noted above. No aphasia.  CN: Pupils are equal and reactive from 3-->2 mm bilaterally. Visual fields are full to confrontation. EOMI notable for some breakup of smooth pursuits in all directions of gaze. He has 2-3 beats of horizontal nystagmus on endgaze in both directions. Facial sensation is intact to light touch. Face is symmetric at rest with normal strength and mobility. Hearing is intact to conversational voice. Voice is normal in tone and quality. Palate elevates symmetrically. Uvula is midline. Bilateral SCM and trapezii are 5/5. Tongue is midline with normal bulk and mobility.  Motor: Normal bulk, tone, and strength throughout. No pronator drift. No tremor or other abnormal movements are observed.  Sensation: Intact to light touch. DTRs: 2+, symmetric. Toes are downgoing bilaterally. No pathological reflexes.  Coordination: Finger-to-nose and heel-to-shin are without dysmetria bilaterally.  Labs: Lab Results  Component Value Date   WBC 3.7 (L) 03/01/2016   HGB 12.4 (L) 03/01/2016   HCT 36.9 (L) 03/01/2016   PLT 112 (L) 03/01/2016   GLUCOSE 100 (H) 03/01/2016   CHOL 171 02/11/2014   TRIG 84 02/11/2014   HDL 39 (L) 02/11/2014   LDLCALC 115 (H) 02/11/2014   ALT 12 (L) 02/28/2016   AST 16 02/28/2016   NA 140  03/01/2016   K 4.6 03/01/2016   CL 111 03/01/2016   CREATININE 1.83 (H) 03/01/2016   BUN 42 (H) 03/01/2016   CO2 25 03/01/2016   TSH 3.567 02/28/2016   INR 1.18 03/26/2011   CBC Latest Ref Rng & Units 03/01/2016 02/29/2016 02/28/2016  WBC 4.0 - 10.5 K/uL 3.7(L) 4.5 4.8  Hemoglobin 13.0 - 17.0 g/dL 12.4(L) 12.9(L) 13.1  Hematocrit 39.0 - 52.0 % 36.9(L) 39.2 39.3  Platelets 150 - 400 K/uL 112(L) 119(L) 131(L)    No results found for: HGBA1C Lab Results  Component Value Date   ALT 12 (L) 02/28/2016   AST 16 02/28/2016   ALKPHOS 58 02/28/2016   BILITOT 1.2 02/28/2016    Radiology:  I have personally and independently reviewed the MRI scan of the brain without contrast from 02/29/16. There is no evidence of acute ischemia. Moderate severe chronic small vessel ischemic disease is noted in the bihemispheric white matter. Moderate diffuse generalized atrophy is present. There is hydrocephalus ex-vacuo.  I have personally and independently reviewed the MRA of the head without contrast from 02/29/16. This shows decreased flow in the basilar artery consistent with stenosis of this vessel. Both posterior cerebral arteries appear to have persistent fetal origins. The cerebellar arteries are also notable for decreased flow with nonvisualization of the AICAs and patchy narrowing of the PICAs and superior cerebellar arteries. There is a fusiform aneurysm at the bifurcation of the right MCA.  Other diagnostic studies:  EEG from 02/29/16 was normal.  A/P:   1. Syncope: His presentation is most consistent with a syncopal event. MRA does reveal narrowing of the basilar artery. I suspect his syncope was due to basilar insufficiency provoked by bradycardia and reduced cerebral perfusion. In the meantime, bradycardia and hypotension should be avoided to allow optimal cerebral perfusion in the setting of basilar stenosis.  2. Basilar artery stenosis: MRA shows moderate narrowing of the basilar artery. As  noted above, this likely contributed to his syncopal episode by exacerbating reduction in cerebral perfusion in the setting of bradycardia.   3. Right MCA aneurysm: This is an incidental finding on MRA. Recommend outpatient follow-up with neurointerventional radiology (Dr. Juliet Rude) for management recommendations.  4. Cerebrovascular disease: Imaging demonstrates moderate chronic small vessel ischemic change with no evidence of an acute stroke. His known risk factors for cerebrovascular disease include coronary artery disease, hypertension, hyperlipidemia, history of TIA, history of atrial fibrillation, and age. Continue with risk factor modification. As noted above, would avoid aggressive lowering of blood pressure to allow optimal cerebral perfusion in the setting of basilar stenosis. Continue aspirin, statin.  This was discussed with the patient and he is in agreement with the plan as noted. He was given the opportunity to ask any questions and these were addressed to his satisfaction. No family was present at the time of my visit.   I have no additional recommendations at this time and will sign off. Please call if any new issues arise.   Melba Coon, MD [facility]

## 2016-03-01 NOTE — Progress Notes (Signed)
  Echocardiogram 2D Echocardiogram has been performed.  Darlina Sicilian M 03/01/2016, 11:57 AM

## 2016-03-01 NOTE — Progress Notes (Signed)
PROGRESS NOTE    Richard Reed  [id number] DOB: [birth year] DOA: 02/28/2016 PCP: Tawanna Solo, MD   Outpatient Specialists:     Brief Narrative:  Richard Reed is a 82 y.o. male past medical history of coronary artery disease, asbestosis, COPD, CHF, cancer, CKD, poor hearing, hypertension, irregular heart rate presented to the emergency room with LOC. Patient states that he was in his usual health today when he went to church. He did skip breakfast. He was there for several hours and then while sitting in a pew began to feel odd. Patient states the next thing he knew he was surrounded by several individuals who were standing over him. Patient's wife states that while sitting in church she looked over and she noticed that her husband's head was back in his eyes were open. She thought initially was taking a nap but when she tried to rouse him he did not respond. The church's medical response team stimulated the patient, their exact reactions are unknown. However patient did come around after being unresponsive for about 5 minutes. No tongue biting and no loss of bowel or bladder, no shaking. EMS was activated and brought patient to the emergency room for evaluation.    Assessment & Plan:   Principal Problem:   LOC (loss of consciousness) (HCC) Active Problems:   Acute on chronic renal failure (HCC)   Thrombocytopenia (HCC)   UTI (urinary tract infection)   Syncope   LOC- ? Dehydration-- recently started on diuretic after being seen in [facility] hospital Patient was found not to be hypoglycemic concern for neurological event, patient does have a history of TIA. Cardiac is also a differential possibility of arrhythmia, patient was bradycardic on presentation Holding coreg ECHO-only study left for d/c Tele Neurology consulted- MRI, EEG, carotid -MRA: MRA shows moderate narrowing of the basilar artery. As noted above, this likely contributed to his syncopal episode by exacerbating  reduction in cerebral perfusion in the setting of bradycardia.  Outpatient follow up with Dr. Estanislado Pandy outpatient event monitor  UTI - doubt UCX multiple colonies D/c rocephin  CHF- at outside hospital Echo PENDING Holding meds  AKI Baseline Cr 1.48 (2016), Cr on admit 2.04 Looks prerenal -IVF  Low Plts Chronic No overt bleed Will monitor  Hypertension When necessary hydralazine 10 mg IV as needed for severe blood pressure  COPD?-- will need PFTs Cont dulera Prn albuterol  Hx of CVA Cont 81mg  ASA   DVT prophylaxis:  Lovenox   Code Status: Full Code   Family Communication: Daughter at bedside  Disposition Plan:  Home health PT Home once echo done   Consultants:   neurp     Subjective: Up walking laps with PT  Objective: Vitals:   02/29/16 1624 02/29/16 2110 02/29/16 2147 03/01/16 0447  BP: (!) 119/57 123/87  (!) 114/53  Pulse:    74  Resp:      Temp:   97.7 F (36.5 C) 97.7 F (36.5 C)  TempSrc:    Oral  SpO2:      Weight:    67.5 kg (148 lb 12.8 oz)  Height:        Intake/Output Summary (Last 24 hours) at 03/01/16 1051 Last data filed at 03/01/16 0300  Gross per 24 hour  Intake          2151.33 ml  Output                0 ml  Net  2151.33 ml   Filed Weights   02/28/16 1730 02/29/16 0450 03/01/16 0447  Weight: 66 kg (145 lb 8 oz) 66.9 kg (147 lb 8 oz) 67.5 kg (148 lb 12.8 oz)    Examination:  General exam: Appears calm and comfortable-- hard of hearing Respiratory system: Clear to auscultation. Respiratory effort normal. Cardiovascular system: S1 & S2 heard, RRR. No JVD, murmurs, rubs, gallops or clicks. No pedal edema. Gastrointestinal system: Abdomen is nondistended, soft and nontender. No organomegaly or masses felt. Normal bowel sounds heard. Central nervous system: Alert and oriented. No focal neurological deficits.     Data Reviewed: I have personally reviewed following labs and imaging  studies  CBC:  Recent Labs Lab 02/28/16 1307 02/29/16 0432 03/01/16 0324  WBC 4.8 4.5 3.7*  NEUTROABS 3.6  --   --   HGB 13.1 12.9* 12.4*  HCT 39.3 39.2 36.9*  MCV 88.7 89.1 88.9  PLT 131* 119* [id number]*   Basic Metabolic Panel:  Recent Labs Lab 02/28/16 1307 02/28/16 1713 02/29/16 0432 03/01/16 0324  NA 136  --  140 140  K 5.0  --  5.4* 4.6  CL 104  --  106 111  CO2 24  --  26 25  GLUCOSE 94  --  86 100*  BUN 59*  --  53* 42*  CREATININE 2.04*  --  2.05* 1.83*  CALCIUM 8.8*  --  8.9 8.3*  MG  --  2.4  --   --   PHOS  --  4.5  --   --    GFR: Estimated Creatinine Clearance (by C-G formula based on SCr of 1.83 mg/dL (H)) Male: 21.4 mL/min Male: 26.6 mL/min Liver Function Tests:  Recent Labs Lab 02/28/16 1307  AST 16  ALT 12*  ALKPHOS 58  BILITOT 1.2  PROT 5.9*  ALBUMIN 3.4*   No results for input(s): LIPASE, AMYLASE in the last 168 hours. No results for input(s): AMMONIA in the last 168 hours. Coagulation Profile: No results for input(s): INR, PROTIME in the last 168 hours. Cardiac Enzymes:  Recent Labs Lab 02/28/16 1307 02/28/16 1713  TROPONINI <0.03 <0.03   BNP (last 3 results) No results for input(s): PROBNP in the last 8760 hours. HbA1C: No results for input(s): HGBA1C in the last 72 hours. CBG: No results for input(s): GLUCAP in the last 168 hours. Lipid Profile: No results for input(s): CHOL, HDL, LDLCALC, TRIG, CHOLHDL, LDLDIRECT in the last 72 hours. Thyroid Function Tests:  Recent Labs  02/28/16 1307  TSH 3.567   Anemia Panel: No results for input(s): VITAMINB12, FOLATE, FERRITIN, TIBC, IRON, RETICCTPCT in the last 72 hours. Urine analysis:    Component Value Date/Time   COLORURINE YELLOW 02/28/2016 1530   APPEARANCEUR CLOUDY (A) 02/28/2016 1530   LABSPEC 1.009 02/28/2016 1530   PHURINE 5.0 02/28/2016 1530   GLUCOSEU NEGATIVE 02/28/2016 1530   HGBUR TRACE (A) 02/28/2016 1530   BILIRUBINUR NEGATIVE 02/28/2016 1530    BILIRUBINUR neg 07/17/2012 1436   KETONESUR NEGATIVE 02/28/2016 1530   PROTEINUR NEGATIVE 02/28/2016 1530   UROBILINOGEN 0.2 07/17/2012 1436   UROBILINOGEN 1.0 04/13/2011 0220   NITRITE NEGATIVE 02/28/2016 1530   LEUKOCYTESUR MODERATE (A) 02/28/2016 1530     ) Recent Results (from the past 240 hour(s))  Culture, Urine     Status: Abnormal   Collection Time: 02/28/16  3:28 PM  Result Value Ref Range Status   Specimen Description URINE, RANDOM  Final   Special Requests NONE  Final  Culture MULTIPLE SPECIES PRESENT, SUGGEST RECOLLECTION (A)  Final   Report Status 02/29/2016 FINAL  Final      Anti-infectives    Start     Dose/Rate Route Frequency Ordered Stop   02/28/16 1800  cefTRIAXone (ROCEPHIN) 1 g in dextrose 5 % 50 mL IVPB     1 g 100 mL/hr over 30 Minutes Intravenous Every 24 hours 02/28/16 1747         Radiology Studies: Ct Head Wo Contrast  Result Date: 02/28/2016 CLINICAL DATA:  Acute onset of loss of consciousness. Initial encounter. EXAM: CT HEAD WITHOUT CONTRAST TECHNIQUE: Contiguous axial images were obtained from the base of the skull through the vertex without intravenous contrast. COMPARISON:  CT of the head performed 03/23/2011 FINDINGS: Brain: No evidence of acute infarction, hemorrhage, hydrocephalus, extra-axial collection or mass lesion/mass effect. Prominence of the ventricles and sulci reflects mild to moderate cortical volume loss. Cerebellar atrophy is noted. Mild periventricular white matter change likely reflects small vessel ischemic microangiopathy. Mild chronic ischemic change is noted at the left basal ganglia. The brainstem and fourth ventricle are within normal limits. The cerebral hemispheres demonstrate grossly normal gray-white differentiation. No mass effect or midline shift is seen. Vascular: No hyperdense vessel or unexpected calcification. Skull: There is no evidence of fracture; visualized osseous structures are unremarkable in appearance.  Sinuses/Orbits: The orbits are within normal limits. A mucus retention cyst or polyp is noted at the left frontal sinus. The remaining paranasal sinuses and left mastoid air cells are well-aerated. There is partial opacification of the right mastoid air cells. Other: No significant soft tissue abnormalities are seen. IMPRESSION: 1. No acute intracranial pathology seen on CT. 2. Mild to moderate cortical volume loss and scattered small vessel ischemic microangiopathy. 3. Mild chronic ischemic change at the left basal ganglia. 4. Mucus retention cyst or polyp at the left frontal sinus. Partial opacification of the right mastoid air cells. Electronically Signed   By: Garald Balding M.D.   On: 02/28/2016 23:47   Mr Brain Wo Contrast  Result Date: 02/29/2016 CLINICAL DATA:  82 year old with loss of consciousness yesterday. Subsequent encounter. EXAM: MRI HEAD WITHOUT CONTRAST MRA HEAD WITHOUT CONTRAST TECHNIQUE: Multiplanar, multiecho pulse sequences of the brain and surrounding structures were obtained without intravenous contrast. Angiographic images of the head were obtained using MRA technique without contrast. COMPARISON:  02/28/2016 head CT. 10/11/2014 and 02/21/2014 brain MR. FINDINGS: MRI HEAD FINDINGS Brain: No acute infarct or intracranial hemorrhage. Moderate chronic microvascular changes. Global atrophy without hydrocephalus. No intracranial mass lesion noted on this unenhanced exam. Vascular: As below. Skull and upper cervical spine: Cervical spondylotic changes with spinal stenosis and cord flattening C3-4 and C4-5. Cervical spine MR can be obtained for further delineation if clinically desired. Sinuses/Orbits: No acute orbital abnormality post lens replacement. Polypoid opacification left frontal -ethmoid junction. Mild mucosal thickening ethmoid sinus air cells. Minimal mucosal thickening maxillary sinuses. Prominent mucosal thickening middle and inferior turbinates bilaterally. Other: Negative MRA  HEAD FINDINGS Marked long segment narrowing basilar artery. Vertebral artery is not visualized after takeoff of the posterior inferior cerebellar artery bilaterally. Moderate narrowing of portions of the posterior inferior cerebellar artery bilaterally. Nonvisualized anterior inferior cerebellar arteries bilaterally. Moderate narrowing superior cerebellar arteries bilaterally. 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 IMPRESSION: MRI HEAD No acute infarct or intracranial hemorrhage. Moderate chronic microvascular changes. Global atrophy without hydrocephalus. Cervical spondylotic changes with spinal stenosis and cord flattening C3-4 and C4-5.  Cervical spine MR can be obtained for further delineation if clinically desired. Polypoid opacification left frontal -ethmoid junction. Mild mucosal thickening ethmoid sinus air cells. Minimal mucosal thickening maxillary sinuses. Prominent mucosal thickening middle and inferior turbinates bilaterally. MRA HEAD Marked long segment narrowing basilar artery. Neither vertebral artery is not visualized after takeoff of the posterior inferior cerebellar artery. Moderate narrowing of portions of the posterior inferior cerebellar artery bilaterally. Nonvisualized anterior inferior cerebellar arteries bilaterally. Moderate narrowing superior cerebellar arteries bilaterally. 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 Electronically Signed   By: Genia Del M.D.   On: 02/29/2016 11:13   Dg Chest Portable 1 View  Result Date: 02/28/2016 CLINICAL DATA:  Syncopal episode with loss of consciousness. EXAM: PORTABLE CHEST 1 VIEW COMPARISON:  03/28/2011 FINDINGS: Stable top-normal heart size and ectasia of the thoracic aorta. Bibasilar scarring and atelectasis present. Calcification in the left hemithorax  likely relates to calcify pleural plaque. There is additional calcified pleural plaque along the right diaphragmatic surface. There is no evidence of pulmonary edema, consolidation, pneumothorax, nodule or pleural fluid. The visualized skeletal structures are unremarkable. IMPRESSION: Bibasilar scarring and atelectasis. Bilateral calcified pleural plaque. Electronically Signed   By: Aletta Edouard M.D.   On: 02/28/2016 12:58   Mr Lovenia Kim  Result Date: 02/29/2016 CLINICAL DATA:  81 year old with loss of consciousness yesterday. Subsequent encounter. EXAM: MRI HEAD WITHOUT CONTRAST MRA HEAD WITHOUT CONTRAST TECHNIQUE: Multiplanar, multiecho pulse sequences of the brain and surrounding structures were obtained without intravenous contrast. Angiographic images of the head were obtained using MRA technique without contrast. COMPARISON:  02/28/2016 head CT. 10/11/2014 and 02/21/2014 brain MR. FINDINGS: MRI HEAD FINDINGS Brain: No acute infarct or intracranial hemorrhage. Moderate chronic microvascular changes. Global atrophy without hydrocephalus. No intracranial mass lesion noted on this unenhanced exam. Vascular: As below. Skull and upper cervical spine: Cervical spondylotic changes with spinal stenosis and cord flattening C3-4 and C4-5. Cervical spine MR can be obtained for further delineation if clinically desired. Sinuses/Orbits: No acute orbital abnormality post lens replacement. Polypoid opacification left frontal -ethmoid junction. Mild mucosal thickening ethmoid sinus air cells. Minimal mucosal thickening maxillary sinuses. Prominent mucosal thickening middle and inferior turbinates bilaterally. Other: Negative MRA HEAD FINDINGS Marked long segment narrowing basilar artery. Vertebral artery is not visualized after takeoff of the posterior inferior cerebellar artery bilaterally. Moderate narrowing of portions of the posterior inferior cerebellar artery bilaterally. Nonvisualized anterior inferior  cerebellar arteries bilaterally. Moderate narrowing superior cerebellar arteries bilaterally. 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 IMPRESSION: MRI HEAD No acute infarct or intracranial hemorrhage. Moderate chronic microvascular changes. Global atrophy without hydrocephalus. Cervical spondylotic changes with spinal stenosis and cord flattening C3-4 and C4-5. Cervical spine MR can be obtained for further delineation if clinically desired. Polypoid opacification left frontal -ethmoid junction. Mild mucosal thickening ethmoid sinus air cells. Minimal mucosal thickening maxillary sinuses. Prominent mucosal thickening middle and inferior turbinates bilaterally. MRA HEAD Marked long segment narrowing basilar artery. Neither vertebral artery is not visualized after takeoff of the posterior inferior cerebellar artery. Moderate narrowing of portions of the posterior inferior cerebellar artery bilaterally. Nonvisualized anterior inferior cerebellar arteries bilaterally. Moderate narrowing superior cerebellar arteries bilaterally. 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 Electronically Signed   By: Genia Del M.D.   On: 02/29/2016 11:13        Scheduled Meds: .  aspirin EC  325 mg Oral Daily  . cefTRIAXone (ROCEPHIN)  IV  1 g Intravenous Q24H  . enoxaparin (LOVENOX) injection  30 mg Subcutaneous Q24H  . feeding supplement (ENSURE ENLIVE)  237 mL Oral BID BM  . mometasone-formoterol  2 puff Inhalation BID  . sodium chloride flush  3 mL Intravenous Q12H   Continuous Infusions: . sodium chloride 100 mL/hr at 03/01/16 0822     LOS: 1 day    Time spent: 25 min    [place], DO [facility] Pager [phone]  If 7PM-7AM, please contact night-coverage [email or web address] Password [email or web address] 03/01/2016, 10:51 AM

## 2016-03-01 NOTE — Discharge Summary (Signed)
Physician Discharge Summary  Richard Reed [id number] DOB: 1926/03/01 DOA: 02/28/2016  PCP: Tawanna Solo, MD  Admit date: 02/28/2016 Discharge date: 03/01/2016   Recommendations for Outpatient Follow-Up:   1. Outpatient referral to pulm-- had CT scan in [place] that showed pleural based plaques-- started on dulera 2. Cardiology follow up 3. Weigh daily   Discharge Diagnosis:   Principal Problem:   LOC (loss of consciousness) ([place]) Active Problems:   Acute on chronic renal failure (HCC)   Thrombocytopenia (HCC)   UTI (urinary tract infection)   Syncope   Discharge disposition:  Home.  Discharge Condition: Improved.  Diet recommendation: Low sodium, heart healthy.  Wound care: None.   History of Present Illness:   Richard Reed is a 82 y.o. male past medical history of coronary artery disease, asbestosis, COPD, CHF, cancer, CKD, poor hearing, hypertension, irregular heart rate presented to the emergency room with LOC. Patient states that he was in his usual health today when he went to church. He did skip breakfast. He was there for several hours and then while sitting in a pew began to feel odd. Patient states the next thing he knew he was surrounded by several individuals who were standing over him. Patient's wife states that while sitting in church she looked over and she noticed that her husband's head was back in his eyes were open. She thought initially was taking a nap but when she tried to rouse him he did not respond. The church's medical response team stimulated the patient, their exact reactions are unknown. However patient did come around after being unresponsive for about 5 minutes. No tongue biting and no loss of bowel or bladder, no shaking. EMS was activated and brought patient to the emergency room for evaluation.   Hospital Course by Problem:   Syncope:  - presentation is most consistent with a syncopal event. MRA does reveal narrowing of the basilar  artery. -suspect his syncope was due to basilar insufficiency provoked by bradycardia and reduced cerebral perfusion. -bradycardia and hypotension should be avoided to allow optimal cerebral perfusion in the setting of basilar stenosis.  Basilar artery stenosis: MRA shows moderate narrowing of the basilar artery. As noted above, this likely contributed to his syncopal episode by exacerbating reduction in cerebral perfusion in the setting of bradycardia.   Right MCA aneurysm: This is an incidental finding on MRA. Recommend outpatient follow-up with neurointerventional radiology (Dr. Juliet Rude) for management recommendations.  Cerebrovascular disease on MRI:  - avoid aggressive lowering of blood pressure to allow optimal cerebral perfusion in the setting of basilar stenosis. Continue aspirin, statin.  Grade 1 diastolic dysfunction with mildly reduced EF -outpatient cardiology follow up -may need ACE added once BP improves and CR stable -lasix PRN -patient to weigh daily    Medical Consultants:    Neuro   Discharge Exam:   Vitals:   02/29/16 2147 03/01/16 0447  BP:  (!) 114/53  Pulse:  74  Resp:    Temp: 97.7 F (36.5 C) 97.7 F (36.5 C)   Vitals:   02/29/16 1624 02/29/16 2110 02/29/16 2147 03/01/16 0447  BP: (!) 119/57 123/87  (!) 114/53  Pulse:    74  Resp:      Temp:   97.7 F (36.5 C) 97.7 F (36.5 C)  TempSrc:    Oral  SpO2:      Weight:    67.5 kg (148 lb 12.8 oz)  Height:  The results of significant diagnostics from this hospitalization (including imaging, microbiology, ancillary and laboratory) are listed below for reference.     Procedures and Diagnostic Studies:   Ct Head Wo Contrast  Result Date: 02/28/2016 CLINICAL DATA:  Acute onset of loss of consciousness. Initial encounter. EXAM: CT HEAD WITHOUT CONTRAST TECHNIQUE: Contiguous axial images were obtained from the base of the skull through the vertex without intravenous contrast.  COMPARISON:  CT of the head performed 03/23/2011 FINDINGS: Brain: No evidence of acute infarction, hemorrhage, hydrocephalus, extra-axial collection or mass lesion/mass effect. Prominence of the ventricles and sulci reflects mild to moderate cortical volume loss. Cerebellar atrophy is noted. Mild periventricular white matter change likely reflects small vessel ischemic microangiopathy. Mild chronic ischemic change is noted at the left basal ganglia. The brainstem and fourth ventricle are within normal limits. The cerebral hemispheres demonstrate grossly normal gray-white differentiation. No mass effect or midline shift is seen. Vascular: No hyperdense vessel or unexpected calcification. Skull: There is no evidence of fracture; visualized osseous structures are unremarkable in appearance. Sinuses/Orbits: The orbits are within normal limits. A mucus retention cyst or polyp is noted at the left frontal sinus. The remaining paranasal sinuses and left mastoid air cells are well-aerated. There is partial opacification of the right mastoid air cells. Other: No significant soft tissue abnormalities are seen. IMPRESSION: 1. No acute intracranial pathology seen on CT. 2. Mild to moderate cortical volume loss and scattered small vessel ischemic microangiopathy. 3. Mild chronic ischemic change at the left basal ganglia. 4. Mucus retention cyst or polyp at the left frontal sinus. Partial opacification of the right mastoid air cells. Electronically Signed   By: Garald Balding M.D.   On: 02/28/2016 23:47   Mr Brain Wo Contrast  Result Date: 02/29/2016 CLINICAL DATA:  82 year old with loss of consciousness yesterday. Subsequent encounter. EXAM: MRI HEAD WITHOUT CONTRAST MRA HEAD WITHOUT CONTRAST TECHNIQUE: Multiplanar, multiecho pulse sequences of the brain and surrounding structures were obtained without intravenous contrast. Angiographic images of the head were obtained using MRA technique without contrast. COMPARISON:   02/28/2016 head CT. 10/11/2014 and 02/21/2014 brain MR. FINDINGS: MRI HEAD FINDINGS Brain: No acute infarct or intracranial hemorrhage. Moderate chronic microvascular changes. Global atrophy without hydrocephalus. No intracranial mass lesion noted on this unenhanced exam. Vascular: As below. Skull and upper cervical spine: Cervical spondylotic changes with spinal stenosis and cord flattening C3-4 and C4-5. Cervical spine MR can be obtained for further delineation if clinically desired. Sinuses/Orbits: No acute orbital abnormality post lens replacement. Polypoid opacification left frontal -ethmoid junction. Mild mucosal thickening ethmoid sinus air cells. Minimal mucosal thickening maxillary sinuses. Prominent mucosal thickening middle and inferior turbinates bilaterally. Other: Negative MRA HEAD FINDINGS Marked long segment narrowing basilar artery. Vertebral artery is not visualized after takeoff of the posterior inferior cerebellar artery bilaterally. Moderate narrowing of portions of the posterior inferior cerebellar artery bilaterally. Nonvisualized anterior inferior cerebellar arteries bilaterally. Moderate narrowing superior cerebellar arteries bilaterally. 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 IMPRESSION: MRI HEAD No acute infarct or intracranial hemorrhage. Moderate chronic microvascular changes. Global atrophy without hydrocephalus. Cervical spondylotic changes with spinal stenosis and cord flattening C3-4 and C4-5. Cervical spine MR can be obtained for further delineation if clinically desired. Polypoid opacification left frontal -ethmoid junction. Mild mucosal thickening ethmoid sinus air cells. Minimal mucosal thickening maxillary sinuses. Prominent mucosal thickening middle and inferior turbinates bilaterally. MRA HEAD Marked long segment narrowing basilar artery. Neither vertebral artery is  not visualized  after takeoff of the posterior inferior cerebellar artery. Moderate narrowing of portions of the posterior inferior cerebellar artery bilaterally. Nonvisualized anterior inferior cerebellar arteries bilaterally. Moderate narrowing superior cerebellar arteries bilaterally. 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 Electronically Signed   By: Genia Del M.D.   On: 02/29/2016 11:13   Dg Chest Portable 1 View  Result Date: 02/28/2016 CLINICAL DATA:  Syncopal episode with loss of consciousness. EXAM: PORTABLE CHEST 1 VIEW COMPARISON:  03/28/2011 FINDINGS: Stable top-normal heart size and ectasia of the thoracic aorta. Bibasilar scarring and atelectasis present. Calcification in the left hemithorax likely relates to calcify pleural plaque. There is additional calcified pleural plaque along the right diaphragmatic surface. There is no evidence of pulmonary edema, consolidation, pneumothorax, nodule or pleural fluid. The visualized skeletal structures are unremarkable. IMPRESSION: Bibasilar scarring and atelectasis. Bilateral calcified pleural plaque. Electronically Signed   By: Aletta Edouard M.D.   On: 02/28/2016 12:58   Mr Lovenia Kim  Result Date: 02/29/2016 CLINICAL DATA:  82 year old with loss of consciousness yesterday. Subsequent encounter. EXAM: MRI HEAD WITHOUT CONTRAST MRA HEAD WITHOUT CONTRAST TECHNIQUE: Multiplanar, multiecho pulse sequences of the brain and surrounding structures were obtained without intravenous contrast. Angiographic images of the head were obtained using MRA technique without contrast. COMPARISON:  02/28/2016 head CT. 10/11/2014 and 02/21/2014 brain MR. FINDINGS: MRI HEAD FINDINGS Brain: No acute infarct or intracranial hemorrhage. Moderate chronic microvascular changes. Global atrophy without hydrocephalus. No intracranial mass lesion noted on this unenhanced exam. Vascular: As below.  Skull and upper cervical spine: Cervical spondylotic changes with spinal stenosis and cord flattening C3-4 and C4-5. Cervical spine MR can be obtained for further delineation if clinically desired. Sinuses/Orbits: No acute orbital abnormality post lens replacement. Polypoid opacification left frontal -ethmoid junction. Mild mucosal thickening ethmoid sinus air cells. Minimal mucosal thickening maxillary sinuses. Prominent mucosal thickening middle and inferior turbinates bilaterally. Other: Negative MRA HEAD FINDINGS Marked long segment narrowing basilar artery. Vertebral artery is not visualized after takeoff of the posterior inferior cerebellar artery bilaterally. Moderate narrowing of portions of the posterior inferior cerebellar artery bilaterally. Nonvisualized anterior inferior cerebellar arteries bilaterally. Moderate narrowing superior cerebellar arteries bilaterally. 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 IMPRESSION: MRI HEAD No acute infarct or intracranial hemorrhage. Moderate chronic microvascular changes. Global atrophy without hydrocephalus. Cervical spondylotic changes with spinal stenosis and cord flattening C3-4 and C4-5. Cervical spine MR can be obtained for further delineation if clinically desired. Polypoid opacification left frontal -ethmoid junction. Mild mucosal thickening ethmoid sinus air cells. Minimal mucosal thickening maxillary sinuses. Prominent mucosal thickening middle and inferior turbinates bilaterally. MRA HEAD Marked long segment narrowing basilar artery. Neither vertebral artery is not visualized after takeoff of the posterior inferior cerebellar artery. Moderate narrowing of portions of the posterior inferior cerebellar artery bilaterally. Nonvisualized anterior inferior cerebellar arteries bilaterally. Moderate narrowing superior cerebellar arteries bilaterally. Fetal contribution to  the posterior cerebral arteries bilaterally Fusiform bilobed aneurysm right middle cerebral artery bifurcation. Electronically Signed   By: Genia Del M.D.   On: 02/29/2016 11:13     Labs:   Basic Metabolic Panel:  Recent Labs Lab 02/28/16 1307 02/28/16 1713 02/29/16 0432 03/01/16 0324  NA 136  --  140 140  K 5.0  --  5.4* 4.6  CL 104  --  106 111  CO2 24  --  26 25  GLUCOSE 94  --  86 100*  BUN 59*  --  53* 42*  CREATININE 2.04*  --  2.05* 1.83*  CALCIUM 8.8*  --  8.9 8.3*  MG  --  2.4  --   --   PHOS  --  4.5  --   --    GFR Estimated Creatinine Clearance (by C-G formula based on SCr of 1.83 mg/dL (H)) Male: 21.4 mL/min Male: 26.6 mL/min Liver Function Tests:  Recent Labs Lab 02/28/16 1307  AST 16  ALT 12*  ALKPHOS 58  BILITOT 1.2  PROT 5.9*  ALBUMIN 3.4*   No results for input(s): LIPASE, AMYLASE in the last 168 hours. No results for input(s): AMMONIA in the last 168 hours. Coagulation profile No results for input(s): INR, PROTIME in the last 168 hours.  CBC:  Recent Labs Lab 02/28/16 1307 02/29/16 0432 03/01/16 0324  WBC 4.8 4.5 3.7*  NEUTROABS 3.6  --   --   HGB 13.1 12.9* 12.4*  HCT 39.3 39.2 36.9*  MCV 88.7 89.1 88.9  PLT 131* 119* 112*   Cardiac Enzymes:  Recent Labs Lab 02/28/16 1307 02/28/16 1713  TROPONINI <0.03 <0.03   BNP: Invalid input(s): POCBNP CBG: No results for input(s): GLUCAP in the last 168 hours. D-Dimer No results for input(s): DDIMER in the last 72 hours. Hgb A1c No results for input(s): HGBA1C in the last 72 hours. Lipid Profile No results for input(s): CHOL, HDL, LDLCALC, TRIG, CHOLHDL, LDLDIRECT in the last 72 hours. Thyroid function studies  Recent Labs  02/28/16 1307  TSH 3.567   Anemia work up No results for input(s): VITAMINB12, FOLATE, FERRITIN, TIBC, IRON, RETICCTPCT in the last 72 hours. Microbiology Recent Results (from the past 240 hour(s))  Culture, Urine     Status: Abnormal    Collection Time: 02/28/16  3:28 PM  Result Value Ref Range Status   Specimen Description URINE, RANDOM  Final   Special Requests NONE  Final   Culture MULTIPLE SPECIES PRESENT, SUGGEST RECOLLECTION (A)  Final   Report Status 02/29/2016 FINAL  Final     Discharge Instructions:   Discharge Instructions    (HEART FAILURE PATIENTS) Call MD:  Anytime you have any of the following symptoms: 1) 3 pound weight gain in 24 hours or 5 pounds in 1 week 2) shortness of breath, with or without a dry hacking cough 3) swelling in the hands, feet or stomach 4) if you have to sleep on extra pillows at night in order to breathe.    Complete by:  As directed    Diet - low sodium heart healthy    Complete by:  As directed    Discharge instructions    Complete by:  As directed    Avoid hypotension (low BP) and bradycardia Supervision by family BMP at next office visit re: CR Outpatient follow up with pulm-- Re: diagnosis in [place], PFTs   Increase activity slowly    Complete by:  As directed        Medication List    STOP taking these medications   carvedilol 12.5 MG tablet Commonly known as:  COREG   lisinopril 5 MG tablet Commonly known as:  PRINIVIL,ZESTRIL   metoprolol tartrate 25 MG tablet Commonly known as:  LOPRESSOR   spironolactone 25 MG tablet Commonly known as:  ALDACTONE     TAKE these medications   aspirin EC 81 MG tablet Take 81 mg by mouth daily.   Calcium 500 MG Chew Chew by mouth daily.  cyanocobalamin 500 MCG tablet Take 500 mcg by mouth daily.   DULERA 200-5 MCG/ACT Aero Generic drug:  mometasone-formoterol Take 2 puffs by mouth 2 (two) times daily.   feeding supplement (ENSURE ENLIVE) Liqd Take 237 mLs by mouth 2 (two) times daily between meals. Start taking on:  03/02/2016   fish oil-omega-3 fatty acids 1000 MG capsule Take 1 g by mouth daily.   furosemide 20 MG tablet Commonly known as:  LASIX Take 1 tablet (20 mg total) by mouth daily as  needed. What changed:  how much to take  when to take this  reasons to take this   multivitamin tablet Take 1 tablet by mouth daily.   rosuvastatin 10 MG tablet Commonly known as:  CRESTOR Take 1 tablet (10 mg total) by mouth daily.      Follow-up Information    DEVESHWAR, Fritz Pickerel, MD Follow up.   Specialty:  Interventional Radiology Why:  MRA findings follow up per neuro Contact information: [address] [place] [address] [phone]        Quay Burow, MD. Go on 03/08/2016.   Specialties:  Cardiology, Radiology Why:  hospital follow up 03/08/2016 with Ignacia Bayley at 9:30 AM Contact information: [address] [place] [address] [phone]        Tawanna Solo, MD Follow up in 2 day(s).   Specialty:  Family Medicine Why:  BMP to follow up Cr--- has appointment on Thursaday at 2 PM Contact information: [place] [address] [phone]            Time coordinating discharge: 35 min  Signed:  [place]   [facility] 03/01/2016, 1:53 PM

## 2016-03-02 DIAGNOSIS — I480 Paroxysmal atrial fibrillation: Secondary | ICD-10-CM | POA: Diagnosis not present

## 2016-03-02 DIAGNOSIS — N189 Chronic kidney disease, unspecified: Secondary | ICD-10-CM | POA: Diagnosis not present

## 2016-03-02 DIAGNOSIS — J449 Chronic obstructive pulmonary disease, unspecified: Secondary | ICD-10-CM | POA: Diagnosis not present

## 2016-03-02 DIAGNOSIS — I130 Hypertensive heart and chronic kidney disease with heart failure and stage 1 through stage 4 chronic kidney disease, or unspecified chronic kidney disease: Secondary | ICD-10-CM | POA: Diagnosis not present

## 2016-03-02 DIAGNOSIS — R2689 Other abnormalities of gait and mobility: Secondary | ICD-10-CM | POA: Diagnosis not present

## 2016-03-02 DIAGNOSIS — I69398 Other sequelae of cerebral infarction: Secondary | ICD-10-CM | POA: Diagnosis not present

## 2016-03-02 DIAGNOSIS — D696 Thrombocytopenia, unspecified: Secondary | ICD-10-CM | POA: Diagnosis not present

## 2016-03-02 DIAGNOSIS — I509 Heart failure, unspecified: Secondary | ICD-10-CM | POA: Diagnosis not present

## 2016-03-02 DIAGNOSIS — M6281 Muscle weakness (generalized): Secondary | ICD-10-CM | POA: Diagnosis not present

## 2016-03-02 DIAGNOSIS — I2510 Atherosclerotic heart disease of native coronary artery without angina pectoris: Secondary | ICD-10-CM | POA: Diagnosis not present

## 2016-03-03 ENCOUNTER — Telehealth: Payer: Self-pay | Admitting: Cardiovascular Disease

## 2016-03-03 DIAGNOSIS — N185 Chronic kidney disease, stage 5: Secondary | ICD-10-CM | POA: Diagnosis not present

## 2016-03-03 DIAGNOSIS — I5032 Chronic diastolic (congestive) heart failure: Secondary | ICD-10-CM | POA: Diagnosis not present

## 2016-03-03 DIAGNOSIS — J948 Other specified pleural conditions: Secondary | ICD-10-CM | POA: Diagnosis not present

## 2016-03-03 DIAGNOSIS — E7800 Pure hypercholesterolemia, unspecified: Secondary | ICD-10-CM | POA: Diagnosis not present

## 2016-03-03 DIAGNOSIS — I10 Essential (primary) hypertension: Secondary | ICD-10-CM | POA: Diagnosis not present

## 2016-03-03 DIAGNOSIS — Z8673 Personal history of transient ischemic attack (TIA), and cerebral infarction without residual deficits: Secondary | ICD-10-CM | POA: Diagnosis not present

## 2016-03-03 DIAGNOSIS — I671 Cerebral aneurysm, nonruptured: Secondary | ICD-10-CM | POA: Diagnosis not present

## 2016-03-03 DIAGNOSIS — E785 Hyperlipidemia, unspecified: Secondary | ICD-10-CM

## 2016-03-03 NOTE — Telephone Encounter (Signed)
New Message  Pt wife call requesting to speak wit RN about getting a fast blood count complete during appt on 11/21 per pt PCP. Please call back to discuss

## 2016-03-03 NOTE — Telephone Encounter (Signed)
Spoke with wife she states that on discharge paperwork I says for pt to resume crestor, pt has never taken crestor. And she doesn't know where that came from will discuss at appointment and will have pt come in fasting for Lipid pan and whatever labs are ordered at appointment

## 2016-03-06 LAB — VAS US CAROTID
LEFT ECA DIAS: -5 cm/s
LEFT VERTEBRAL DIAS: -7 cm/s
Left CCA dist dias: 20 cm/s
Left CCA dist sys: 84 cm/s
Left CCA prox dias: -13 cm/s
Left CCA prox sys: -106 cm/s
Left ICA dist dias: -30 cm/s
Left ICA dist sys: -111 cm/s
Left ICA prox dias: 27 cm/s
Left ICA prox sys: 104 cm/s
RIGHT ECA DIAS: -7 cm/s
RIGHT VERTEBRAL DIAS: -13 cm/s
Right CCA prox dias: 22 cm/s
Right CCA prox sys: 103 cm/s
Right cca dist sys: -84 cm/s

## 2016-03-08 ENCOUNTER — Encounter (INDEPENDENT_AMBULATORY_CARE_PROVIDER_SITE_OTHER): Payer: Medicare Other

## 2016-03-08 ENCOUNTER — Other Ambulatory Visit (HOSPITAL_COMMUNITY): Payer: Self-pay | Admitting: Interventional Radiology

## 2016-03-08 ENCOUNTER — Encounter: Payer: Self-pay | Admitting: Nurse Practitioner

## 2016-03-08 ENCOUNTER — Ambulatory Visit (INDEPENDENT_AMBULATORY_CARE_PROVIDER_SITE_OTHER): Payer: Medicare Other | Admitting: Nurse Practitioner

## 2016-03-08 VITALS — BP 116/68 | HR 88 | Ht 68.0 in | Wt 146.4 lb

## 2016-03-08 DIAGNOSIS — I480 Paroxysmal atrial fibrillation: Secondary | ICD-10-CM

## 2016-03-08 DIAGNOSIS — I429 Cardiomyopathy, unspecified: Secondary | ICD-10-CM

## 2016-03-08 DIAGNOSIS — I493 Ventricular premature depolarization: Secondary | ICD-10-CM | POA: Diagnosis not present

## 2016-03-08 DIAGNOSIS — I5022 Chronic systolic (congestive) heart failure: Secondary | ICD-10-CM

## 2016-03-08 DIAGNOSIS — N183 Chronic kidney disease, stage 3 unspecified: Secondary | ICD-10-CM

## 2016-03-08 DIAGNOSIS — I729 Aneurysm of unspecified site: Secondary | ICD-10-CM

## 2016-03-08 DIAGNOSIS — R55 Syncope and collapse: Secondary | ICD-10-CM

## 2016-03-08 DIAGNOSIS — E785 Hyperlipidemia, unspecified: Secondary | ICD-10-CM

## 2016-03-08 DIAGNOSIS — Z79899 Other long term (current) drug therapy: Secondary | ICD-10-CM | POA: Diagnosis not present

## 2016-03-08 DIAGNOSIS — I10 Essential (primary) hypertension: Secondary | ICD-10-CM

## 2016-03-08 LAB — BASIC METABOLIC PANEL
BUN: 39 mg/dL — ABNORMAL HIGH (ref 7–25)
CALCIUM: 9.3 mg/dL (ref 8.6–10.3)
CO2: 28 mmol/L (ref 20–31)
CREATININE: 1.61 mg/dL — AB (ref 0.70–1.11)
Chloride: 100 mmol/L (ref 98–110)
Glucose, Bld: 97 mg/dL (ref 65–99)
Potassium: 5.1 mmol/L (ref 3.5–5.3)
Sodium: 137 mmol/L (ref 135–146)

## 2016-03-08 LAB — LIPID PANEL
CHOLESTEROL: 184 mg/dL (ref <200)
HDL: 41 mg/dL (ref >40)
LDL Cholesterol: 122 mg/dL — ABNORMAL HIGH (ref <100)
TRIGLYCERIDES: 103 mg/dL (ref <150)
Total CHOL/HDL Ratio: 4.5 Ratio (ref <5.0)
VLDL: 21 mg/dL (ref <30)

## 2016-03-08 LAB — MAGNESIUM: Magnesium: 2.5 mg/dL (ref 1.5–2.5)

## 2016-03-08 NOTE — Progress Notes (Signed)
Office Visit    Patient Name: Richard Reed Date of Encounter: 03/08/2016  Primary Care Provider:  Tawanna Solo, MD Primary Cardiologist:  Adora Fridge, MD   Chief Complaint    82 year old male with a history of nonobstructive CAD, hypertension, hyperlipidemia, paroxysmal atrial fibrillation, and cerebrovascular disease, who presents for follow-up after recent hospitalizations for heart failure and subsequently syncope.  Past Medical History    Past Medical History:  Diagnosis Date  . Angina   . Aortic valve insufficiency, moderate    a. 02/2016 Echo: mild AS, mild to mod AI.  Marland Kitchen Arthritis   . Arthritis pain   . CAD (coronary artery disease)    a. 09/2010 Cath: RCA 40-60 ost, otw nl cors.  . Cancer ([place])   . Cardiomyopathy ([place])    a. 02/2016 Echo: EF 45-50%, diff HK.  . Carotid disease, bilateral ([place])    a. 02/2016 Carotid U/S: 1039% bilat ICA plaquing.  . Cerebral aneurysm    a. 02/2016 MRA: fusiform bilobed aneurysm @ R MCA bifurcation.  . Cerebrovascular disease    a. 02/2016 MRA: long segment narrowing of basilar artery, moderate bilat posterior inferior cerebellar artery dzs.  . Chronic combined systolic and diastolic CHF (congestive heart failure) ([place])    a. 02/2016 Admitted to hospital in [place] w/ CHF;  b. 02/2016 Echo ([facility]): EF 45-50%, diff HK, gr1DD, mild AS, mild to mod AI, mildly dil Ao root, PASP 16mmHg.  . CKD (chronic kidney disease), stage III   . Essential hypertension   . Hard of hearing   . Hemiparesis and alteration of sensations as late effects of stroke ([place]) 04/29/2015  . Hiatal hernia   . HOH (hard of hearing)   . Hyperlipidemia   . Kidney stones   . PAF (paroxysmal atrial fibrillation) ([place])    a. 2012 - noted in setting of E coli bacteremia and septic shock.  . Pneumonia 03/23/2011   "first time"  . Prostate cancer ([place])   . Sepsis due to Escherichia coli ([place]) 03/24/11  . Syncope    a. 02/2016 presumed to be 2/2  hypotension/dehydration and basilar artery dzs noted on MRA.  Marland Kitchen TIA (transient ischemic attack)   . Visual disturbance 03/19/2014   Past Surgical History:  Procedure Laterality Date  . CARDIAC CATHETERIZATION  09/2010   non obstructive CAD, 40-60% ostial RCA stenosis without dampening   . CATARACT EXTRACTION Bilateral   . CHOLECYSTECTOMY  04/15/2011   Procedure: LAPAROSCOPIC CHOLECYSTECTOMY WITH INTRAOPERATIVE CHOLANGIOGRAM;  Surgeon: Belva Crome, MD;  Location: [facility];  Service: General;  Laterality: N/A;  . PROSTATE SURGERY     approx 10 years ago, seed implant radiation  . TONSILLECTOMY      Allergies  No Known Allergies  History of Present Illness    82 year old male developed complex past medical history including nonobstructive CAD status post catheterization in June 2012. He also has a history of paroxysmal atrial fibrillation in the setting of sepsis in 2012, hypertension, hyperlipidemia, mild aortic stenosis, mild to moderate aortic insufficiency, mild carotid vascular disease, and diffuse cerebrovascular disease. In early November, he was traveling in [place] and developed significant dyspnea. He was admitted to a hospital there and treated for heart failure. He was placed on carvedilol, lisinopril, Lasix, and spironolactone and was advised to follow-up with his cardiologist at home. He traveled home and did well. Unfortunately, on Sunday, November 12, he was sitting at church and started to feel lightheaded. He has no  recollection of losing consciousness however his wife noted that his head was tilted back and he would not respond. Its estimated that he was without consciousness for about 5 minutes. Eventually some other churchgoers helped him to the floor and he regained consciousness. He was taken to the ED where he was noted to have a blood pressure 97/61 with a heart rate of 61. His creatinine was elevated at 2.04. He was admitted by internal medicine and also seen by  neurology. CT of the head was negative for acute stroke. MRI and MRA showed diffuse cerebrovascular disease with long segment basilar arterial disease and also a right MCA fusiform aneurysm. Carotid ultrasounds showed 1-39% bilateral internal carotid artery stenosis. Echocardiogram showed an EF of 45-50% with diffuse hypokinesis. It was ultimately felt that his syncopal spell was likely the result of dehydration, relative hypotension, and bradycardia in the setting of vertebrobasilar disease. Medications were adjusted and he was taken off of beta blocker and ACE inhibitor and advised to only take Lasix as needed. Cardiology follow-up was arranged. Since discharge from the hospital, his family has been closely watching his sodium intake. He has been weighing daily and weight has been stable. He has not been having any chest pain or dyspnea. He does note a poor appetite which is not anything new. He further denies palpitations, PND, orthopnea, recurrent dizziness or syncope.  Home Medications    Prior to Admission medications   Medication Sig Start Date End Date Taking? Authorizing Provider  aspirin EC 81 MG tablet Take 81 mg by mouth daily.   Yes Historical Provider, MD  Calcium Citrate 200 MG TABS Take 4 tablets by mouth daily.   Yes Historical Provider, MD  cyanocobalamin 500 MCG tablet Take 500 mcg by mouth daily.   Yes Historical Provider, MD  DULERA 200-5 MCG/ACT AERO Take 2 puffs by mouth 2 (two) times daily. 02/19/16  Yes Historical Provider, MD  feeding supplement, ENSURE ENLIVE, (ENSURE ENLIVE) LIQD Take 237 mLs by mouth 2 (two) times daily between meals. 03/02/16  Yes Geradine Girt, DO  fish oil-omega-3 fatty acids 1000 MG capsule Take 1 g by mouth daily.    Yes Historical Provider, MD  furosemide (LASIX) 20 MG tablet Take 1 tablet (20 mg total) by mouth daily as needed. 03/01/16  Yes Geradine Girt, DO  Multiple Vitamin (MULTIVITAMIN) tablet Take 1 tablet by mouth daily.   Yes Historical  Provider, MD  VENTOLIN HFA 108 (90 Base) MCG/ACT inhaler Inhale 2 puffs into the lungs 2 (two) times daily. 02/22/16  Yes Historical Provider, MD    Review of Systems    He denies chest pain, palpitations, dyspnea, pnd, orthopnea, n, v, dizziness, syncope, edema, weight gain.  His appetite has been poor. All other systems reviewed and are otherwise negative except as noted above.  Physical Exam    VS:  BP 116/68   Pulse 88   Ht 5\' 8"  (1.727 m)   Wt 146 lb 6.4 oz (66.4 kg)   SpO2 97%   BMI 22.26 kg/m  , BMI Body mass index is 22.26 kg/m. GEN: Well nourished, well developed, in no acute distress.  HEENT: normal except for HOH. Neck: Supple, no JVD, carotid bruits, or masses. Cardiac: RRR, 2/6 SEM throughout, no rubs, or gallops. No clubbing, cyanosis, edema.  Radials/DP/PT 2+ and equal bilaterally.  Respiratory:  Respirations regular and unlabored, clear to auscultation bilaterally. GI: Soft, nontender, nondistended, BS + x 4. MS: no deformity or atrophy. Skin: warm  and dry, no rash. Neuro:  Strength and sensation are intact. Psych: Normal affect.  Accessory Clinical Findings    ECG - Regular sinus rhythm, 83 ventricular trigeminy, left axis deviation, left anterior fascicular block, incomplete right bundle branch block, nonspecific ST and T changes.  Assessment & Plan    1.  Syncope: Patient was recently admitted following a syncopal spell that occurred at church. In the ED, his pressures were in the 90s and his creatinine was elevated above previous baseline in the setting of recently having been placed on ACE inhibitor and diuretic therapy. Neurologic evaluation revealed diffuse cerebrovascular disease with long segment basilar arterial disease. It was felt that relative hypotension and basilar arterial disease or likely culprits for his symptoms. I noted irregular rhythm on exam today and EKG has revealed frequent PVCs. In the setting of new finding of cardiomyopathy with frequent  PVCs, I will follow-up a basic metabolic panel and magnesium today and also place a 30 day event monitor to assess for possible arrhythmia as a cause of his syncope.  2. Cardiomyopathy/chronic combined systolic and diastolic congestive heart failure: Patient was recently diagnosed with heart failure and hospitalized with shortness of breath and volume overload in [place] in early November. We do not have records from that hospitalization. He was placed on beta blocker, ACE inhibitor, Lasix, and spironolactone and subsequently discharged with recommendation to follow-up with his regular cardiologist. Family says today that they were advised he may need a stress test. F/u echo @ [facility] showed EF of 45-50% w/ global HK.  He has not been having any chest pain or dyspnea. I do agree that in the setting of a new cardiomyopathy, ischemic evaluation is appropriate. I will arrange for a Lexiscan Myoview to rule out ischemia. In the setting of recent admission with syncope, relative hypotension, and renal failure, he is no longer on beta blocker, ACE inhibitor, or spironolactone therapy. He does have Lasix at home but has been advised to only use this for weight gain greater than 3 pounds in 24 hours or 5 pounds over the course of the week. I am not inclined to add back beta blocker, ACE inhibitor, or spironolactone therapy at this time due to soft blood pressures and also recent acute kidney injury. He is euvolemic on exam and his family has been weighing him daily. Overall, he has been stable.  3. Essential hypertension: Blood pressure is stable. He is not currently on any antihypertensives.  4. Hyperlipidemia: At discharge, he was advised to continue Crestor. His family noted that he had not been taking that. We discussed the role of statin therapy in patients with prior history of non-obstructive CAD and also cerebrovascular disease that was discovered on recent MRA. We also discussed the utilization of statins in the  elderly and whether or not significant benefit is gained. At this point, he and his family would prefer to check a fasting lipid profile as it has been 2 years and then make decisions based on the results. We can check this today as he is fasting.  5. Cerebrovascular disease with right MCA aneurysm: Noted on MRA as outlined above. He is on aspirin and is considering using a statin. He does have follow-up with neurology. Review of discharge summary shows that he was also advised to follow-up with interventional radiology related to aneurysm.  6. Paroxysmal atrial fibrillation: He denies any recent palpitations. He has never been on oral anticoagulation as A. fib was only documented on one occasion in  the setting of sepsis. He is not currently on any AV nodal blocking agents in the setting of recent relative hypotension and syncope.  7. Stage 3-4 chronic kidney disease: Follow-up basic metabolic panel today. This was clearly worsened by the initiation of ACE inhibitor and diuretic therapy.  8. Disposition: Follow-up with Dr. [name] in 4-6 wks.  Murray Hodgkins, NP 03/08/2016, 12:02 PM

## 2016-03-08 NOTE — Patient Instructions (Signed)
Medication Instructions:  Continue current medications  Labwork: Magnesium, BMP and Fasting Lipids  Testing/Procedures: Your physician has recommended that you wear an event monitor. Event monitors are medical devices that record the heart's electrical activity. Doctors most often [place] these monitors to diagnose arrhythmias. Arrhythmias are problems with the speed or rhythm of the heartbeat. The monitor is a small, portable device. You can wear one while you do your normal daily activities. This is usually used to diagnose what is causing palpitations/syncope (passing out).  Your physician has requested that you have a lexiscan myoview. For further information please visit [email or web address]. Please follow instruction sheet, as given.   Follow-Up: Your physician recommends that you schedule a follow-up appointment in: 1 Months with Dr [name]   Any Other Special Instructions Will Be Listed Below (If Applicable).        Happy Thanksgiving   If you need a refill on your cardiac medications before your next appointment, please call your pharmacy.

## 2016-03-09 MED ORDER — ROSUVASTATIN CALCIUM 10 MG PO TABS: 10.0000 mg | ORAL_TABLET | Freq: Every day | ORAL | 6 refills | Status: DC

## 2016-03-09 NOTE — Telephone Encounter (Signed)
-----   Message from Rogelia Mire, NP sent at 03/09/2016  8:02 AM EST ----- Electrolytes wnl.  Renal fxn stable/improved since hospitalization.  LDL is 122.  Based on this and h/o nonobs CAD and cerebrovascular dzs, if he is willing (family was concerned about statins), we should start him back on crestor 10 mg daily.  If he is willing to take statin, he will need f/u lipids/lfts in 6 wks.

## 2016-03-09 NOTE — Telephone Encounter (Signed)
Results given to pt wife--OK per DPR. Pt wife verbalized understanding and agreed to Crestor 10 mg.   Placed order for labwork to be done in 6 weeks. Informed pt wife that I will mail them the lab slips.

## 2016-03-14 ENCOUNTER — Other Ambulatory Visit: Payer: Self-pay | Admitting: Radiology

## 2016-03-15 ENCOUNTER — Telehealth: Payer: Self-pay | Admitting: Cardiovascular Disease

## 2016-03-15 ENCOUNTER — Telehealth (HOSPITAL_COMMUNITY): Payer: Self-pay

## 2016-03-15 NOTE — Telephone Encounter (Signed)
Encounter complete. 

## 2016-03-15 NOTE — Telephone Encounter (Signed)
Pt wife returning my call. Scheduled pt for appt 03/30/16 with Ignacia Bayley, NP per Dr. [name] for NSVT seen on event monitor

## 2016-03-15 NOTE — Telephone Encounter (Signed)
Called pt to schedule an appointment with Ignacia Bayley, NP in 1-2 weeks for follow-up of NSVT seen on event monitor. Left message for pt to call back.

## 2016-03-16 ENCOUNTER — Other Ambulatory Visit: Payer: Self-pay | Admitting: Student

## 2016-03-17 ENCOUNTER — Encounter (HOSPITAL_COMMUNITY): Payer: Self-pay

## 2016-03-17 ENCOUNTER — Other Ambulatory Visit (HOSPITAL_COMMUNITY): Payer: Self-pay | Admitting: Interventional Radiology

## 2016-03-17 ENCOUNTER — Ambulatory Visit (HOSPITAL_COMMUNITY): Admission: RE | Admit: 2016-03-17 | Payer: Medicare Other | Source: Ambulatory Visit

## 2016-03-17 ENCOUNTER — Ambulatory Visit (HOSPITAL_COMMUNITY)
Admission: RE | Admit: 2016-03-17 | Discharge: 2016-03-17 | Disposition: A | Discharge status: Home / Self Care | Payer: Medicare Other | Source: Ambulatory Visit | Attending: Interventional Radiology | Admitting: Interventional Radiology

## 2016-03-17 ENCOUNTER — Telehealth: Payer: Self-pay | Admitting: Nurse Practitioner

## 2016-03-17 DIAGNOSIS — I480 Paroxysmal atrial fibrillation: Secondary | ICD-10-CM | POA: Diagnosis not present

## 2016-03-17 DIAGNOSIS — I351 Nonrheumatic aortic (valve) insufficiency: Secondary | ICD-10-CM | POA: Diagnosis not present

## 2016-03-17 DIAGNOSIS — I69359 Hemiplegia and hemiparesis following cerebral infarction affecting unspecified side: Secondary | ICD-10-CM | POA: Insufficient documentation

## 2016-03-17 DIAGNOSIS — Z8546 Personal history of malignant neoplasm of prostate: Secondary | ICD-10-CM | POA: Diagnosis not present

## 2016-03-17 DIAGNOSIS — R209 Unspecified disturbances of skin sensation: Secondary | ICD-10-CM | POA: Insufficient documentation

## 2016-03-17 DIAGNOSIS — I729 Aneurysm of unspecified site: Secondary | ICD-10-CM

## 2016-03-17 DIAGNOSIS — M1990 Unspecified osteoarthritis, unspecified site: Secondary | ICD-10-CM | POA: Diagnosis not present

## 2016-03-17 DIAGNOSIS — I6601 Occlusion and stenosis of right middle cerebral artery: Secondary | ICD-10-CM | POA: Diagnosis not present

## 2016-03-17 DIAGNOSIS — I69398 Other sequelae of cerebral infarction: Secondary | ICD-10-CM | POA: Diagnosis not present

## 2016-03-17 DIAGNOSIS — Z79899 Other long term (current) drug therapy: Secondary | ICD-10-CM | POA: Diagnosis not present

## 2016-03-17 DIAGNOSIS — I5042 Chronic combined systolic (congestive) and diastolic (congestive) heart failure: Secondary | ICD-10-CM | POA: Insufficient documentation

## 2016-03-17 DIAGNOSIS — I130 Hypertensive heart and chronic kidney disease with heart failure and stage 1 through stage 4 chronic kidney disease, or unspecified chronic kidney disease: Secondary | ICD-10-CM | POA: Diagnosis not present

## 2016-03-17 DIAGNOSIS — I651 Occlusion and stenosis of basilar artery: Secondary | ICD-10-CM | POA: Diagnosis not present

## 2016-03-17 DIAGNOSIS — N183 Chronic kidney disease, stage 3 (moderate): Secondary | ICD-10-CM | POA: Insufficient documentation

## 2016-03-17 DIAGNOSIS — I6502 Occlusion and stenosis of left vertebral artery: Secondary | ICD-10-CM | POA: Diagnosis not present

## 2016-03-17 DIAGNOSIS — I671 Cerebral aneurysm, nonruptured: Secondary | ICD-10-CM | POA: Insufficient documentation

## 2016-03-17 DIAGNOSIS — I672 Cerebral atherosclerosis: Secondary | ICD-10-CM | POA: Insufficient documentation

## 2016-03-17 DIAGNOSIS — I2510 Atherosclerotic heart disease of native coronary artery without angina pectoris: Secondary | ICD-10-CM | POA: Insufficient documentation

## 2016-03-17 HISTORY — PX: IR GENERIC HISTORICAL: IMG1180011

## 2016-03-17 LAB — PROTIME-INR
INR: 1.03
Prothrombin Time: 13.5 seconds (ref 11.4–15.2)

## 2016-03-17 LAB — COMPREHENSIVE METABOLIC PANEL
ALBUMIN: 3.7 g/dL (ref 3.5–5.0)
ALK PHOS: 72 U/L (ref 38–126)
ALT: 16 U/L — AB (ref 17–63)
AST: 18 U/L (ref 15–41)
Anion gap: 10 (ref 5–15)
BILIRUBIN TOTAL: 1 mg/dL (ref 0.3–1.2)
BUN: 36 mg/dL — AB (ref 6–20)
CALCIUM: 9.4 mg/dL (ref 8.9–10.3)
CO2: 26 mmol/L (ref 22–32)
CREATININE: 1.48 mg/dL — AB (ref 0.61–1.24)
Chloride: 102 mmol/L (ref 101–111)
GFR calc Af Amer: 47 mL/min — ABNORMAL LOW (ref >60)
GFR calc non Af Amer: 40 mL/min — ABNORMAL LOW (ref >60)
GLUCOSE: 96 mg/dL (ref 65–99)
Potassium: 4.2 mmol/L (ref 3.5–5.1)
Sodium: 138 mmol/L (ref 135–145)
TOTAL PROTEIN: 6.5 g/dL (ref 6.5–8.1)

## 2016-03-17 LAB — CBC WITH DIFFERENTIAL/PLATELET
BASOS ABS: 0 10*3/uL (ref 0.0–0.1)
BASOS PCT: 0 %
Eosinophils Absolute: 0.1 10*3/uL (ref 0.0–0.7)
Eosinophils Relative: 2 %
HEMATOCRIT: 44.6 % (ref 39.0–52.0)
HEMOGLOBIN: 14.9 g/dL (ref 13.0–17.0)
Lymphocytes Relative: 26 %
Lymphs Abs: 1.3 10*3/uL (ref 0.7–4.0)
MCH: 30.3 pg (ref 26.0–34.0)
MCHC: 33.4 g/dL (ref 30.0–36.0)
MCV: 90.7 fL (ref 78.0–100.0)
MONOS PCT: 8 %
Monocytes Absolute: 0.4 10*3/uL (ref 0.1–1.0)
NEUTROS ABS: 3.2 10*3/uL (ref 1.7–7.7)
NEUTROS PCT: 64 %
Platelets: 132 10*3/uL — ABNORMAL LOW (ref 150–400)
RBC: 4.92 MIL/uL (ref 4.22–5.81)
RDW: 15.7 % — ABNORMAL HIGH (ref 11.5–15.5)
WBC: 5 10*3/uL (ref 4.0–10.5)

## 2016-03-17 MED ORDER — IOPAMIDOL (ISOVUE-300) INJECTION 61%
INTRAVENOUS | Status: AC
  Administered 2016-03-17: 65 mL
  Filled 2016-03-17: qty 150

## 2016-03-17 MED ORDER — HEPARIN SODIUM (PORCINE) 1000 UNIT/ML IJ SOLN
INTRAMUSCULAR | Status: AC
  Filled 2016-03-17: qty 2

## 2016-03-17 MED ORDER — SODIUM CHLORIDE 0.9 % IV SOLN: INTRAVENOUS | Status: DC

## 2016-03-17 MED ORDER — LIDOCAINE HCL 1 % IJ SOLN
INTRAMUSCULAR | Status: AC | PRN
  Administered 2016-03-17: 10 mL

## 2016-03-17 MED ORDER — MIDAZOLAM HCL 2 MG/2ML IJ SOLN
INTRAMUSCULAR | Status: AC
  Filled 2016-03-17: qty 2

## 2016-03-17 MED ORDER — FENTANYL CITRATE (PF) 100 MCG/2ML IJ SOLN
INTRAMUSCULAR | Status: AC
  Filled 2016-03-17: qty 2

## 2016-03-17 MED ORDER — HEPARIN SODIUM (PORCINE) 1000 UNIT/ML IJ SOLN
INTRAMUSCULAR | Status: AC | PRN
  Administered 2016-03-17: 500 [IU] via INTRAVENOUS
  Administered 2016-03-17: 1000 [IU] via INTRAVENOUS

## 2016-03-17 MED ORDER — SODIUM CHLORIDE 0.9 % IV SOLN
INTRAVENOUS | Status: AC | PRN
  Administered 2016-03-17: 250 mL via INTRAVENOUS
  Administered 2016-03-17: 75 mL/h via INTRAVENOUS

## 2016-03-17 MED ORDER — LIDOCAINE HCL 1 % IJ SOLN
INTRAMUSCULAR | Status: AC
  Filled 2016-03-17: qty 20

## 2016-03-17 MED ORDER — SODIUM CHLORIDE 0.9 % IV SOLN: INTRAVENOUS | Status: AC

## 2016-03-17 MED ORDER — MIDAZOLAM HCL 2 MG/2ML IJ SOLN
INTRAMUSCULAR | Status: AC | PRN
  Administered 2016-03-17: 1 mg via INTRAVENOUS

## 2016-03-17 MED ORDER — FENTANYL CITRATE (PF) 100 MCG/2ML IJ SOLN
INTRAMUSCULAR | Status: AC | PRN
  Administered 2016-03-17: 25 ug via INTRAVENOUS

## 2016-03-17 NOTE — Sedation Documentation (Signed)
5 Fr. Exoseal to right groin 

## 2016-03-17 NOTE — H&P (Signed)
Chief Complaint: MCA aneurysm  Referring Physician:Dr. Melba Coon  Supervising Physician: [name]  Patient Status: [facility] - Out-pt  HPI: Richard Reed is an 82 y.o. male who has multiple medical problems who was admitted about 2 weeks secondary to a syncopal episode at church.  He was admitted about 2 weeks before than in [place] secondary to fluid overload.  He had an MRA while he was here in the hospital which revealed an MCA aneurysm.  He improved and was discharged with follow up for an outpatient diagnostic cerebral angiogram.  He is also supposed to be getting a stress test as well.  He presents today for his angiogram.    Past Medical History:  Past Medical History:  Diagnosis Date  . Angina   . Aortic valve insufficiency, moderate    a. 02/2016 Echo: mild AS, mild to mod AI.  Marland Kitchen Arthritis   . Arthritis pain   . CAD (coronary artery disease)    a. 09/2010 Cath: RCA 40-60 ost, otw nl cors.  . Cancer ([place])   . Cardiomyopathy ([place])    a. 02/2016 Echo: EF 45-50%, diff HK.  . Carotid disease, bilateral ([place])    a. 02/2016 Carotid U/S: 1039% bilat ICA plaquing.  . Cerebral aneurysm    a. 02/2016 MRA: fusiform bilobed aneurysm @ R MCA bifurcation.  . Cerebrovascular disease    a. 02/2016 MRA: long segment narrowing of basilar artery, moderate bilat posterior inferior cerebellar artery dzs.  . Chronic combined systolic and diastolic CHF (congestive heart failure) ([place])    a. 02/2016 Admitted to hospital in [place] w/ CHF;  b. 02/2016 Echo ([facility]): EF 45-50%, diff HK, gr1DD, mild AS, mild to mod AI, mildly dil Ao root, PASP 11mHg.  . CKD (chronic kidney disease), stage III   . Essential hypertension   . Hard of hearing   . Hemiparesis and alteration of sensations as late effects of stroke ([place] 04/29/2015  . Hiatal hernia   . HOH (hard of hearing)   . Hyperlipidemia   . Kidney stones   . PAF (paroxysmal atrial fibrillation) ([place]    a. 2012 - noted in setting of E coli  bacteremia and septic shock.  . Pneumonia 03/23/2011   "first time"  . [place]   . [place] 03/24/11  . Syncope    a. 02/2016 presumed to be 2/2 hypotension/dehydration and basilar artery dzs noted on MRA.  .Marland KitchenTIA (transient ischemic attack)   . Visual disturbance 03/19/2014    Past Surgical History:  Past Surgical History:  Procedure Laterality Date  . CARDIAC CATHETERIZATION  09/2010   non obstructive CAD, 40-60% ostial RCA stenosis without dampening   . CATARACT EXTRACTION Bilateral   . CHOLECYSTECTOMY  04/15/2011   Procedure: LAPAROSCOPIC CHOLECYSTECTOMY WITH INTRAOPERATIVE CHOLANGIOGRAM;  Surgeon: JBelva Crome MD;  [facility]  Service: General;  Laterality: N/A;  . PROSTATE SURGERY     approx 10 years ago, seed implant radiation  . TONSILLECTOMY      Family History:  Family History  Problem Relation Age of Onset  . Heart failure Mother   . Heart disease Mother   . Heart disease Father   . Cancer Brother     prostate    Social History:  reports that he has never smoked. He has never used smokeless tobacco. He reports that he does not drink alcohol or use drugs.  Allergies: No Known Allergies  Medications:  Medications reviewed in epic  Please HPI for pertinent positives, otherwise complete 10 system ROS negative.  Mallampati Score: MD Evaluation Airway: WNL Heart: WNL Abdomen: WNL Chest/ Lungs: WNL ASA  Classification: 3 Mallampati/Airway Score: Two  Physical Exam: BP 139/80   Pulse 68   Temp 97.7 F (36.5 C) (Oral)   Resp 16   Ht 5' 8"  (1.727 m)   Wt 145 lb (65.8 kg)   SpO2 98%   BMI 22.05 kg/m  Body mass index is 22.05 kg/m. General: pleasant, elderly white male who is laying in bed in NAD HEENT: head is normocephalic, atraumatic.  Sclera are noninjected.  PERRL.  Ears and nose without any masses or lesions.  Mouth is pink and moist Heart: normal rate, but irregularly irregular.  Normal s1,s2. No  obvious murmurs, gallops, or rubs noted.  Palpable radial and pedal pulses bilaterally Lungs: CTAB, no wheezes, rhonchi, or rales noted.  Respiratory effort nonlabored Abd: soft, NT, ND, +BS, no masses, hernias, or organomegaly Psych: A&Ox3 with an appropriate affect.   Labs: Results for orders placed or performed during the hospital encounter of 03/17/16 (from the past 48 hour(s))  CBC with Differential/Platelet     Status: Abnormal   Collection Time: 03/17/16  7:00 AM  Result Value Ref Range   WBC 5.0 4.0 - 10.5 K/uL   RBC 4.92 4.22 - 5.81 MIL/uL   Hemoglobin 14.9 13.0 - 17.0 g/dL   HCT 44.6 39.0 - 52.0 %   MCV 90.7 78.0 - 100.0 fL   MCH 30.3 26.0 - 34.0 pg   MCHC 33.4 30.0 - 36.0 g/dL   RDW 15.7 (H) 11.5 - 15.5 %   Platelets 132 (L) 150 - 400 K/uL   Neutrophils Relative % 64 %   Neutro Abs 3.2 1.7 - 7.7 K/uL   Lymphocytes Relative 26 %   Lymphs Abs 1.3 0.7 - 4.0 K/uL   Monocytes Relative 8 %   Monocytes Absolute 0.4 0.1 - 1.0 K/uL   Eosinophils Relative 2 %   Eosinophils Absolute 0.1 0.0 - 0.7 K/uL   Basophils Relative 0 %   Basophils Absolute 0.0 0.0 - 0.1 K/uL  Comprehensive metabolic panel     Status: Abnormal   Collection Time: 03/17/16  7:00 AM  Result Value Ref Range   Sodium 138 135 - 145 mmol/L   Potassium 4.2 3.5 - 5.1 mmol/L   Chloride 102 101 - 111 mmol/L   CO2 26 22 - 32 mmol/L   Glucose, Bld 96 65 - 99 mg/dL   BUN 36 (H) 6 - 20 mg/dL   Creatinine, Ser 1.48 (H) 0.61 - 1.24 mg/dL   Calcium 9.4 8.9 - 10.3 mg/dL   Total Protein 6.5 6.5 - 8.1 g/dL   Albumin 3.7 3.5 - 5.0 g/dL   AST 18 15 - 41 U/L   ALT 16 (L) 17 - 63 U/L   Alkaline Phosphatase 72 38 - 126 U/L   Total Bilirubin 1.0 0.3 - 1.2 mg/dL   GFR calc non Af Amer 40 (L) >60 mL/min   GFR calc Af Amer 47 (L) >60 mL/min    Comment: (NOTE) The eGFR has been calculated using the CKD EPI equation. This calculation has not been validated in all clinical situations. eGFR's persistently <60 mL/min signify  possible Chronic Kidney Disease.    Anion gap 10 5 - 15  Protime-INR     Status: None   Collection Time: 03/17/16  7:00 AM  Result Value Ref Range  Prothrombin Time 13.5 11.4 - 15.2 seconds   INR 1.03     Imaging: No results found.  Assessment/Plan 1. (R) MCA aneurysm -we will proceed with a diagnostic cerebral angiogram today to further evaluate this finding on MRA. -the patient's creatinine is 1.48.  This has been reviewed with Dr. Estanislado Pandy and he is ok to proceed. -Risks and Benefits discussed with the patient including, but not limited to bleeding, infection, vascular injury or contrast induced renal failure. All of the patient's questions were answered, patient is agreeable to proceed. Consent signed and in chart.   Thank you for this interesting consult.  I greatly enjoyed meeting Richard Reed and look forward to participating in their care.  A copy of this report was sent to the requesting provider on this date.  Electronically Signed: Henreitta Cea 03/17/2016, 8:40 AM   I spent a total of  30 Minutes   in face to face in clinical consultation, greater than 50% of which was counseling/coordinating care for right MCA aneurysm

## 2016-03-17 NOTE — Telephone Encounter (Signed)
New message  Pt is calling, wants to know if the stress test needs to be rescheduled prior to appt.

## 2016-03-17 NOTE — Discharge Instructions (Signed)

## 2016-03-17 NOTE — Telephone Encounter (Signed)
New message   Pt is returning Felicia's call  Please call back

## 2016-03-17 NOTE — Telephone Encounter (Signed)
Spoke to [place], nuc med has attempted to contact pt to reschedule this.

## 2016-03-17 NOTE — Procedures (Signed)
S/P 4 vessel cerebral arteriogram. RT CFA approach. Findings. 1.High grade Lt VBJ 90% plus  and prox basilar artery stenosis. 2.Approx  4MM X 13MM r RT MCA distal  M1 FUSIFORM ANEURYSM

## 2016-03-17 NOTE — Sedation Documentation (Signed)
ETC02 removed per Dr. Deveshwar  

## 2016-03-18 ENCOUNTER — Encounter (HOSPITAL_COMMUNITY): Payer: Self-pay | Admitting: Interventional Radiology

## 2016-03-25 ENCOUNTER — Telehealth (HOSPITAL_COMMUNITY): Payer: Self-pay

## 2016-03-25 ENCOUNTER — Telehealth: Payer: Self-pay | Admitting: *Deleted

## 2016-03-25 NOTE — Telephone Encounter (Signed)
Spoke with pt, he is feeling fine. No problems and knows about his follow up appointment next week.

## 2016-03-25 NOTE — Telephone Encounter (Signed)
Encounter complete. 

## 2016-03-25 NOTE — Telephone Encounter (Signed)
Strips from monitor show 7 beat run of NSVT. Left message for pt to call, he has a follow up next week and stress test. stripts shown to dr [place] DOD.

## 2016-03-25 NOTE — Telephone Encounter (Signed)
New message      Returning a call to the nurse--pt is wearing a monitor.

## 2016-03-30 ENCOUNTER — Encounter: Payer: Self-pay | Admitting: Nurse Practitioner

## 2016-03-30 ENCOUNTER — Ambulatory Visit (INDEPENDENT_AMBULATORY_CARE_PROVIDER_SITE_OTHER): Payer: Medicare Other | Admitting: Nurse Practitioner

## 2016-03-30 ENCOUNTER — Ambulatory Visit (HOSPITAL_COMMUNITY)
Admission: RE | Admit: 2016-03-30 | Discharge: 2016-03-30 | Disposition: A | Discharge status: Home / Self Care | Payer: Medicare Other | Source: Ambulatory Visit | Attending: Cardiovascular Disease | Admitting: Cardiovascular Disease

## 2016-03-30 VITALS — BP 159/70 | HR 56 | Ht 68.0 in | Wt 148.0 lb

## 2016-03-30 DIAGNOSIS — I501 Left ventricular failure: Secondary | ICD-10-CM | POA: Insufficient documentation

## 2016-03-30 DIAGNOSIS — I493 Ventricular premature depolarization: Secondary | ICD-10-CM

## 2016-03-30 DIAGNOSIS — I472 Ventricular tachycardia: Secondary | ICD-10-CM

## 2016-03-30 DIAGNOSIS — I4729 Other ventricular tachycardia: Secondary | ICD-10-CM

## 2016-03-30 DIAGNOSIS — R55 Syncope and collapse: Secondary | ICD-10-CM

## 2016-03-30 DIAGNOSIS — E782 Mixed hyperlipidemia: Secondary | ICD-10-CM

## 2016-03-30 DIAGNOSIS — I429 Cardiomyopathy, unspecified: Secondary | ICD-10-CM | POA: Insufficient documentation

## 2016-03-30 DIAGNOSIS — I10 Essential (primary) hypertension: Secondary | ICD-10-CM | POA: Diagnosis not present

## 2016-03-30 LAB — MYOCARDIAL PERFUSION IMAGING
CHL CUP NUCLEAR SRS: 2
CHL CUP NUCLEAR SSS: 4
CSEPPHR: 133 {beats}/min
LV dias vol: 109 mL (ref 62–150)
LVSYSVOL: 59 mL
Rest HR: 85 {beats}/min
SDS: 2
TID: 0.86

## 2016-03-30 MED ORDER — AMINOPHYLLINE 25 MG/ML IV SOLN
75.0000 mg | Freq: Once | INTRAVENOUS | Status: AC
  Administered 2016-03-30: 75 mg via INTRAVENOUS

## 2016-03-30 MED ORDER — TECHNETIUM TC 99M TETROFOSMIN IV KIT
10.6000 | PACK | Freq: Once | INTRAVENOUS | Status: AC | PRN
  Administered 2016-03-30: 10.6 via INTRAVENOUS
  Filled 2016-03-30: qty 11

## 2016-03-30 MED ORDER — CARVEDILOL 3.125 MG PO TABS: 3.1250 mg | ORAL_TABLET | Freq: Two times a day (BID) | ORAL | 11 refills | Status: DC

## 2016-03-30 MED ORDER — TECHNETIUM TC 99M TETROFOSMIN IV KIT
30.5000 | PACK | Freq: Once | INTRAVENOUS | Status: AC | PRN
  Administered 2016-03-30: 30.5 via INTRAVENOUS
  Filled 2016-03-30: qty 31

## 2016-03-30 MED ORDER — REGADENOSON 0.4 MG/5ML IV SOLN
0.4000 mg | Freq: Once | INTRAVENOUS | Status: AC
  Administered 2016-03-30: 0.4 mg via INTRAVENOUS

## 2016-03-30 NOTE — Patient Instructions (Addendum)
Medication Instructions:  START- Carvedilol 3.125 mg twice a day  Labwork: None ordered  Testing/Procedures: None ordered  Follow-Up: Your physician recommends that you schedule a follow-up appointment in: Keep appointment with Dr [name] on January 9th   Any Other Special Instructions Will Be Listed Below (If Applicable).   If you need a refill on your cardiac medications before your next appointment, please call your pharmacy.

## 2016-03-30 NOTE — Progress Notes (Signed)
Office Visit    Patient Name: Richard Reed Date of Encounter: 03/30/2016  Primary Care Provider:  Tawanna Solo, MD Primary Cardiologist:  Adora Fridge, MD   Chief Complaint    82 year old male with a history of nonobstructive CAD, hypertension, hyperlipidemia, paroxysmal atrial fibrillation, and cerebrovascular disease, who presents for follow-up after recent finding of NSVT on event monitor.  Past Medical History    Past Medical History:  Diagnosis Date  . Angina   . Aortic valve insufficiency, moderate    a. 02/2016 Echo: mild AS, mild to mod AI.  Marland Kitchen Arthritis   . Arthritis pain   . CAD (coronary artery disease)    a. 09/2010 Cath: RCA 40-60 ost, otw nl cors.  . Cancer ([place])   . Cardiomyopathy ([place])    a. 02/2016 Echo: EF 45-50%, diff HK.  . Carotid disease, bilateral ([place])    a. 02/2016 Carotid U/S: 1-39% bilat ICA plaquing.  . Cerebral aneurysm    a. 02/2016 MRA: fusiform bilobed aneurysm @ R MCA bifurcation.  . Cerebrovascular disease    a. 02/2016 MRA: long segment narrowing of basilar artery, moderate bilat posterior inferior cerebellar artery dzs.  . Chronic combined systolic and diastolic CHF (congestive heart failure) ([place])    a. 02/2016 Admitted to hospital in [place] w/ CHF;  b. 02/2016 Echo ([facility]): EF 45-50%, diff HK, gr1DD, mild AS, mild to mod AI, mildly dil Ao root, PASP 57mmHg.  . CKD (chronic kidney disease), stage III   . Essential hypertension   . Hard of hearing   . Hemiparesis and alteration of sensations as late effects of stroke ([place]) 04/29/2015  . Hiatal hernia   . HOH (hard of hearing)   . Hyperlipidemia   . Kidney stones   . NSVT (nonsustained ventricular tachycardia) ([place])    a. 03/2016 noted on event monitor.  Marland Kitchen PAF (paroxysmal atrial fibrillation) ([place])    a. 2012 - noted in setting of E coli bacteremia and septic shock.  . Pneumonia 03/23/2011   "first time"  . Prostate cancer ([place])   . Sepsis due to Escherichia coli ([place]) 03/24/11    . Syncope    a. 02/2016 presumed to be 2/2 hypotension/dehydration and basilar artery dzs noted on MRA.  Marland Kitchen TIA (transient ischemic attack)   . Visual disturbance 03/19/2014   Past Surgical History:  Procedure Laterality Date  . CARDIAC CATHETERIZATION  09/2010   non obstructive CAD, 40-60% ostial RCA stenosis without dampening   . CATARACT EXTRACTION Bilateral   . CHOLECYSTECTOMY  04/15/2011   Procedure: LAPAROSCOPIC CHOLECYSTECTOMY WITH INTRAOPERATIVE CHOLANGIOGRAM;  Surgeon: Belva Crome, MD;  Location: [facility];  Service: General;  Laterality: N/A;  . IR GENERIC HISTORICAL  03/17/2016   IR ANGIO VERTEBRAL SEL SUBCLAVIAN INNOMINATE BILAT MOD SED 03/17/2016 [name], MD [facility]  . IR GENERIC HISTORICAL  03/17/2016   IR ANGIO INTRA EXTRACRAN SEL COM CAROTID INNOMINATE BILAT MOD SED 03/17/2016 [name], MD [facility]  . PROSTATE SURGERY     approx 10 years ago, seed implant radiation  . TONSILLECTOMY      Allergies  No Known Allergies  History of Present Illness    82 year old male developed complex past medical history including nonobstructive CAD status post catheterization in June 2012. He also has a history of paroxysmal atrial fibrillation in the setting of sepsis in 2012, hypertension, hyperlipidemia, mild aortic stenosis, mild to moderate aortic insufficiency, mild carotid vascular disease, and diffuse cerebrovascular disease. In  early November, he was traveling in [place] and developed significant dyspnea. He was admitted to a hospital there and treated for heart failure. He was placed on carvedilol, lisinopril, Lasix, and spironolactone and was advised to follow-up with his cardiologist at home. He traveled home and did well. Unfortunately, on November 12, he had a syncopal episode @ church.  He was taken to the ED where he was noted to have a blood pressure 97/61 with a heart rate of 61. His creatinine was elevated at 2.04. He was admitted by internal  medicine and also seen by neurology. CT of the head was negative for acute stroke. MRI and MRA showed diffuse cerebrovascular disease with long segment basilar arterial disease and also a right MCA fusiform aneurysm. Carotid ultrasounds showed 1-39% bilateral internal carotid artery stenosis. Echocardiogram showed an EF of 45-50% with diffuse hypokinesis. It was ultimately felt that his syncopal spell was likely the result of dehydration, relative hypotension, and bradycardia in the setting of vertebrobasilar disease. Medications were adjusted and he was taken off of beta blocker and ACE inhibitor and advised to only take Lasix as needed.   I saw him on 11/21, @ which time he was feeling well and weights had been stable.  BP was 116/68.  Given recent finding of LV dysfxn and CHF in [place], I arranged for a lexiscan MV and also placed a 30 day event monitor in the setting of recent syncope.  On 11/28, he was noted to have 7 beats of NSVT.  He was contacted and apparently asymptomatic and f/u appt was scheduled for today.  He just underwent lexiscan MV earlier this afternoon, however results are not currently available.  He says that he has been feeling well since his last visit.  He denies chest pain, palpitations, dyspnea, pnd, orthopnea, n, v, dizziness, syncope, edema, weight gain, or early satiety.   Home Medications    Prior to Admission medications   Medication Sig Start Date End Date Taking? Authorizing Provider  aspirin EC 81 MG tablet Take 81 mg by mouth daily.    Historical Provider, MD  Calcium Citrate 200 MG TABS Take 4 tablets by mouth daily.    Historical Provider, MD  cyanocobalamin 500 MCG tablet Take 500 mcg by mouth daily.    Historical Provider, MD  DULERA 200-5 MCG/ACT AERO Take 2 puffs by mouth 2 (two) times daily. 02/19/16   Historical Provider, MD  feeding supplement, ENSURE ENLIVE, (ENSURE ENLIVE) LIQD Take 237 mLs by mouth 2 (two) times daily between meals. 03/02/16   Geradine Girt, DO  fish oil-omega-3 fatty acids 1000 MG capsule Take 1 g by mouth daily.     Historical Provider, MD  furosemide (LASIX) 20 MG tablet Take 1 tablet (20 mg total) by mouth daily as needed. 03/01/16   Geradine Girt, DO  Multiple Vitamin (MULTIVITAMIN) tablet Take 1 tablet by mouth daily.    Historical Provider, MD  rosuvastatin (CRESTOR) 10 MG tablet Take 1 tablet (10 mg total) by mouth daily. 03/09/16 06/07/16  Rogelia Mire, NP  tamsulosin (FLOMAX) 0.4 MG CAPS capsule Take 0.4 mg by mouth.    Historical Provider, MD  VENTOLIN HFA 108 (90 Base) MCG/ACT inhaler Inhale 2 puffs into the lungs every 4 (four) hours as needed.  02/22/16   Historical Provider, MD    Review of Systems    He denies chest pain, palpitations, dyspnea, pnd, orthopnea, n, v, dizziness, syncope, edema, weight gain, or early satiety.  All  other systems reviewed and are otherwise negative except as noted above.  Physical Exam    VS:  BP (!) 159/70   Pulse (!) 56   Ht 5\' 8"  (1.727 m)   Wt 148 lb (67.1 kg)   BMI 22.50 kg/m  , BMI Body mass index is 22.5 kg/m. GEN: Well nourished, well developed, in no acute distress.  HEENT: normal.  Neck: Supple, no JVD, carotid bruits, or masses. Cardiac: Irreg, 2/6 SEM @ upper sternal border, no rubs, or gallops. No clubbing, cyanosis, edema.  Radials/DP/PT 2+ and equal bilaterally.  Respiratory:  Respirations regular and unlabored, clear to auscultation bilaterally. GI: Soft, nontender, nondistended, BS + x 4. MS: no deformity or atrophy. Skin: warm and dry, no rash. Neuro:  Strength and sensation are intact. Psych: Normal affect.  Accessory Clinical Findings    ECG - RSR, 98, freq PVC's, LAD, LAFB, IVCD, non-specific st changes.  No acute st/t changes.  Assessment & Plan    1.  NSVT/frequent PVCs:  Pt has been wearing an event monitor in the setting of recent syncope admission in November.  He was recently noted to have 7 beats of NSVT.  This was asymptomatic.   Lytes were stable on 11/30 with nl Mg on 11/21.  He was taken off of  blocker during hospitalization in Nov, however BP is much improved.  He cont to have freq PVC's on ecg.  I will add back coreg 3.125 mg BID.  Continue 30 day event monitor to assess overall PVC burden and contribution to cardiomyopathy.  He also underewnt stress testing this afternoon.  He literally just came off of the camera following stress imaging, thus results are not currently available.    2.  Syncope: Patient was recently admitted following a syncopal spell that occurred at church. This was felt to be most likely related to hypotension and dehydration and recently started  blocker and acei were d/c'd @ the time.  EF 45-50% by echo.  He has been wearing an event monitor and this has shown 7 beats of NSVT, though this was asymptomatic.  Cont event monitoring.   3. Cardiomyopathy/chronic combined systolic and diastolic congestive heart failure: Patient was recently diagnosed with heart failure and hospitalized with shortness of breath and volume overload in [place] in early November. We do not have records from that hospitalization. He was placed on beta blocker, ACE inhibitor, Lasix, and spironolactone and subsequently discharged with recommendation to follow-up with his regular cardiologist.  Those meds were d/c'd in Nov in setting of syncope and hypotension.  F/u echo @ [facility] showed EF of 45-50% w/ global HK.  He has not been having any chest pain or dyspnea. He underwent stress testing this afternoon, though results are not readily available.  As above, I am adding back  blocker therapy.  He is euvolemic on exam and his family has been weighing him daily. Overall, he has been stable.  3. Essential hypertension: Blood pressure is elevated today.  Adding back low dose coreg as above.  4. Hyperlipidemia: recent LDL 122.  Nl lfts.  Cont crestor.  5. Cerebrovascular disease with right MCA aneurysm: Noted on MRA as outlined  above. Cont asa/statin.Marland Kitchen He does have follow-up with neurology. Review of discharge summary shows that he was also advised to follow-up with interventional radiology related to aneurysm.  6. Paroxysmal atrial fibrillation: He denies any recent palpitations. He has never been on oral anticoagulation as A. fib was only documented on one occasion in  the setting of sepsis. Cont event monitoring.  Adding  blocker today.  7. Stage 3-4 chronic kidney disease: Stable by lab f/u on 11/30.  This was clearly worsened by the initiation of ACE inhibitor and diuretic therapy, though he was also hypotensive at the time.  8. Disposition: Follow-up stress test result when available, likely w/in the next 24 hrs.  F/u event monitor.  F/u with Dr. [name] in January as planned.   Murray Hodgkins, NP 03/30/2016, 5:03 PM

## 2016-03-31 ENCOUNTER — Encounter: Payer: Self-pay | Admitting: Internal Medicine

## 2016-03-31 ENCOUNTER — Ambulatory Visit (INDEPENDENT_AMBULATORY_CARE_PROVIDER_SITE_OTHER)
Admission: RE | Admit: 2016-03-31 | Discharge: 2016-03-31 | Disposition: A | Discharge status: Home / Self Care | Payer: Medicare Other | Source: Ambulatory Visit | Attending: Internal Medicine | Admitting: Internal Medicine

## 2016-03-31 ENCOUNTER — Ambulatory Visit (INDEPENDENT_AMBULATORY_CARE_PROVIDER_SITE_OTHER): Payer: Medicare Other | Admitting: Internal Medicine

## 2016-03-31 VITALS — BP 140/80 | HR 82 | Ht 68.0 in | Wt 148.0 lb

## 2016-03-31 DIAGNOSIS — R918 Other nonspecific abnormal finding of lung field: Secondary | ICD-10-CM | POA: Diagnosis not present

## 2016-03-31 DIAGNOSIS — J920 Pleural plaque with presence of asbestos: Secondary | ICD-10-CM | POA: Diagnosis not present

## 2016-03-31 NOTE — Patient Instructions (Addendum)
Please remember to go to the   x-ray department downstairs for your tests - we will call you with the results when they are available.  Only use your albuterol as a rescue medication to be used if you can't catch your breath by resting or doing a relaxed purse lip breathing pattern.  - The less you use it, the better it will work when you need it. - Ok to use up to 2 puffs  every 4 hours if you must but call for immediate appointment if use goes up over your usual need - Don't leave home without it !!  (think of it like the spare tire for your car)   There is no treatment for asbestos in the lining of the lungs but it does make it challenging to sort out how much of symptoms are fluid related and ok to stop dulera to see how you feel and return as needed   .

## 2016-03-31 NOTE — Progress Notes (Signed)
Subjective:    Patient ID: Richard Reed, male    DOB: [birth year], 82 y.o.   MRN: [id number]  HPI  65 yowm  Never smoker very sedentary since esp 2016 / rarely doing any shopping but was able to paint outdoor /indoors prior to trip to [place] late Oct 2017 and suddenly sob > admit to [place] dx with pl effusion and dx of chf and then syncope 2 day p  return to [place] > [facility]    Admit date: 02/28/2016 Discharge date: 03/01/2016   Recommendations for Outpatient Follow-Up:   1. Outpatient referral to pulm-- had CT scan in [place] that showed pleural based plaques-- started on dulera 2. Cardiology follow up 3. Weigh daily   Discharge Diagnosis:   Principal Problem:   LOC (loss of consciousness) ([place]) Active Problems:   Acute on chronic renal failure (HCC)   Thrombocytopenia (HCC)   UTI (urinary tract infection)   Syncope   Discharge disposition:  Home.  Discharge Condition: Improved.  Diet recommendation: Low sodium, heart healthy.  Wound care: None.   History of Present Illness:   SHED Richard Reed 82 y.o.malepast medical history of coronary artery disease, asbestosis, COPD, CHF,cancer, CKD, poorhearing, hypertension, irregular heart rate presented to the emergency room with LOC. Patient states that he was in his usual health today when he went to church. He did skip breakfast. He was there for several hours and then while sitting in Reed pew began to feel odd. Patient states the next thing he knew he was surrounded by several individuals who were standing over him. Patient's wife states that while sitting in church she looked over and she noticed that her husband's head was back in his eyes were open. She thought initially was taking Reed nap but when she tried to rouse him he did not respond. The church's medical response team stimulated the patient, their exact reactions are unknown. However patient did come around after being unresponsive for about 5  minutes. No tongue biting and no loss of bowel or bladder,no shaking. EMS was activated and brought patient to the emergency room for evaluation.   Hospital Course by Problem:   Syncope:  - presentation is most consistent with Reed syncopal event. MRA does reveal narrowing of the basilar artery. -suspect his syncope was due to basilar insufficiency provoked by bradycardia and reduced cerebral perfusion. -bradycardia and hypotension should be avoided to allow optimal cerebral perfusion in the setting of basilar stenosis.  Basilar artery stenosis: MRA shows moderate narrowing of the basilar artery. As noted above, this likely contributed to his syncopal episode by exacerbating reduction in cerebral perfusion in the setting of bradycardia.   Right MCA aneurysm: This isan incidental finding on MRA. Recommend outpatient follow-up with neurointerventional radiology (Dr. Juliet Rude) for management recommendations.  Cerebrovascular disease on MRI:  - avoid aggressive lowering of blood pressure to allow optimal cerebral perfusion in the setting of basilar stenosis. Continue aspirin, statin.  Grade 1 diastolic dysfunction with mildly reduced EF -outpatient cardiology follow up -may need ACE added once BP improves and CR stable -lasix PRN -patient to weigh daily    03/31/2016 1st [facility] office visit/ Richard Reed   Chief Complaint  Patient presents with  . Pulmonary Consult    Referred by Marda Stalker, PA for eval of pleural effusion. He had been having SOB in Nov 2017, but this has improved with salt restriction.     Very sedentary, Not limited by breathing from desired  activities    No obvious day to day or daytime variability or assoc excess/ purulent sputum or mucus plugs or hemoptysis or cp or chest tightness, subjective wheeze or overt sinus or hb symptoms. No unusual exp hx or h/o childhood pna/ asthma or knowledge of premature birth.  Sleeping ok without nocturnal  or  early am exacerbation  of respiratory  c/o's or need for noct saba. Also denies any obvious fluctuation of symptoms with weather or environmental changes or other aggravating or alleviating factors except as outlined above   Current Medications, Allergies, Complete Past Medical History, Past Surgical History, Family History, and Social History were reviewed in [employer] record.          Review of Systems  Constitutional: Negative for activity change, appetite change, chills, fever and unexpected weight change.  HENT: Negative for congestion, dental problem, postnasal drip, rhinorrhea, sneezing, sore throat, trouble swallowing and voice change.   Eyes: Negative for visual disturbance.  Respiratory: Negative for cough, choking and shortness of breath.   Cardiovascular: Negative for chest pain and leg swelling.  Gastrointestinal: Negative for abdominal pain, nausea and vomiting.       Indigestion  Genitourinary: Negative for difficulty urinating.  Musculoskeletal: Negative for arthralgias.  Skin: Negative for rash.  Psychiatric/Behavioral: Negative for behavioral problems and confusion.       Objective:   Physical Exam  Pleasant but extremely hard of hearing  Wt Readings from Last 3 Encounters:  03/31/16 148 lb (67.1 kg)  03/30/16 146 lb (66.2 kg)  03/30/16 148 lb (67.1 kg)    Vital signs reviewed  - Note on arrival 02 sats  97% on RA     HEENT: nl dentition, turbinates, and oropharynx. Nl external ear canals without cough reflex   NECK :  without JVD/Nodes/TM/ nl carotid upstrokes bilaterally   LUNGS: no acc muscle use,  Nl contour chest with very minimal crackles both bases  without cough on insp or exp maneuvers   CV:  RRR  no s3 or murmur or increase in P2, nad no edema   ABD:  soft and nontender with nl inspiratory excursion in the supine position. No bruits or organomegaly appreciated, bowel sounds nl  MS:  Nl gait/ ext warm without  deformities, calf tenderness, cyanosis or clubbing No obvious joint restrictions   SKIN: warm and dry without lesions    NEURO:  alert, approp, nl sensorium with  no motor or cerebellar deficits apparent.     CXR PA and Lateral:   03/31/2016 :    I personally reviewed images and agree with radiology impression as follows:          Assessment & Plan:

## 2016-04-01 ENCOUNTER — Telehealth: Payer: Self-pay | Admitting: Internal Medicine

## 2016-04-01 DIAGNOSIS — I129 Hypertensive chronic kidney disease with stage 1 through stage 4 chronic kidney disease, or unspecified chronic kidney disease: Secondary | ICD-10-CM | POA: Diagnosis not present

## 2016-04-01 DIAGNOSIS — N183 Chronic kidney disease, stage 3 (moderate): Secondary | ICD-10-CM | POA: Diagnosis not present

## 2016-04-01 DIAGNOSIS — N390 Urinary tract infection, site not specified: Secondary | ICD-10-CM | POA: Diagnosis not present

## 2016-04-01 DIAGNOSIS — N200 Calculus of kidney: Secondary | ICD-10-CM | POA: Diagnosis not present

## 2016-04-01 DIAGNOSIS — N281 Cyst of kidney, acquired: Secondary | ICD-10-CM | POA: Diagnosis not present

## 2016-04-01 NOTE — Assessment & Plan Note (Signed)
Documented 08/03/10 by CT abdomen  The pattern is classic with minimal evidence of asbestosis/ILD in pt with recent hx suggestive of decompensated chf and now is back to baseline and needs no further w/u at this point as there is no assoc pleural effusion now that he has had diuresis and no wheezing either (c/w cardiac asthma, not primary airways dz and certainly not signiicant copd in this never smoker.  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   Discussed in detail all the  indications, usual  risks and alternatives  relative to the benefits with patient who agrees to proceed with conservative f/u prn   Total time devoted to counseling  > 50 % of a 45 min initial office visit:  review case with pt/fm  discussion of options/alternatives/ personally creating written customized instructions  in presence of pt  then going over those specific  Instructions directly with the pt including how to use all of the meds but in particular covering each new medication in detail and the difference between the maintenance/automatic meds and the prns using an action plan format for the latter.  Please see AVS from this visit for a full list of these instructions

## 2016-04-01 NOTE — Progress Notes (Signed)
See phone note

## 2016-04-01 NOTE — Telephone Encounter (Signed)
LMTCB

## 2016-04-01 NOTE — Progress Notes (Signed)
LMTCB

## 2016-04-04 ENCOUNTER — Telehealth: Payer: Self-pay | Admitting: Cardiology

## 2016-04-04 NOTE — Telephone Encounter (Signed)
[facility] called stating that monitor showed NSR with 8 beats of NSVT.  Patient asymptomatic.

## 2016-04-04 NOTE — Telephone Encounter (Signed)
Called and spoke with pts wife and she is aware of results of cxr. Nothing further is needed.

## 2016-04-05 ENCOUNTER — Telehealth: Payer: Self-pay | Admitting: Cardiovascular Disease

## 2016-04-05 NOTE — Telephone Encounter (Signed)
Received records from [facility] for appointment on 04/26/16 with Dr [name].  Records given to [facility] (medical records) for Dr Kennon Holter schedule on 04/26/16. lp

## 2016-04-06 ENCOUNTER — Telehealth: Payer: Self-pay | Admitting: Cardiovascular Disease

## 2016-04-06 NOTE — Addendum Note (Signed)
Addended by: Leland Johns A on: 04/06/2016 05:06 PM   Modules accepted: Orders

## 2016-04-06 NOTE — Telephone Encounter (Signed)
Received fax from [employer]--Critical Cardiac Report showing NSVT--noted by Dr. [name] today and report signed.  Pt has appt to see Dr. [name] on 04/26/16. He had a negative stress test (as noted by Murray Hodgkins, NP at last ov) on 03/30/16.   Have records from [facility].

## 2016-04-07 DIAGNOSIS — N390 Urinary tract infection, site not specified: Secondary | ICD-10-CM | POA: Diagnosis not present

## 2016-04-20 ENCOUNTER — Telehealth: Payer: Self-pay | Admitting: Cardiovascular Disease

## 2016-04-20 NOTE — Telephone Encounter (Signed)
Spoke to caller regarding pt's URI symptoms - cold, congestion, nasal drainage, etc. Denies fever. Denies chest pain or SOB.  Gave recommendations regarding avoiding decongestant medications. Advised on heart safe OTC cold meds and to follow up w primary care if symptoms fail to improve or he develops new related symptoms. She voiced understanding and thanks.

## 2016-04-20 NOTE — Telephone Encounter (Signed)
Please call,pt has a cold. She have some questions about what to do for him,she does want to interfer with his medicine.

## 2016-04-25 DIAGNOSIS — E785 Hyperlipidemia, unspecified: Secondary | ICD-10-CM | POA: Diagnosis not present

## 2016-04-25 LAB — LIPID PANEL
CHOLESTEROL: 99 mg/dL (ref <200)
HDL: 39 mg/dL — ABNORMAL LOW (ref >40)
LDL Cholesterol: 47 mg/dL (ref <100)
Total CHOL/HDL Ratio: 2.5 Ratio (ref <5.0)
Triglycerides: 67 mg/dL (ref <150)
VLDL: 13 mg/dL (ref <30)

## 2016-04-25 LAB — HEPATIC FUNCTION PANEL
ALT: 10 U/L (ref 9–46)
AST: 14 U/L (ref 10–35)
Albumin: 3.7 g/dL (ref 3.6–5.1)
Alkaline Phosphatase: 70 U/L (ref 40–115)
BILIRUBIN DIRECT: 0.3 mg/dL — AB (ref <=0.2)
BILIRUBIN INDIRECT: 0.7 mg/dL (ref 0.2–1.2)
Total Bilirubin: 1 mg/dL (ref 0.2–1.2)
Total Protein: 6.6 g/dL (ref 6.1–8.1)

## 2016-04-26 ENCOUNTER — Ambulatory Visit (INDEPENDENT_AMBULATORY_CARE_PROVIDER_SITE_OTHER): Payer: Medicare Other | Admitting: Cardiovascular Disease

## 2016-04-26 ENCOUNTER — Encounter: Payer: Self-pay | Admitting: Cardiovascular Disease

## 2016-04-26 VITALS — BP 144/76 | HR 80 | Ht 68.0 in | Wt 150.4 lb

## 2016-04-26 DIAGNOSIS — I2510 Atherosclerotic heart disease of native coronary artery without angina pectoris: Secondary | ICD-10-CM | POA: Diagnosis not present

## 2016-04-26 DIAGNOSIS — I351 Nonrheumatic aortic (valve) insufficiency: Secondary | ICD-10-CM

## 2016-04-26 DIAGNOSIS — E7800 Pure hypercholesterolemia, unspecified: Secondary | ICD-10-CM | POA: Diagnosis not present

## 2016-04-26 DIAGNOSIS — I10 Essential (primary) hypertension: Secondary | ICD-10-CM | POA: Diagnosis not present

## 2016-04-26 DIAGNOSIS — I2583 Coronary atherosclerosis due to lipid rich plaque: Secondary | ICD-10-CM

## 2016-04-26 NOTE — Patient Instructions (Signed)
Medication Instructions: Your physician recommends that you continue on your current medications as directed. Please refer to the Current Medication list given to you today.   Follow-Up: We request that you follow-up in: 3 months with Murray Hodgkins, NP and in 6 months with Dr Andria Rhein will receive a reminder letter in the mail two months in advance. If you don't receive a letter, please call our office to schedule the follow-up appointment.  If you need a refill on your cardiac medications before your next appointment, please call your pharmacy.

## 2016-04-26 NOTE — Assessment & Plan Note (Signed)
History of syncope late last year with workup including an event monitor which showed a 7 beat run of nonsustained ventricular tachycardia. It was thought that he may be dehydrated. He was begun on thought diuretics while down in [place] when he was admitted with congestive heart failure thought to be related to dietary indiscretion. Carotid Dopplers are normal. Cerebral angiography showed intracranial aneurysm and stenosis at the vertebrobasilar junction. Medical therapy was elected.

## 2016-04-26 NOTE — Assessment & Plan Note (Signed)
History of moderate aortic insufficiency and mild aortic stenosis by 2-D echocardiogram 03/01/16. His EF was in the 50% range with mild elevation in his pulmonary pressures as well.

## 2016-04-26 NOTE — Assessment & Plan Note (Signed)
History of hyperlipidemia on statin therapy with recent lipid profile performed 04/25/16 revealing total cholesterol 99, LDL 47 and HDL of 39. 

## 2016-04-26 NOTE — Assessment & Plan Note (Signed)
History of mild CAD status post cardiac catheterization performed by myself June 2012 revealing essentially normal coronary arteries with a 40-60% ostial RCA stenosis without damping.

## 2016-04-26 NOTE — Progress Notes (Signed)
04/26/2016 Richard Reed   1925/08/06  [id number]  Primary Physician Tawanna Solo, MD Primary Cardiologist: Lorretta Harp MD Lupe Carney, [place]  HPI:  The patient is a very pleasant 83 year old thin-appearing married Caucasian male, father of 67, grandfather to 87 grandchildren, who I last saw in the office 05/05/15. He is accompanied by his wife today as well as his daughter [name].Marland Kitchen He was hospitalized late 2012 with E. coli bacteremia and septic shock. He did develop some arrhythmias with PAF during that hospitalization. His EF was 45% to 50% with moderate aortic insufficiency. His other problems include hypertension and hyperlipidemia. I catheterized him in June 2012, revealing normal coronary arteries, with 40% to 60% ostial right coronary artery stenosis without damping. His right heart catheterization was unremarkable. He has had an uncomplicated laparoscopic cholecystectomy performed by Dr. Chucky May, April 12, 2011. His most recent lab work performed 04/25/16 revealed total cholesterol 99, LDL of 47 and HDL 39. He was admitted to the hospital in [place] in November of last year with congestive heart failure. He is begun on diuretics. He admits that this was probably related to dietary indiscretion. He did have an episode of syncope substantive thought to be related to dehydration. Workup included carotid Dopplers that were unremarkable. Cerebral angiography did reveal an MCA aneurysm as well as a stenosis at the vertebrobasilar junction demonstrated by angiography performed by Dr. Patrecia Pour. Medical therapy was recommended. A 2-D echo revealed low normal LV function with EF of 50%, mild AS and moderate AI with mild elevation of pulmonary artery pressures and Myoview stress test was low risk as well. Since adjusting his diet he has become the most part asymptomatic.   Current Outpatient Prescriptions  Medication Sig Dispense Refill  . aspirin EC 81 MG tablet Take 81 mg by mouth  daily.    . Calcium Citrate 200 MG TABS Take 4 tablets by mouth daily.    . carvedilol (COREG) 3.125 MG tablet Take 1 tablet (3.125 mg total) by mouth 2 (two) times daily. 60 tablet 11  . cyanocobalamin 500 MCG tablet Take 500 mcg by mouth daily.    . feeding supplement, ENSURE ENLIVE, (ENSURE ENLIVE) LIQD Take 237 mLs by mouth 2 (two) times daily between meals. 237 mL 12  . fish oil-omega-3 fatty acids 1000 MG capsule Take 1 g by mouth daily.     . Multiple Vitamin (MULTIVITAMIN) tablet Take 1 tablet by mouth daily.    . rosuvastatin (CRESTOR) 10 MG tablet Take 1 tablet (10 mg total) by mouth daily. 30 tablet 6  . tamsulosin (FLOMAX) 0.4 MG CAPS capsule Take 0.4 mg by mouth.     No current facility-administered medications for this visit.     No Known Allergies  Social History   Social History  . Marital status: Married    Spouse name: N/A  . Number of children: 2  . Years of education: HS   Occupational History  . Retired    Social History Main Topics  . Smoking status: Never Smoker  . Smokeless tobacco: Never Used  . Alcohol use No  . Drug use: No  . Sexual activity: Yes   Other Topics Concern  . Not on file   Social History Narrative   Medical Decision Maker:  RONNI DERKSEN (wife) Cell. [phone] House: [phone].   Daughter: Nigel Sloop: Cell [phone]   Full Code   Patient is Left handed.   Patient drinks 1 cup of caffeine daily.  Patient is left handed.     Review of Systems: General: negative for chills, fever, night sweats or weight changes.  Cardiovascular: negative for chest pain, dyspnea on exertion, edema, orthopnea, palpitations, paroxysmal nocturnal dyspnea or shortness of breath Dermatological: negative for rash Respiratory: negative for cough or wheezing Urologic: negative for hematuria Abdominal: negative for nausea, vomiting, diarrhea, bright red blood per rectum, melena, or hematemesis Neurologic: negative for visual changes,  syncope, or dizziness All other systems reviewed and are otherwise negative except as noted above.    Blood pressure (!) 144/76, pulse 80, height 5\' 8"  (1.727 m), weight 150 lb 6.4 oz (68.2 kg).  General appearance: alert and no distress Neck: no adenopathy, no carotid bruit, no JVD, supple, symmetrical, trachea midline and thyroid not enlarged, symmetric, no tenderness/mass/nodules Lungs: clear to auscultation bilaterally Heart: Soft outflow tract murmur, 2/6 diastolic blow at the left sternal border consistent with aortic insufficiency Extremities: extremities normal, atraumatic, no cyanosis or edema  EKG sinus rhythm at 80 with occasional PVCs and incomplete right bundle branch block. I personally reviewed this EKG  ASSESSMENT AND PLAN:   Aortic valve insufficiency, moderate History of moderate aortic insufficiency and mild aortic stenosis by 2-D echocardiogram 03/01/16. His EF was in the 50% range with mild elevation in his pulmonary pressures as well.  Hyperlipidemia History of hyperlipidemia on statin therapy with recent lipid profile performed 04/25/16 revealing total cholesterol 99, LDL 47 and HDL of 39  Syncope History of syncope late last year with workup including an event monitor which showed a 7 beat run of nonsustained ventricular tachycardia. It was thought that he may be dehydrated. He was begun on thought diuretics while down in [place] when he was admitted with congestive heart failure thought to be related to dietary indiscretion. Carotid Dopplers are normal. Cerebral angiography showed intracranial aneurysm and stenosis at the vertebrobasilar junction. Medical therapy was elected.  Coronary artery disease due to lipid rich plaque History of mild CAD status post cardiac catheterization performed by myself June 2012 revealing essentially normal coronary arteries with a 40-60% ostial RCA stenosis without damping.      Lorretta Harp MD FACP,FACC,FAHA,  [facility] 04/26/2016 3:20 PM

## 2016-04-28 ENCOUNTER — Encounter: Payer: Self-pay | Admitting: Neurology

## 2016-04-28 ENCOUNTER — Ambulatory Visit (INDEPENDENT_AMBULATORY_CARE_PROVIDER_SITE_OTHER): Payer: Medicare Other | Admitting: Neurology

## 2016-04-28 VITALS — BP 125/70 | HR 44 | Ht 68.0 in | Wt 148.0 lb

## 2016-04-28 DIAGNOSIS — H539 Unspecified visual disturbance: Secondary | ICD-10-CM | POA: Diagnosis not present

## 2016-04-28 DIAGNOSIS — R4020 Unspecified coma: Secondary | ICD-10-CM

## 2016-04-28 DIAGNOSIS — G450 Vertebro-basilar artery syndrome: Secondary | ICD-10-CM | POA: Insufficient documentation

## 2016-04-28 HISTORY — DX: Vertebro-basilar artery syndrome: G45.0

## 2016-04-28 NOTE — Progress Notes (Signed)
Reason for visit: Visual disturbance  Richard Reed is an 83 y.o. male  History of present illness:  Richard Reed is an 83 year old left-handed white male with a history of vertical nystagmus associated with gait instability and oscillopsia. The patient was recently admitted to the hospital on 02/28/2016 with an episode of syncope that occurred while in church. The patient had been in [place] the week prior, he was admitted to the hospital down there with a pleural effusion. The patient had been placed on a diuretic, and apparently he became dehydrated on this. The patient underwent a workup in the hospital that included a MRI of the brain that shows a mild to moderate level small vessel disease, no acute strokes were seen. MRA of the head was done and showed vertebrobasilar insufficiency. He underwent a cerebral angiogram that confirmed a distal high-grade stenosis of the basilar artery and of the left vertebral artery at the vertebrobasilar junction. The patient also had a 4 by 11 mm fusiform aneurysm in the M1 segment of the right middle cerebral artery. No treatment was recommended for this. An EEG study in the hospital was normal. The patient has remained on aspirin. It was felt that this syncopal event was a result of a combination of dehydration, low blood pressure, and the vertebrobasilar insufficiency. The patient has had no long-lasting residual from this event. He returns to this office for an evaluation.  Past Medical History:  Diagnosis Date  . Angina   . Aortic valve insufficiency, moderate    a. 02/2016 Echo: mild AS, mild to mod AI.  Marland Kitchen Arthritis   . Arthritis pain   . CAD (coronary artery disease)    a. 09/2010 Cath: RCA 40-60 ost, otw nl cors.  . Cancer ([place])   . Cardiomyopathy ([place])    a. 02/2016 Echo: EF 45-50%, diff HK.  . Carotid disease, bilateral ([place])    a. 02/2016 Carotid U/S: 1-39% bilat ICA plaquing.  . Cerebral aneurysm    a. 02/2016 MRA: fusiform bilobed  aneurysm @ R MCA bifurcation.  . Cerebrovascular disease    a. 02/2016 MRA: long segment narrowing of basilar artery, moderate bilat posterior inferior cerebellar artery dzs.  . Chronic combined systolic and diastolic CHF (congestive heart failure) ([place])    a. 02/2016 Admitted to hospital in [place] w/ CHF;  b. 02/2016 Echo ([facility]): EF 45-50%, diff HK, gr1DD, mild AS, mild to mod AI, mildly dil Ao root, PASP 93mmHg.  . CKD (chronic kidney disease), stage III   . Essential hypertension   . Hard of hearing   . Hemiparesis and alteration of sensations as late effects of stroke ([place]) 04/29/2015  . Hiatal hernia   . HOH (hard of hearing)   . Hyperlipidemia   . Kidney stones   . NSVT (nonsustained ventricular tachycardia) ([place])    a. 03/2016 noted on event monitor.  Marland Kitchen PAF (paroxysmal atrial fibrillation) ([place])    a. 2012 - noted in setting of E coli bacteremia and septic shock.  . Pneumonia 03/23/2011   "first time"  . Prostate cancer ([place])   . Sepsis due to Escherichia coli ([place]) 03/24/11  . Syncope    a. 02/2016 presumed to be 2/2 hypotension/dehydration and basilar artery dzs noted on MRA.  Marland Kitchen TIA (transient ischemic attack)   . Vertebrobasilar insufficiency 04/28/2016  . Visual disturbance 03/19/2014    Past Surgical History:  Procedure Laterality Date  . CARDIAC CATHETERIZATION  09/2010   non obstructive CAD, 40-60% ostial  RCA stenosis without dampening   . CATARACT EXTRACTION Bilateral   . CHOLECYSTECTOMY  04/15/2011   Procedure: LAPAROSCOPIC CHOLECYSTECTOMY WITH INTRAOPERATIVE CHOLANGIOGRAM;  Surgeon: Belva Crome, MD;  Location: [facility];  Service: General;  Laterality: N/A;  . IR GENERIC HISTORICAL  03/17/2016   IR ANGIO VERTEBRAL SEL SUBCLAVIAN INNOMINATE BILAT MOD SED 03/17/2016 [name], MD [facility]  . IR GENERIC HISTORICAL  03/17/2016   IR ANGIO INTRA EXTRACRAN SEL COM CAROTID INNOMINATE BILAT MOD SED 03/17/2016 [name], MD [facility]  . PROSTATE  SURGERY     approx 10 years ago, seed implant radiation  . TONSILLECTOMY      Family History  Problem Relation Age of Onset  . Heart failure Mother   . Heart disease Mother   . Heart disease Father   . Cancer Brother     prostate    Social history:  reports that he has never smoked. He has never used smokeless tobacco. He reports that he does not drink alcohol or use drugs.   No Known Allergies  Medications:  Prior to Admission medications   Medication Sig Start Date End Date Taking? Authorizing Provider  aspirin EC 81 MG tablet Take 81 mg by mouth daily.   Yes Historical Provider, MD  Calcium Citrate 200 MG TABS Take 4 tablets by mouth daily.   Yes Historical Provider, MD  carvedilol (COREG) 3.125 MG tablet Take 1 tablet (3.125 mg total) by mouth 2 (two) times daily. 03/30/16 06/28/16 Yes Rogelia Mire, NP  cyanocobalamin 500 MCG tablet Take 500 mcg by mouth daily.   Yes Historical Provider, MD  feeding supplement, ENSURE ENLIVE, (ENSURE ENLIVE) LIQD Take 237 mLs by mouth 2 (two) times daily between meals. 03/02/16  Yes Geradine Girt, DO  fish oil-omega-3 fatty acids 1000 MG capsule Take 1 g by mouth daily.    Yes Historical Provider, MD  Multiple Vitamin (MULTIVITAMIN) tablet Take 1 tablet by mouth daily.   Yes Historical Provider, MD  rosuvastatin (CRESTOR) 10 MG tablet Take 1 tablet (10 mg total) by mouth daily. 03/09/16 06/07/16 Yes Rogelia Mire, NP  tamsulosin (FLOMAX) 0.4 MG CAPS capsule Take 0.4 mg by mouth.   Yes Historical Provider, MD    ROS:  Out of a complete 14 system review of symptoms, the patient complains only of the following symptoms, and all other reviewed systems are negative.  Gait instability  Blood pressure 125/70, pulse (!) 44, height 5\' 8"  (1.727 m), weight 148 lb (67.1 kg).  Physical Exam  General: The patient is alert and cooperative at the time of the examination.  Skin: No significant peripheral edema is noted.   Neurologic  Exam  Mental status: The patient is alert and oriented x 3 at the time of the examination. The patient has apparent normal recent and remote memory, with an apparently normal attention span and concentration ability.   Cranial nerves: Facial symmetry is present. Speech is normal, no aphasia or dysarthria is noted. Extraocular movements are full. Visual fields are full. With lateral gaze to the right or left, downbeat nystagmus is seen. No nystagmus is seen with primary gaze. The patient is hard of hearing.  Motor: The patient has good strength in all 4 extremities.  Sensory examination: Soft touch sensation is symmetric on the face, arms, and legs.  Coordination: The patient has good finger-nose-finger and heel-to-shin bilaterally.  Gait and station: The patient has a slightly wide-based gait, the patient can walk independently. Tandem  gait is unsteady. Romberg is negative. No drift is seen.  Reflexes: Deep tendon reflexes are symmetric.   MRI brain 02/29/16:  IMPRESSION: MRI HEAD  No acute infarct or intracranial hemorrhage.  Moderate chronic microvascular changes.  Global atrophy without hydrocephalus.  * MRI scan images were reviewed online. I agree with the written report.   MRA head 02/29/16:  MRA HEAD  Marked long segment narrowing basilar artery.  Neither vertebral artery is not visualized after takeoff of the posterior inferior cerebellar artery.  Moderate narrowing of portions of the posterior inferior cerebellar artery bilaterally.  Nonvisualized anterior inferior cerebellar arteries bilaterally.  Moderate narrowing superior cerebellar arteries bilaterally.  Fetal contribution to the posterior cerebral arteries bilaterally  Fusiform bilobed aneurysm right middle cerebral artery bifurcation.  * MRI scan images were reviewed online. I agree with the written report.    Assessment/Plan:  1. Vertebrobasilar insufficiency  2. Downbeat  nystagmus, oscillopsia  3. Gait disorder  The patient is to remain on aspirin at this time. The patient will stay well hydrated, he will follow-up in one year, sooner if needed. No other specific therapies are recommended for the vertebrobasilar insufficiency  C. Floyde Parkins MD 04/28/2016 8:40 AM  [facility] [address] [place], [place] [address]  Phone [phone] Fax [phone]

## 2016-05-09 NOTE — Addendum Note (Signed)
Addended by: Waylan Rocher on: 05/09/2016 10:14 AM   Modules accepted: Orders

## 2016-06-04 DIAGNOSIS — N183 Chronic kidney disease, stage 3 (moderate): Secondary | ICD-10-CM | POA: Diagnosis not present

## 2016-06-07 DIAGNOSIS — N200 Calculus of kidney: Secondary | ICD-10-CM | POA: Diagnosis not present

## 2016-06-07 DIAGNOSIS — I129 Hypertensive chronic kidney disease with stage 1 through stage 4 chronic kidney disease, or unspecified chronic kidney disease: Secondary | ICD-10-CM | POA: Diagnosis not present

## 2016-06-07 DIAGNOSIS — N183 Chronic kidney disease, stage 3 (moderate): Secondary | ICD-10-CM | POA: Diagnosis not present

## 2016-06-07 DIAGNOSIS — M25512 Pain in left shoulder: Secondary | ICD-10-CM | POA: Diagnosis not present

## 2016-07-20 ENCOUNTER — Encounter: Payer: Self-pay | Admitting: Nurse Practitioner

## 2016-07-20 ENCOUNTER — Ambulatory Visit (INDEPENDENT_AMBULATORY_CARE_PROVIDER_SITE_OTHER): Payer: Medicare Other | Admitting: Nurse Practitioner

## 2016-07-20 VITALS — BP 122/64 | HR 60 | Ht 68.0 in | Wt 150.0 lb

## 2016-07-20 DIAGNOSIS — E785 Hyperlipidemia, unspecified: Secondary | ICD-10-CM | POA: Diagnosis not present

## 2016-07-20 DIAGNOSIS — I5042 Chronic combined systolic (congestive) and diastolic (congestive) heart failure: Secondary | ICD-10-CM | POA: Diagnosis not present

## 2016-07-20 DIAGNOSIS — I428 Other cardiomyopathies: Secondary | ICD-10-CM | POA: Diagnosis not present

## 2016-07-20 DIAGNOSIS — M25552 Pain in left hip: Secondary | ICD-10-CM

## 2016-07-20 DIAGNOSIS — I480 Paroxysmal atrial fibrillation: Secondary | ICD-10-CM | POA: Diagnosis not present

## 2016-07-20 DIAGNOSIS — M25551 Pain in right hip: Secondary | ICD-10-CM | POA: Diagnosis not present

## 2016-07-20 DIAGNOSIS — I10 Essential (primary) hypertension: Secondary | ICD-10-CM | POA: Diagnosis not present

## 2016-07-20 NOTE — Patient Instructions (Signed)
Medication Instructions:   HOLD ROSUVASTATIN FOR 2 WEEKS- IF NO CHANGE IN SYMPTOMS, RESTART AT PREVIOUS DOSE. IF SYMPTOMS IMPROVE OFF THE ROSUVASTATIN PLEASE LET us KNOW  Follow-Up:  Your physician recommends that you schedule a follow-up appointment in: [place]

## 2016-07-20 NOTE — Progress Notes (Signed)
Office Visit    Patient Name: Richard Reed Date of Encounter: 07/20/2016  Primary Care Provider:  Tawanna Solo, MD Primary Cardiologist:  Adora Fridge, MD   Chief Complaint    83 year old male with a history of nonobstructive coronary artery disease, hypertension, hyperlipidemia, paroxysmal atrial fibrillation, and cerebrovascular disease, who presents for follow-up.  Past Medical History    Past Medical History:  Diagnosis Date  . Angina   . Aortic valve insufficiency, moderate    a. 02/2016 Echo: mild AS, mild to mod AI.  Marland Kitchen Arthritis   . Arthritis pain   . CAD (coronary artery disease)    a. 09/2010 Cath: RCA 40-60 ost, otw nl cors;  b. 03/2016 Lexiscan MV: EF 46%, no ischemia, low risk.  . Cancer ([place])   . Carotid disease, bilateral ([place])    a. 02/2016 Carotid U/S: 1-39% bilat ICA plaquing.  . Cerebral aneurysm    a. 02/2016 MRA: fusiform bilobed aneurysm @ R MCA bifurcation.  . Cerebrovascular disease    a. 02/2016 MRA: long segment narrowing of basilar artery, moderate bilat posterior inferior cerebellar artery dzs.  . Chronic combined systolic and diastolic CHF (congestive heart failure) ([place])    a. 02/2016 Admitted to hospital in [place] w/ CHF;  b. 02/2016 Echo ([facility]): EF 45-50%, diff HK, gr1DD, mild AS, mild to mod AI, mildly dil Ao root, PASP 53mmHg.  . CKD (chronic kidney disease), stage III   . Essential hypertension   . Hard of hearing   . Hemiparesis and alteration of sensations as late effects of stroke ([place]) 04/29/2015  . Hiatal hernia   . HOH (hard of hearing)   . Hyperlipidemia   . Kidney stones   . NICM (nonischemic cardiomyopathy) ([place])    a. 02/2016 Echo: EF 45-50%, diff HK;  b. 03/2016 Lexiscan MV: No ischemia, EF 46%.  Marland Kitchen NSVT (nonsustained ventricular tachycardia) ([place])    a. 03/2016 noted on event monitor.  Marland Kitchen PAF (paroxysmal atrial fibrillation) ([place])    a. 2012 - noted in setting of E coli bacteremia and septic shock.  . Pneumonia 03/23/2011     "first time"  . Prostate cancer ([place])   . Sepsis due to Escherichia coli ([place]) 03/24/11  . Syncope    a. 02/2016 presumed to be 2/2 hypotension/dehydration and basilar artery dzs noted on MRA.  Marland Kitchen TIA (transient ischemic attack)   . Vertebrobasilar insufficiency 04/28/2016  . Visual disturbance 03/19/2014   Past Surgical History:  Procedure Laterality Date  . CARDIAC CATHETERIZATION  09/2010   non obstructive CAD, 40-60% ostial RCA stenosis without dampening   . CATARACT EXTRACTION Bilateral   . CHOLECYSTECTOMY  04/15/2011   Procedure: LAPAROSCOPIC CHOLECYSTECTOMY WITH INTRAOPERATIVE CHOLANGIOGRAM;  Surgeon: Belva Crome, MD;  Location: [facility];  Service: General;  Laterality: N/A;  . IR GENERIC HISTORICAL  03/17/2016   IR ANGIO VERTEBRAL SEL SUBCLAVIAN INNOMINATE BILAT MOD SED 03/17/2016 [name], MD [facility]  . IR GENERIC HISTORICAL  03/17/2016   IR ANGIO INTRA EXTRACRAN SEL COM CAROTID INNOMINATE BILAT MOD SED 03/17/2016 [name], MD [facility]  . PROSTATE SURGERY     approx 10 years ago, seed implant radiation  . TONSILLECTOMY      Allergies  No Known Allergies  History of Present Illness    83 year old male with the above complex past medical history including nonobstructive CAD by catheterization in June 2012, paroxysmal atrial fibrillation in the setting of sepsis in 2012, hypertension, hyperlipidemia, mild  aortic stenosis, mild to moderate aortic insufficiency, mild carotid vascular disease, and diffuse cerebrovascular disease. He was admitted to a hospital while traveling in [place] in November 2017 in the setting of dyspnea and volume overload. Echo showed an EF of 45-50% with diffuse hypokinesis. He was placed on evidence based medical therapy and subsequently discharged and advised to follow-up with his cardiologist in [employer]. In November 2017, he was seen in the emergency department following a syncopal spell that occurred at church. He was  relatively hypotensive in the emergency department. Creatinine was also elevated at 2.04. CT of the head was negative for acute stroke. MRI and MRA showed diffuse cerebrovascular disease with long segment basilar arterial disease and also a right MCA fusiform aneurysm. Carotid ultrasounds showed minimal disease. In the setting of relative hypotension, beta blocker and ACE inhibitor were discontinued and he was advised to take Lasix on an as-needed basis. He subsequently underwent outpatient event monitoring and also stress testing. That monitoring did show a 7 beat run of nonsustained VT, which was asymptomatic. Beta blocker therapy was resumed. Stress testing was negative for ischemia.  Since his last clinic visit with Dr. [name] in January, he has done well. He is exercising a few days a week and also walking regularly. He denies chest pain, dyspnea, PND, orthopnea, dizziness, syncope, edema, palpitations, or early satiety.  he has had bilateral hip and thigh discomfort with activity. He says he's had left hip pain for many years but only over the past few weeks to months hasn't been both hips. This does not occur with rest. Pain does seem to improve with Tylenol. He has been weighing himself daily and his weight has been stable.   Home Medications    Prior to Admission medications   Medication Sig Start Date End Date Taking? Authorizing Provider  aspirin EC 81 MG tablet Take 81 mg by mouth daily.   Yes Historical Provider, MD  Calcium Citrate 200 MG TABS Take 4 tablets by mouth daily.   Yes Historical Provider, MD  cyanocobalamin 500 MCG tablet Take 500 mcg by mouth daily.   Yes Historical Provider, MD  feeding supplement, ENSURE ENLIVE, (ENSURE ENLIVE) LIQD Take 237 mLs by mouth 2 (two) times daily between meals. 03/02/16  Yes Geradine Girt, DO  fish oil-omega-3 fatty acids 1000 MG capsule Take 1 g by mouth daily.    Yes Historical Provider, MD  Multiple Vitamin (MULTIVITAMIN) tablet Take 1 tablet  by mouth daily.   Yes Historical Provider, MD  tamsulosin (FLOMAX) 0.4 MG CAPS capsule Take 0.4 mg by mouth.   Yes Historical Provider, MD  carvedilol (COREG) 3.125 MG tablet Take 1 tablet (3.125 mg total) by mouth 2 (two) times daily. 03/30/16 06/28/16  Rogelia Mire, NP  rosuvastatin (CRESTOR) 10 MG tablet Take 1 tablet (10 mg total) by mouth daily. 03/09/16 06/07/16  Rogelia Mire, NP    Review of Systems    As above, he has been having bilateral hip and thigh pain which is more likely to occur with activity and does seem to improve with Tylenol. He has chronic left hip pain. Right-sided pain only started or worsened a few weeks ago.  All other systems reviewed and are otherwise negative except as noted above.  Physical Exam    VS:  BP 122/64   Pulse 60   Ht 5\' 8"  (1.727 m)   Wt 150 lb (68 kg)   BMI 22.81 kg/m  , BMI Body mass index is  22.81 kg/m. GEN: Well nourished, well developed, in no acute distress.  HEENT: normal. He is very hard of hearing. Neck: Supple, no JVD, carotid bruits, or masses. Cardiac: KPT4/6 systolic and diastolic murmur noted at the upper sternal borders with systolic murmur noted throughout, no rubs, or gallops. No clubbing, cyanosis, edema.  Radials/DP/PT 2+ and equal bilaterally.  Respiratory:  Respirations regular and unlabored, clear to auscultation bilaterally. GI: Soft, nontender, nondistended, BS + x 4. MS: no deformity or atrophy. Skin: warm and dry, no rash. Neuro:  Strength and sensation are intact. Psych: Normal affect.  Accessory Clinical Findings    None   Assessment & Plan    1.  Nonischemic cardiomyopathy/chronic combined systolic and diastolic congestive heart failure: Echo in November 2017 showed an EF of 45-50%. Follow-up stress testing was negative for ischemia. He has done quite well without dyspnea or weight gain. His wife has been watching his sodium intake closely. He is on beta blocker therapy and is tolerating this well.  He had previously been on ACE inhibitor, spironolactone, and Lasix, however developed syncope in the setting of relative hypotension and acute kidney injury in November 2017 resulting in discontinuation of those medications. No changes today.    2. Bilateral hip and thigh pain. Patient has a long history of left hip pain and had previously been told he has arthritis. Over the past few weeks to months, he has noticed bilateral hip and thigh pain. This does seem to worsen with activity. We discussed options for evaluation including arterial brachial indices. Patient is not interested in this at this time. As there is some temporal relationship between initiation of Crestor and worsening of symptoms, I have asked him to hold his Crestor for 2 weeks to see if symptoms improve. If so, he is to contact us so we may consider an alternate statin. If symptoms do not improve, he will resume Crestor. If at any point symptoms worsen, he is advised to contact us so we can reconsider ABIs. Symptoms do seem to improve somewhat with Tylenol, and ultimately, this may be progressive arthritis.  3. Essential hypertension: Blood pressure stable on beta blocker therapy.  4. Hyperlipidemia: LDL in January was 47. As above, he will hold Crestor for 2 weeks to see if hip and thigh pain symptoms improved.  5. Paroxysmal atrial fibrillation: No recent palpitations. No evidence of atrial fibrillation on event monitor worn in December. He has never been on oral anticoagulation as atrial fibrillation was only documented on one occasion in the setting of sepsis. Continue beta blocker and aspirin therapy.  6. Stage 3-4 chronic kidney disease: Creatinine 1.48 in November 2017.  7. Cerebrovascular disease with right MCA aneurysm: Noted on MRA during hospitalization November 2017. He did follow-up with interventional radiology and medical therapy was recommended.  8. Disposition: Patient will follow-up with Dr. [name] in 3 months as  previously planned.   Murray Hodgkins, NP 07/20/2016, 9:09 AM

## 2016-08-23 ENCOUNTER — Other Ambulatory Visit (HOSPITAL_COMMUNITY): Payer: Self-pay | Admitting: Interventional Radiology

## 2016-08-23 DIAGNOSIS — I729 Aneurysm of unspecified site: Secondary | ICD-10-CM

## 2016-09-16 ENCOUNTER — Ambulatory Visit (HOSPITAL_COMMUNITY): Payer: Medicare Other

## 2016-09-16 ENCOUNTER — Encounter (HOSPITAL_COMMUNITY): Payer: Self-pay

## 2016-09-16 ENCOUNTER — Ambulatory Visit (HOSPITAL_COMMUNITY)
Admission: RE | Admit: 2016-09-16 | Discharge: 2016-09-16 | Disposition: A | Discharge status: Home / Self Care | Payer: Medicare Other | Source: Ambulatory Visit | Attending: Interventional Radiology | Admitting: Interventional Radiology

## 2016-09-16 ENCOUNTER — Encounter (HOSPITAL_COMMUNITY): Payer: Self-pay | Admitting: Radiology

## 2016-09-16 DIAGNOSIS — I729 Aneurysm of unspecified site: Secondary | ICD-10-CM | POA: Insufficient documentation

## 2016-09-16 LAB — CREATININE, SERUM
Creatinine, Ser: 1.52 mg/dL — ABNORMAL HIGH (ref 0.61–1.24)
GFR calc non Af Amer: 39 mL/min — ABNORMAL LOW (ref >60)
GFR, EST AFRICAN AMERICAN: 45 mL/min — AB (ref >60)

## 2016-09-16 MED ORDER — GADOBENATE DIMEGLUMINE 529 MG/ML IV SOLN
15.0000 mL | Freq: Once | INTRAVENOUS | Status: AC | PRN
  Administered 2016-09-16: 15 mL via INTRAVENOUS

## 2016-09-19 ENCOUNTER — Other Ambulatory Visit (HOSPITAL_COMMUNITY): Payer: Self-pay | Admitting: Interventional Radiology

## 2016-09-21 ENCOUNTER — Telehealth (HOSPITAL_COMMUNITY): Payer: Self-pay

## 2016-09-21 NOTE — Telephone Encounter (Signed)
Left message for pt to return call. AW 

## 2016-09-27 ENCOUNTER — Other Ambulatory Visit (HOSPITAL_COMMUNITY): Payer: Self-pay | Admitting: Interventional Radiology

## 2016-09-27 DIAGNOSIS — I771 Stricture of artery: Secondary | ICD-10-CM

## 2016-09-27 DIAGNOSIS — I729 Aneurysm of unspecified site: Secondary | ICD-10-CM

## 2016-10-08 DIAGNOSIS — N183 Chronic kidney disease, stage 3 (moderate): Secondary | ICD-10-CM | POA: Diagnosis not present

## 2016-10-10 ENCOUNTER — Encounter (HOSPITAL_COMMUNITY): Payer: Medicare Other

## 2016-10-11 DIAGNOSIS — I129 Hypertensive chronic kidney disease with stage 1 through stage 4 chronic kidney disease, or unspecified chronic kidney disease: Secondary | ICD-10-CM | POA: Diagnosis not present

## 2016-10-11 DIAGNOSIS — N200 Calculus of kidney: Secondary | ICD-10-CM | POA: Diagnosis not present

## 2016-10-11 DIAGNOSIS — N183 Chronic kidney disease, stage 3 (moderate): Secondary | ICD-10-CM | POA: Diagnosis not present

## 2016-10-12 ENCOUNTER — Ambulatory Visit (HOSPITAL_COMMUNITY)
Admission: RE | Admit: 2016-10-12 | Discharge: 2016-10-12 | Disposition: A | Discharge status: Home / Self Care | Payer: Medicare Other | Source: Ambulatory Visit | Attending: Interventional Radiology | Admitting: Interventional Radiology

## 2016-10-12 DIAGNOSIS — I729 Aneurysm of unspecified site: Secondary | ICD-10-CM | POA: Diagnosis not present

## 2016-10-12 DIAGNOSIS — I771 Stricture of artery: Secondary | ICD-10-CM

## 2016-10-12 LAB — VAS US CAROTID
LCCADDIAS: -14 cm/s
LCCAPSYS: -185 cm/s
LEFT ECA DIAS: -2 cm/s
LEFT VERTEBRAL DIAS: -15 cm/s
LICADDIAS: 24 cm/s
LICADSYS: 80 cm/s
LICAPDIAS: -35 cm/s
LICAPSYS: -124 cm/s
Left CCA dist sys: -72 cm/s
Left CCA prox dias: -17 cm/s
RIGHT ECA DIAS: -2 cm/s
RIGHT VERTEBRAL DIAS: -12 cm/s
Right CCA prox dias: 15 cm/s
Right CCA prox sys: 87 cm/s
Right cca dist sys: -46 cm/s

## 2016-10-12 NOTE — Progress Notes (Signed)
VASCULAR LAB PRELIMINARY  PRELIMINARY  PRELIMINARY  PRELIMINARY  Carotid duplex completed.    Preliminary report:  Bilateral:  1-39% ICA stenosis.  Vertebral artery flow is antegrade.     Zackry Deines, RVS 10/12/2016, 12:50 PM

## 2016-10-14 ENCOUNTER — Telehealth (HOSPITAL_COMMUNITY): Payer: Self-pay

## 2016-10-14 NOTE — Telephone Encounter (Signed)
Pt's wife agreed to have pt f/u in 6 months with us carotid. AW 

## 2016-10-17 ENCOUNTER — Other Ambulatory Visit: Payer: Self-pay | Admitting: Nurse Practitioner

## 2016-10-25 ENCOUNTER — Encounter: Payer: Self-pay | Admitting: Cardiovascular Disease

## 2016-10-25 ENCOUNTER — Ambulatory Visit (INDEPENDENT_AMBULATORY_CARE_PROVIDER_SITE_OTHER): Payer: Medicare Other | Admitting: Cardiovascular Disease

## 2016-10-25 VITALS — BP 100/80 | HR 76 | Ht 68.0 in | Wt 150.0 lb

## 2016-10-25 DIAGNOSIS — I10 Essential (primary) hypertension: Secondary | ICD-10-CM

## 2016-10-25 DIAGNOSIS — I480 Paroxysmal atrial fibrillation: Secondary | ICD-10-CM | POA: Diagnosis not present

## 2016-10-25 DIAGNOSIS — I2583 Coronary atherosclerosis due to lipid rich plaque: Secondary | ICD-10-CM

## 2016-10-25 DIAGNOSIS — I351 Nonrheumatic aortic (valve) insufficiency: Secondary | ICD-10-CM

## 2016-10-25 DIAGNOSIS — I2510 Atherosclerotic heart disease of native coronary artery without angina pectoris: Secondary | ICD-10-CM | POA: Diagnosis not present

## 2016-10-25 DIAGNOSIS — E7800 Pure hypercholesterolemia, unspecified: Secondary | ICD-10-CM

## 2016-10-25 NOTE — Patient Instructions (Signed)
Medication Instructions: Your physician recommends that you continue on your current medications as directed. Please refer to the Current Medication list given to you today.   Follow-Up: We request that you follow-up in: 6 months with Murray Hodgkins, NP and in 12 months with Dr Andria Rhein will receive a reminder letter in the mail two months in advance. If you don't receive a letter, please call our office to schedule the follow-up appointment.  If you need a refill on your cardiac medications before your next appointment, please call your pharmacy.

## 2016-10-25 NOTE — Progress Notes (Signed)
10/25/2016 Richard Reed   18-May-1925  [id number]  Primary Physician Kathyrn Lass, MD Primary Cardiologist: Lorretta Harp MD Lupe Carney, [place]  HPI:  The patient is a very pleasant 83 year old thin-appearing married Caucasian male, father of 65, grandfather to 27 grandchildren, who I last saw in the office 04/26/16. He is accompanied by his wife today. He was hospitalized late 2012 with E. coli bacteremia and septic shock. He did develop some arrhythmias with PAF during that hospitalization. His EF was 45% to 50% with moderate aortic insufficiency. His other problems include hypertension and hyperlipidemia. I catheterized him in June 2012, revealing normal coronary arteries, with 40% to 60% ostial right coronary artery stenosis without damping. His right heart catheterization was unremarkable. He has had an uncomplicated laparoscopic cholecystectomy performed by Dr. Chucky May, April 12, 2011. His most recent lab work performed 04/25/16 revealed total cholesterol 99, LDL of 47 and HDL 39. He was admitted to the hospital in [place] in November of last year with congestive heart failure. He is begun on diuretics. He admits that this was probably related to dietary indiscretion. He did have an episode of syncope substantive thought to be related to dehydration. Workup included carotid Dopplers that were unremarkable. Cerebral angiography did reveal an MCA aneurysm as well as a stenosis at the vertebrobasilar junction demonstrated by angiography performed by Dr. Patrecia Pour. Medical therapy was recommended. A 2-D echo revealed low normal LV function with EF of 50%, mild AS and moderate AI with mild elevation of pulmonary artery pressures and Myoview stress test was low risk as well. Since adjusting his diet he has become for  the most part asymptomatic. He did see Ignacia Bayley NP in the office 07/20/16 which time he was cardiac stable what was complaining of some left hip pain with ambulation. They did  change the insoles and shoes which somewhat helped and try to statin holiday which did not impact his pain and therefore was placed back on statin therapy.   Current Outpatient Prescriptions  Medication Sig Dispense Refill  . aspirin EC 81 MG tablet Take 81 mg by mouth daily.    . Calcium Citrate 200 MG TABS Take 4 tablets by mouth daily.    . carvedilol (COREG) 3.125 MG tablet Take 1 tablet (3.125 mg total) by mouth 2 (two) times daily. 60 tablet 11  . cyanocobalamin 500 MCG tablet Take 500 mcg by mouth daily.    . feeding supplement, ENSURE ENLIVE, (ENSURE ENLIVE) LIQD Take 237 mLs by mouth 2 (two) times daily between meals. 237 mL 12  . fish oil-omega-3 fatty acids 1000 MG capsule Take 1 g by mouth daily.     . Multiple Vitamin (MULTIVITAMIN) tablet Take 1 tablet by mouth daily.    . rosuvastatin (CRESTOR) 10 MG tablet TAKE ONE (1) TABLET BY MOUTH EVERY DAY 90 tablet 2  . tamsulosin (FLOMAX) 0.4 MG CAPS capsule Take 0.4 mg by mouth.     No current facility-administered medications for this visit.     No Known Allergies  Social History   Social History  . Marital status: Married    Spouse name: N/A  . Number of children: 2  . Years of education: HS   Occupational History  . Retired    Social History Main Topics  . Smoking status: Never Smoker  . Smokeless tobacco: Never Used  . Alcohol use No  . Drug use: No  . Sexual activity: Yes   Other Topics Concern  .  Not on file   Social History Narrative   Medical Decision Maker:  Richard Reed (wife) Cell. [phone] House: [phone].   Daughter: Richard Reed: Cell [phone]   Full Code   Patient is Left handed.   Patient drinks 1 cup of caffeine daily.   Patient is left handed.     Review of Systems: General: negative for chills, fever, night sweats or weight changes.  Cardiovascular: negative for chest pain, dyspnea on exertion, edema, orthopnea, palpitations, paroxysmal nocturnal dyspnea or shortness of  breath Dermatological: negative for rash Respiratory: negative for cough or wheezing Urologic: negative for hematuria Abdominal: negative for nausea, vomiting, diarrhea, bright red blood per rectum, melena, or hematemesis Neurologic: negative for visual changes, syncope, or dizziness All other systems reviewed and are otherwise negative except as noted above.    Blood pressure 100/80, pulse 76, height 5\' 8"  (1.727 m), weight 150 lb (68 kg).  General appearance: alert and no distress Neck: no adenopathy, no carotid bruit, no JVD, supple, symmetrical, trachea midline and thyroid not enlarged, symmetric, no tenderness/mass/nodules Lungs: clear to auscultation bilaterally Heart: regular rate and rhythm, S1, S2 normal, no murmur, click, rub or gallop Extremities: extremities normal, atraumatic, no cyanosis or edema  EKG sinus rhythm with PVCs. I personally reviewed this EKG.  ASSESSMENT AND PLAN:   PAF (paroxysmal atrial fibrillation) History of paroxysmal atrial fibrillation occurring once in the setting of sepsis but not since therefore not on oral anticoagulation.  Aortic valve insufficiency, moderate History of mild to moderate aortic insufficiency by 2-D echo performed 03/01/16. Patient is asymptomatic. He has mild aortic stenosis as well with a valve area of 1.547 m and a peak gradient of 37 mmHg.  Essential hypertension History of essential hypertension with blood pressure measures 100/80. He is on carvedilol. Continue current meds at current dosing  Hyperlipidemia History of hyperlipidemia on statin therapy with recent lipid profile performed 04/25/16 revealing total cholesterol 99, LDL 47 and HDL of 39.  Coronary artery disease due to lipid rich plaque History of noncritical CAD by cardiac catheterization which I performed June 2012 40-60% ostial RCA stenosis without damping and normal LV function. The patient denies chest pain or shortness of breath.      Lorretta Harp  MD FACP,FACC,FAHA, [facility] 10/25/2016 9:29 AM

## 2016-10-25 NOTE — Assessment & Plan Note (Signed)
History of hyperlipidemia on statin therapy with recent lipid profile performed 04/25/16 revealing total cholesterol 99, LDL 47 and HDL of 39.

## 2016-10-25 NOTE — Assessment & Plan Note (Signed)
History of mild to moderate aortic insufficiency by 2-D echo performed 03/01/16. Patient is asymptomatic. He has mild aortic stenosis as well with a valve area of 1.547 m and a peak gradient of 37 mmHg.

## 2016-10-25 NOTE — Assessment & Plan Note (Addendum)
History of paroxysmal atrial fibrillation occurring once in the setting of sepsis but not since therefore not on oral anticoagulation.

## 2016-10-25 NOTE — Assessment & Plan Note (Signed)
History of essential hypertension with blood pressure measures 100/80. He is on carvedilol. Continue current meds at current dosing

## 2016-10-25 NOTE — Assessment & Plan Note (Signed)
History of noncritical CAD by cardiac catheterization which I performed June 2012 40-60% ostial RCA stenosis without damping and normal LV function. The patient denies chest pain or shortness of breath.

## 2016-11-14 DIAGNOSIS — L309 Dermatitis, unspecified: Secondary | ICD-10-CM | POA: Diagnosis not present

## 2016-11-22 DIAGNOSIS — L0291 Cutaneous abscess, unspecified: Secondary | ICD-10-CM | POA: Diagnosis not present

## 2016-11-22 DIAGNOSIS — L821 Other seborrheic keratosis: Secondary | ICD-10-CM | POA: Diagnosis not present

## 2016-11-24 DIAGNOSIS — N200 Calculus of kidney: Secondary | ICD-10-CM | POA: Diagnosis not present

## 2016-11-24 DIAGNOSIS — C61 Malignant neoplasm of prostate: Secondary | ICD-10-CM | POA: Diagnosis not present

## 2016-11-25 ENCOUNTER — Telehealth: Payer: Self-pay | Admitting: Cardiovascular Disease

## 2016-11-25 NOTE — Telephone Encounter (Signed)
New message         [facility] Pre-operative Risk Assessment    Request for surgical clearance:  1. What type of surgery is being performed? Left extracoporeal shock wave lithotripsy  When is this surgery scheduled? Pending clearance 2. Are there any medications that need to be held prior to surgery and how long? Hold aspirin and need cardiac clearance  3. Name of physician performing surgery?  Dr Junious Silk  4. What is your office phone and fax number?  [place] 11/25/2016, 12:17 PM  _________________________________________________________________   (provider comments below)

## 2016-11-27 NOTE — Telephone Encounter (Signed)
OK to proceed with lithotripsy

## 2016-11-28 NOTE — Telephone Encounter (Signed)
Routed to number provided via EPIC. 

## 2016-12-02 NOTE — Telephone Encounter (Signed)
Received verbal ok from pt to speak with wife. Informed clear for surgery-lithotripsy   Clearance faxed tried to call back number provided wrong # provided.  Left detailed message for connie, re-faxed note, fax already sent 11-27-16

## 2016-12-02 NOTE — Telephone Encounter (Signed)
Follow up    Marlowe Kays is calling to follow up on this. She said she has not heard anything. Please call.

## 2016-12-05 ENCOUNTER — Telehealth: Payer: Self-pay | Admitting: Cardiovascular Disease

## 2016-12-05 ENCOUNTER — Other Ambulatory Visit: Payer: Self-pay | Admitting: Urology

## 2016-12-05 NOTE — Telephone Encounter (Signed)
[facility]) is calling because there was one question not answered on the request for clearance. Please call

## 2016-12-05 NOTE — Telephone Encounter (Signed)
Spoke with connie, they need to know about holding the patients aspirin for 7 days prior to the procedure. If need, the aspirin  can be only held for 72 hours. They need instructions from dr berry. Aware back in the office tomorrow will forward for his review.

## 2016-12-08 ENCOUNTER — Encounter (HOSPITAL_COMMUNITY): Payer: Self-pay | Admitting: *Deleted

## 2016-12-08 NOTE — Telephone Encounter (Signed)
Left a message to call back.

## 2016-12-08 NOTE — Telephone Encounter (Signed)
OK to hold ASA for 7 days prior to procedure

## 2016-12-08 NOTE — Telephone Encounter (Signed)
Richard Reed needs to know today if pt can stop his Aspirin please.His surgery is scheduled for Monday.

## 2016-12-08 NOTE — Telephone Encounter (Signed)
Marlowe Kays from [facility] has been made aware of Dr. Kennon Holter instructions and verbalized her understanding.

## 2016-12-12 ENCOUNTER — Encounter (HOSPITAL_COMMUNITY)
Admission: RE | Disposition: A | Discharge status: Home / Self Care | Payer: Self-pay | Source: Ambulatory Visit | Attending: Urology

## 2016-12-12 ENCOUNTER — Ambulatory Visit (HOSPITAL_COMMUNITY)
Admission: RE | Admit: 2016-12-12 | Discharge: 2016-12-12 | Disposition: A | Discharge status: Home / Self Care | Payer: Medicare Other | Source: Ambulatory Visit | Attending: Urology | Admitting: Urology

## 2016-12-12 ENCOUNTER — Encounter (HOSPITAL_COMMUNITY): Payer: Self-pay | Admitting: *Deleted

## 2016-12-12 ENCOUNTER — Ambulatory Visit (HOSPITAL_COMMUNITY): Payer: Medicare Other

## 2016-12-12 DIAGNOSIS — Z7982 Long term (current) use of aspirin: Secondary | ICD-10-CM | POA: Diagnosis not present

## 2016-12-12 DIAGNOSIS — Z79899 Other long term (current) drug therapy: Secondary | ICD-10-CM | POA: Diagnosis not present

## 2016-12-12 DIAGNOSIS — N201 Calculus of ureter: Secondary | ICD-10-CM | POA: Insufficient documentation

## 2016-12-12 DIAGNOSIS — N200 Calculus of kidney: Secondary | ICD-10-CM | POA: Diagnosis not present

## 2016-12-12 DIAGNOSIS — Z8546 Personal history of malignant neoplasm of prostate: Secondary | ICD-10-CM | POA: Insufficient documentation

## 2016-12-12 HISTORY — PX: EXTRACORPOREAL SHOCK WAVE LITHOTRIPSY: SHX1557

## 2016-12-12 HISTORY — DX: Personal history of urinary calculi: Z87.442

## 2016-12-12 HISTORY — DX: Nonrheumatic aortic (valve) insufficiency: I35.1

## 2016-12-12 SURGERY — LITHOTRIPSY, ESWL
Anesthesia: LOCAL | Laterality: Left

## 2016-12-12 MED ORDER — DIPHENHYDRAMINE HCL 25 MG PO CAPS: 25.0000 mg | ORAL_CAPSULE | ORAL | Status: DC

## 2016-12-12 MED ORDER — CIPROFLOXACIN HCL 500 MG PO TABS
500.0000 mg | ORAL_TABLET | ORAL | Status: AC
  Administered 2016-12-12: 500 mg via ORAL
  Filled 2016-12-12: qty 1

## 2016-12-12 MED ORDER — TAMSULOSIN HCL 0.4 MG PO CAPS: 0.4000 mg | ORAL_CAPSULE | Freq: Every day | ORAL | 11 refills | Status: DC

## 2016-12-12 MED ORDER — DIAZEPAM 5 MG PO TABS: 10.0000 mg | ORAL_TABLET | ORAL | Status: DC

## 2016-12-12 MED ORDER — OXYCODONE HCL 5 MG PO TABS
5.0000 mg | ORAL_TABLET | ORAL | Status: DC | PRN
  Administered 2016-12-12: 5 mg via ORAL
  Filled 2016-12-12: qty 1

## 2016-12-12 MED ORDER — SODIUM CHLORIDE 0.9 % IV SOLN
INTRAVENOUS | Status: DC
  Administered 2016-12-12: 07:00:00 via INTRAVENOUS

## 2016-12-12 NOTE — Discharge Instructions (Signed)
See [facility] discharge instructions in chart.  

## 2016-12-12 NOTE — Op Note (Signed)
See [facility] OP note scanned into chart. Also because of the size, density, location and other factors that cannot be anticipated I feel this will likely be a staged procedure. This fact supersedes any indication in the scanned [facility] operative note to the contrary.  

## 2016-12-12 NOTE — H&P (Signed)
I have kidney stones.  HPI: The problem is on the left side. This is not his first kidney stone. He has had 1 stones prior to getting this one. He is currently having back pain. He denies having flank pain, groin pain, nausea, vomiting, fever, and chills.   Pt had acute right flank pain April 2012. A CT revealed a 4 mm right distal ureteral stone and a left UPJ stone. His CR went from 1.4 to 2.74. In OR cysto revealed normal urethra, moderate BPH with patent bladder neck, normal bladder with trabeculation. Stone had passed into bladder. Right RGP normal and URS normal, but stent placed as precaution. Left RGP normal, stent placed as precaution. Dr Terance Hart removed the stents in May 2012. Repeat CR 1.45 with GFR 45.   -AAS/KUB Apr 13, 2011 - 8 mm left renal stone stable.   KUB today reveals the stone appears slightly larger at 14 mm (on prior CT 11 cm SSD, 526 HU, visible). Pt has had mild intermittent left flank pain for about month.     ALLERGIES: No Allergies    MEDICATIONS: Tamsulosin Hcl 0.4 mg capsule, ext release 24 hr TAKE 1 CAPSULE Daily  Aspir 81  Calcium Citrate 200 mg (950 mg) tablet  Carvedilol 3.125 mg tablet  Cyanocobalamin  Fish Oil  Multivitamin     GU PSH: Cystoscopy Insert Stent - 2012 Cystoscopy Ureteroscopy - 2012      PSH Notes: Cholecystectomy, Cystoscopy With Ureteroscopy Right, Cystoscopy With Insertion Of Ureteral Stent Bilateral   NON-GU PSH: Cholecystectomy (open) - 2013    GU PMH: History of prostate cancer, Personal history of prostate cancer - 08/20/2015 Renal calculus, Nephrolithiasis - 08/20/2015 Prostate Cancer, Prostate cancer - 2016 Urinary Retention, Unspec, Incomplete bladder emptying - 2016 Bladder-neck stenosis/contracture, Bladder neck contracture - 2014 Hydronephrosis Unspec, Hydronephrosis, right - 2014 Mixed incontinence, Urge and stress incontinence - 2014 Oth urogenital candidiasis, Genitourinary infection, candidal - 2014 Renal cyst,  Renal cyst, acquired - 2014 Urinary Tract Inf, Unspec site, Pyuria - 2014, Urinary tract infection, - 2014      PMH Notes:  2007-02-14 09:08:32 - Note: Arthritis   NON-GU PMH: Encounter for general adult medical examination without abnormal findings, Encounter for preventive health examination - 2016 Cardiac murmur, unspecified, Murmurs - 2014 Personal history of other diseases of the digestive system, History of esophageal reflux - 2014    FAMILY HISTORY: Family Health Status Children _4__ Living Daughter - Runs In Family Heart Disease - Mother, Brother nephrolithiasis - Brother Prostate Cancer - Brother   SOCIAL HISTORY: Marital Status: Married Preferred Language: English; Ethnicity: Not Hispanic Or Latino; Race: White Current Smoking Status: Patient has never smoked.   Tobacco Use Assessment Completed: Used Tobacco in last 30 days? Has never drank.  Does not drink caffeine.     Notes: Never A Smoker, Tobacco Use, Occupation:, Marital History - Currently Married, Alcohol Use   REVIEW OF SYSTEMS:    GU Review Male:   Patient reports get up at night to urinate. Patient denies frequent urination, hard to postpone urination, burning/ pain with urination, leakage of urine, stream starts and stops, trouble starting your stream, have to strain to urinate , erection problems, and penile pain.  Gastrointestinal (Upper):   Patient reports indigestion/ heartburn. Patient denies nausea and vomiting.  Gastrointestinal (Lower):   Patient denies diarrhea and constipation.  Constitutional:   Patient denies fever, night sweats, weight loss, and fatigue.  Skin:   Patient reports skin rash/ lesion. Patient denies  itching.  Eyes:   Patient denies blurred vision and double vision.  Ears/ Nose/ Throat:   Patient denies sore throat and sinus problems.  Hematologic/Lymphatic:   Patient denies swollen glands and easy bruising.  Cardiovascular:   Patient denies leg swelling and chest pains.   Respiratory:   Patient denies cough and shortness of breath.  Endocrine:   Patient denies excessive thirst.  Musculoskeletal:   Patient reports joint pain. Patient denies back pain.  Neurological:   Patient denies headaches and dizziness.  Psychologic:   Patient denies depression and anxiety.   VITAL SIGNS:      11/24/2016 01:49 PM  Weight 149 lb / 67.59 kg  Height 68 in / 172.72 cm  BP 119/72 mmHg  Pulse 71 /min  Temperature 98.3 F / 36.8 C  BMI 22.7 kg/m   MULTI-SYSTEM PHYSICAL EXAMINATION:    Constitutional: Well-nourished. No physical deformities. Normally developed. Good grooming.  Neck: Neck symmetrical, not swollen. Normal tracheal position.  Respiratory: No labored breathing, no use of accessory muscles.   Cardiovascular: Normal temperature, normal extremity pulses, no swelling, no varicosities.  Skin: No paleness, no jaundice, no cyanosis. No lesion, no ulcer, no rash.  Neurologic / Psychiatric: Oriented to time, oriented to place, oriented to person. No depression, no anxiety, no agitation.     PAST DATA REVIEWED:  Source Of History:  Patient  X-Ray Review: KUB: Reviewed Films.  C.T. Abdomen/Pelvis: Reviewed Films.     08/21/15 07/09/13 04/28/11 12/16/10 04/21/10 04/20/09 03/17/08 02/06/07  PSA  Total PSA <0.01  0.01  <0.01  0.01  0.00  <0.04  <0.04  0.00     PROCEDURES:         KUB - 74018  A single view of the abdomen is obtained.  Bony Abnormalities:  None  Fecal Stasis:  Normal bowel gas pattern  Soft Tissue:  Unremarkable  Calculi:  14 mm stone which appears to be in the left UPJ.      Comparison to prior CT and KUB.         Urinalysis Dipstick Dipstick Cont'd  Color: Yellow Bilirubin: Neg  Appearance: Clear Ketones: Neg  Specific Gravity: 1.020 Blood: Neg  pH: 6.5 Protein: Neg  Glucose: Neg Urobilinogen: 0.2    Nitrites: Neg    Leukocyte Esterase: Neg    ASSESSMENT:      ICD-10 Details  1 GU:   Prostate Cancer - C61   2   Renal calculus -  N20.0    PLAN:           Orders Labs PSA          Schedule Return Visit/Planned Activity: Next Available Appointment - Schedule Surgery          Document Letter(s):  Created for Patient: Clinical Summary         Notes:   PCa - PSA was sent. Voiding well on tamsulosin.   left renal stone - appears larger. I discussed with the patient the nature risks and benefits of continued stone passage, off label use of alpha blockers, shockwave lithotripsy or ureteroscopy. We discussed relative success rates and need for staged procedures. All questions answered. They want to proceed with ESWL. We'll get clearance from Dr. [name]. We also discussed one of my colleagues may be doing the procedure.

## 2016-12-12 NOTE — Progress Notes (Signed)
Spoke with Glenard Haring RN per [employer] truck after paging Dr Louis Meckel; to hold valium and bendryl.

## 2016-12-28 DIAGNOSIS — N200 Calculus of kidney: Secondary | ICD-10-CM | POA: Diagnosis not present

## 2017-01-03 DIAGNOSIS — N200 Calculus of kidney: Secondary | ICD-10-CM | POA: Diagnosis not present

## 2017-02-20 DIAGNOSIS — N200 Calculus of kidney: Secondary | ICD-10-CM | POA: Diagnosis not present

## 2017-02-21 DIAGNOSIS — H3589 Other specified retinal disorders: Secondary | ICD-10-CM | POA: Diagnosis not present

## 2017-02-21 DIAGNOSIS — H532 Diplopia: Secondary | ICD-10-CM | POA: Diagnosis not present

## 2017-02-21 DIAGNOSIS — H40013 Open angle with borderline findings, low risk, bilateral: Secondary | ICD-10-CM | POA: Diagnosis not present

## 2017-02-21 DIAGNOSIS — H5022 Vertical strabismus, left eye: Secondary | ICD-10-CM | POA: Diagnosis not present

## 2017-02-21 DIAGNOSIS — N200 Calculus of kidney: Secondary | ICD-10-CM | POA: Diagnosis not present

## 2017-03-21 ENCOUNTER — Other Ambulatory Visit: Payer: Self-pay | Admitting: Nurse Practitioner

## 2017-03-21 DIAGNOSIS — N200 Calculus of kidney: Secondary | ICD-10-CM | POA: Diagnosis not present

## 2017-03-28 ENCOUNTER — Telehealth (HOSPITAL_COMMUNITY): Payer: Self-pay

## 2017-03-28 IMAGING — NM NM MISC PROCEDURE
6 series · 36 of 36 positions shown · non-contrast
Comparison: none

[Series 1: wbr rest · 6.40mm/px · 6 of 64 frames shown]
[frame 6/64]
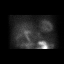
[frame 16/64]
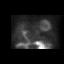
[frame 27/64]
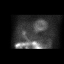
[frame 38/64]
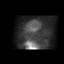
[frame 48/64]
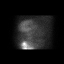
[frame 59/64]
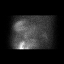

[Series 1: wbr_r-proj_st wbr rest · 6.40mm/px · 6 of 64 frames shown]
[frame 6/64]
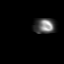
[frame 16/64]
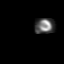
[frame 27/64]
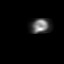
[frame 38/64]
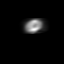
[frame 48/64]
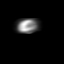
[frame 59/64]
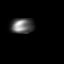

[Series 2: wbr_s-proj_st wbr stress-gsp · 6.40mm/px · 6 of 512 frames shown]
[frame 43/512]
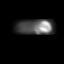
[frame 128/512]
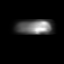
[frame 214/512]
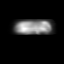
[frame 299/512]
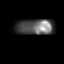
[frame 384/512]
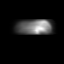
[frame 470/512]
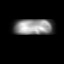

[Series 2: wbr stress-gsp · 6.40mm/px · 6 of 512 frames shown]
[frame 43/512]
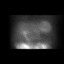
[frame 128/512]
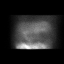
[frame 214/512]
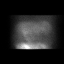
[frame 299/512]
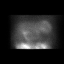
[frame 384/512]
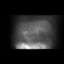
[frame 470/512]
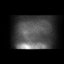

[Series 3: wbr_s-proj_st wbr stress-sum-em · 6.40mm/px · 6 of 64 frames shown]
[frame 6/64]
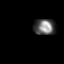
[frame 16/64]
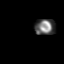
[frame 27/64]
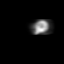
[frame 38/64]
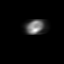
[frame 48/64]
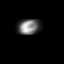
[frame 59/64]
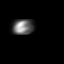

[Series 3: wbr stress-sum-em · 6.40mm/px · 6 of 64 frames shown]
[frame 6/64]
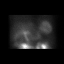
[frame 16/64]
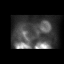
[frame 27/64]
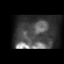
[frame 38/64]
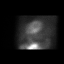
[frame 48/64]
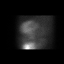
[frame 59/64]
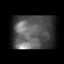

[36 of 36 positions shown; findings below may reference images not displayed]

Canned report from images found in remote index.

Refer to host system for actual result text.

## 2017-03-28 NOTE — Telephone Encounter (Signed)
Called to schedule 6 month f/u cta head and neck, left message for pt to return call. AW

## 2017-03-29 ENCOUNTER — Telehealth: Payer: Self-pay | Admitting: Cardiology

## 2017-03-29 ENCOUNTER — Other Ambulatory Visit (HOSPITAL_COMMUNITY): Payer: Self-pay | Admitting: Interventional Radiology

## 2017-03-29 DIAGNOSIS — I729 Aneurysm of unspecified site: Secondary | ICD-10-CM

## 2017-03-29 NOTE — Telephone Encounter (Signed)
I spoke with Dr. Colin Ina who was seeing Richard Reed in a home visit. Noted pulse 30-40 with ? Bigeminy. Plan to hold low dose Coreg and come in tomorrow for Ecg. With nurse.   Richard Reed Martinique MD, [facility]

## 2017-03-30 ENCOUNTER — Other Ambulatory Visit: Payer: Self-pay

## 2017-03-30 ENCOUNTER — Ambulatory Visit (INDEPENDENT_AMBULATORY_CARE_PROVIDER_SITE_OTHER): Payer: Medicare Other | Admitting: *Deleted

## 2017-03-30 VITALS — BP 112/78 | HR 81

## 2017-03-30 DIAGNOSIS — R9431 Abnormal electrocardiogram [ECG] [EKG]: Secondary | ICD-10-CM | POA: Diagnosis not present

## 2017-03-30 NOTE — Patient Instructions (Signed)
Medication Instructions:   HOLD carvedilol 3.125mg  BID Continue other medications as prescribed.  Labwork:   none  Testing/Procedures:  none  Follow-Up:  With Rosaria Ferries 05/01/17 (call sooner if new concerns)  If you need a refill on your cardiac medications before your next appointment, please call your pharmacy.

## 2017-03-30 NOTE — Progress Notes (Signed)
Patient came in for EKG visit (see telephone note encounter created 12/12 by Dr. Martinique). Dr. Debara Pickett DoD today reviewed findings - EKG showed HR 81, NSR w PVCs. Dr. Debara Pickett relayed instruction to have patient remain off of carvedilol & keep f/u appt in January as scheduled (05/01/17 w Rosaria Ferries).  I also advised them to call if concerns or needs in interim. Patient and wife verbalized understanding of these instructions.

## 2017-04-04 ENCOUNTER — Other Ambulatory Visit (HOSPITAL_COMMUNITY): Payer: Self-pay | Admitting: Interventional Radiology

## 2017-04-04 ENCOUNTER — Ambulatory Visit (HOSPITAL_COMMUNITY): Payer: Medicare Other

## 2017-04-04 ENCOUNTER — Ambulatory Visit (HOSPITAL_COMMUNITY)
Admission: RE | Admit: 2017-04-04 | Discharge: 2017-04-04 | Disposition: A | Discharge status: Home / Self Care | Payer: Medicare Other | Source: Ambulatory Visit | Attending: Interventional Radiology | Admitting: Interventional Radiology

## 2017-04-04 DIAGNOSIS — I6523 Occlusion and stenosis of bilateral carotid arteries: Secondary | ICD-10-CM | POA: Diagnosis not present

## 2017-04-04 DIAGNOSIS — I771 Stricture of artery: Secondary | ICD-10-CM | POA: Insufficient documentation

## 2017-04-04 DIAGNOSIS — I700 Atherosclerosis of aorta: Secondary | ICD-10-CM | POA: Diagnosis not present

## 2017-04-04 DIAGNOSIS — I671 Cerebral aneurysm, nonruptured: Secondary | ICD-10-CM | POA: Diagnosis not present

## 2017-04-04 DIAGNOSIS — I6503 Occlusion and stenosis of bilateral vertebral arteries: Secondary | ICD-10-CM | POA: Insufficient documentation

## 2017-04-04 DIAGNOSIS — I729 Aneurysm of unspecified site: Secondary | ICD-10-CM | POA: Diagnosis present

## 2017-04-04 DIAGNOSIS — I712 Thoracic aortic aneurysm, without rupture: Secondary | ICD-10-CM | POA: Insufficient documentation

## 2017-04-04 LAB — POCT I-STAT CREATININE: Creatinine, Ser: 1.6 mg/dL — ABNORMAL HIGH (ref 0.61–1.24)

## 2017-04-04 MED ORDER — IOPAMIDOL (ISOVUE-370) INJECTION 76%
INTRAVENOUS | Status: AC
  Administered 2017-04-04: 50 mL
  Filled 2017-04-04: qty 50

## 2017-04-04 MED ORDER — IOPAMIDOL (ISOVUE-370) INJECTION 76%
INTRAVENOUS | Status: AC
  Filled 2017-04-04: qty 50

## 2017-04-13 DIAGNOSIS — I129 Hypertensive chronic kidney disease with stage 1 through stage 4 chronic kidney disease, or unspecified chronic kidney disease: Secondary | ICD-10-CM | POA: Diagnosis not present

## 2017-04-13 DIAGNOSIS — N183 Chronic kidney disease, stage 3 (moderate): Secondary | ICD-10-CM | POA: Diagnosis not present

## 2017-04-13 DIAGNOSIS — N200 Calculus of kidney: Secondary | ICD-10-CM | POA: Diagnosis not present

## 2017-05-01 ENCOUNTER — Ambulatory Visit: Payer: Medicare Other | Admitting: Physician Assistant

## 2017-05-02 ENCOUNTER — Ambulatory Visit: Payer: Medicare Other | Admitting: Neurology

## 2017-05-05 ENCOUNTER — Telehealth (HOSPITAL_COMMUNITY): Payer: Self-pay

## 2017-05-05 NOTE — Telephone Encounter (Signed)
Pt's wife agreed to have pt f/u in 1 yr with cta head/neck. AW

## 2017-05-22 ENCOUNTER — Ambulatory Visit: Payer: Medicare Other | Admitting: Physician Assistant

## 2017-05-22 ENCOUNTER — Encounter: Payer: Self-pay | Admitting: Physician Assistant

## 2017-05-22 VITALS — BP 130/68 | HR 90 | Ht 68.0 in | Wt 149.0 lb

## 2017-05-22 DIAGNOSIS — I2583 Coronary atherosclerosis due to lipid rich plaque: Secondary | ICD-10-CM

## 2017-05-22 DIAGNOSIS — I351 Nonrheumatic aortic (valve) insufficiency: Secondary | ICD-10-CM

## 2017-05-22 DIAGNOSIS — I480 Paroxysmal atrial fibrillation: Secondary | ICD-10-CM

## 2017-05-22 DIAGNOSIS — I10 Essential (primary) hypertension: Secondary | ICD-10-CM

## 2017-05-22 DIAGNOSIS — I493 Ventricular premature depolarization: Secondary | ICD-10-CM | POA: Diagnosis not present

## 2017-05-22 DIAGNOSIS — E785 Hyperlipidemia, unspecified: Secondary | ICD-10-CM | POA: Diagnosis not present

## 2017-05-22 DIAGNOSIS — I2510 Atherosclerotic heart disease of native coronary artery without angina pectoris: Secondary | ICD-10-CM

## 2017-05-22 LAB — LIPID PANEL
CHOL/HDL RATIO: 2.5 ratio (ref 0.0–5.0)
Cholesterol, Total: 97 mg/dL — ABNORMAL LOW (ref 100–199)
HDL: 39 mg/dL — AB (ref >39)
LDL Calculated: 45 mg/dL (ref 0–99)
Triglycerides: 66 mg/dL (ref 0–149)
VLDL CHOLESTEROL CAL: 13 mg/dL (ref 5–40)

## 2017-05-22 LAB — HEPATIC FUNCTION PANEL
ALK PHOS: 77 IU/L (ref 39–117)
ALT: 13 IU/L (ref 0–44)
AST: 17 IU/L (ref 0–40)
Albumin: 3.8 g/dL (ref 3.5–4.7)
Bilirubin Total: 0.6 mg/dL (ref 0.0–1.2)
Bilirubin, Direct: 0.2 mg/dL (ref 0.00–0.40)
TOTAL PROTEIN: 6.1 g/dL (ref 6.0–8.5)

## 2017-05-22 NOTE — Patient Instructions (Signed)
Medication Instructions:   Your physician recommends that you continue on your current medications as directed. Please refer to the Current Medication list given to you today.   If you need a refill on your cardiac medications before your next appointment, please call your pharmacy.  Labwork:  LFT AND HEPATIC LABS TODAY     Testing/Procedures: NONE ORDERED  TODAY    Follow-Up:  Your physician wants you to follow-up in:  IN 6   MONTHS WITH DR  [name]  You will receive a reminder letter in the mail two months in advance. If you don't receive a letter, please call our office to schedule the follow-up appointment.      Any Other Special Instructions Will Be Listed Below (If Applicable).

## 2017-05-22 NOTE — Progress Notes (Signed)
Cardiology Office Note   Date:  05/22/2017   ID:  Richard Reed, DOB [birth year], MRN [id number]  PCP:  Kathyrn Lass, MD  Cardiologist:  Dr [name], 10/26/2016  Daune Perch, NP   Chief Complaint  Patient presents with  . Follow-up    History of Present Illness: Richard Reed is a 84 y.o. male with a history of PAF, mod AI, EF 45-50% HTN, HLD, cath 2012 w/ RCA 40-60%, no recurrence of afib>>no anticoag, CHA2DS2VASc=4 (age x 2, CAD, HTN)  Richard Reed presents for follow up of CAD and hypertension. He is feeling his usual health. No recent changes in health. Daily wts at home stable. No chest pain or shortness of breath. Feels an occ heart thump but no significant palpitations. Doesn't walk much due to hip pain. Can only walk to the end of the street. Occ lightheadedness with walking, not all the time, no falls or syncope.   His wife and daughter are here with him. They report that he has had a cold and they've been giving him multisymptom cold medication as well as topical Vicks.    Past Medical History:  Diagnosis Date  . Angina   . Aortic valve insufficiency, moderate    a. 02/2016 Echo: mild AS, mild to mod AI.  Marland Kitchen Arthritis   . Arthritis pain   . CAD (coronary artery disease)    a. 09/2010 Cath: RCA 40-60 ost, otw nl cors;  b. 03/2016 Lexiscan MV: EF 46%, no ischemia, low risk.  . Cancer ([place])   . Carotid disease, bilateral ([place])    a. 02/2016 Carotid U/S: 1-39% bilat ICA plaquing.  . Cerebral aneurysm    a. 02/2016 MRA: fusiform bilobed aneurysm @ R MCA bifurcation.  . Cerebrovascular disease    a. 02/2016 MRA: long segment narrowing of basilar artery, moderate bilat posterior inferior cerebellar artery dzs.  . Chronic combined systolic and diastolic CHF (congestive heart failure) ([place])    a. 02/2016 Admitted to hospital in [place] w/ CHF;  b. 02/2016 Echo ([facility]): EF 45-50%, diff HK, gr1DD, mild AS, mild to mod AI, mildly dil Ao root, PASP 19mmHg.  . CKD (chronic  kidney disease), stage III ([place])   . Dysrhythmia   . Essential hypertension   . Hard of hearing   . Hemiparesis and alteration of sensations as late effects of stroke ([place]) 04/29/2015  . Hiatal hernia   . History of kidney stones   . HOH (hard of hearing)   . Hyperlipidemia   . Kidney stones   . Moderate aortic insufficiency   . NICM (nonischemic cardiomyopathy) ([place])    a. 02/2016 Echo: EF 45-50%, diff HK;  b. 03/2016 Lexiscan MV: No ischemia, EF 46%.  Marland Kitchen NSVT (nonsustained ventricular tachycardia) ([place])    a. 03/2016 noted on event monitor.  Marland Kitchen PAF (paroxysmal atrial fibrillation) ([place])    a. 2012 - noted in setting of E coli bacteremia and septic shock.  . Pneumonia 03/23/2011   "first time"  . Prostate cancer ([place])   . Sepsis due to Escherichia coli ([place]) 03/24/11  . Syncope    a. 02/2016 presumed to be 2/2 hypotension/dehydration and basilar artery dzs noted on MRA.  Marland Kitchen TIA (transient ischemic attack)   . Vertebrobasilar insufficiency 04/28/2016  . Visual disturbance 03/19/2014    Past Surgical History:  Procedure Laterality Date  . CARDIAC CATHETERIZATION  09/2010   non obstructive CAD, 40-60% ostial RCA stenosis without dampening   . CATARACT EXTRACTION  Bilateral   . cerebral angiography    . CHOLECYSTECTOMY  04/15/2011   Procedure: LAPAROSCOPIC CHOLECYSTECTOMY WITH INTRAOPERATIVE CHOLANGIOGRAM;  Surgeon: Belva Crome, MD;  Location: [facility];  Service: General;  Laterality: N/A;  . EXTRACORPOREAL SHOCK WAVE LITHOTRIPSY Left 12/12/2016   Procedure: LEFT EXTRACORPOREAL SHOCK WAVE LITHOTRIPSY (ESWL);  Surgeon: Ardis Hughs, MD;  Location: [facility];  Service: Urology;  Laterality: Left;  . IR GENERIC HISTORICAL  03/17/2016   IR ANGIO VERTEBRAL SEL SUBCLAVIAN INNOMINATE BILAT MOD SED 03/17/2016 [name], MD [facility]  . IR GENERIC HISTORICAL  03/17/2016   IR ANGIO INTRA EXTRACRAN SEL COM CAROTID INNOMINATE BILAT MOD SED 03/17/2016 [name], MD [facility]  . PROSTATE SURGERY     approx 10 years ago, seed implant radiation  . TONSILLECTOMY      Current Outpatient Medications  Medication Sig Dispense Refill  . aspirin EC 81 MG tablet Take 81 mg by mouth daily.    . Calcium Citrate 200 MG TABS Take 4 tablets by mouth daily.    . cyanocobalamin 500 MCG tablet Take 500 mcg by mouth daily.    . feeding supplement, ENSURE ENLIVE, (ENSURE ENLIVE) LIQD Take 237 mLs by mouth 2 (two) times daily between meals. 237 mL 12  . fish oil-omega-3 fatty acids 1000 MG capsule Take 1 g by mouth daily.     . Multiple Vitamin (MULTIVITAMIN) tablet Take 1 tablet by mouth daily.    . rosuvastatin (CRESTOR) 10 MG tablet TAKE ONE (1) TABLET BY MOUTH EVERY DAY 90 tablet 2  . tamsulosin (FLOMAX) 0.4 MG CAPS capsule Take 1 capsule (0.4 mg total) by mouth daily. 30 capsule 11   No current facility-administered medications for this visit.     Allergies:   Patient has no known allergies.    Social History:  The patient  reports that  has never smoked. he has never used smokeless tobacco. He reports that he does not drink alcohol or use drugs.   Family History:  The patient's family history includes Cancer in his brother; Heart disease in his father and mother; Heart failure in his mother.    ROS:  Please see the history of present illness. All other systems are reviewed and negative.    PHYSICAL EXAM: VS:  BP 130/68   Pulse 90   Ht 5\' 8"  (1.727 m)   Wt 149 lb (67.6 kg)   SpO2 99%   BMI 22.66 kg/m  , BMI Body mass index is 22.66 kg/m. GEN: Well nourished, well developed,elderly male in no acute distress  HEENT: normal for age  Neck: no JVD, no carotid bruit, no masses Cardiac: Regularly irregular rhythm; 2/6 systolic murmur at RUSB Respiratory:  clear to auscultation bilaterally, normal work of breathing GI: soft, nontender, nondistended, + BS MS: no deformity or atrophy; trace pedal edema in RLE, none in left; distal pulses are 2+ in all 4  extremities   Skin: warm and dry, no rash Neuro:  Strength and sensation are intact Psych: euthymic mood, full affect   EKG:  EKG not ordered today.  Recent Labs: 04/04/2017: Creatinine, Ser 1.60    Lipid Panel    Component Value Date/Time   CHOL 99 04/25/2016 0813   TRIG 67 04/25/2016 0813   HDL 39 (L) 04/25/2016 0813   CHOLHDL 2.5 04/25/2016 0813   VLDL 13 04/25/2016 0813   LDLCALC 47 04/25/2016 0813     Wt Readings from Last 3 Encounters:  05/22/17 149  lb (67.6 kg)  12/12/16 146 lb 6 oz (66.4 kg)  10/25/16 150 lb (68 kg)     Other studies Reviewed: Additional studies/ records that were reviewed today include:   Nuclear stress test 12/13/52017 Study Highlights    The left ventricular ejection fraction is mildly decreased (45-54%).  Nuclear stress EF: 46%.  There was no ST segment deviation noted during stress.  This is a low risk study.   Normal perfusion EF calculated at 46% but no discrete RWMA;s May be due to frequent ventricular ectopy     Echo 11/14/2017Study Conclusions - Left ventricle: The cavity size was normal. Wall thickness was   normal. Systolic function was mildly reduced. The estimated   ejection fraction was in the range of 45% to 50%. Diffuse   hypokinesis. Doppler parameters are consistent with abnormal left   ventricular relaxation (grade 1 diastolic dysfunction). Doppler   parameters are consistent with high ventricular filling pressure. - Aortic valve: Valve mobility was restricted. There was mild   stenosis. There was mild to moderate regurgitation. Valve area   (VTI): 1.54 cm^2. Valve area (Vmax): 1.42 cm^2. Valve area   (Vmean): 1.53 cm^2. - Aortic root: The aortic root was mildly dilated. - Mitral valve: Calcified annulus. - Pulmonary arteries: Systolic pressure was mildly increased. PA   peak pressure: 38 mm Hg (S).  Impressions: - Mild global reduction in LV function (EF 50); grade 1 diastolic   dysfunction with  elevated LV filling pressure; calcified aortic   valve with mild AS and mild to moderate AI; mild TR with mildly   elevated pulmonary pressure.  ASSESSMENT AND PLAN:  CAD: On aspirin and statin. BB discontinued due to low HR. No anginal symptoms.   PVCs:  Carvedilol stopped due to recent HR in 30's-40's, FU EKG showed SR with PVCs and PACs. Today irregualr heart beat with every third beat dropped. Pt has had PVCs for several years. He is doing well, asymptomatic. Has been taking multisymptom cold medication recently. Pt and his wife and daughter advised not to give him decongestants du to causing worsened PVCs.   PAF: Only had one known episode with sepsis. Not anticoagulated. No palpitations or suggestions of afib.   Hypertension: BP well controlled.   Hyperlipidemia: On Crestor 10 mg. Last LDL was 47 in 04/2016. Will update lipid panel and LFTs  Aortic insufficiency/stenosis: mild AS and mild to moderate AI by echo 02/2016. Asymptomatic with no S/S of heart failure.    Current medicines are reviewed at length with the patient today.  The patient does not have concerns regarding medicines.  The following changes have been made:  no change  Labs/ tests ordered today include: Will update lipid panel and LFTs  Orders Placed This Encounter  Procedures  . Lipid Profile  . Hepatic function panel     Disposition:   FU with Dr [name]  Signed, Daune Perch, AGNP-C [place] 05/22/2017  10:50 AM [place] Phone: [phone]; Fax: [phone]  This note was written with the assistance of speech recognition software. Please excuse any transcriptional errors.

## 2017-05-30 DIAGNOSIS — I2510 Atherosclerotic heart disease of native coronary artery without angina pectoris: Secondary | ICD-10-CM | POA: Diagnosis not present

## 2017-05-30 DIAGNOSIS — Z0000 Encounter for general adult medical examination without abnormal findings: Secondary | ICD-10-CM | POA: Diagnosis not present

## 2017-05-30 DIAGNOSIS — I480 Paroxysmal atrial fibrillation: Secondary | ICD-10-CM | POA: Diagnosis not present

## 2017-05-30 DIAGNOSIS — Z23 Encounter for immunization: Secondary | ICD-10-CM | POA: Diagnosis not present

## 2017-06-14 ENCOUNTER — Telehealth: Payer: Self-pay | Admitting: Neurology

## 2017-06-14 NOTE — Telephone Encounter (Signed)
Called wife back. She states pt has had f/u with PCP and Dr. Estanislado Pandy. Also had an MD visit at home via insurance. They all state pt doing well. She is wondering if she needs to keep upcoming appt in March with Dr. Jannifer Franklin or not. Advised I will send message to him and we will call back.

## 2017-06-14 NOTE — Telephone Encounter (Signed)
Pt wife has called and is asking for a call back from [place] concerning March appointment

## 2017-06-14 NOTE — Telephone Encounter (Signed)
Tried calling wife back, line was busy. If she calls:  If they would like to come in sooner, can offer tomorrow at 2:30pm with Dr. Jannifer Franklin, check in 2:00pm

## 2017-06-14 NOTE — Telephone Encounter (Signed)
I called the wife.  The patient has been seen regularly by primary care physician every 3 months.  He has had a recent evaluation by Dr. Estanislado Pandy with a CT brain and CT angiogram.  At this point, I will have very little to offer him.  The patient will follow-up through this office if needed.  I will cancel the appointment.

## 2017-06-23 DIAGNOSIS — C61 Malignant neoplasm of prostate: Secondary | ICD-10-CM | POA: Diagnosis not present

## 2017-06-23 DIAGNOSIS — N200 Calculus of kidney: Secondary | ICD-10-CM | POA: Diagnosis not present

## 2017-06-28 DIAGNOSIS — N323 Diverticulum of bladder: Secondary | ICD-10-CM | POA: Diagnosis not present

## 2017-06-28 DIAGNOSIS — K5730 Diverticulosis of large intestine without perforation or abscess without bleeding: Secondary | ICD-10-CM | POA: Diagnosis not present

## 2017-06-28 DIAGNOSIS — N200 Calculus of kidney: Secondary | ICD-10-CM | POA: Diagnosis not present

## 2017-06-29 DIAGNOSIS — H903 Sensorineural hearing loss, bilateral: Secondary | ICD-10-CM | POA: Diagnosis not present

## 2017-06-29 DIAGNOSIS — Z570 Occupational exposure to noise: Secondary | ICD-10-CM | POA: Diagnosis not present

## 2017-06-29 DIAGNOSIS — H9313 Tinnitus, bilateral: Secondary | ICD-10-CM | POA: Diagnosis not present

## 2017-06-30 ENCOUNTER — Ambulatory Visit: Payer: Medicare Other | Admitting: Neurology

## 2017-07-24 ENCOUNTER — Other Ambulatory Visit: Payer: Self-pay | Admitting: Nurse Practitioner

## 2017-10-26 DIAGNOSIS — D485 Neoplasm of uncertain behavior of skin: Secondary | ICD-10-CM | POA: Diagnosis not present

## 2017-11-13 DIAGNOSIS — L821 Other seborrheic keratosis: Secondary | ICD-10-CM | POA: Diagnosis not present

## 2017-11-13 DIAGNOSIS — D045 Carcinoma in situ of skin of trunk: Secondary | ICD-10-CM | POA: Diagnosis not present

## 2017-11-13 DIAGNOSIS — C44319 Basal cell carcinoma of skin of other parts of face: Secondary | ICD-10-CM | POA: Diagnosis not present

## 2017-11-13 DIAGNOSIS — L570 Actinic keratosis: Secondary | ICD-10-CM | POA: Diagnosis not present

## 2017-11-13 DIAGNOSIS — C44529 Squamous cell carcinoma of skin of other part of trunk: Secondary | ICD-10-CM | POA: Diagnosis not present

## 2017-12-25 DIAGNOSIS — D225 Melanocytic nevi of trunk: Secondary | ICD-10-CM | POA: Diagnosis not present

## 2017-12-25 DIAGNOSIS — D1801 Hemangioma of skin and subcutaneous tissue: Secondary | ICD-10-CM | POA: Diagnosis not present

## 2017-12-25 DIAGNOSIS — L814 Other melanin hyperpigmentation: Secondary | ICD-10-CM | POA: Diagnosis not present

## 2017-12-25 DIAGNOSIS — L821 Other seborrheic keratosis: Secondary | ICD-10-CM | POA: Diagnosis not present

## 2017-12-27 DIAGNOSIS — R911 Solitary pulmonary nodule: Secondary | ICD-10-CM | POA: Diagnosis not present

## 2017-12-27 DIAGNOSIS — R3912 Poor urinary stream: Secondary | ICD-10-CM | POA: Diagnosis not present

## 2017-12-27 DIAGNOSIS — N200 Calculus of kidney: Secondary | ICD-10-CM | POA: Diagnosis not present

## 2018-01-01 DIAGNOSIS — R911 Solitary pulmonary nodule: Secondary | ICD-10-CM | POA: Diagnosis not present

## 2018-01-12 ENCOUNTER — Encounter: Payer: Self-pay | Admitting: Urology

## 2018-02-21 ENCOUNTER — Encounter: Payer: Self-pay | Admitting: Surgery

## 2018-02-21 ENCOUNTER — Institutional Professional Consult (permissible substitution): Payer: Medicare Other | Admitting: Surgery

## 2018-02-21 ENCOUNTER — Other Ambulatory Visit: Payer: Self-pay

## 2018-02-21 VITALS — BP 134/84 | HR 77 | Resp 18 | Ht 68.0 in | Wt 140.2 lb

## 2018-02-21 DIAGNOSIS — I712 Thoracic aortic aneurysm, without rupture, unspecified: Secondary | ICD-10-CM

## 2018-02-21 NOTE — Progress Notes (Signed)
Cardiothoracic Surgery Consutation  PCP is Via, Lennette Bihari, MD Referring Provider is [name], MD  Chief Complaint  Patient presents with  . Thoracic Aortic Aneurysm    new patient consultation, Chest CT 01/01/18    HPI:  The patient is a 84 year old gentleman with history of hypertension, hyperlipidemia, previous stroke, chronic combined systolic and diastolic congestive heart failure, and moderate aortic insufficiency who had a CTA of the abdomen and pelvis in March 2019 by [name].  This apparently showed a small posterior right lower lobe lung nodule.  The patient had a follow-up CT scan of the chest on 01/01/2018 to follow-up on this lung nodule.  This showed that the posterior right lower lobe lung nodule was unchanged at 9 mm.  There were scattered bilateral noncalcified pulmonary nodules measuring up to 5 mm.  There is also a 4.5 cm fusiform ascending aortic aneurysm.  There is no family history of aortic aneurysm, aortic dissection, or connective tissue disorder.   Past Medical History:  Diagnosis Date  . Angina   . Aortic valve insufficiency, moderate    a. 02/2016 Echo: mild AS, mild to mod AI.  Marland Kitchen Arthritis   . Arthritis pain   . CAD (coronary artery disease)    a. 09/2010 Cath: RCA 40-60 ost, otw nl cors;  b. 03/2016 Lexiscan MV: EF 46%, no ischemia, low risk.  . Cancer ([place])   . Carotid disease, bilateral ([place])    a. 02/2016 Carotid U/S: 1-39% bilat ICA plaquing.  . Cerebral aneurysm    a. 02/2016 MRA: fusiform bilobed aneurysm @ R MCA bifurcation.  . Cerebrovascular disease    a. 02/2016 MRA: long segment narrowing of basilar artery, moderate bilat posterior inferior cerebellar artery dzs.  . Chronic combined systolic and diastolic CHF (congestive heart failure) ([place])    a. 02/2016 Admitted to hospital in [place] w/ CHF;  b. 02/2016 Echo ([facility]): EF 45-50%, diff HK, gr1DD, mild AS, mild to mod AI, mildly dil Ao root, PASP 18mmHg.  . CKD (chronic kidney  disease), stage III ([place])   . Dysrhythmia   . Essential hypertension   . Hard of hearing   . Hemiparesis and alteration of sensations as late effects of stroke ([place]) 04/29/2015  . Hiatal hernia   . History of kidney stones   . HOH (hard of hearing)   . Hyperlipidemia   . Kidney stones   . Moderate aortic insufficiency   . NICM (nonischemic cardiomyopathy) ([place])    a. 02/2016 Echo: EF 45-50%, diff HK;  b. 03/2016 Lexiscan MV: No ischemia, EF 46%.  Marland Kitchen NSVT (nonsustained ventricular tachycardia) ([place])    a. 03/2016 noted on event monitor.  Marland Kitchen PAF (paroxysmal atrial fibrillation) ([place])    a. 2012 - noted in setting of E coli bacteremia and septic shock.  . Pneumonia 03/23/2011   "first time"  . Prostate cancer ([place])   . Sepsis due to Escherichia coli ([place]) 03/24/11  . Syncope    a. 02/2016 presumed to be 2/2 hypotension/dehydration and basilar artery dzs noted on MRA.  Marland Kitchen TIA (transient ischemic attack)   . Vertebrobasilar insufficiency 04/28/2016  . Visual disturbance 03/19/2014    Past Surgical History:  Procedure Laterality Date  . CARDIAC CATHETERIZATION  09/2010   non obstructive CAD, 40-60% ostial RCA stenosis without dampening   . CATARACT EXTRACTION Bilateral   . cerebral angiography    . CHOLECYSTECTOMY  04/15/2011   Procedure: LAPAROSCOPIC CHOLECYSTECTOMY WITH INTRAOPERATIVE CHOLANGIOGRAM;  Surgeon: Gwenyth Ober  III, MD;  Location: [facility];  Service: General;  Laterality: N/A;  . EXTRACORPOREAL SHOCK WAVE LITHOTRIPSY Left 12/12/2016   Procedure: LEFT EXTRACORPOREAL SHOCK WAVE LITHOTRIPSY (ESWL);  Surgeon: Ardis Hughs, MD;  Location: [facility];  Service: Urology;  Laterality: Left;  . IR GENERIC HISTORICAL  03/17/2016   IR ANGIO VERTEBRAL SEL SUBCLAVIAN INNOMINATE BILAT MOD SED 03/17/2016 [name], MD [facility]  . IR GENERIC HISTORICAL  03/17/2016   IR ANGIO INTRA EXTRACRAN SEL COM CAROTID INNOMINATE BILAT MOD SED 03/17/2016 [name], MD [facility]  .  PROSTATE SURGERY     approx 10 years ago, seed implant radiation  . TONSILLECTOMY      Family History  Problem Relation Age of Onset  . Heart failure Mother   . Heart disease Mother   . Heart disease Father   . Cancer Brother        prostate    Social History Social History   Tobacco Use  . Smoking status: Never Smoker  . Smokeless tobacco: Never Used  Substance Use Topics  . Alcohol use: No  . Drug use: No    Current Outpatient Medications  Medication Sig Dispense Refill  . aspirin EC 81 MG tablet Take 81 mg by mouth daily.    . Calcium Citrate 200 MG TABS Take 4 tablets by mouth daily.    . cyanocobalamin 500 MCG tablet Take 500 mcg by mouth daily.    . feeding supplement, ENSURE ENLIVE, (ENSURE ENLIVE) LIQD Take 237 mLs by mouth 2 (two) times daily between meals. 237 mL 12  . fish oil-omega-3 fatty acids 1000 MG capsule Take 1 g by mouth daily.     . Glucosamine-Chondroitin (OSTEO BI-FLEX REGULAR STRENGTH PO) Take 1 tablet by mouth daily.    . Multiple Vitamin (MULTIVITAMIN) tablet Take 1 tablet by mouth daily.    . Multiple Vitamins-Minerals (PRESERVISION AREDS PO) Take 1 tablet by mouth daily.    . rosuvastatin (CRESTOR) 10 MG tablet TAKE ONE (1) TABLET BY MOUTH EVERY DAY 90 tablet 2  . tamsulosin (FLOMAX) 0.4 MG CAPS capsule Take 1 capsule (0.4 mg total) by mouth daily. 30 capsule 11  . TURMERIC PO Take 1 capsule by mouth daily.     No current facility-administered medications for this visit.     No Known Allergies  Review of Systems  Constitutional: Positive for appetite change and unexpected weight change.  HENT: Positive for hearing loss.        Dentures  Eyes:       Blurry vision  Respiratory: Negative.   Cardiovascular: Negative.  Negative for chest pain.  Gastrointestinal: Negative.   Endocrine: Negative.   Genitourinary: Negative.   Musculoskeletal: Positive for arthralgias.  Skin: Negative.   Allergic/Immunologic: Negative.   Neurological:  Negative.   Hematological: Bruises/bleeds easily.  Psychiatric/Behavioral: Negative.     BP 134/84 (BP Location: Left Arm, Patient Position: Sitting, Cuff Size: Normal)   Pulse 77   Resp 18   Ht 5\' 8"  (1.727 m)   Wt 140 lb 3.2 oz (63.6 kg)   SpO2 97% Comment: RA  BMI 21.32 kg/m  Physical Exam  Constitutional: He is oriented to person, place, and time. He appears well-developed and well-nourished. No distress.  Looks younger than his stated age  HENT:  Head: Normocephalic and atraumatic.  Mouth/Throat: Oropharynx is clear and moist.  Eyes: Pupils are equal, round, and reactive to light. Conjunctivae and EOM are normal.  Neck: Normal range of motion.  Neck supple. No JVD present. No thyromegaly present.  Cardiovascular: Normal rate, regular rhythm and intact distal pulses.  Murmur heard. 2/6 diastolic murmur at the apex  Pulmonary/Chest: Effort normal and breath sounds normal. No respiratory distress.  Abdominal: Soft. He exhibits no distension. There is no tenderness.  Musculoskeletal: Normal range of motion. He exhibits no edema.  Lymphadenopathy:    He has no cervical adenopathy.  Neurological: He is alert and oriented to person, place, and time.  Skin: Skin is warm and dry.  Psychiatric: He has a normal mood and affect.     Diagnostic Tests:  CT scan of the chest report reviewed dated 12/22/2017 performed at [facility].  Impression:  This 84 year old gentleman who has a 4.5 cm fusiform ascending aortic aneurysm which is well below the surgical threshold of 5.5 cm.  At 84 years old he would not be a candidate for surgical treatment anyway but I doubt that this will enlarge significantly over his lifetime.  I stressed the importance of good blood pressure control and preventing further enlargement and aortic dissection.  Also advised him against taking quinolone antibiotics.  The CT scan of the chest also shows a 9 mm nodule in the posterior right costophrenic sulcus  which was stable from his prior CT in March 2019.  There are also scattered bilateral pulmonary nodules measuring up to 5 mm.  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  I discussed all this with him and his family and answered their questions.   Plan:  I will see him back in 1 year with a CT scan of the chest.  I spent 60 minutes performing this consultation and > 50% of this time was spent face to face counseling and coordinating the care of this patient's aortic aneurysm and multiple bilateral pulmonary nodules.   Gaye Pollack, MD [facility] [phone]

## 2018-02-27 DIAGNOSIS — H532 Diplopia: Secondary | ICD-10-CM | POA: Diagnosis not present

## 2018-02-27 DIAGNOSIS — Z961 Presence of intraocular lens: Secondary | ICD-10-CM | POA: Diagnosis not present

## 2018-02-27 DIAGNOSIS — H5500 Unspecified nystagmus: Secondary | ICD-10-CM | POA: Diagnosis not present

## 2018-02-27 DIAGNOSIS — D3131 Benign neoplasm of right choroid: Secondary | ICD-10-CM | POA: Diagnosis not present

## 2018-03-02 DIAGNOSIS — Z23 Encounter for immunization: Secondary | ICD-10-CM | POA: Diagnosis not present

## 2018-03-26 DIAGNOSIS — D1801 Hemangioma of skin and subcutaneous tissue: Secondary | ICD-10-CM | POA: Diagnosis not present

## 2018-03-26 DIAGNOSIS — L821 Other seborrheic keratosis: Secondary | ICD-10-CM | POA: Diagnosis not present

## 2018-03-26 DIAGNOSIS — D225 Melanocytic nevi of trunk: Secondary | ICD-10-CM | POA: Diagnosis not present

## 2018-03-26 DIAGNOSIS — L814 Other melanin hyperpigmentation: Secondary | ICD-10-CM | POA: Diagnosis not present

## 2018-04-23 ENCOUNTER — Other Ambulatory Visit: Payer: Self-pay | Admitting: Physician Assistant

## 2018-04-27 DIAGNOSIS — R208 Other disturbances of skin sensation: Secondary | ICD-10-CM | POA: Diagnosis not present

## 2018-04-27 DIAGNOSIS — L0291 Cutaneous abscess, unspecified: Secondary | ICD-10-CM | POA: Diagnosis not present

## 2018-05-09 ENCOUNTER — Telehealth (HOSPITAL_COMMUNITY): Payer: Self-pay

## 2018-05-09 NOTE — Telephone Encounter (Signed)
Called to schedule f/u, no answer, no vm. AW 

## 2018-06-13 DIAGNOSIS — Z0000 Encounter for general adult medical examination without abnormal findings: Secondary | ICD-10-CM | POA: Diagnosis not present

## 2018-06-13 DIAGNOSIS — J439 Emphysema, unspecified: Secondary | ICD-10-CM | POA: Diagnosis not present

## 2018-06-13 DIAGNOSIS — Z8546 Personal history of malignant neoplasm of prostate: Secondary | ICD-10-CM | POA: Diagnosis not present

## 2018-06-13 DIAGNOSIS — E7800 Pure hypercholesterolemia, unspecified: Secondary | ICD-10-CM | POA: Diagnosis not present

## 2018-06-20 DIAGNOSIS — E7800 Pure hypercholesterolemia, unspecified: Secondary | ICD-10-CM | POA: Diagnosis not present

## 2018-06-20 DIAGNOSIS — J439 Emphysema, unspecified: Secondary | ICD-10-CM | POA: Diagnosis not present

## 2018-06-20 DIAGNOSIS — Z8546 Personal history of malignant neoplasm of prostate: Secondary | ICD-10-CM | POA: Diagnosis not present

## 2018-06-20 DIAGNOSIS — Z0000 Encounter for general adult medical examination without abnormal findings: Secondary | ICD-10-CM | POA: Diagnosis not present

## 2018-06-26 DIAGNOSIS — R3912 Poor urinary stream: Secondary | ICD-10-CM | POA: Diagnosis not present

## 2018-06-26 DIAGNOSIS — N200 Calculus of kidney: Secondary | ICD-10-CM | POA: Diagnosis not present

## 2018-06-26 DIAGNOSIS — Z8546 Personal history of malignant neoplasm of prostate: Secondary | ICD-10-CM | POA: Diagnosis not present

## 2018-08-20 MED FILL — TAMSULOSIN HCL 0.4 MG CAP: 0.4 | 30 days supply | Qty: 30 | Fill #0

## 2018-08-20 MED FILL — MECLIZINE 25 MG TABLET: 25 | 30 days supply | Qty: 60 | Fill #0

## 2018-08-20 MED FILL — ROSUVASTATIN CALCIUM 10 MG: 10 | 30 days supply | Qty: 30 | Fill #0

## 2018-08-31 ENCOUNTER — Ambulatory Visit
Admission: RE | Admit: 2018-08-31 | Discharge: 2018-08-31 | Disposition: A | Discharge status: Home / Self Care | Payer: Medicare Other | Source: Ambulatory Visit | Attending: Family Medicine | Admitting: Family Medicine

## 2018-08-31 ENCOUNTER — Other Ambulatory Visit: Payer: Self-pay

## 2018-08-31 ENCOUNTER — Other Ambulatory Visit: Payer: Self-pay | Admitting: Family Medicine

## 2018-08-31 DIAGNOSIS — R52 Pain, unspecified: Secondary | ICD-10-CM

## 2018-08-31 DIAGNOSIS — H1031 Unspecified acute conjunctivitis, right eye: Secondary | ICD-10-CM | POA: Diagnosis not present

## 2018-08-31 DIAGNOSIS — M25511 Pain in right shoulder: Secondary | ICD-10-CM | POA: Diagnosis not present

## 2018-09-08 DIAGNOSIS — Z9181 History of falling: Secondary | ICD-10-CM | POA: Diagnosis not present

## 2018-09-08 DIAGNOSIS — I350 Nonrheumatic aortic (valve) stenosis: Secondary | ICD-10-CM | POA: Diagnosis not present

## 2018-09-08 DIAGNOSIS — G8929 Other chronic pain: Secondary | ICD-10-CM | POA: Diagnosis not present

## 2018-09-08 DIAGNOSIS — I132 Hypertensive heart and chronic kidney disease with heart failure and with stage 5 chronic kidney disease, or end stage renal disease: Secondary | ICD-10-CM | POA: Diagnosis not present

## 2018-09-08 DIAGNOSIS — M25511 Pain in right shoulder: Secondary | ICD-10-CM | POA: Diagnosis not present

## 2018-09-08 DIAGNOSIS — N185 Chronic kidney disease, stage 5: Secondary | ICD-10-CM | POA: Diagnosis not present

## 2018-09-08 DIAGNOSIS — J439 Emphysema, unspecified: Secondary | ICD-10-CM | POA: Diagnosis not present

## 2018-09-08 DIAGNOSIS — E7800 Pure hypercholesterolemia, unspecified: Secondary | ICD-10-CM | POA: Diagnosis not present

## 2018-09-08 DIAGNOSIS — I2510 Atherosclerotic heart disease of native coronary artery without angina pectoris: Secondary | ICD-10-CM | POA: Diagnosis not present

## 2018-09-08 DIAGNOSIS — M19011 Primary osteoarthritis, right shoulder: Secondary | ICD-10-CM | POA: Diagnosis not present

## 2018-09-08 DIAGNOSIS — I5032 Chronic diastolic (congestive) heart failure: Secondary | ICD-10-CM | POA: Diagnosis not present

## 2018-09-13 ENCOUNTER — Telehealth (HOSPITAL_COMMUNITY): Payer: Self-pay

## 2018-09-13 NOTE — Telephone Encounter (Signed)
Called to schedule f/u cta head/neck, no answer, left vm. AW 

## 2018-09-17 MED FILL — TAMSULOSIN HCL 0.4 MG CAP: 0.4 | 30 days supply | Qty: 30 | Fill #1

## 2018-09-17 MED FILL — ROSUVASTATIN CALCIUM 10 MG: 10 | 30 days supply | Qty: 30 | Fill #1

## 2018-09-24 ENCOUNTER — Other Ambulatory Visit (HOSPITAL_COMMUNITY): Payer: Self-pay | Admitting: Interventional Radiology

## 2018-09-24 DIAGNOSIS — I729 Aneurysm of unspecified site: Secondary | ICD-10-CM

## 2018-09-25 DIAGNOSIS — L814 Other melanin hyperpigmentation: Secondary | ICD-10-CM | POA: Diagnosis not present

## 2018-09-25 DIAGNOSIS — L570 Actinic keratosis: Secondary | ICD-10-CM | POA: Diagnosis not present

## 2018-09-25 DIAGNOSIS — L821 Other seborrheic keratosis: Secondary | ICD-10-CM | POA: Diagnosis not present

## 2018-09-25 DIAGNOSIS — D229 Melanocytic nevi, unspecified: Secondary | ICD-10-CM | POA: Diagnosis not present

## 2018-09-26 DIAGNOSIS — H01021 Squamous blepharitis right upper eyelid: Secondary | ICD-10-CM | POA: Diagnosis not present

## 2018-09-26 DIAGNOSIS — H01025 Squamous blepharitis left lower eyelid: Secondary | ICD-10-CM | POA: Diagnosis not present

## 2018-09-26 DIAGNOSIS — H01024 Squamous blepharitis left upper eyelid: Secondary | ICD-10-CM | POA: Diagnosis not present

## 2018-09-26 DIAGNOSIS — H01022 Squamous blepharitis right lower eyelid: Secondary | ICD-10-CM | POA: Diagnosis not present

## 2018-09-26 MED FILL — ERYTHROMYCIN 0.5% EYE OINT: 5 | 7 days supply | Qty: 4 | Fill #0

## 2018-10-09 ENCOUNTER — Ambulatory Visit (HOSPITAL_COMMUNITY)
Admission: RE | Admit: 2018-10-09 | Discharge: 2018-10-09 | Disposition: A | Discharge status: Home / Self Care | Payer: Medicare Other | Source: Ambulatory Visit | Attending: Interventional Radiology | Admitting: Interventional Radiology

## 2018-10-09 ENCOUNTER — Other Ambulatory Visit: Payer: Self-pay

## 2018-10-09 ENCOUNTER — Ambulatory Visit (HOSPITAL_COMMUNITY): Payer: Medicare Other

## 2018-10-09 DIAGNOSIS — I671 Cerebral aneurysm, nonruptured: Secondary | ICD-10-CM | POA: Diagnosis not present

## 2018-10-09 DIAGNOSIS — I651 Occlusion and stenosis of basilar artery: Secondary | ICD-10-CM | POA: Diagnosis not present

## 2018-10-09 DIAGNOSIS — I729 Aneurysm of unspecified site: Secondary | ICD-10-CM | POA: Diagnosis not present

## 2018-10-09 LAB — POCT I-STAT CREATININE: Creatinine, Ser: 1.5 mg/dL — ABNORMAL HIGH (ref 0.61–1.24)

## 2018-10-09 MED ORDER — IOHEXOL 350 MG/ML SOLN
60.0000 mL | Freq: Once | INTRAVENOUS | Status: AC | PRN
  Administered 2018-10-09: 60 mL via INTRAVENOUS

## 2018-10-10 ENCOUNTER — Telehealth (HOSPITAL_COMMUNITY): Payer: Self-pay

## 2018-10-10 DIAGNOSIS — N185 Chronic kidney disease, stage 5: Secondary | ICD-10-CM | POA: Diagnosis not present

## 2018-10-10 DIAGNOSIS — I132 Hypertensive heart and chronic kidney disease with heart failure and with stage 5 chronic kidney disease, or end stage renal disease: Secondary | ICD-10-CM | POA: Diagnosis not present

## 2018-10-10 DIAGNOSIS — M25511 Pain in right shoulder: Secondary | ICD-10-CM | POA: Diagnosis not present

## 2018-10-10 DIAGNOSIS — Z9181 History of falling: Secondary | ICD-10-CM | POA: Diagnosis not present

## 2018-10-10 DIAGNOSIS — M19011 Primary osteoarthritis, right shoulder: Secondary | ICD-10-CM | POA: Diagnosis not present

## 2018-10-10 DIAGNOSIS — E7800 Pure hypercholesterolemia, unspecified: Secondary | ICD-10-CM | POA: Diagnosis not present

## 2018-10-10 DIAGNOSIS — J439 Emphysema, unspecified: Secondary | ICD-10-CM | POA: Diagnosis not present

## 2018-10-10 DIAGNOSIS — G8929 Other chronic pain: Secondary | ICD-10-CM | POA: Diagnosis not present

## 2018-10-10 DIAGNOSIS — I2510 Atherosclerotic heart disease of native coronary artery without angina pectoris: Secondary | ICD-10-CM | POA: Diagnosis not present

## 2018-10-10 DIAGNOSIS — I5032 Chronic diastolic (congestive) heart failure: Secondary | ICD-10-CM | POA: Diagnosis not present

## 2018-10-10 DIAGNOSIS — I350 Nonrheumatic aortic (valve) stenosis: Secondary | ICD-10-CM | POA: Diagnosis not present

## 2018-10-10 NOTE — Telephone Encounter (Signed)
Pt agreed to f/u in 3 months with angiogram. They will call if pt's sx get worse. AW

## 2018-10-17 DIAGNOSIS — H02132 Senile ectropion of right lower eyelid: Secondary | ICD-10-CM | POA: Diagnosis not present

## 2018-10-17 DIAGNOSIS — H02831 Dermatochalasis of right upper eyelid: Secondary | ICD-10-CM | POA: Diagnosis not present

## 2018-10-17 DIAGNOSIS — H02142 Spastic ectropion of right lower eyelid: Secondary | ICD-10-CM | POA: Diagnosis not present

## 2018-10-17 DIAGNOSIS — H04521 Eversion of right lacrimal punctum: Secondary | ICD-10-CM | POA: Diagnosis not present

## 2018-10-29 ENCOUNTER — Other Ambulatory Visit: Payer: Self-pay | Admitting: Cardiovascular Disease

## 2018-12-01 ENCOUNTER — Encounter (HOSPITAL_COMMUNITY): Payer: Self-pay | Admitting: Emergency Medicine

## 2018-12-01 ENCOUNTER — Other Ambulatory Visit: Payer: Self-pay

## 2018-12-01 ENCOUNTER — Inpatient Hospital Stay (HOSPITAL_COMMUNITY)
Admission: EM | Admit: 2018-12-01 | Discharge: 2018-12-03 | DRG: 379 | Disposition: A | Discharge status: Home / Self Care | Payer: Medicare Other | Attending: Internal Medicine | Admitting: Internal Medicine

## 2018-12-01 ENCOUNTER — Emergency Department (HOSPITAL_COMMUNITY): Payer: Medicare Other

## 2018-12-01 DIAGNOSIS — Z20828 Contact with and (suspected) exposure to other viral communicable diseases: Secondary | ICD-10-CM | POA: Diagnosis present

## 2018-12-01 DIAGNOSIS — R197 Diarrhea, unspecified: Secondary | ICD-10-CM | POA: Diagnosis not present

## 2018-12-01 DIAGNOSIS — K921 Melena: Secondary | ICD-10-CM | POA: Diagnosis not present

## 2018-12-01 DIAGNOSIS — I129 Hypertensive chronic kidney disease with stage 1 through stage 4 chronic kidney disease, or unspecified chronic kidney disease: Secondary | ICD-10-CM | POA: Diagnosis not present

## 2018-12-01 DIAGNOSIS — Z8673 Personal history of transient ischemic attack (TIA), and cerebral infarction without residual deficits: Secondary | ICD-10-CM

## 2018-12-01 DIAGNOSIS — N183 Chronic kidney disease, stage 3 (moderate): Secondary | ICD-10-CM | POA: Diagnosis not present

## 2018-12-01 DIAGNOSIS — I2510 Atherosclerotic heart disease of native coronary artery without angina pectoris: Secondary | ICD-10-CM | POA: Diagnosis present

## 2018-12-01 DIAGNOSIS — Z8249 Family history of ischemic heart disease and other diseases of the circulatory system: Secondary | ICD-10-CM | POA: Diagnosis not present

## 2018-12-01 DIAGNOSIS — K5731 Diverticulosis of large intestine without perforation or abscess with bleeding: Secondary | ICD-10-CM | POA: Diagnosis not present

## 2018-12-01 DIAGNOSIS — K625 Hemorrhage of anus and rectum: Secondary | ICD-10-CM | POA: Diagnosis not present

## 2018-12-01 DIAGNOSIS — Z8546 Personal history of malignant neoplasm of prostate: Secondary | ICD-10-CM

## 2018-12-01 DIAGNOSIS — K922 Gastrointestinal hemorrhage, unspecified: Secondary | ICD-10-CM | POA: Diagnosis not present

## 2018-12-01 DIAGNOSIS — Z7982 Long term (current) use of aspirin: Secondary | ICD-10-CM | POA: Diagnosis not present

## 2018-12-01 DIAGNOSIS — D696 Thrombocytopenia, unspecified: Secondary | ICD-10-CM | POA: Diagnosis present

## 2018-12-01 DIAGNOSIS — E785 Hyperlipidemia, unspecified: Secondary | ICD-10-CM | POA: Diagnosis not present

## 2018-12-01 DIAGNOSIS — Z03818 Encounter for observation for suspected exposure to other biological agents ruled out: Secondary | ICD-10-CM | POA: Diagnosis not present

## 2018-12-01 LAB — COMPREHENSIVE METABOLIC PANEL
ALT: 14 U/L (ref 0–44)
AST: 23 U/L (ref 15–41)
Albumin: 3.9 g/dL (ref 3.5–5.0)
Alkaline Phosphatase: 70 U/L (ref 38–126)
Anion gap: 10 (ref 5–15)
BUN: 25 mg/dL — ABNORMAL HIGH (ref 8–23)
CO2: 25 mmol/L (ref 22–32)
Calcium: 9.1 mg/dL (ref 8.9–10.3)
Chloride: 103 mmol/L (ref 98–111)
Creatinine, Ser: 1.4 mg/dL — ABNORMAL HIGH (ref 0.61–1.24)
GFR calc Af Amer: 51 mL/min — ABNORMAL LOW (ref >60)
GFR calc non Af Amer: 44 mL/min — ABNORMAL LOW (ref >60)
Glucose, Bld: 124 mg/dL — ABNORMAL HIGH (ref 70–99)
Potassium: 4.1 mmol/L (ref 3.5–5.1)
Sodium: 138 mmol/L (ref 135–145)
Total Bilirubin: 0.9 mg/dL (ref 0.3–1.2)
Total Protein: 6.8 g/dL (ref 6.5–8.1)

## 2018-12-01 LAB — ABO/RH: ABO/RH(D): O POS

## 2018-12-01 LAB — CBC
HCT: 43.9 % (ref 39.0–52.0)
Hemoglobin: 14.8 g/dL (ref 13.0–17.0)
MCH: 32.5 pg (ref 26.0–34.0)
MCHC: 33.7 g/dL (ref 30.0–36.0)
MCV: 96.5 fL (ref 80.0–100.0)
Platelets: 135 10*3/uL — ABNORMAL LOW (ref 150–400)
RBC: 4.55 MIL/uL (ref 4.22–5.81)
RDW: 13.2 % (ref 11.5–15.5)
WBC: 3.9 10*3/uL — ABNORMAL LOW (ref 4.0–10.5)
nRBC: 0 % (ref 0.0–0.2)

## 2018-12-01 LAB — TYPE AND SCREEN
ABO/RH(D): O POS
Antibody Screen: NEGATIVE

## 2018-12-01 LAB — PHOSPHORUS: Phosphorus: 3.6 mg/dL (ref 2.5–4.6)

## 2018-12-01 LAB — HEMOGLOBIN: Hemoglobin: 14.3 g/dL (ref 13.0–17.0)

## 2018-12-01 LAB — HEMATOCRIT: HCT: 42.3 % (ref 39.0–52.0)

## 2018-12-01 LAB — MAGNESIUM: Magnesium: 2.2 mg/dL (ref 1.7–2.4)

## 2018-12-01 MED ORDER — SODIUM CHLORIDE 0.9 % IV SOLN
INTRAVENOUS | Status: DC
  Administered 2018-12-02 (×2): via INTRAVENOUS

## 2018-12-01 MED ORDER — ADULT MULTIVITAMIN W/MINERALS CH
1.0000 | ORAL_TABLET | Freq: Every day | ORAL | Status: DC
  Administered 2018-12-02 – 2018-12-03 (×2): 1 via ORAL
  Filled 2018-12-01 (×2): qty 1

## 2018-12-01 MED ORDER — ENSURE ENLIVE PO LIQD
237.0000 mL | Freq: Two times a day (BID) | ORAL | Status: DC
  Administered 2018-12-02: 237 mL via ORAL
  Filled 2018-12-01: qty 237

## 2018-12-01 MED ORDER — HYDRALAZINE HCL 25 MG PO TABS: 25.0000 mg | ORAL_TABLET | Freq: Four times a day (QID) | ORAL | Status: DC | PRN

## 2018-12-01 MED ORDER — VITAMIN B-12 1000 MCG PO TABS
5000.0000 ug | ORAL_TABLET | Freq: Every day | ORAL | Status: DC
  Administered 2018-12-02 – 2018-12-03 (×2): 5000 ug via ORAL
  Filled 2018-12-01 (×2): qty 5

## 2018-12-01 MED ORDER — IOHEXOL 300 MG/ML  SOLN
100.0000 mL | Freq: Once | INTRAMUSCULAR | Status: AC | PRN
  Administered 2018-12-01: 100 mL via INTRAVENOUS

## 2018-12-01 MED ORDER — TURMERIC 500 MG PO TABS: 1500.0000 mg | ORAL_TABLET | Freq: Two times a day (BID) | ORAL | Status: DC

## 2018-12-01 MED ORDER — ROSUVASTATIN CALCIUM 5 MG PO TABS
10.0000 mg | ORAL_TABLET | Freq: Every day | ORAL | Status: DC
  Administered 2018-12-02 – 2018-12-03 (×2): 10 mg via ORAL
  Filled 2018-12-01 (×2): qty 2

## 2018-12-01 MED ORDER — POLYVINYL ALCOHOL 1.4 % OP SOLN
Freq: Two times a day (BID) | OPHTHALMIC | Status: DC
  Administered 2018-12-02 – 2018-12-03 (×4): via OPHTHALMIC
  Filled 2018-12-01: qty 15

## 2018-12-01 MED ORDER — TAMSULOSIN HCL 0.4 MG PO CAPS
0.4000 mg | ORAL_CAPSULE | Freq: Every day | ORAL | Status: DC
  Administered 2018-12-02 – 2018-12-03 (×2): 0.4 mg via ORAL
  Filled 2018-12-01 (×2): qty 1

## 2018-12-01 NOTE — Consult Note (Signed)
Referring Provider:  Dr. Shirlyn Goltz [facility] EDP) Primary Care Physician:  [name], FNP Primary Gastroenterologist: None (unassigned; is an [facility] primary patient)  Reason for Consultation: Hematochezia  HPI: Richard Reed is a 85 y.o. male with 4 episodes of painless bright red hematochezia today, without abdominal pain or syncopal symptoms.  He has no prior history of GI bleeding.  He has documented diverticulosis based on a CT scan performed in the emergency room, which did not show evidence of colitis, ischemic colitis, or diverticulitis.  The patient is on a daily 81 mg aspirin without other anticoagulation therapy (has history of coronary disease, stroke, CHF, and paroxysmal atrial fibrillation); of note, his BUN of 25 in the emergency room is unchanged from baseline.  Vital signs normal, hemoglobin in the ER 14.8, platelets slightly low at 135,000.  No history of antecedent constipation   Past Medical History:  Diagnosis Date  . Angina   . Aortic valve insufficiency, moderate    a. 02/2016 Echo: mild AS, mild to mod AI.  Marland Kitchen Arthritis   . Arthritis pain   . CAD (coronary artery disease)    a. 09/2010 Cath: RCA 40-60 ost, otw nl cors;  b. 03/2016 Lexiscan MV: EF 46%, no ischemia, low risk.  . Cancer ([place])   . Carotid disease, bilateral ([place])    a. 02/2016 Carotid U/S: 1-39% bilat ICA plaquing.  . Cerebral aneurysm    a. 02/2016 MRA: fusiform bilobed aneurysm @ R MCA bifurcation.  . Cerebrovascular disease    a. 02/2016 MRA: long segment narrowing of basilar artery, moderate bilat posterior inferior cerebellar artery dzs.  . Chronic combined systolic and diastolic CHF (congestive heart failure) ([place])    a. 02/2016 Admitted to hospital in [place] w/ CHF;  b. 02/2016 Echo ([facility]): EF 45-50%, diff HK, gr1DD, mild AS, mild to mod AI, mildly dil Ao root, PASP 80mmHg.  . CKD (chronic kidney disease), stage III ([place])   . Dysrhythmia   . Essential hypertension   . Hard of hearing   .  Hemiparesis and alteration of sensations as late effects of stroke ([place]) 04/29/2015  . Hiatal hernia   . History of kidney stones   . HOH (hard of hearing)   . Hyperlipidemia   . Kidney stones   . Moderate aortic insufficiency   . NICM (nonischemic cardiomyopathy) ([place])    a. 02/2016 Echo: EF 45-50%, diff HK;  b. 03/2016 Lexiscan MV: No ischemia, EF 46%.  Marland Kitchen NSVT (nonsustained ventricular tachycardia) ([place])    a. 03/2016 noted on event monitor.  Marland Kitchen PAF (paroxysmal atrial fibrillation) ([place])    a. 2012 - noted in setting of E coli bacteremia and septic shock.  . Pneumonia 03/23/2011   "first time"  . Prostate cancer ([place])   . Sepsis due to Escherichia coli ([place]) 03/24/11  . Syncope    a. 02/2016 presumed to be 2/2 hypotension/dehydration and basilar artery dzs noted on MRA.  Marland Kitchen TIA (transient ischemic attack)   . Vertebrobasilar insufficiency 04/28/2016  . Visual disturbance 03/19/2014    Past Surgical History:  Procedure Laterality Date  . CARDIAC CATHETERIZATION  09/2010   non obstructive CAD, 40-60% ostial RCA stenosis without dampening   . CATARACT EXTRACTION Bilateral   . cerebral angiography    . CHOLECYSTECTOMY  04/15/2011   Procedure: LAPAROSCOPIC CHOLECYSTECTOMY WITH INTRAOPERATIVE CHOLANGIOGRAM;  Surgeon: Belva Crome, MD;  Location: [facility];  Service: General;  Laterality: N/A;  . EXTRACORPOREAL SHOCK WAVE LITHOTRIPSY Left  12/12/2016   Procedure: LEFT EXTRACORPOREAL SHOCK WAVE LITHOTRIPSY (ESWL);  Surgeon: Ardis Hughs, MD;  Location: [facility];  Service: Urology;  Laterality: Left;  . IR GENERIC HISTORICAL  03/17/2016   IR ANGIO VERTEBRAL SEL SUBCLAVIAN INNOMINATE BILAT MOD SED 03/17/2016 [name], MD [facility]  . IR GENERIC HISTORICAL  03/17/2016   IR ANGIO INTRA EXTRACRAN SEL COM CAROTID INNOMINATE BILAT MOD SED 03/17/2016 [name], MD [facility]  . PROSTATE SURGERY     approx 10 years ago, seed implant radiation  . TONSILLECTOMY       Prior to Admission medications   Medication Sig Start Date End Date Taking? Authorizing Provider  aspirin EC 81 MG tablet Take 81 mg by mouth every evening.    Yes [provider]  CALCIUM CITRATE PO Take 1 tablet by mouth 2 (two) times daily.   Yes [provider]  CYANOCOBALAMIN PO Take 1 tablet by mouth daily. Vitamin B12   Yes [provider]  feeding supplement, ENSURE ENLIVE, (ENSURE ENLIVE) LIQD Take 237 mLs by mouth 2 (two) times daily between meals. 03/02/16  Yes Vann, Jessica U, DO  fish oil-omega-3 fatty acids 1000 MG capsule Take 1 g by mouth every evening.    Yes [provider]  Misc Natural Products (OSTEO BI-FLEX TRIPLE STRENGTH PO) Take 1 tablet by mouth 2 (two) times daily.   Yes [provider]  Multiple Vitamin (MULTIVITAMIN WITH MINERALS) TABS tablet Take 1 tablet by mouth daily.   Yes [provider]  Multiple Vitamins-Minerals (PRESERVISION AREDS PO) Take 1 tablet by mouth 2 (two) times daily.    Yes [provider]  Multiple Vitamins-Minerals (ZINC PO) Take 1 tablet by mouth daily.   Yes [provider]  Propylene Glycol (SYSTANE BALANCE OP) Place 1 drop into both eyes 2 (two) times daily.   Yes [provider]  rosuvastatin (CRESTOR) 10 MG tablet Take 1 tablet (10 mg total) by mouth daily. Televisit needed. Call office. 10/29/18  Yes Lorretta Harp, MD  tamsulosin (FLOMAX) 0.4 MG CAPS capsule Take 1 capsule (0.4 mg total) by mouth daily. 12/12/16  Yes Ardis Hughs, MD  TURMERIC PO Take 1 capsule by mouth daily.   Yes [provider]    No current facility-administered medications for this encounter.    Current Outpatient Medications  Medication Sig Dispense Refill  . aspirin EC 81 MG tablet Take 81 mg by mouth every evening.     Marland Kitchen CALCIUM CITRATE PO Take 1 tablet by mouth 2 (two) times daily.    . CYANOCOBALAMIN PO Take 1 tablet by mouth daily. Vitamin B12    .  feeding supplement, ENSURE ENLIVE, (ENSURE ENLIVE) LIQD Take 237 mLs by mouth 2 (two) times daily between meals. 237 mL 12  . fish oil-omega-3 fatty acids 1000 MG capsule Take 1 g by mouth every evening.     . Misc Natural Products (OSTEO BI-FLEX TRIPLE STRENGTH PO) Take 1 tablet by mouth 2 (two) times daily.    . Multiple Vitamin (MULTIVITAMIN WITH MINERALS) TABS tablet Take 1 tablet by mouth daily.    . Multiple Vitamins-Minerals (PRESERVISION AREDS PO) Take 1 tablet by mouth 2 (two) times daily.     . Multiple Vitamins-Minerals (ZINC PO) Take 1 tablet by mouth daily.    Marland Kitchen Propylene Glycol (SYSTANE BALANCE OP) Place 1 drop into both eyes 2 (two) times daily.    . rosuvastatin (CRESTOR) 10 MG tablet Take 1 tablet (10  mg total) by mouth daily. Televisit needed. Call office. 30 tablet 1  . tamsulosin (FLOMAX) 0.4 MG CAPS capsule Take 1 capsule (0.4 mg total) by mouth daily. 30 capsule 11  . TURMERIC PO Take 1 capsule by mouth daily.      Allergies as of 12/01/2018  . (No Known Allergies)    Family History  Problem Relation Age of Onset  . Heart failure Mother   . Heart disease Mother   . Heart disease Father   . Cancer Brother        prostate    Social History   Socioeconomic History  . Marital status: Married    Spouse name: Not on file  . Number of children: 2  . Years of education: HS  . Highest education level: Not on file  Occupational History  . Occupation: Retired  Scientific laboratory technician  . Financial resource strain: Not on file  . Food insecurity    Worry: Not on file    Inability: Not on file  . Transportation needs    Medical: Not on file    Non-medical: Not on file  Tobacco Use  . Smoking status: Never Smoker  . Smokeless tobacco: Never Used  Substance and Sexual Activity  . Alcohol use: No  . Drug use: No  . Sexual activity: Yes  Lifestyle  . Physical activity    Days per week: Not on file    Minutes per session: Not on file  . Stress: Not on file   Relationships  . Social Herbalist on phone: Not on file    Gets together: Not on file    Attends religious service: Not on file    Active member of club or organization: Not on file    Attends meetings of clubs or organizations: Not on file    Relationship status: Not on file  . Intimate partner violence    Fear of current or ex partner: Not on file    Emotionally abused: Not on file    Physically abused: Not on file    Forced sexual activity: Not on file  Other Topics Concern  . Not on file  Social History Narrative   Medical Decision Maker:  FEDERICO MAIORINO (wife) Cell. [phone] House: [phone].   Daughter: Nigel Sloop: Cell [phone]   Full Code   Patient is Left handed.   Patient drinks 1 cup of caffeine daily.   Patient is left handed.    Review of Systems: No chest pain or shortness of breath, no lower extremity edema.  Does admit to frequency of urination, no hematuria  Physical Exam: Vital signs in last 24 hours: Temp:  [97.8 F (36.6 C)] 97.8 F (36.6 C) (08/15 1419) Pulse Rate:  [77-94] 77 (08/15 1730) Resp:  [16] 16 (08/15 1419) BP: (116-171)/(75-91) 157/84 (08/15 1730) SpO2:  [95 %-100 %] 95 % (08/15 1730)   General:   Alert,  Well-developed, well-nourished, pleasant and cooperative in NAD, somewhat hard of hearing Head:  Normocephalic and atraumatic. Eyes:  Sclera clear, no icterus.   Lungs:  Clear throughout to auscultation.   No wheezes, crackles, or rhonchi. No evident respiratory distress. Heart:   Regular rate and rhythm; no murmurs, clicks, rubs,  or gallops. Abdomen:  Soft, nontender, nontympanitic, and nondistended. No masses, hepatosplenomegaly or ventral hernias noted. Normal bowel sounds, without bruits, guarding, or rebound.   Rectal: Bright red blood on digital exam by the emergency room physician assistant Msk:  Symmetrical without gross deformities. Pulses: Strong radial pulse Extremities:   Without clubbing,  cyanosis, or edema. Neurologic:  Alert and coherent; motor function not tested. Skin:  Intact without significant lesions or rashes.  Warm, dry, well perfused. Psych:   Alert and cooperative. Normal mood and affect.  Intake/Output from previous day: No intake/output data recorded. Intake/Output this shift: No intake/output data recorded.  Lab Results: Recent Labs    12/01/18 1530  WBC 3.9*  HGB 14.8  HCT 43.9  PLT 135*   BMET Recent Labs    12/01/18 1530  NA 138  K 4.1  CL 103  CO2 25  GLUCOSE 124*  BUN 25*  CREATININE 1.40*  CALCIUM 9.1   LFT Recent Labs    12/01/18 1530  PROT 6.8  ALBUMIN 3.9  AST 23  ALT 14  ALKPHOS 70  BILITOT 0.9   PT/INR No results for input(s): LABPROT, INR in the last 72 hours.  Studies/Results: Ct Abdomen Pelvis W Contrast  Result Date: 12/01/2018 CLINICAL DATA:  Acute, diffuse abdominal pain. Blood in the stools today. EXAM: CT ABDOMEN AND PELVIS WITH CONTRAST TECHNIQUE: Multidetector CT imaging of the abdomen and pelvis was performed using the standard protocol following bolus administration of intravenous contrast. CONTRAST:  152mL OMNIPAQUE IOHEXOL 300 MG/ML  SOLN COMPARISON:  06/28/2017 at [facility]. FINDINGS: Lower chest: Extensive dense coronary artery calcifications. Mildly enlarged heart. Moderate-sized hiatal hernia with a paraesophageal component. Previously noted pleural calcifications at the right lung base. Also currently demonstrated a small amount of calcified pleural plaque at the left lung base, not previously included. Also not previously included is a 3 mm faint nodular density in the right middle lobe on image number 4 series 4. Calcified granuloma in the right middle lobe. The previously demonstrated 1.7 x 0.9 cm right base nodular density, posteriorly, currently measures 1.0 x 0.7 cm on image number 14 series 4. More superiorly, this continues to have a somewhat triangular and linear configuration in an area of  linear scarring. Hepatobiliary: No focal liver abnormality is seen. Status post cholecystectomy. No biliary dilatation. Pancreas: Focal atrophy and partial fatty replacement in the pancreatic head is unchanged. The remainder of the pancreas is unremarkable. Spleen: Normal in size without focal abnormality. Adrenals/Urinary Tract: Normal appearing adrenal glands. The right kidney remains small and the left kidney remains malrotated. 4 mm mid right renal calculus and 3 Jason calculi in the posterior left kidney measuring up to 7 mm in maximum diameter each. No bladder or ureteral calculi are seen and no hydronephrosis. Bilateral bladder diverticula are unchanged. Stomach/Bowel: A moderately large hiatal hernia is again demonstrated. Multiple colonic diverticula without evidence of diverticulitis. Fluid filling the majority of the colon with no wall thickening or pericolonic soft tissue stranding. A right colon diverticulum is demonstrated. No evidence of appendicitis. Unremarkable small bowel. Vascular/Lymphatic: Atheromatous arterial calcifications without aneurysm. No enlarged lymph nodes. Reproductive: Prostate radiation seed implants. Other: Small left inguinal hernia containing fat. Musculoskeletal: Lumbar spine degenerative changes and mild lower thoracic spine degenerative changes. These include facet degenerative changes with associated grade 1 anterolisthesis at the L4-5 level. IMPRESSION: 1. No acute abnormality. 2. Fluid filling the majority of the colon, compatible with a diarrheal illness. 3. Colonic diverticulosis. 4. Bilateral, nonobstructing renal calculi. 5. Moderate-sized hiatal hernia with a paraesophageal component. 6. Stable probable area of nodular and linear scarring at the posterior right lung base. This could be re-evaluated with a chest CT without contrast in 1 year. 7. Bilateral calcified  pleural plaques compatible with previous asbestos exposure. 8. Extensive dense calcific coronary artery  atherosclerosis. Electronically Signed   By: Claudie Revering M.D.   On: 12/01/2018 17:02    Impression: Painless recurrent hematochezia without overt hemodynamic instability in an elderly patient with documented diverticulosis would be very compatible with diverticular hemorrhage.  Other possibilities would include vascular ectasia or aspirin induced colopathy (less likely).  It seems very unlikely that this is colonic neoplasia or, based on history and radiographic findings, stercoral ulceration or ischemic colitis.  Clinically, the severity of bleeding appears to be quite minor, in that there has not been a precipitous drop in hemoglobin nor hemodynamic instability.  However, it is very possible that this could change over time, which is why I think observation in the hospital for this elderly patient with multiple comorbidities is very advisable.  Plan: 1.  Clear liquid diet for now.  Can probably advance tomorrow, especially if bleeding stays quiescent. 2.  Would consider colonoscopy (primarily to look for alternative etiologies) if the patient has persistent, ongoing bleeding.  Otherwise, I would probably not put this patient through a colonoscopy although it would be reasonable to follow-up with outpatient Hemoccults after cessation of gross bleeding, to confirm that he does not have ongoing occult blood loss which might be indicative of an underlying neoplasm (doubt). 3.  If the patient has destabilizing, brisk, acute bleeding, consider bleeding scan with potential for interventional radiology.  However, given this patient's age, atherosclerotic disease, and age appropriate renal insufficiency, I think it would be best to avoid arteriography unless the patient was clearly unstable and it looked like the only alternative would be surgery.   LOS: 0 days   Youlanda Mighty Bailey Kolbe  12/01/2018, 6:24 PM   Pager [phone] If no answer or after 5 PM call [phone]

## 2018-12-01 NOTE — ED Triage Notes (Signed)
Pt. Stated, Richard Reed gone to [employer] about 4 times and have a lot of blood in my stool, this started this morning.

## 2018-12-01 NOTE — ED Provider Notes (Signed)
[place] EMERGENCY DEPARTMENT Provider Note   CSN: [id number] Arrival date & time: 12/01/18  1410    History   Chief Complaint Chief Complaint  Patient presents with   Rectal Bleeding    HPI Richard Reed is a 85 y.o. male with PMH/o CAD, Aortic insufficiency, CAD, hypertension, cerebral aneurysm, CKD who presents for evaluation of rectal bleeding that began today.  He reports that earlier this morning, he had a bowel movement that had bright red blood noted.  He states that his bowel movement was very watery.  He states that he proceeded to have 2 more episodes at home.  He reports that the third episode contained a large amount of bright red blood.  He states he has a little bit of abdominal discomfort right before he has a bowel movement but then that resolves.  He states he had 1 more episode here in the ED that was not as bad but still had bright red blood.  He has not noted any black or tarry stools, remind stools.  He states that he takes a baby aspirin but no other blood thinners.  He denies any recent falls.  He does not take frequent NSAIDs.  He has not been sick recently denies any recent fevers, chills, nausea/vomiting.  He denies any chest pain, difficulty breathing, dysuria.  He does not think that the blood is coming from his urine.  Wife: [id number] Daughter: [phone]      The history is provided by the patient.    Past Medical History:  Diagnosis Date   Angina    Aortic valve insufficiency, moderate    a. 02/2016 Echo: mild AS, mild to mod AI.   Arthritis    Arthritis pain    CAD (coronary artery disease)    a. 09/2010 Cath: RCA 40-60 ost, otw nl cors;  b. 03/2016 Lexiscan MV: EF 46%, no ischemia, low risk.   Cancer [facility])    Carotid disease, bilateral ([place])    a. 02/2016 Carotid U/S: 1-39% bilat ICA plaquing.   Cerebral aneurysm    a. 02/2016 MRA: fusiform bilobed aneurysm @ R MCA bifurcation.   Cerebrovascular disease    a. 02/2016 MRA: long segment narrowing of basilar artery, moderate bilat posterior inferior cerebellar artery dzs.   Chronic combined systolic and diastolic CHF (congestive heart failure) ([place])    a. 02/2016 Admitted to hospital in [place] w/ CHF;  b. 02/2016 Echo ([facility]): EF 45-50%, diff HK, gr1DD, mild AS, mild to mod AI, mildly dil Ao root, PASP 67mmHg.   CKD (chronic kidney disease), stage III ([place])    Dysrhythmia    Essential hypertension    Hard of hearing    Hemiparesis and alteration of sensations as late effects of stroke ([place]) 04/29/2015   Hiatal hernia    History of kidney stones    HOH (hard of hearing)    Hyperlipidemia    Kidney stones    Moderate aortic insufficiency    NICM (nonischemic cardiomyopathy) ([place])    a. 02/2016 Echo: EF 45-50%, diff HK;  b. 03/2016 Lexiscan MV: No ischemia, EF 46%.   NSVT (nonsustained ventricular tachycardia) ([place])    a. 03/2016 noted on event monitor.   PAF (paroxysmal atrial fibrillation) ([place])    a. 2012 - noted in setting of E coli bacteremia and septic shock.   Pneumonia 03/23/2011   "first time"   Prostate cancer ([place])    Sepsis due to Escherichia coli [facility]) 03/24/11  Syncope    a. 02/2016 presumed to be 2/2 hypotension/dehydration and basilar artery dzs noted on MRA.   TIA (transient ischemic attack)    Vertebrobasilar insufficiency 04/28/2016   Visual disturbance 03/19/2014    Patient Active Problem List   Diagnosis Date Noted   Rectal bleed 12/01/2018   Asymptomatic PVCs 05/22/2017   Vertebrobasilar insufficiency 04/28/2016   Coronary artery disease due to lipid rich plaque 04/26/2016   Calcified pleural plaque due to asbestos exposure 03/31/2016   Syncope 02/29/2016   LOC (loss of consciousness) ([place]) 02/28/2016   UTI (urinary tract infection) 02/28/2016   Hemiparesis and alteration of sensations as late effects of stroke ([place]) 04/29/2015   TIA (transient ischemic attack) 12/02/2014   Visual  disturbance 03/19/2014   Diplopia 03/19/2014   Essential hypertension 04/30/2013   Hyperlipidemia 04/30/2013   PAF (paroxysmal atrial fibrillation) ([place]) 10/29/2012   Aortic valve insufficiency, moderate 10/29/2012   Heme positive stool 07/17/2012   Postop check 64/40/3474   Biliary colic 25/95/6387   Prostate CA ([place]) 04/13/2011   Acute respiratory failure ([place]) 03/24/2011   Thrombocytopenia ([place]) 03/24/2011   Encephalopathy 56/43/3295   Metabolic acidosis 18/84/1660   Gastroenteritis 03/23/2011   Dehydration 03/23/2011   Acute on chronic renal failure ([place]) 03/23/2011   Tachycardia 03/23/2011   Hypotension 03/23/2011   Hard of hearing 03/23/2011   Chronic renal insufficiency, stage III (moderate) ([place]) 03/23/2011   Pulmonary edema 03/23/2011    Past Surgical History:  Procedure Laterality Date   CARDIAC CATHETERIZATION  09/2010   non obstructive CAD, 40-60% ostial RCA stenosis without dampening    CATARACT EXTRACTION Bilateral    cerebral angiography     CHOLECYSTECTOMY  04/15/2011   Procedure: LAPAROSCOPIC CHOLECYSTECTOMY WITH INTRAOPERATIVE CHOLANGIOGRAM;  Surgeon: Belva Crome, MD;  Location: [facility];  Service: General;  Laterality: N/A;   EXTRACORPOREAL SHOCK WAVE LITHOTRIPSY Left 12/12/2016   Procedure: LEFT EXTRACORPOREAL SHOCK WAVE LITHOTRIPSY (ESWL);  Surgeon: Ardis Hughs, MD;  Location: [facility];  Service: Urology;  Laterality: Left;   IR GENERIC HISTORICAL  03/17/2016   IR ANGIO VERTEBRAL SEL SUBCLAVIAN INNOMINATE BILAT MOD SED 03/17/2016 [name], MD [facility]   IR GENERIC HISTORICAL  03/17/2016   IR ANGIO INTRA EXTRACRAN SEL COM CAROTID INNOMINATE BILAT MOD SED 03/17/2016 [name], MD [facility]   PROSTATE SURGERY     approx 10 years ago, seed implant radiation   TONSILLECTOMY          Home Medications    Prior to Admission medications   Medication Sig Start Date End Date Taking? Authorizing  Provider  aspirin EC 81 MG tablet Take 81 mg by mouth daily after supper.    Yes [provider]  B Complex-C (SUPER B COMPLEX PO) Take 1 tablet by mouth daily.   Yes [provider]  Coenzyme Q10 (COQ10) 100 MG CAPS Take 300 mg by mouth every morning.   Yes [provider]  Cyanocobalamin 5000 MCG TBDP Take 5,000 mcg by mouth daily. Vitamin B12   Yes [provider]  feeding supplement, ENSURE ENLIVE, (ENSURE ENLIVE) LIQD Take 237 mLs by mouth 2 (two) times daily between meals. 03/02/16  Yes Vann, Jessica U, DO  Misc Natural Products (OSTEO BI-FLEX TRIPLE STRENGTH PO) Take 1 tablet by mouth 2 (two) times daily.   Yes [provider]  Multiple Minerals-Vitamins (CALCIUM-MAGNESIUM-ZINC-D3 PO) Take 1 tablet by mouth 2 (two) times daily.   Yes [provider]  Multiple Vitamin (MULTIVITAMIN  WITH MINERALS) TABS tablet Take 1 tablet by mouth daily. One a day men's 50 plus   Yes [provider]  Multiple Vitamins-Minerals (PRESERVISION AREDS PO) Take 1 tablet by mouth 2 (two) times daily.    Yes [provider]  Omega-3 Fatty Acids (FISH OIL PO) Take 400 mg by mouth daily after supper.   Yes [provider]  Propylene Glycol (SYSTANE BALANCE OP) Place 1 drop into both eyes 2 (two) times daily.   Yes [provider]  rosuvastatin (CRESTOR) 10 MG tablet Take 1 tablet (10 mg total) by mouth daily. Televisit needed. Call office. 10/29/18  Yes Lorretta Harp, MD  tamsulosin (FLOMAX) 0.4 MG CAPS capsule Take 1 capsule (0.4 mg total) by mouth daily. 12/12/16  Yes Ardis Hughs, MD  Turmeric 500 MG TABS Take 1,500 mg by mouth 2 (two) times daily.    Yes [provider]    Family History Family History  Problem Relation Age of Onset   Heart failure Mother    Heart disease Mother    Heart disease Father    Cancer Brother        prostate    Social History Social History   Tobacco Use   Smoking  status: Never Smoker   Smokeless tobacco: Never Used  Substance Use Topics   Alcohol use: No   Drug use: No     Allergies   Patient has no known allergies.   Review of Systems Review of Systems  Constitutional: Negative for fever.  Respiratory: Negative for cough and shortness of breath.   Cardiovascular: Negative for chest pain.  Gastrointestinal: Positive for abdominal pain and blood in stool. Negative for nausea and vomiting.  Genitourinary: Negative for dysuria and hematuria.  Neurological: Negative for headaches.  All other systems reviewed and are negative.    Physical Exam Updated Vital Signs BP (!) 157/84    Pulse 77    Temp 97.8 F (36.6 C) (Oral)    Resp 16    SpO2 95%   Physical Exam Vitals signs and nursing note reviewed. Exam conducted with a chaperone present.  Constitutional:      Appearance: Normal appearance. He is well-developed.  HENT:     Head: Normocephalic and atraumatic.  Eyes:     General: Lids are normal.     Conjunctiva/sclera: Conjunctivae normal.     Pupils: Pupils are equal, round, and reactive to light.  Neck:     Musculoskeletal: Full passive range of motion without pain.  Cardiovascular:     Rate and Rhythm: Normal rate and regular rhythm.     Pulses: Normal pulses.          Radial pulses are 2+ on the right side and 2+ on the left side.       Dorsalis pedis pulses are 2+ on the right side and 2+ on the left side.     Heart sounds: Normal heart sounds. No murmur. No friction rub. No gallop.   Pulmonary:     Effort: Pulmonary effort is normal.     Breath sounds: Normal breath sounds.     Comments: Lungs clear to auscultation bilaterally.  Symmetric chest rise.  No wheezing, rales, rhonchi. Abdominal:     Palpations: Abdomen is soft. Abdomen is not rigid.     Tenderness: There is no abdominal tenderness. There is no guarding.     Comments: Abdomen is soft, non-distended, non-tender. No rigidity, No guarding. No peritoneal signs.  Genitourinary:    Rectum: Guaiac result positive.     Comments: The exam was performed with a chaperone present.  Bright red blood noted on DRE.  No tenderness or mass noted. Musculoskeletal: Normal range of motion.  Skin:    General: Skin is warm and dry.     Capillary Refill: Capillary refill takes less than 2 seconds.  Neurological:     Mental Status: He is alert and oriented to person, place, and time.  Psychiatric:        Speech: Speech normal.      ED Treatments / Results  Labs (all labs ordered are listed, but only abnormal results are displayed) Labs Reviewed  COMPREHENSIVE METABOLIC PANEL - Abnormal; Notable for the following components:      Result Value   Glucose, Bld 124 (*)    BUN 25 (*)    Creatinine, Ser 1.40 (*)    GFR calc non Af Amer 44 (*)    GFR calc Af Amer 51 (*)    All other components within normal limits  CBC - Abnormal; Notable for the following components:   WBC 3.9 (*)    Platelets 135 (*)    All other components within normal limits  URINALYSIS, ROUTINE W REFLEX MICROSCOPIC  POC OCCULT BLOOD, ED  TYPE AND SCREEN  ABO/RH    EKG None  Radiology Ct Abdomen Pelvis W Contrast  Result Date: 12/01/2018 CLINICAL DATA:  Acute, diffuse abdominal pain. Blood in the stools today. EXAM: CT ABDOMEN AND PELVIS WITH CONTRAST TECHNIQUE: Multidetector CT imaging of the abdomen and pelvis was performed using the standard protocol following bolus administration of intravenous contrast. CONTRAST:  149mL OMNIPAQUE IOHEXOL 300 MG/ML  SOLN COMPARISON:  06/28/2017 at [facility]. FINDINGS: Lower chest: Extensive dense coronary artery calcifications. Mildly enlarged heart. Moderate-sized hiatal hernia with a paraesophageal component. Previously noted pleural calcifications at the right lung base. Also currently demonstrated a small amount of calcified pleural plaque at the left lung base, not previously included. Also not previously included is a 3 mm faint  nodular density in the right middle lobe on image number 4 series 4. Calcified granuloma in the right middle lobe. The previously demonstrated 1.7 x 0.9 cm right base nodular density, posteriorly, currently measures 1.0 x 0.7 cm on image number 14 series 4. More superiorly, this continues to have a somewhat triangular and linear configuration in an area of linear scarring. Hepatobiliary: No focal liver abnormality is seen. Status post cholecystectomy. No biliary dilatation. Pancreas: Focal atrophy and partial fatty replacement in the pancreatic head is unchanged. The remainder of the pancreas is unremarkable. Spleen: Normal in size without focal abnormality. Adrenals/Urinary Tract: Normal appearing adrenal glands. The right kidney remains small and the left kidney remains malrotated. 4 mm mid right renal calculus and 3 Jason calculi in the posterior left kidney measuring up to 7 mm in maximum diameter each. No bladder or ureteral calculi are seen and no hydronephrosis. Bilateral bladder diverticula are unchanged. Stomach/Bowel: A moderately large hiatal hernia is again demonstrated. Multiple colonic diverticula without evidence of diverticulitis. Fluid filling the majority of the colon with no wall thickening or pericolonic soft tissue stranding. A right colon diverticulum is demonstrated. No evidence of appendicitis. Unremarkable small bowel. Vascular/Lymphatic: Atheromatous arterial calcifications without aneurysm. No enlarged lymph nodes. Reproductive: Prostate radiation seed implants. Other: Small left inguinal hernia containing fat. Musculoskeletal: Lumbar spine degenerative changes and mild lower thoracic spine degenerative changes. These include facet degenerative changes with associated grade 1  anterolisthesis at the L4-5 level. IMPRESSION: 1. No acute abnormality. 2. Fluid filling the majority of the colon, compatible with a diarrheal illness. 3. Colonic diverticulosis. 4. Bilateral, nonobstructing renal  calculi. 5. Moderate-sized hiatal hernia with a paraesophageal component. 6. Stable probable area of nodular and linear scarring at the posterior right lung base. This could be re-evaluated with a chest CT without contrast in 1 year. 7. Bilateral calcified pleural plaques compatible with previous asbestos exposure. 8. Extensive dense calcific coronary artery atherosclerosis. Electronically Signed   By: Claudie Revering M.D.   On: 12/01/2018 17:02    Procedures Procedures (including critical care time)  Medications Ordered in ED Medications  hydrALAZINE (APRESOLINE) tablet 25 mg (has no administration in time range)  iohexol (OMNIPAQUE) 300 MG/ML solution 100 mL (100 mLs Intravenous Contrast Given 12/01/18 1626)     Initial Impression / Assessment and Plan / ED Course  I have reviewed the triage vital signs and the nursing notes.  Pertinent labs & imaging results that were available during my care of the patient were reviewed by me and considered in my medical decision making (see chart for details).        85 year old male who presents for evaluation of rectal bleeding that began this morning.  He is on baby aspirin but no other blood thinners.  Reports some abdominal discomfort right before bowel movements but otherwise no pain.  He states his bowel movements have been very watery but he does not think the blood is coming from his urine. Patient is afebrile, non-toxic appearing, sitting comfortably on examination table. Vital signs reviewed and stable.  On exam, abdomen is soft, nondistended.  No tenderness noted.  DRE did reveal bright red blood noted on rectal exam.  Concern for GI bleed versus hemorrhoids versus diverticulitis.  Plan for labs, urine, CT abdomen pelvis.  CBC shows leukopenia of 3.9 and Hgb 14.8. CMP shows BUN of 25 and Cr 1.40.   Discussed fecal occult with point-of-care laboratory was negative.  I did see evidence of bright red blood on my DRE.  CT scan shows no acute  abnormality.  There is mention of colonic diverticulosis with no evidence of diverticulitis.  He does have fluid filling majority of the colon compatible with diarrheal disease.  Patient is followed by primary care doctor with the goal.  He has had a colonoscopy several years ago but does not remember if there was anything abnormal.  We will plan to consult GI.  Given patient's age, recurrent episodes of bleeding, bleeding seen on DRE, will plan for admission.  Discussed patient with Dr. Cristina Gong [facility]).  Agrees with plan for admission for observation, IV hydration.  Patient can be on clear liquid diet.  If bleeding stops, he may not need a colonoscopy while in the hospital though if bleeding continues and he becomes hemodynamically unstable, they may do colonoscopy.  No need for patient to be n.p.o.  He can be on clear liquid diet.  We will plan to see the patient tomorrow but can be reached if patient status changes.  Appreciate his consult.  Will plan for admission.  Discussed patient with hospitalist who will accept for admission.   Portions of this note were generated with [employer]. Dictation errors may occur despite best attempts at proofreading.   Final Clinical Impressions(s) / ED Diagnoses   Final diagnoses:  Rectal bleeding    ED Discharge Orders    None       Volanda Napoleon,  PA-C 12/01/18 2058    Drenda Freeze, MD 12/01/18 2252

## 2018-12-01 NOTE — H&P (Signed)
History and Physical  Richard Reed ENM:[id number] DOB: Jun 17, 1925 DOA: 12/01/2018  Referring physician: ER provider PCP: [name], FNP  Outpatient Specialists:    Patient coming from: Home  Chief Complaint: Rectal bleed  HPI:  Patient is a 85 year old Caucasian male with multiple medical and cardiac history as documented below, including but not limited to chronic thrombocytopenia, prostate cancer, paroxysmal atrial fibrillation, nonischemic cardiomyopathy, nonsustained ventricular tachycardia, moderate aortic insufficiency, hyperlipidemia, chronic kidney disease stage III, TIA and coronary artery disease.  Patient is on aspirin.  Patient presents with 4 episodes of rectal bleed.  No associated abdominal pain, no significant drop in H/H and no hemodynamic instability.  BUN is stable at 25.  Patient's hemoglobin is 14.8 g/dL, which seems to be patient's baseline.  CT scan of the abdomen and pelvis revealed colonic diverticulosis, fluid-filled colon compatible with diarrheal illness, bilateral nonobstructing renal calculi, bilateral calcified pleural plaques and extensive dense calcific coronary artery atherosclerosis.  No headache, no neck pain, no chest pain, no shortness of breath, no URI symptoms, no fever or chills and no urinary symptoms.  Patient be admitted for further work up and management.  ER team is already consulted the GI team.  ED Course: On presentation to the hospital, temperature 97.8, blood pressure 157/84 mmHg, heart rate of 77 bpm, respiratory rate of 16 and O2 sat of 95% on room air.  Labs reveal hemoglobin of 14.8, platelet count of 135, sodium of 138, potassium of 4.1, BUN of 25, serum creatinine of 1.4 and blood sugar of 124.  Pertinent labs: As documented above.  Imaging: independently reviewed.   Review of Systems:  Negative for fever, visual changes, sore throat, rash, new muscle aches, chest pain, SOB, dysuria, n/v/abdominal pain.  Past Medical History:    Diagnosis Date   Angina    Aortic valve insufficiency, moderate    a. 02/2016 Echo: mild AS, mild to mod AI.   Arthritis    Arthritis pain    CAD (coronary artery disease)    a. 09/2010 Cath: RCA 40-60 ost, otw nl cors;  b. 03/2016 Lexiscan MV: EF 46%, no ischemia, low risk.   [facility])    Carotid disease, bilateral ([place])    a. 02/2016 Carotid U/S: 1-39% bilat ICA plaquing.   Cerebral aneurysm    a. 02/2016 MRA: fusiform bilobed aneurysm @ R MCA bifurcation.   Cerebrovascular disease    a. 02/2016 MRA: long segment narrowing of basilar artery, moderate bilat posterior inferior cerebellar artery dzs.   Chronic combined systolic and diastolic CHF (congestive heart failure) ([place])    a. 02/2016 Admitted to hospital in [place] w/ CHF;  b. 02/2016 Echo ([facility]): EF 45-50%, diff HK, gr1DD, mild AS, mild to mod AI, mildly dil Ao root, PASP 82mmHg.   CKD (chronic kidney disease), stage III ([place])    Dysrhythmia    Essential hypertension    Hard of hearing    Hemiparesis and alteration of sensations as late effects of stroke ([place]) 04/29/2015   Hiatal hernia    History of kidney stones    HOH (hard of hearing)    Hyperlipidemia    Kidney stones    Moderate aortic insufficiency    NICM (nonischemic cardiomyopathy) ([place])    a. 02/2016 Echo: EF 45-50%, diff HK;  b. 03/2016 Lexiscan MV: No ischemia, EF 46%.   NSVT (nonsustained ventricular tachycardia) ([place])    a. 03/2016 noted on event monitor.   PAF (paroxysmal atrial fibrillation) ([place])  a. 2012 - noted in setting of E coli bacteremia and septic shock.   Pneumonia 03/23/2011   "first time"   Prostate cancer ([place])    Sepsis due to Escherichia coli ([place]) 03/24/11   Syncope    a. 02/2016 presumed to be 2/2 hypotension/dehydration and basilar artery dzs noted on MRA.   TIA (transient ischemic attack)    Vertebrobasilar insufficiency 04/28/2016   Visual disturbance 03/19/2014    Past Surgical History:   Procedure Laterality Date   CARDIAC CATHETERIZATION  09/2010   non obstructive CAD, 40-60% ostial RCA stenosis without dampening    CATARACT EXTRACTION Bilateral    cerebral angiography     CHOLECYSTECTOMY  04/15/2011   Procedure: LAPAROSCOPIC CHOLECYSTECTOMY WITH INTRAOPERATIVE CHOLANGIOGRAM;  Surgeon: Belva Crome, MD;  Location: [facility];  Service: General;  Laterality: N/A;   EXTRACORPOREAL SHOCK WAVE LITHOTRIPSY Left 12/12/2016   Procedure: LEFT EXTRACORPOREAL SHOCK WAVE LITHOTRIPSY (ESWL);  Surgeon: Ardis Hughs, MD;  Location: [facility];  Service: Urology;  Laterality: Left;   IR GENERIC HISTORICAL  03/17/2016   IR ANGIO VERTEBRAL SEL SUBCLAVIAN INNOMINATE BILAT MOD SED 03/17/2016 [name], MD [facility]   IR GENERIC HISTORICAL  03/17/2016   IR ANGIO INTRA EXTRACRAN SEL COM CAROTID INNOMINATE BILAT MOD SED 03/17/2016 [name], MD [facility]   PROSTATE SURGERY     approx 10 years ago, seed implant radiation   TONSILLECTOMY       reports that he has never smoked. He has never used smokeless tobacco. He reports that he does not drink alcohol or use drugs.  No Known Allergies  Family History  Problem Relation Age of Onset   Heart failure Mother    Heart disease Mother    Heart disease Father    Cancer Brother        prostate     Prior to Admission medications   Medication Sig Start Date End Date Taking? Authorizing Provider  aspirin EC 81 MG tablet Take 81 mg by mouth daily after supper.    Yes [provider]  B Complex-C (SUPER B COMPLEX PO) Take 1 tablet by mouth daily.   Yes [provider]  Coenzyme Q10 (COQ10) 100 MG CAPS Take 300 mg by mouth every morning.   Yes [provider]  Cyanocobalamin 5000 MCG TBDP Take 5,000 mcg by mouth daily. Vitamin B12   Yes [provider]  feeding supplement, ENSURE ENLIVE, (ENSURE ENLIVE) LIQD Take 237 mLs by mouth 2 (two) times daily between meals. 03/02/16   Yes Vann, Jessica U, DO  Misc Natural Products (OSTEO BI-FLEX TRIPLE STRENGTH PO) Take 1 tablet by mouth 2 (two) times daily.   Yes [provider]  Multiple Minerals-Vitamins (CALCIUM-MAGNESIUM-ZINC-D3 PO) Take 1 tablet by mouth 2 (two) times daily.   Yes [provider]  Multiple Vitamin (MULTIVITAMIN WITH MINERALS) TABS tablet Take 1 tablet by mouth daily. One a day men's 50 plus   Yes [provider]  Multiple Vitamins-Minerals (PRESERVISION AREDS PO) Take 1 tablet by mouth 2 (two) times daily.    Yes [provider]  Omega-3 Fatty Acids (FISH OIL PO) Take 400 mg by mouth daily after supper.   Yes [provider]  Propylene Glycol (SYSTANE BALANCE OP) Place 1 drop into both eyes 2 (two) times daily.   Yes [provider]  rosuvastatin (CRESTOR) 10 MG tablet Take 1 tablet (10 mg total) by mouth daily. Televisit needed. Call office. 10/29/18  Yes [name],  Pearletha Forge, MD  tamsulosin (FLOMAX) 0.4 MG CAPS capsule Take 1 capsule (0.4 mg total) by mouth daily. 12/12/16  Yes Ardis Hughs, MD  Turmeric 500 MG TABS Take 1,500 mg by mouth 2 (two) times daily.    Yes [provider]    Physical Exam: Vitals:   12/01/18 1722 12/01/18 1724 12/01/18 1725 12/01/18 1730  BP: (!) 165/80 (!) 160/82 (!) 171/87 (!) 157/84  Pulse: 81 94 78 77  Resp:      Temp:      TempSrc:      SpO2: 100% 97% 98% 95%   Constitutional:   Appears calm and comfortable.  Hard of hearing Eyes:   No pallor. No jaundice.  ENMT:   external ears, nose appear normal Neck:   Neck is supple. No JVD Respiratory:   CTA bilaterally, no w/r/r.   Respiratory effort normal. No retractions or accessory muscle use Cardiovascular:   S1S2  No LE extremity edema   Abdomen:   Abdomen is soft and non tender. Organs are difficult to assess. Neurologic:   Awake and alert.  Moves all limbs.  Wt Readings from Last 3 Encounters:  02/21/18 63.6 kg  05/22/17  67.6 kg  12/12/16 66.4 kg    I have personally reviewed following labs and imaging studies  Labs on Admission:  CBC: Recent Labs  Lab 12/01/18 1530  WBC 3.9*  HGB 14.8  HCT 43.9  MCV 96.5  PLT 329*   Basic Metabolic Panel: Recent Labs  Lab 12/01/18 1530  NA 138  K 4.1  CL 103  CO2 25  GLUCOSE 124*  BUN 25*  CREATININE 1.40*  CALCIUM 9.1   Liver Function Tests: Recent Labs  Lab 12/01/18 1530  AST 23  ALT 14  ALKPHOS 70  BILITOT 0.9  PROT 6.8  ALBUMIN 3.9   No results for input(s): LIPASE, AMYLASE in the last 168 hours. No results for input(s): AMMONIA in the last 168 hours. Coagulation Profile: No results for input(s): INR, PROTIME in the last 168 hours. Cardiac Enzymes: No results for input(s): CKTOTAL, CKMB, CKMBINDEX, TROPONINI in the last 168 hours. BNP (last 3 results) No results for input(s): PROBNP in the last 8760 hours. HbA1C: No results for input(s): HGBA1C in the last 72 hours. CBG: No results for input(s): GLUCAP in the last 168 hours. Lipid Profile: No results for input(s): CHOL, HDL, LDLCALC, TRIG, CHOLHDL, LDLDIRECT in the last 72 hours. Thyroid Function Tests: No results for input(s): TSH, T4TOTAL, FREET4, T3FREE, THYROIDAB in the last 72 hours. Anemia Panel: No results for input(s): VITAMINB12, FOLATE, FERRITIN, TIBC, IRON, RETICCTPCT in the last 72 hours. Urine analysis:    Component Value Date/Time   COLORURINE YELLOW 02/28/2016 1530   APPEARANCEUR CLOUDY (A) 02/28/2016 1530   LABSPEC 1.009 02/28/2016 1530   PHURINE 5.0 02/28/2016 1530   GLUCOSEU NEGATIVE 02/28/2016 1530   HGBUR TRACE (A) 02/28/2016 1530   BILIRUBINUR NEGATIVE 02/28/2016 1530   BILIRUBINUR neg 07/17/2012 1436   KETONESUR NEGATIVE 02/28/2016 1530   PROTEINUR NEGATIVE 02/28/2016 1530   UROBILINOGEN 0.2 07/17/2012 1436   UROBILINOGEN 1.0 04/13/2011 0220   NITRITE NEGATIVE 02/28/2016 1530   LEUKOCYTESUR MODERATE (A) 02/28/2016 1530   Sepsis  Labs: @LABRCNTIP (procalcitonin:4,lacticidven:4) )No results found for this or any previous visit (from the past 240 hour(s)).    Radiological Exams on Admission: Ct Abdomen Pelvis W Contrast  Result Date: 12/01/2018 CLINICAL DATA:  Acute, diffuse abdominal pain. Blood in the stools today. EXAM: CT ABDOMEN  AND PELVIS WITH CONTRAST TECHNIQUE: Multidetector CT imaging of the abdomen and pelvis was performed using the standard protocol following bolus administration of intravenous contrast. CONTRAST:  151mL OMNIPAQUE IOHEXOL 300 MG/ML  SOLN COMPARISON:  06/28/2017 at [facility]. FINDINGS: Lower chest: Extensive dense coronary artery calcifications. Mildly enlarged heart. Moderate-sized hiatal hernia with a paraesophageal component. Previously noted pleural calcifications at the right lung base. Also currently demonstrated a small amount of calcified pleural plaque at the left lung base, not previously included. Also not previously included is a 3 mm faint nodular density in the right middle lobe on image number 4 series 4. Calcified granuloma in the right middle lobe. The previously demonstrated 1.7 x 0.9 cm right base nodular density, posteriorly, currently measures 1.0 x 0.7 cm on image number 14 series 4. More superiorly, this continues to have a somewhat triangular and linear configuration in an area of linear scarring. Hepatobiliary: No focal liver abnormality is seen. Status post cholecystectomy. No biliary dilatation. Pancreas: Focal atrophy and partial fatty replacement in the pancreatic head is unchanged. The remainder of the pancreas is unremarkable. Spleen: Normal in size without focal abnormality. Adrenals/Urinary Tract: Normal appearing adrenal glands. The right kidney remains small and the left kidney remains malrotated. 4 mm mid right renal calculus and 3 Jason calculi in the posterior left kidney measuring up to 7 mm in maximum diameter each. No bladder or ureteral calculi are seen and no  hydronephrosis. Bilateral bladder diverticula are unchanged. Stomach/Bowel: A moderately large hiatal hernia is again demonstrated. Multiple colonic diverticula without evidence of diverticulitis. Fluid filling the majority of the colon with no wall thickening or pericolonic soft tissue stranding. A right colon diverticulum is demonstrated. No evidence of appendicitis. Unremarkable small bowel. Vascular/Lymphatic: Atheromatous arterial calcifications without aneurysm. No enlarged lymph nodes. Reproductive: Prostate radiation seed implants. Other: Small left inguinal hernia containing fat. Musculoskeletal: Lumbar spine degenerative changes and mild lower thoracic spine degenerative changes. These include facet degenerative changes with associated grade 1 anterolisthesis at the L4-5 level. IMPRESSION: 1. No acute abnormality. 2. Fluid filling the majority of the colon, compatible with a diarrheal illness. 3. Colonic diverticulosis. 4. Bilateral, nonobstructing renal calculi. 5. Moderate-sized hiatal hernia with a paraesophageal component. 6. Stable probable area of nodular and linear scarring at the posterior right lung base. This could be re-evaluated with a chest CT without contrast in 1 year. 7. Bilateral calcified pleural plaques compatible with previous asbestos exposure. 8. Extensive dense calcific coronary artery atherosclerosis. Electronically Signed   By: Claudie Revering M.D.   On: 12/01/2018 17:02    Active Problems:   Rectal bleed   Assessment/Plan Rectal bleed, worrisome for possible diverticular bleed: Admit patient for further assessment and management Monitor H/H Type and cross and hold. Supportive care for now. GI input is appreciated. Further management will depend on hospital course.  CKD stage III: Stable. Continue to monitor.  Coronary artery disease: No chest pain. Stable.  Chronic thrombocytopenia: Chronic, and remains stable. Query etiology. We defer to to the  PCP.  Hypertension: Continue to optimize.  DVT prophylaxis: SCD Code Status: Full code Family Communication:  Disposition Plan: Home eventually Consults called: ER provider has consulted the GI team Admission status: Inpatient  Time spent: 65 minutes  Dana Allan, MD  [facility] Pager #: [phone] 7PM-7AM contact night coverage as above  12/01/2018, 8:19 PM

## 2018-12-01 NOTE — ED Notes (Signed)
Byram [name] Daughter

## 2018-12-01 NOTE — Progress Notes (Signed)
PHARMACIST - PHYSICIAN ORDER COMMUNICATION ° °CONCERNING: P&T Medication Policy on Herbal Medications ° °DESCRIPTION:  This patient’s order for:  Turmeric  has been noted. ° °This product(s) is classified as an “herbal” or natural product. °Due to a lack of definitive safety studies or FDA approval, nonstandard manufacturing practices, plus the potential risk of unknown drug-drug interactions while on inpatient medications, the Pharmacy and Therapeutics Committee does not permit the use of “herbal” or natural products of this type within [place]. °  °ACTION TAKEN: °The pharmacy department is unable to verify this order at this time and your patient has been informed of this safety policy. °Please reevaluate patient’s clinical condition at discharge and address if the herbal or natural product(s) should be resumed at that time. ° °

## 2018-12-01 NOTE — ED Notes (Signed)
ED TO INPATIENT HANDOFF REPORT  ED Nurse Name and Phone #: Richard Reed [phone]  T Name/Age/Gender Richard Reed 85 y.o. male Room/Bed: 032C/032C  Code Status   Code Status: Prior  Home/SNF/Other Home Patient oriented to: self, place, time and situation Is this baseline? Yes   Triage Complete: Triage complete  Chief Complaint blood in stool  Triage Note Pt. Stated, Richard Reed gone to [employer] about 4 times and have a lot of blood in my stool, this started this morning.   Allergies No Known Allergies  Level of Care/Admitting Diagnosis ED Disposition    ED Disposition Condition Comment   Admit  The patient appears reasonably stabilized for admission considering the current resources, flow, and capabilities available in the ED at this time, and I doubt any other [facility] requiring further screening and/or treatment in the ED prior to admission is  present.       B Medical/Surgery History Past Medical History:  Diagnosis Date  . Angina   . Aortic valve insufficiency, moderate    a. 02/2016 Echo: mild AS, mild to mod AI.  Marland Kitchen Arthritis   . Arthritis pain   . CAD (coronary artery disease)    a. 09/2010 Cath: RCA 40-60 ost, otw nl cors;  b. 03/2016 Lexiscan MV: EF 46%, no ischemia, low risk.  . Cancer ([place])   . Carotid disease, bilateral ([place])    a. 02/2016 Carotid U/S: 1-39% bilat ICA plaquing.  . Cerebral aneurysm    a. 02/2016 MRA: fusiform bilobed aneurysm @ R MCA bifurcation.  . Cerebrovascular disease    a. 02/2016 MRA: long segment narrowing of basilar artery, moderate bilat posterior inferior cerebellar artery dzs.  . Chronic combined systolic and diastolic CHF (congestive heart failure) ([place])    a. 02/2016 Admitted to hospital in [place] w/ CHF;  b. 02/2016 Echo ([facility]): EF 45-50%, diff HK, gr1DD, mild AS, mild to mod AI, mildly dil Ao root, PASP 19mmHg.  . CKD (chronic kidney disease), stage III ([place])   . Dysrhythmia   . Essential hypertension   . Hard of hearing   .  Hemiparesis and alteration of sensations as late effects of stroke ([place]) 04/29/2015  . Hiatal hernia   . History of kidney stones   . HOH (hard of hearing)   . Hyperlipidemia   . Kidney stones   . Moderate aortic insufficiency   . NICM (nonischemic cardiomyopathy) ([place])    a. 02/2016 Echo: EF 45-50%, diff HK;  b. 03/2016 Lexiscan MV: No ischemia, EF 46%.  Marland Kitchen NSVT (nonsustained ventricular tachycardia) ([place])    a. 03/2016 noted on event monitor.  Marland Kitchen PAF (paroxysmal atrial fibrillation) ([place])    a. 2012 - noted in setting of E coli bacteremia and septic shock.  . Pneumonia 03/23/2011   "first time"  . Prostate cancer ([place])   . Sepsis due to Escherichia coli ([place]) 03/24/11  . Syncope    a. 02/2016 presumed to be 2/2 hypotension/dehydration and basilar artery dzs noted on MRA.  Marland Kitchen TIA (transient ischemic attack)   . Vertebrobasilar insufficiency 04/28/2016  . Visual disturbance 03/19/2014   Past Surgical History:  Procedure Laterality Date  . CARDIAC CATHETERIZATION  09/2010   non obstructive CAD, 40-60% ostial RCA stenosis without dampening   . CATARACT EXTRACTION Bilateral   . cerebral angiography    . CHOLECYSTECTOMY  04/15/2011   Procedure: LAPAROSCOPIC CHOLECYSTECTOMY WITH INTRAOPERATIVE CHOLANGIOGRAM;  Surgeon: Belva Crome, MD;  Location: [facility];  Service: General;  Laterality: N/A;  .  EXTRACORPOREAL SHOCK WAVE LITHOTRIPSY Left 12/12/2016   Procedure: LEFT EXTRACORPOREAL SHOCK WAVE LITHOTRIPSY (ESWL);  Surgeon: Ardis Hughs, MD;  Location: [facility];  Service: Urology;  Laterality: Left;  . IR GENERIC HISTORICAL  03/17/2016   IR ANGIO VERTEBRAL SEL SUBCLAVIAN INNOMINATE BILAT MOD SED 03/17/2016 [name], MD [facility]  . IR GENERIC HISTORICAL  03/17/2016   IR ANGIO INTRA EXTRACRAN SEL COM CAROTID INNOMINATE BILAT MOD SED 03/17/2016 [name], MD [facility]  . PROSTATE SURGERY     approx 10 years ago, seed implant radiation  . TONSILLECTOMY       A IV  Location/Drains/Wounds Patient Lines/Drains/Airways Status   Active Line/Drains/Airways    Name:   Placement date:   Placement time:   Site:   Days:   Peripheral IV 04/04/17 Anterior;Left Antecubital   04/04/17    1510    Antecubital   606   Peripheral IV 10/09/18 Right Antecubital   10/09/18    1616    Antecubital   53   Peripheral IV 12/01/18 Right Forearm   12/01/18    1521    Forearm   less than 1   Incision 04/15/11 Abdomen Other (Comment)   04/15/11    1228     2787   Incision (Closed) 03/17/16 Groin Right   03/17/16    0946     989   Incision - 4 Ports Abdomen 1: Right;Upper 2: Right;Lower 3: Umbilicus 4: Mid   62/70/35    1219     2787          Intake/Output Last 24 hours No intake or output data in the 24 hours ending 12/01/18 1809  Labs/Imaging Results for orders placed or performed during the hospital encounter of 12/01/18 (from the past 48 hour(s))  Type and screen [place]     Status: None   Collection Time: 12/01/18  3:20 PM  Result Value Ref Range   ABO/RH(D) O POS    Antibody Screen NEG    Sample Expiration      12/04/2018,2359 Performed at [facility], [place] [address], [place], [place] [address]   ABO/Rh     Status: None   Collection Time: 12/01/18  3:20 PM  Result Value Ref Range   ABO/RH(D)      O POS Performed at [facility] [address] [place], [place] [address]   Comprehensive metabolic panel     Status: Abnormal   Collection Time: 12/01/18  3:30 PM  Result Value Ref Range   Sodium 138 135 - 145 mmol/L   Potassium 4.1 3.5 - 5.1 mmol/L   Chloride 103 98 - 111 mmol/L   CO2 25 22 - 32 mmol/L   Glucose, Bld 124 (H) 70 - 99 mg/dL   BUN 25 (H) 8 - 23 mg/dL   Creatinine, Ser 1.40 (H) 0.61 - 1.24 mg/dL   Calcium 9.1 8.9 - 10.3 mg/dL   Total Protein 6.8 6.5 - 8.1 g/dL   Albumin 3.9 3.5 - 5.0 g/dL   AST 23 15 - 41 U/L   ALT 14 0 - 44 U/L   Alkaline Phosphatase 70 38 - 126 U/L   Total Bilirubin 0.9 0.3 - 1.2 mg/dL    GFR calc non Af Amer 44 (L) >60 mL/min   GFR calc Af Amer 51 (L) >60 mL/min   Anion gap 10 5 - 15    Comment: Performed at [facility], [address] [place], [place] [address]  CBC     Status: Abnormal   Collection Time: 12/01/18  3:30 PM  Result Value Ref Range   WBC 3.9 (L) 4.0 - 10.5 K/uL   RBC 4.55 4.22 - 5.81 MIL/uL   Hemoglobin 14.8 13.0 - 17.0 g/dL   HCT 43.9 39.0 - 52.0 %   MCV 96.5 80.0 - 100.0 fL   MCH 32.5 26.0 - 34.0 pg   MCHC 33.7 30.0 - 36.0 g/dL   RDW 13.2 11.5 - 15.5 %   Platelets 135 (L) 150 - 400 K/uL   nRBC 0.0 0.0 - 0.2 %    Comment: Performed at [facility], [place] [address], [place], [place] [address]   Ct Abdomen Pelvis W Contrast  Result Date: 12/01/2018 CLINICAL DATA:  Acute, diffuse abdominal pain. Blood in the stools today. EXAM: CT ABDOMEN AND PELVIS WITH CONTRAST TECHNIQUE: Multidetector CT imaging of the abdomen and pelvis was performed using the standard protocol following bolus administration of intravenous contrast. CONTRAST:  134mL OMNIPAQUE IOHEXOL 300 MG/ML  SOLN COMPARISON:  06/28/2017 at [facility]. FINDINGS: Lower chest: Extensive dense coronary artery calcifications. Mildly enlarged heart. Moderate-sized hiatal hernia with a paraesophageal component. Previously noted pleural calcifications at the right lung base. Also currently demonstrated a small amount of calcified pleural plaque at the left lung base, not previously included. Also not previously included is a 3 mm faint nodular density in the right middle lobe on image number 4 series 4. Calcified granuloma in the right middle lobe. The previously demonstrated 1.7 x 0.9 cm right base nodular density, posteriorly, currently measures 1.0 x 0.7 cm on image number 14 series 4. More superiorly, this continues to have a somewhat triangular and linear configuration in an area of linear scarring. Hepatobiliary: No focal liver abnormality is seen. Status post cholecystectomy. No biliary  dilatation. Pancreas: Focal atrophy and partial fatty replacement in the pancreatic head is unchanged. The remainder of the pancreas is unremarkable. Spleen: Normal in size without focal abnormality. Adrenals/Urinary Tract: Normal appearing adrenal glands. The right kidney remains small and the left kidney remains malrotated. 4 mm mid right renal calculus and 3 Jason calculi in the posterior left kidney measuring up to 7 mm in maximum diameter each. No bladder or ureteral calculi are seen and no hydronephrosis. Bilateral bladder diverticula are unchanged. Stomach/Bowel: A moderately large hiatal hernia is again demonstrated. Multiple colonic diverticula without evidence of diverticulitis. Fluid filling the majority of the colon with no wall thickening or pericolonic soft tissue stranding. A right colon diverticulum is demonstrated. No evidence of appendicitis. Unremarkable small bowel. Vascular/Lymphatic: Atheromatous arterial calcifications without aneurysm. No enlarged lymph nodes. Reproductive: Prostate radiation seed implants. Other: Small left inguinal hernia containing fat. Musculoskeletal: Lumbar spine degenerative changes and mild lower thoracic spine degenerative changes. These include facet degenerative changes with associated grade 1 anterolisthesis at the L4-5 level. IMPRESSION: 1. No acute abnormality. 2. Fluid filling the majority of the colon, compatible with a diarrheal illness. 3. Colonic diverticulosis. 4. Bilateral, nonobstructing renal calculi. 5. Moderate-sized hiatal hernia with a paraesophageal component. 6. Stable probable area of nodular and linear scarring at the posterior right lung base. This could be re-evaluated with a chest CT without contrast in 1 year. 7. Bilateral calcified pleural plaques compatible with previous asbestos exposure. 8. Extensive dense calcific coronary artery atherosclerosis. Electronically Signed   By: Claudie Revering M.D.   On: 12/01/2018 17:02    Pending  Labs [employer] (From  admission, onward)    Start     Ordered   12/01/18 1523  Urinalysis, Routine w reflex microscopic  ONCE - STAT,   STAT     12/01/18 1522          Vitals/Pain Today's Vitals   12/01/18 1722 12/01/18 1724 12/01/18 1725 12/01/18 1730  BP: (!) 165/80 (!) 160/82 (!) 171/87 (!) 157/84  Pulse: 81 94 78 77  Resp:      Temp:      TempSrc:      SpO2: 100% 97% 98% 95%  PainSc:        Isolation Precautions No active isolations  Medications Medications  iohexol (OMNIPAQUE) 300 MG/ML solution 100 mL (100 mLs Intravenous Contrast Given 12/01/18 1626)    Mobility walks Moderate fall risk   Focused Assessments GI   R Recommendations: See Admitting Provider Note  Report given to:   Additional Notes:

## 2018-12-01 NOTE — ED Notes (Signed)
Verbal report given to [facility] RN

## 2018-12-02 LAB — URINALYSIS, ROUTINE W REFLEX MICROSCOPIC
Bacteria, UA: NONE SEEN
Bilirubin Urine: NEGATIVE
Glucose, UA: NEGATIVE mg/dL
Hgb urine dipstick: NEGATIVE
Ketones, ur: 5 mg/dL — AB
Nitrite: NEGATIVE
Protein, ur: NEGATIVE mg/dL
Specific Gravity, Urine: 1.018 (ref 1.005–1.030)
pH: 6 (ref 5.0–8.0)

## 2018-12-02 LAB — BASIC METABOLIC PANEL
Anion gap: 8 (ref 5–15)
BUN: 19 mg/dL (ref 8–23)
CO2: 26 mmol/L (ref 22–32)
Calcium: 8.9 mg/dL (ref 8.9–10.3)
Chloride: 103 mmol/L (ref 98–111)
Creatinine, Ser: 1.31 mg/dL — ABNORMAL HIGH (ref 0.61–1.24)
GFR calc Af Amer: 55 mL/min — ABNORMAL LOW (ref >60)
GFR calc non Af Amer: 48 mL/min — ABNORMAL LOW (ref >60)
Glucose, Bld: 94 mg/dL (ref 70–99)
Potassium: 4.1 mmol/L (ref 3.5–5.1)
Sodium: 137 mmol/L (ref 135–145)

## 2018-12-02 LAB — CBC
HCT: 42.2 % (ref 39.0–52.0)
Hemoglobin: 14.1 g/dL (ref 13.0–17.0)
MCH: 32.3 pg (ref 26.0–34.0)
MCHC: 33.4 g/dL (ref 30.0–36.0)
MCV: 96.6 fL (ref 80.0–100.0)
Platelets: 132 10*3/uL — ABNORMAL LOW (ref 150–400)
RBC: 4.37 MIL/uL (ref 4.22–5.81)
RDW: 13.1 % (ref 11.5–15.5)
WBC: 5 10*3/uL (ref 4.0–10.5)
nRBC: 0 % (ref 0.0–0.2)

## 2018-12-02 LAB — HEMATOCRIT: HCT: 42.6 % (ref 39.0–52.0)

## 2018-12-02 LAB — HEMOGLOBIN: Hemoglobin: 14.1 g/dL (ref 13.0–17.0)

## 2018-12-02 LAB — SARS CORONAVIRUS 2 (TAT 6-24 HRS): SARS Coronavirus 2: NEGATIVE

## 2018-12-02 NOTE — Progress Notes (Signed)
PROGRESS NOTE  Richard Reed  AQT:[id number] DOB: 1925-10-09  DOA: 12/01/2018 PCP: [name], FNP   Brief Narrative:  Richard Reed is a 85 y.o. male  multiple clinically medical history including but not limited to diverticulosis, proximal mitral fibrillation, CAD, previous CVA, CKD 3, hypertension and arthritis presenting with with 4 episodes of painless bright red blood per rectum, without abdominal pain nausea vomiting.  He is not on any anticoagulation, but has been on aspirin 81 mg daily.  Patient admitted for rectal bleeding   Assessment & Plan:   Active Problems:   Rectal bleed  Acute lower GI bleeding CT notable for colonic diverticulosis Monitor H&H and transfuse PRBC as needed Was on clears on admission-advance diet as tolerated H&H so far stable -GI consulting -recommended given patient's age and the high propensity for diverticular bleeding to recur after a day or 2 of quiescence to hold for patient for today, and consider discharge tomorrow as needed, and possibly hold aspirin for 2 weeks post discharge.  CKD stage III: Stable. Continue to monitor.  Coronary artery disease: No chest pain. Stable.  Chronic thrombocytopenia: Chronic, and remains stable. Monitor Outpatient follow-up on discharge  Hypertension: Monitor and adjust blood pressure control as needed . DVT prophylaxis: SCD  code Status: Full code  Disposition Plan: Home  Consultants:   GI, Dr. Cristina Gong  Procedures:     Antimicrobials:      Subjective: No acute events but overnight.  Denies any fever or chills.  No abdominal pain, no nausea vomiting.  Some minimal blood in stool noted earlier today.  Patient denies dizziness  Objective:  Vitals:   12/01/18 1725 12/01/18 1730 12/01/18 2245 12/02/18 0422  BP: (!) 171/87 (!) 157/84 (!) 141/103 135/78  Pulse: 78 77 73 76  Resp:   16 16  Temp:   97.9 F (36.6 C) 98.1 F (36.7 C)  TempSrc:   Oral Oral  SpO2: 98% 95% 98%  95%  Weight:   63 kg   Height:   5\' 8"  (1.727 m)     Intake/Output Summary (Last 24 hours) at 12/02/2018 1419 Last data filed at 12/02/2018 0900 Gross per 24 hour  Intake 583.82 ml  Output 900 ml  Net -316.18 ml   Filed Weights   12/01/18 2245  Weight: 63 kg    Examination:  General exam: Comfortable no acute distress Respiratory system: Clear to auscultation. Respiratory effort normal. Cardiovascular system: S1 & S2 heard, RRR. No JVD, murmurs, rubs, gallops or clicks. No pedal edema. Gastrointestinal system: Abdomen is nondistended, soft and nontender. No organomegaly or masses felt. Normal bowel sounds heard. Central nervous system: Alert and oriented. No focal neurological deficits. Extremities: Symmetric 5 x 5 power. Skin: No rashes, lesions or ulcers Psychiatry: Judgement and insight appear normal. Mood & affect appropriate.     Data Reviewed: I have personally reviewed following labs and imaging studies  CBC: Recent Labs  Lab 12/01/18 1530 12/01/18 2310 12/02/18 0637  WBC 3.9*  --   --   HGB 14.8 14.3 14.1  HCT 43.9 42.3 42.6  MCV 96.5  --   --   PLT 135*  --   --    Basic Metabolic Panel: Recent Labs  Lab 12/01/18 1530 12/01/18 2310 12/02/18 0637  NA 138  --  137  K 4.1  --  4.1  CL 103  --  103  CO2 25  --  26  GLUCOSE 124*  --  94  BUN  25*  --  19  CREATININE 1.40*  --  1.31*  CALCIUM 9.1  --  8.9  MG  --  2.2  --   PHOS  --  3.6  --    GFR: Estimated Creatinine Clearance: 33.4 mL/min (A) (by C-G formula based on SCr of 1.31 mg/dL (H)). Liver Function Tests: Recent Labs  Lab 12/01/18 1530  AST 23  ALT 14  ALKPHOS 70  BILITOT 0.9  PROT 6.8  ALBUMIN 3.9   No results for input(s): LIPASE, AMYLASE in the last 168 hours. No results for input(s): AMMONIA in the last 168 hours. Coagulation Profile: No results for input(s): INR, PROTIME in the last 168 hours. Cardiac Enzymes: No results for input(s): CKTOTAL, CKMB, CKMBINDEX, TROPONINI in  the last 168 hours. BNP (last 3 results) No results for input(s): PROBNP in the last 8760 hours. HbA1C: No results for input(s): HGBA1C in the last 72 hours. CBG: No results for input(s): GLUCAP in the last 168 hours. Lipid Profile: No results for input(s): CHOL, HDL, LDLCALC, TRIG, CHOLHDL, LDLDIRECT in the last 72 hours. Thyroid Function Tests: No results for input(s): TSH, T4TOTAL, FREET4, T3FREE, THYROIDAB in the last 72 hours. Anemia Panel: No results for input(s): VITAMINB12, FOLATE, FERRITIN, TIBC, IRON, RETICCTPCT in the last 72 hours.  Sepsis Labs: Recent Labs  Lab 12/01/18 1530  WBC 3.9*    Recent Results (from the past 240 hour(s))  SARS CORONAVIRUS 2 Nasal Swab Aptima Multi Swab     Status: None   Collection Time: 12/01/18 10:08 PM   Specimen: Aptima Multi Swab; Nasal Swab  Result Value Ref Range Status   SARS Coronavirus 2 NEGATIVE NEGATIVE Final    Comment: (NOTE) SARS-CoV-2 target nucleic acids are NOT DETECTED. The SARS-CoV-2 RNA is generally detectable in upper and lower respiratory specimens during the acute phase of infection. Negative results do not preclude SARS-CoV-2 infection, do not rule out co-infections with other pathogens, and should not be used as the sole basis for treatment or other patient management decisions. Negative results must be combined with clinical observations, patient history, and epidemiological information. The expected result is Negative. Fact Sheet for Patients: SugarRoll.be Fact Sheet for Healthcare Providers: https://www.woods-mathews.com/ This test is not yet approved or cleared by the [place] FDA and  has been authorized for detection and/or diagnosis of SARS-CoV-2 by FDA under an Emergency Use Authorization (EUA). This EUA will remain  in effect (meaning this test can be used) for the duration of the COVID-19 declaration under Section 56 4(b)(1) of the Act, 21 U.S.C.  section 360bbb-3(b)(1), unless the authorization is terminated or revoked sooner. Performed at [facility], [place] [address] [place], [place] [address]          Radiology Studies: Ct Abdomen Pelvis W Contrast  Result Date: 12/01/2018 CLINICAL DATA:  Acute, diffuse abdominal pain. Blood in the stools today. EXAM: CT ABDOMEN AND PELVIS WITH CONTRAST TECHNIQUE: Multidetector CT imaging of the abdomen and pelvis was performed using the standard protocol following bolus administration of intravenous contrast. CONTRAST:  153mL OMNIPAQUE IOHEXOL 300 MG/ML  SOLN COMPARISON:  06/28/2017 at [facility]. FINDINGS: Lower chest: Extensive dense coronary artery calcifications. Mildly enlarged heart. Moderate-sized hiatal hernia with a paraesophageal component. Previously noted pleural calcifications at the right lung base. Also currently demonstrated a small amount of calcified pleural plaque at the left lung base, not previously included. Also not previously included is a 3 mm faint nodular density in the right middle lobe on  image number 4 series 4. Calcified granuloma in the right middle lobe. The previously demonstrated 1.7 x 0.9 cm right base nodular density, posteriorly, currently measures 1.0 x 0.7 cm on image number 14 series 4. More superiorly, this continues to have a somewhat triangular and linear configuration in an area of linear scarring. Hepatobiliary: No focal liver abnormality is seen. Status post cholecystectomy. No biliary dilatation. Pancreas: Focal atrophy and partial fatty replacement in the pancreatic head is unchanged. The remainder of the pancreas is unremarkable. Spleen: Normal in size without focal abnormality. Adrenals/Urinary Tract: Normal appearing adrenal glands. The right kidney remains small and the left kidney remains malrotated. 4 mm mid right renal calculus and 3 Jason calculi in the posterior left kidney measuring up to 7 mm in maximum diameter each. No bladder or  ureteral calculi are seen and no hydronephrosis. Bilateral bladder diverticula are unchanged. Stomach/Bowel: A moderately large hiatal hernia is again demonstrated. Multiple colonic diverticula without evidence of diverticulitis. Fluid filling the majority of the colon with no wall thickening or pericolonic soft tissue stranding. A right colon diverticulum is demonstrated. No evidence of appendicitis. Unremarkable small bowel. Vascular/Lymphatic: Atheromatous arterial calcifications without aneurysm. No enlarged lymph nodes. Reproductive: Prostate radiation seed implants. Other: Small left inguinal hernia containing fat. Musculoskeletal: Lumbar spine degenerative changes and mild lower thoracic spine degenerative changes. These include facet degenerative changes with associated grade 1 anterolisthesis at the L4-5 level. IMPRESSION: 1. No acute abnormality. 2. Fluid filling the majority of the colon, compatible with a diarrheal illness. 3. Colonic diverticulosis. 4. Bilateral, nonobstructing renal calculi. 5. Moderate-sized hiatal hernia with a paraesophageal component. 6. Stable probable area of nodular and linear scarring at the posterior right lung base. This could be re-evaluated with a chest CT without contrast in 1 year. 7. Bilateral calcified pleural plaques compatible with previous asbestos exposure. 8. Extensive dense calcific coronary artery atherosclerosis. Electronically Signed   By: Claudie Revering M.D.   On: 12/01/2018 17:02        Scheduled Meds: . feeding supplement (ENSURE ENLIVE)  237 mL Oral BID BM  . multivitamin with minerals  1 tablet Oral Daily  . polyvinyl alcohol   Both Eyes BID  . rosuvastatin  10 mg Oral Daily  . tamsulosin  0.4 mg Oral Daily  . vitamin B-12  5,000 mcg Oral Daily   Continuous Infusions: . sodium chloride 50 mL/hr at 12/02/18 0007     LOS: 1 day    Time spent: [place], MD [facility] Pager [phone]  If 7PM-7AM, please  contact night-coverage [email or web address] Password [facility] 12/02/2018, 2:19 PM

## 2018-12-02 NOTE — Plan of Care (Signed)
  Problem: Clinical Measurements: Goal: Diagnostic test results will improve Outcome: Progressing Hemoglobin stable; remains greater than 14   Problem: Elimination: Goal: Will not experience complications related to bowel motility Outcome: Progressing Patient had no further rectal bleeding since admitted to floor   Problem: Pain Managment: Goal: General experience of comfort will improve Outcome: Progressing Reports chronic right shoulder pain, requiring Tylenol at bedtime

## 2018-12-02 NOTE — Progress Notes (Signed)
Patient's hemoglobin remains essentially stable at 14.1.  1 small bowel movement with minimal blood today, per patient.  On exam, he is sitting up in bed and looks very chipper.  Skin is warm and well perfused, radial pulse is fairly strong, with no tachycardia.  Impression:   Transient recurrent hematochezia, most likely diverticular origin, without significant drop in hemoglobin, quiescent over the past 24 hours.  Recommendations:  1.  Will advance diet. 2.  Have cut down frequency of blood drawing 3.  Given patient's age, and the known propensity for diverticular bleeding to recur after a day or 2 of quiescence, I would not favor sending the patient home today, even though he is quite stable at this moment.  Discharge tomorrow might be reasonable if he remains stable. 4.  Discussed with patient and wife our current understanding that diverticular hemorrhage is not caused by dietary factors, so there is no need for dietary restriction following discharge. 5.  In general, we like to hold aspirin in patients with diverticular bleeding for a period of approximately 1 to 2 weeks following discharge, to allow for clot maturation and minimize risk of recurrent bleeding, unless the aspirin is felt to be critically needed (for example, recent stent placement)  Cleotis Nipper, M.D. Pager [phone] If no answer or after 5 PM call [phone]

## 2018-12-02 NOTE — Progress Notes (Addendum)
Received patient from ER into room around 2230 for Rectal Bleeding; Hemoglobin 14.8.  Alert, conversant, and hearing impaired with bilateral aids in place. Reports independent with ADLs, ambulating using a cane at home, and inability to control bladder. Cardiac monitor and external catheter applied - Sinus Rhythm in 70's. COVID-19 swab performed in ER and result pending.

## 2018-12-02 NOTE — Plan of Care (Signed)
  Problem: Education: Goal: Knowledge of General Education information will improve Description: Including pain rating scale, medication(s)/side effects and non-pharmacologic comfort measures Outcome: Progressing   Problem: Clinical Measurements: Goal: Ability to maintain clinical measurements within normal limits will improve Outcome: Progressing Goal: Diagnostic test results will improve Outcome: Progressing   Problem: Activity: Goal: Risk for activity intolerance will decrease Outcome: Progressing   Problem: Nutrition: Goal: Adequate nutrition will be maintained Outcome: Progressing   Problem: Safety: Goal: Ability to remain free from injury will improve Outcome: Progressing   Problem: Elimination: Goal: Will not experience complications related to bowel motility Outcome: Not Met (add Reason)   Problem: Clinical Measurements: Goal: Respiratory complications will improve Outcome: Not Applicable   Problem: Pain Managment: Goal: General experience of comfort will improve Outcome: Not Applicable   Problem: Skin Integrity: Goal: Risk for impaired skin integrity will decrease Outcome: Not Applicable

## 2018-12-03 LAB — CBC
HCT: 40.3 % (ref 39.0–52.0)
Hemoglobin: 13.5 g/dL (ref 13.0–17.0)
MCH: 31.8 pg (ref 26.0–34.0)
MCHC: 33.5 g/dL (ref 30.0–36.0)
MCV: 95 fL (ref 80.0–100.0)
Platelets: 126 10*3/uL — ABNORMAL LOW (ref 150–400)
RBC: 4.24 MIL/uL (ref 4.22–5.81)
RDW: 13.4 % (ref 11.5–15.5)
WBC: 3.5 10*3/uL — ABNORMAL LOW (ref 4.0–10.5)
nRBC: 0 % (ref 0.0–0.2)

## 2018-12-03 NOTE — Progress Notes (Signed)
Nsg Discharge Note  Admit Date:  12/01/2018 Discharge date: 12/03/2018   Richard Reed to be D/C'd Home per MD order.  AVS completed.  Copy for chart, and copy for patient signed, and dated. Patient/caregiver able to verbalize understanding.  Discharge Medication: Allergies as of 12/03/2018   No Known Allergies     Medication List    STOP taking these medications   aspirin EC 81 MG tablet   CALCIUM-MAGNESIUM-ZINC-D3 PO   CoQ10 100 MG Caps   multivitamin with minerals Tabs tablet   OSTEO BI-FLEX TRIPLE STRENGTH PO   Turmeric 500 MG Tabs     TAKE these medications   Cyanocobalamin 5000 MCG Tbdp Take 5,000 mcg by mouth daily. Vitamin B12   feeding supplement (ENSURE ENLIVE) Liqd Take 237 mLs by mouth 2 (two) times daily between meals.   FISH OIL PO Take 400 mg by mouth daily after supper.   PRESERVISION AREDS PO Take 1 tablet by mouth 2 (two) times daily.   rosuvastatin 10 MG tablet Commonly known as: CRESTOR Take 1 tablet (10 mg total) by mouth daily. Televisit needed. Call office.   SUPER B COMPLEX PO Take 1 tablet by mouth daily.   SYSTANE BALANCE OP Place 1 drop into both eyes 2 (two) times daily.   tamsulosin 0.4 MG Caps capsule Commonly known as: FLOMAX Take 1 capsule (0.4 mg total) by mouth daily.       Discharge Assessment: Vitals:   12/02/18 2136 12/03/18 0456  BP: 128/79 (!) 145/75  Pulse: 77 78  Resp:  20  Temp: 98.5 F (36.9 C) 98.2 F (36.8 C)  SpO2: 96% 98%   Skin clean, dry and intact without evidence of skin break down, no evidence of skin tears noted. IV catheter discontinued intact. Site without signs and symptoms of complications - no redness or edema noted at insertion site, patient denies c/o pain - only slight tenderness at site.  Dressing with slight pressure applied.  D/c Instructions-Education: Discharge instructions given to patient/family with verbalized understanding. D/c education completed with patient/family including  follow up instructions, medication list, d/c activities limitations if indicated, with other d/c instructions as indicated by MD - patient able to verbalize understanding, all questions fully answered. Patient instructed to return to ED, call 911, or call MD for any changes in condition.  Patient escorted via [place], and D/C home via private auto.  Tresa Endo, RN 12/03/2018 1:31 PM

## 2018-12-03 NOTE — Progress Notes (Signed)
Patient ID: Richard Reed, male   DOB: [birth year], 85 y.o.   MRN: [id number]  No further bleeding. Hgb stable at 13.5. Patient states he is going home today and is fully dressed sitting on the side of the bed.  Suspect resolved diverticular bleed. Conservative management. No plans for colonoscopy. Agree with D/C home from GI standpoint. Will sign off. F/U with GI prn.

## 2018-12-03 NOTE — Discharge Summary (Signed)
Physician Discharge Summary  Richard Reed XTA:[id number] DOB: [birth year] DOA: 12/01/2018  PCP: Richard Loader, FNP  Admit date: 12/01/2018 Discharge date: 12/03/2018  Time spent: 35 minutes  Recommendations for Outpatient Follow-up:  Follow-up with primary care physician in 1 week Discharged home with home health  Discharge Diagnoses:  Active Problems:   Rectal bleed   Discharge Condition: Stable  diet recommendation: Cardiac Filed Weights   12/01/18 2245  Weight: 63 kg    History of present illness and Hospital Course:  Richard Reed a 85 y.o.male multiple clinically medical history including but not limited to diverticulosis, proximal mitral fibrillation, CAD, previous CVA, CKD 3, hypertension and arthritis presenting with with 4 episodes of painless bright red blood per rectum, without abdominal pain nausea vomiting.  He is not on any anticoagulation, but has been on aspirin 81 mg daily.  Patient's hemoglobin remained stable.  GI consulted.  Considering patient's age, high propensity for diverticular bleeding, holding colonoscopy.  Patient did not have any further episodes of GI bleed.  Patient is tolerating the diet well.  Recommended to hold aspirin for 2 weeks.  Patient is ambulating with the help of the walker.  Patient will be discharged home with home health with physical therapy.  Consultations:  Gastroenterology  Discharge Exam: Vitals:   12/02/18 2136 12/03/18 0456  BP: 128/79 (!) 145/75  Pulse: 77 78  Resp:  20  Temp: 98.5 F (36.9 C) 98.2 F (36.8 C)  SpO2: 96% 98%    General exam: Comfortable no acute distress Respiratory system: Clear to auscultation. Respiratory effort normal. Cardiovascular system: S1 & S2 heard, RRR. No JVD, murmurs, rubs, gallops or clicks. No pedal edema. Gastrointestinal system: Abdomen is nondistended, soft and nontender. No organomegaly or masses felt. Normal bowel sounds heard. Central nervous system: Alert and oriented.  No focal neurological deficits. Extremities: Symmetric 5 x 5 power. Skin: No rashes, lesions or ulcers Psychiatry: Judgement and insight appear normal. Mood & affect appropriate.  Discharge Instructions   Discharge Instructions    Diet - low sodium heart healthy   Complete by: As directed    Increase activity slowly   Complete by: As directed      Allergies as of 12/03/2018   No Known Allergies     Medication List    STOP taking these medications   aspirin EC 81 MG tablet   CALCIUM-MAGNESIUM-ZINC-D3 PO   CoQ10 100 MG Caps   multivitamin with minerals Tabs tablet   OSTEO BI-FLEX TRIPLE STRENGTH PO   Turmeric 500 MG Tabs     TAKE these medications   Cyanocobalamin 5000 MCG Tbdp Take 5,000 mcg by mouth daily. Vitamin B12   feeding supplement (ENSURE ENLIVE) Liqd Take 237 mLs by mouth 2 (two) times daily between meals.   FISH OIL PO Take 400 mg by mouth daily after supper.   PRESERVISION AREDS PO Take 1 tablet by mouth 2 (two) times daily.   rosuvastatin 10 MG tablet Commonly known as: CRESTOR Take 1 tablet (10 mg total) by mouth daily. Televisit needed. Call office.   SUPER B COMPLEX PO Take 1 tablet by mouth daily.   SYSTANE BALANCE OP Place 1 drop into both eyes 2 (two) times daily.   tamsulosin 0.4 MG Caps capsule Commonly known as: FLOMAX Take 1 capsule (0.4 mg total) by mouth daily.      No Known Allergies Follow-up Information    Richard Loader, FNP. Schedule an appointment as soon as possible for a  visit in 1 week(s).   Specialty: Family Medicine Contact information: [place] [address] [phone]            The results of significant diagnostics from this hospitalization (including imaging, microbiology, ancillary and laboratory) are listed below for reference.    Significant Diagnostic Studies: Ct Abdomen Pelvis W Contrast  Result Date: 12/01/2018 CLINICAL DATA:  Acute, diffuse abdominal pain. Blood in the  stools today. EXAM: CT ABDOMEN AND PELVIS WITH CONTRAST TECHNIQUE: Multidetector CT imaging of the abdomen and pelvis was performed using the standard protocol following bolus administration of intravenous contrast. CONTRAST:  114mL OMNIPAQUE IOHEXOL 300 MG/ML  SOLN COMPARISON:  06/28/2017 at [facility]. FINDINGS: Lower chest: Extensive dense coronary artery calcifications. Mildly enlarged heart. Moderate-sized hiatal hernia with a paraesophageal component. Previously noted pleural calcifications at the right lung base. Also currently demonstrated a small amount of calcified pleural plaque at the left lung base, not previously included. Also not previously included is a 3 mm faint nodular density in the right middle lobe on image number 4 series 4. Calcified granuloma in the right middle lobe. The previously demonstrated 1.7 x 0.9 cm right base nodular density, posteriorly, currently measures 1.0 x 0.7 cm on image number 14 series 4. More superiorly, this continues to have a somewhat triangular and linear configuration in an area of linear scarring. Hepatobiliary: No focal liver abnormality is seen. Status post cholecystectomy. No biliary dilatation. Pancreas: Focal atrophy and partial fatty replacement in the pancreatic head is unchanged. The remainder of the pancreas is unremarkable. Spleen: Normal in size without focal abnormality. Adrenals/Urinary Tract: Normal appearing adrenal glands. The right kidney remains small and the left kidney remains malrotated. 4 mm mid right renal calculus and 3 Jason calculi in the posterior left kidney measuring up to 7 mm in maximum diameter each. No bladder or ureteral calculi are seen and no hydronephrosis. Bilateral bladder diverticula are unchanged. Stomach/Bowel: A moderately large hiatal hernia is again demonstrated. Multiple colonic diverticula without evidence of diverticulitis. Fluid filling the majority of the colon with no wall thickening or pericolonic soft  tissue stranding. A right colon diverticulum is demonstrated. No evidence of appendicitis. Unremarkable small bowel. Vascular/Lymphatic: Atheromatous arterial calcifications without aneurysm. No enlarged lymph nodes. Reproductive: Prostate radiation seed implants. Other: Small left inguinal hernia containing fat. Musculoskeletal: Lumbar spine degenerative changes and mild lower thoracic spine degenerative changes. These include facet degenerative changes with associated grade 1 anterolisthesis at the L4-5 level. IMPRESSION: 1. No acute abnormality. 2. Fluid filling the majority of the colon, compatible with a diarrheal illness. 3. Colonic diverticulosis. 4. Bilateral, nonobstructing renal calculi. 5. Moderate-sized hiatal hernia with a paraesophageal component. 6. Stable probable area of nodular and linear scarring at the posterior right lung base. This could be re-evaluated with a chest CT without contrast in 1 year. 7. Bilateral calcified pleural plaques compatible with previous asbestos exposure. 8. Extensive dense calcific coronary artery atherosclerosis. Electronically Signed   By: Claudie Revering M.D.   On: 12/01/2018 17:02    Microbiology: Recent Results (from the past 240 hour(s))  SARS CORONAVIRUS 2 Nasal Swab Aptima Multi Swab     Status: None   Collection Time: 12/01/18 10:08 PM   Specimen: Aptima Multi Swab; Nasal Swab  Result Value Ref Range Status   SARS Coronavirus 2 NEGATIVE NEGATIVE Final    Comment: (NOTE) SARS-CoV-2 target nucleic acids are NOT DETECTED. The SARS-CoV-2 RNA is generally detectable in upper and lower respiratory specimens during the  acute phase of infection. Negative results do not preclude SARS-CoV-2 infection, do not rule out co-infections with other pathogens, and should not be used as the sole basis for treatment or other patient management decisions. Negative results must be combined with clinical observations, patient history, and epidemiological information.  The expected result is Negative. Fact Sheet for Patients: SugarRoll.be Fact Sheet for Healthcare Providers: https://www.woods-mathews.com/ This test is not yet approved or cleared by the [place] FDA and  has been authorized for detection and/or diagnosis of SARS-CoV-2 by FDA under an Emergency Use Authorization (EUA). This EUA will remain  in effect (meaning this test can be used) for the duration of the COVID-19 declaration under Section 56 4(b)(1) of the Act, 21 U.S.C. section 360bbb-3(b)(1), unless the authorization is terminated or revoked sooner. Performed at [facility], [place] [address] [place], [place] [address]      Labs: Basic Metabolic Panel: Recent Labs  Lab 12/01/18 1530 12/01/18 2310 12/02/18 0637  NA 138  --  137  K 4.1  --  4.1  CL 103  --  103  CO2 25  --  26  GLUCOSE 124*  --  94  BUN 25*  --  19  CREATININE 1.40*  --  1.31*  CALCIUM 9.1  --  8.9  MG  --  2.2  --   PHOS  --  3.6  --    Liver Function Tests: Recent Labs  Lab 12/01/18 1530  AST 23  ALT 14  ALKPHOS 70  BILITOT 0.9  PROT 6.8  ALBUMIN 3.9   No results for input(s): LIPASE, AMYLASE in the last 168 hours. No results for input(s): AMMONIA in the last 168 hours. CBC: Recent Labs  Lab 12/01/18 1530 12/01/18 2310 12/02/18 0637 12/02/18 1629 12/03/18 0352  WBC 3.9*  --   --  5.0 3.5*  HGB 14.8 14.3 14.1 14.1 13.5  HCT 43.9 42.3 42.6 42.2 40.3  MCV 96.5  --   --  96.6 95.0  PLT 135*  --   --  132* 126*   Cardiac Enzymes: No results for input(s): CKTOTAL, CKMB, CKMBINDEX, TROPONINI in the last 168 hours. BNP: BNP (last 3 results) No results for input(s): BNP in the last 8760 hours.  ProBNP (last 3 results) No results for input(s): PROBNP in the last 8760 hours.  CBG: No results for input(s): GLUCAP in the last 168 hours.     SignedMonica Becton MD.  [facility] 12/03/2018, 2:03 PM

## 2018-12-04 ENCOUNTER — Telehealth: Payer: Self-pay | Admitting: *Deleted

## 2018-12-04 NOTE — Telephone Encounter (Signed)
   Primary Cardiologist:Jonathan [name], MD  Chart reviewed as part of pre-operative protocol coverage. Because of Richard Reed past medical history and time since last visit, he/she will require a follow-up visit in order to better assess preoperative cardiovascular risk.  Pre-op covering staff: - Please schedule appointment and call patient to inform them. - Please contact requesting surgeon's office via preferred method (i.e, phone, fax) to inform them of need for appointment prior to surgery.   Abigail Butts, PA-C  12/04/2018, 10:05 AM

## 2018-12-04 NOTE — Telephone Encounter (Signed)
   [facility] Pre-operative Risk Assessment    Request for surgical clearance:  1. What type of surgery is being performed? Right lower eyelid ectropion repair  2. When is this surgery scheduled? 09/09/2018  3. What type of clearance is required (medical clearance vs. Pharmacy clearance to hold med vs. Both)? both  4. Are there any medications that need to be held prior to surgery and how long?aspirin-asking for direction  5. Practice name and name of physician performing surgery? [facility]   6. What is your office phone number [phone]   7.   What is your office fax number [phone]  8.   Anesthesia type (None, local, MAC, general) ? Sharyl Nimrod 12/04/2018, 7:07 AM  _________________________________________________________________   (provider comments below)

## 2018-12-04 NOTE — Telephone Encounter (Signed)
Follow up scheduled 12/05/2018

## 2018-12-04 NOTE — Telephone Encounter (Signed)
Left messge on daughter, Richard Reed's, per dpr, vm advising patient to contact office to scheduled an appt for cardiac clearance.

## 2018-12-05 ENCOUNTER — Ambulatory Visit (INDEPENDENT_AMBULATORY_CARE_PROVIDER_SITE_OTHER): Payer: Medicare Other | Admitting: Cardiovascular Disease

## 2018-12-05 ENCOUNTER — Other Ambulatory Visit: Payer: Self-pay

## 2018-12-05 ENCOUNTER — Telehealth: Payer: Self-pay | Admitting: Cardiovascular Disease

## 2018-12-05 ENCOUNTER — Encounter: Payer: Self-pay | Admitting: Cardiovascular Disease

## 2018-12-05 DIAGNOSIS — I10 Essential (primary) hypertension: Secondary | ICD-10-CM

## 2018-12-05 DIAGNOSIS — I351 Nonrheumatic aortic (valve) insufficiency: Secondary | ICD-10-CM | POA: Diagnosis not present

## 2018-12-05 DIAGNOSIS — I2583 Coronary atherosclerosis due to lipid rich plaque: Secondary | ICD-10-CM

## 2018-12-05 DIAGNOSIS — E782 Mixed hyperlipidemia: Secondary | ICD-10-CM

## 2018-12-05 DIAGNOSIS — I2510 Atherosclerotic heart disease of native coronary artery without angina pectoris: Secondary | ICD-10-CM

## 2018-12-05 DIAGNOSIS — I480 Paroxysmal atrial fibrillation: Secondary | ICD-10-CM | POA: Diagnosis not present

## 2018-12-05 MED FILL — NEO/POLY/DEXAMET EYE OINT: 3.5-10000-0 | 3 days supply | Qty: 4 | Fill #0

## 2018-12-05 NOTE — Telephone Encounter (Signed)
Follow up    Sherlynn Stalls with [facility] is following up in reference to cardiac clearance

## 2018-12-05 NOTE — Telephone Encounter (Signed)
New Message    Patient had cardiac clearance visit today and Jenna from [facility] would like the clearance faxed to her [phone].

## 2018-12-05 NOTE — Assessment & Plan Note (Signed)
History of minimal coronary artery disease by cath performed June 2012 with a 40 to 50% ostial RCA stenosis without damping and otherwise insignificant CAD.  The patient is asymptomatic.

## 2018-12-05 NOTE — Assessment & Plan Note (Signed)
History of aortic valve disease with echo performed 03/01/2016 revealing an EF in the 50% range with mild left ear and moderate AI.  He is completely asymptomatic.  At age 85, I do not feel compelled to continue to follow this.

## 2018-12-05 NOTE — Progress Notes (Signed)
12/05/2018 Richard Reed   [birth year]  [id number]  Primary Physician Richard Loader, FNP Primary Cardiologist: Richard Harp MD Richard Reed, [place], [place]  HPI:  Richard Reed is a 85 y.o.  thin-appearing married Caucasian male, father of 74, grandfather to 10 grandchildren, who I last saw in the office  10/25/2016. He is accompanied by his wife today. He was hospitalized late 2012 with E. coli bacteremia and septic shock. He did develop some arrhythmias with PAF during that hospitalization. His EF was 45% to 50% with moderate aortic insufficiency. His other problems include hypertension and hyperlipidemia. I catheterized him in June 2012, revealing normal coronary arteries, with 40% to 60% ostial right coronary artery stenosis without damping. His right heart catheterization was unremarkable. He has had an uncomplicated laparoscopic cholecystectomy performed by Richard Reed, April 12, 2011. His most recent lab work performed 04/25/16 revealed total cholesterol 99, LDL of 47 and HDL 39. He was admitted to the hospital in [place] in November of last year with congestive heart failure. He is begun on diuretics. He admits that this was probably related to dietary indiscretion. He did have an episode of syncope substantive thought to be related to dehydration. Workup included carotid Dopplers that were unremarkable. Cerebral angiography did reveal an MCA aneurysm as well as a stenosis at the vertebrobasilar junction demonstrated by angiography performed by Richard Reed. Medical therapy was recommended. A 2-D echo revealed low normal LV function with EF of 50%, mild AS and moderate AI with mild elevation of pulmonary artery pressures and Myoview stress test was low risk as well. Since adjusting his diet he has become for  the most part asymptomatic. He did see Richard Reed in the office 07/20/16 which time he was cardiac stable what was complaining of some left hip pain with ambulation. They did change  the insoles and shoes which somewhat helped and try to statin holiday which did not impact his pain and therefore was placed back on statin therapy.  Since I saw him 2 years ago he is remained stable.  He was recently seen in the ER for diverticulosis.  He denies chest pain or shortness of breath.  He is hard of hearing which is a chronic problem.    Current Meds  Medication Sig  . B Complex-C (SUPER B COMPLEX PO) Take 1 tablet by mouth daily.  . Cyanocobalamin 5000 MCG TBDP Take 5,000 mcg by mouth daily. Vitamin B12  . feeding supplement, ENSURE ENLIVE, (ENSURE ENLIVE) LIQD Take 237 mLs by mouth 2 (two) times daily between meals.  . Multiple Vitamins-Minerals (PRESERVISION AREDS PO) Take 1 tablet by mouth 2 (two) times daily.   . Omega-3 Fatty Acids (FISH OIL PO) Take 400 mg by mouth daily after supper.  . Propylene Glycol (SYSTANE BALANCE OP) Place 1 drop into both eyes 2 (two) times daily.  . rosuvastatin (CRESTOR) 10 MG tablet Take 1 tablet (10 mg total) by mouth daily. Televisit needed. Call office.  . tamsulosin (FLOMAX) 0.4 MG CAPS capsule Take 1 capsule (0.4 mg total) by mouth daily.     No Known Allergies  Social History   Socioeconomic History  . Marital status: Married    Spouse name: Not on file  . Number of children: 2  . Years of education: HS  . Highest education level: Not on file  Occupational History  . Occupation: Retired  Scientific laboratory technician  . Financial resource strain: Not on file  . Food  insecurity    Worry: Not on file    Inability: Not on file  . Transportation needs    Medical: Not on file    Non-medical: Not on file  Tobacco Use  . Smoking status: Never Smoker  . Smokeless tobacco: Never Used  Substance and Sexual Activity  . Alcohol use: No  . Drug use: No  . Sexual activity: Yes  Lifestyle  . Physical activity    Days per week: Not on file    Minutes per session: Not on file  . Stress: Not on file  Relationships  . Social Herbalist  on phone: Not on file    Gets together: Not on file    Attends religious service: Not on file    Active member of club or organization: Not on file    Attends meetings of clubs or organizations: Not on file    Relationship status: Not on file  . Intimate partner violence    Fear of current or ex partner: Not on file    Emotionally abused: Not on file    Physically abused: Not on file    Forced sexual activity: Not on file  Other Topics Concern  . Not on file  Social History Narrative   Medical Decision Maker:  Richard Reed (wife) Cell. [phone] House: [phone].   Daughter: Richard Reed: Cell [phone]   Full Code   Patient is Left handed.   Patient drinks 1 cup of caffeine daily.   Patient is left handed.     Review of Systems: General: negative for chills, fever, night sweats or weight changes.  Cardiovascular: negative for chest pain, dyspnea on exertion, edema, orthopnea, palpitations, paroxysmal nocturnal dyspnea or shortness of breath Dermatological: negative for rash Respiratory: negative for cough or wheezing Urologic: negative for hematuria Abdominal: negative for nausea, vomiting, diarrhea, bright red blood per rectum, melena, or hematemesis Neurologic: negative for visual changes, syncope, or dizziness All other systems reviewed and are otherwise negative except as noted above.    Blood pressure 132/76, pulse 75, temperature (!) 97.2 F (36.2 C), height 5\' 8"  (1.727 m), weight 141 lb (64 kg).  General appearance: alert and no distress Neck: no adenopathy, no carotid bruit, no JVD, supple, symmetrical, trachea midline and thyroid not enlarged, symmetric, no tenderness/mass/nodules Lungs: clear to auscultation bilaterally Heart: regular rate and rhythm, S1, S2 normal, no murmur, click, rub or gallop Extremities: extremities normal, atraumatic, no cyanosis or edema Pulses: 2+ and symmetric Skin: Skin color, texture, turgor normal. No rashes or lesions  Neurologic: Alert and oriented X 3, normal strength and tone. Normal symmetric reflexes. Normal coordination and gait  EKG sinus rhythm at 75 with marked sinus arrhythmia left axis deviation with minimal voltage for LVH and nonspecific ST and T wave changes.  I personally reviewed this EKG.  ASSESSMENT AND PLAN:   PAF (paroxysmal atrial fibrillation) History of paroxysmal A. fib remotely in the setting of sepsis without recurrent episodes therefore not on anticoagulation.  Aortic valve insufficiency, moderate History of aortic valve disease with echo performed 03/01/2016 revealing an EF in the 50% range with mild left ear and moderate AI.  He is completely asymptomatic.  At age 26, I do not feel compelled to continue to follow this.  Essential hypertension History of essential hypertension with blood pressure measured at 132/76.  He is not on antihypertensive medications.  Hyperlipidemia History of hyperlipidemia on statin therapy with lipid profile performed 06/20/2018 revealing total cholesterol 106,  LDL 50 and HDL 46.  Coronary artery disease due to lipid rich plaque History of minimal coronary artery disease by cath performed June 2012 with a 40 to 50% ostial RCA stenosis without damping and otherwise insignificant CAD.  The patient is asymptomatic.      Richard Harp MD FACP,FACC,FAHA, [facility] 12/05/2018 11:21 AM

## 2018-12-05 NOTE — Assessment & Plan Note (Signed)
History of hyperlipidemia on statin therapy with lipid profile performed 06/20/2018 revealing total cholesterol 106, LDL 50 and HDL 46.

## 2018-12-05 NOTE — Patient Instructions (Addendum)
Medication Instructions:  Your physician recommends that you continue on your current medications as directed. Please refer to the Current Medication list given to you today.  If you need a refill on your cardiac medications before your next appointment, please call your pharmacy.   Lab work: none If you have labs (blood work) drawn today and your tests are completely normal, you will receive your results only by: Marland Kitchen MyChart Message (if you have MyChart) OR . A paper copy in the mail If you have any lab test that is abnormal or we need to change your treatment, we will call you to review the results.  Testing/Procedures: none  Follow-Up: At [facility], you and your health needs are our priority.  As part of our continuing mission to provide you with exceptional heart care, we have created designated Provider Care Teams.  These Care Teams include your primary Cardiologist (physician) and Advanced Practice Providers (APPs -  Physician Assistants and Nurse Practitioners) who all work together to provide you with the care you need, when you need it. . You will need a follow up appointment in 12 months with Dr. Quay Burow.  Please call our office 2 months in advance to schedule this appointment.     Any Other Special Instructions Will Be Listed Below (If Applicable). You have been cleared at low risk, from a cardiac standpoint, for your upcoming procedure. No further cardiovascular testing needed.

## 2018-12-05 NOTE — Telephone Encounter (Signed)
Okay to hold antiplatelet agents for blepharoplasty

## 2018-12-05 NOTE — Assessment & Plan Note (Signed)
History of essential hypertension with blood pressure measured at 132/76.  He is not on antihypertensive medications.

## 2018-12-05 NOTE — Assessment & Plan Note (Signed)
History of paroxysmal A. fib remotely in the setting of sepsis without recurrent episodes therefore not on anticoagulation.

## 2018-12-06 NOTE — Telephone Encounter (Signed)
Faxed over surgical clearance to requesting surgeon's office at the # provided below.

## 2018-12-06 NOTE — Telephone Encounter (Signed)
Could someone please fax the cath and echo reports to the fax number provided for clearance per Dr. Zadie Rhine request?

## 2018-12-06 NOTE — Telephone Encounter (Signed)
   Primary Cardiologist: Quay Burow, MD  Chart reviewed as part of pre-operative protocol coverage. Given past medical history and time since last visit, based on ACC/AHA guidelines, NOLE ROBEY would be at acceptable risk for the planned procedure without further cardiovascular testing.   Per Dr. [name], it is acceptable to hold antiplatelet agents prior to procedure and resume thereafter when safe per surgical team.   I will route this recommendation to the requesting party via Epic fax function and remove from pre-op pool.  Please call with questions.  Kathyrn Drown, NP 12/06/2018, 8:54 AM

## 2018-12-10 DIAGNOSIS — H02532 Eyelid retraction right lower eyelid: Secondary | ICD-10-CM | POA: Diagnosis not present

## 2018-12-10 DIAGNOSIS — H02112 Cicatricial ectropion of right lower eyelid: Secondary | ICD-10-CM | POA: Diagnosis not present

## 2018-12-10 DIAGNOSIS — H02132 Senile ectropion of right lower eyelid: Secondary | ICD-10-CM | POA: Diagnosis not present

## 2018-12-10 DIAGNOSIS — H02142 Spastic ectropion of right lower eyelid: Secondary | ICD-10-CM | POA: Diagnosis not present

## 2018-12-12 DIAGNOSIS — I480 Paroxysmal atrial fibrillation: Secondary | ICD-10-CM | POA: Diagnosis not present

## 2018-12-12 DIAGNOSIS — E7800 Pure hypercholesterolemia, unspecified: Secondary | ICD-10-CM | POA: Diagnosis not present

## 2018-12-12 DIAGNOSIS — K922 Gastrointestinal hemorrhage, unspecified: Secondary | ICD-10-CM | POA: Diagnosis not present

## 2018-12-12 DIAGNOSIS — J439 Emphysema, unspecified: Secondary | ICD-10-CM | POA: Diagnosis not present

## 2018-12-17 ENCOUNTER — Other Ambulatory Visit: Payer: Self-pay | Admitting: Cardiovascular Disease

## 2018-12-26 MED FILL — ERYTHROMYCIN EYE OINTMENT: 5 | 7 days supply | Qty: 4 | Fill #0

## 2019-01-16 DIAGNOSIS — Z23 Encounter for immunization: Secondary | ICD-10-CM | POA: Diagnosis not present

## 2019-02-06 ENCOUNTER — Telehealth (HOSPITAL_COMMUNITY): Payer: Self-pay

## 2019-02-06 NOTE — Telephone Encounter (Signed)
Called to schedule f/u angiogram, no answer, left vm. AW 

## 2019-02-11 ENCOUNTER — Telehealth (HOSPITAL_COMMUNITY): Payer: Self-pay

## 2019-02-11 ENCOUNTER — Other Ambulatory Visit (HOSPITAL_COMMUNITY): Payer: Self-pay | Admitting: Interventional Radiology

## 2019-02-11 DIAGNOSIS — I729 Aneurysm of unspecified site: Secondary | ICD-10-CM

## 2019-02-11 NOTE — Telephone Encounter (Signed)
Called to schedule angio, no answer, left vm. AW  

## 2019-02-28 DIAGNOSIS — Z961 Presence of intraocular lens: Secondary | ICD-10-CM | POA: Diagnosis not present

## 2019-02-28 DIAGNOSIS — H26493 Other secondary cataract, bilateral: Secondary | ICD-10-CM | POA: Diagnosis not present

## 2019-02-28 DIAGNOSIS — H04123 Dry eye syndrome of bilateral lacrimal glands: Secondary | ICD-10-CM | POA: Diagnosis not present

## 2019-02-28 DIAGNOSIS — D3131 Benign neoplasm of right choroid: Secondary | ICD-10-CM | POA: Diagnosis not present

## 2019-03-20 ENCOUNTER — Other Ambulatory Visit: Payer: Self-pay | Admitting: Student

## 2019-03-21 ENCOUNTER — Other Ambulatory Visit (HOSPITAL_COMMUNITY): Payer: Self-pay | Admitting: Interventional Radiology

## 2019-03-21 ENCOUNTER — Ambulatory Visit (HOSPITAL_COMMUNITY)
Admission: RE | Admit: 2019-03-21 | Discharge: 2019-03-21 | Disposition: A | Discharge status: Home / Self Care | Payer: Medicare Other | Source: Ambulatory Visit | Attending: Interventional Radiology | Admitting: Interventional Radiology

## 2019-03-21 ENCOUNTER — Other Ambulatory Visit: Payer: Self-pay

## 2019-03-21 ENCOUNTER — Ambulatory Visit (HOSPITAL_COMMUNITY): Admission: RE | Admit: 2019-03-21 | Payer: Medicare Other | Source: Ambulatory Visit

## 2019-03-21 DIAGNOSIS — I6523 Occlusion and stenosis of bilateral carotid arteries: Secondary | ICD-10-CM | POA: Insufficient documentation

## 2019-03-21 DIAGNOSIS — M1990 Unspecified osteoarthritis, unspecified site: Secondary | ICD-10-CM | POA: Diagnosis not present

## 2019-03-21 DIAGNOSIS — H9190 Unspecified hearing loss, unspecified ear: Secondary | ICD-10-CM | POA: Insufficient documentation

## 2019-03-21 DIAGNOSIS — Z8249 Family history of ischemic heart disease and other diseases of the circulatory system: Secondary | ICD-10-CM | POA: Diagnosis not present

## 2019-03-21 DIAGNOSIS — N183 Chronic kidney disease, stage 3 unspecified: Secondary | ICD-10-CM | POA: Insufficient documentation

## 2019-03-21 DIAGNOSIS — I5042 Chronic combined systolic (congestive) and diastolic (congestive) heart failure: Secondary | ICD-10-CM | POA: Insufficient documentation

## 2019-03-21 DIAGNOSIS — E785 Hyperlipidemia, unspecified: Secondary | ICD-10-CM | POA: Diagnosis not present

## 2019-03-21 DIAGNOSIS — I428 Other cardiomyopathies: Secondary | ICD-10-CM | POA: Insufficient documentation

## 2019-03-21 DIAGNOSIS — I671 Cerebral aneurysm, nonruptured: Secondary | ICD-10-CM | POA: Diagnosis not present

## 2019-03-21 DIAGNOSIS — I69359 Hemiplegia and hemiparesis following cerebral infarction affecting unspecified side: Secondary | ICD-10-CM | POA: Diagnosis not present

## 2019-03-21 DIAGNOSIS — I651 Occlusion and stenosis of basilar artery: Secondary | ICD-10-CM | POA: Diagnosis not present

## 2019-03-21 DIAGNOSIS — I729 Aneurysm of unspecified site: Secondary | ICD-10-CM

## 2019-03-21 DIAGNOSIS — I2510 Atherosclerotic heart disease of native coronary artery without angina pectoris: Secondary | ICD-10-CM | POA: Diagnosis not present

## 2019-03-21 DIAGNOSIS — I130 Hypertensive heart and chronic kidney disease with heart failure and stage 1 through stage 4 chronic kidney disease, or unspecified chronic kidney disease: Secondary | ICD-10-CM | POA: Diagnosis not present

## 2019-03-21 DIAGNOSIS — I6502 Occlusion and stenosis of left vertebral artery: Secondary | ICD-10-CM | POA: Diagnosis not present

## 2019-03-21 DIAGNOSIS — Z79899 Other long term (current) drug therapy: Secondary | ICD-10-CM | POA: Insufficient documentation

## 2019-03-21 HISTORY — PX: IR ANGIO VERTEBRAL SEL SUBCLAVIAN INNOMINATE BILAT MOD SED: IMG5366

## 2019-03-21 HISTORY — PX: IR ANGIO INTRA EXTRACRAN SEL COM CAROTID INNOMINATE BILAT MOD SED: IMG5360

## 2019-03-21 LAB — CBC WITH DIFFERENTIAL/PLATELET
Abs Immature Granulocytes: 0 10*3/uL (ref 0.00–0.07)
Basophils Absolute: 0 10*3/uL (ref 0.0–0.1)
Basophils Relative: 1 %
Eosinophils Absolute: 0.1 10*3/uL (ref 0.0–0.5)
Eosinophils Relative: 2 %
HCT: 45.6 % (ref 39.0–52.0)
Hemoglobin: 14.9 g/dL (ref 13.0–17.0)
Immature Granulocytes: 0 %
Lymphocytes Relative: 29 %
Lymphs Abs: 1.1 10*3/uL (ref 0.7–4.0)
MCH: 31.6 pg (ref 26.0–34.0)
MCHC: 32.7 g/dL (ref 30.0–36.0)
MCV: 96.8 fL (ref 80.0–100.0)
Monocytes Absolute: 0.3 10*3/uL (ref 0.1–1.0)
Monocytes Relative: 9 %
Neutro Abs: 2.2 10*3/uL (ref 1.7–7.7)
Neutrophils Relative %: 59 %
Platelets: 114 10*3/uL — ABNORMAL LOW (ref 150–400)
RBC: 4.71 MIL/uL (ref 4.22–5.81)
RDW: 13.3 % (ref 11.5–15.5)
WBC: 3.6 10*3/uL — ABNORMAL LOW (ref 4.0–10.5)
nRBC: 0 % (ref 0.0–0.2)

## 2019-03-21 LAB — BASIC METABOLIC PANEL
Anion gap: 10 (ref 5–15)
BUN: 24 mg/dL — ABNORMAL HIGH (ref 8–23)
CO2: 25 mmol/L (ref 22–32)
Calcium: 9.1 mg/dL (ref 8.9–10.3)
Chloride: 105 mmol/L (ref 98–111)
Creatinine, Ser: 1.34 mg/dL — ABNORMAL HIGH (ref 0.61–1.24)
GFR calc Af Amer: 53 mL/min — ABNORMAL LOW (ref >60)
GFR calc non Af Amer: 46 mL/min — ABNORMAL LOW (ref >60)
Glucose, Bld: 95 mg/dL (ref 70–99)
Potassium: 4.3 mmol/L (ref 3.5–5.1)
Sodium: 140 mmol/L (ref 135–145)

## 2019-03-21 LAB — PROTIME-INR
INR: 1.1 (ref 0.8–1.2)
Prothrombin Time: 13.8 seconds (ref 11.4–15.2)

## 2019-03-21 LAB — APTT: aPTT: 32 seconds (ref 24–36)

## 2019-03-21 MED ORDER — HEPARIN SODIUM (PORCINE) 1000 UNIT/ML IJ SOLN
INTRAMUSCULAR | Status: AC
  Filled 2019-03-21: qty 1

## 2019-03-21 MED ORDER — LIDOCAINE HCL 1 % IJ SOLN
INTRAMUSCULAR | Status: AC | PRN
  Administered 2019-03-21: 10 mL

## 2019-03-21 MED ORDER — IOHEXOL 300 MG/ML  SOLN
150.0000 mL | Freq: Once | INTRAMUSCULAR | Status: AC | PRN
  Administered 2019-03-21: 50 mL via INTRA_ARTERIAL

## 2019-03-21 MED ORDER — FENTANYL CITRATE (PF) 100 MCG/2ML IJ SOLN
INTRAMUSCULAR | Status: AC
  Filled 2019-03-21: qty 2

## 2019-03-21 MED ORDER — SODIUM CHLORIDE 0.9 % IV SOLN: INTRAVENOUS | Status: AC

## 2019-03-21 MED ORDER — HEPARIN SODIUM (PORCINE) 1000 UNIT/ML IJ SOLN
INTRAMUSCULAR | Status: AC | PRN
  Administered 2019-03-21 (×2): 500 [IU] via INTRAVENOUS

## 2019-03-21 MED ORDER — MIDAZOLAM HCL 2 MG/2ML IJ SOLN
INTRAMUSCULAR | Status: AC
  Filled 2019-03-21: qty 2

## 2019-03-21 MED ORDER — MIDAZOLAM HCL 2 MG/2ML IJ SOLN
INTRAMUSCULAR | Status: AC | PRN
  Administered 2019-03-21: 1 mg via INTRAVENOUS

## 2019-03-21 MED ORDER — FENTANYL CITRATE (PF) 100 MCG/2ML IJ SOLN
INTRAMUSCULAR | Status: AC | PRN
  Administered 2019-03-21: 25 ug via INTRAVENOUS

## 2019-03-21 MED ORDER — SODIUM CHLORIDE 0.9 % IV SOLN: INTRAVENOUS | Status: DC

## 2019-03-21 MED ORDER — LIDOCAINE HCL 1 % IJ SOLN
INTRAMUSCULAR | Status: AC
  Filled 2019-03-21: qty 20

## 2019-03-21 NOTE — Procedures (Signed)
S/P  Bilateral CCA and subclavian arteriograms. RT CFA approach. Findings. 1.Severe LT VBJ/prox basilar artery stenosis.Richard Reed  2.60 to 70 %stenosis of LT VA origin. S.Kyelle Urbas MD

## 2019-03-21 NOTE — Discharge Instructions (Signed)

## 2019-03-21 NOTE — H&P (Signed)
Chief Complaint: Patient was seen in consultation today for intracranial stenosis, aneurysm  Supervising Physician: [name]  Patient Status: [facility] - Out-pt  History of Present Illness: Richard Reed is a 85 y.o. male with past medical history of angina, CKD, HTN, nystagmus, and TIA who has been followed by Dr. Estanislado Pandy since at least 2017 after referral from Dr. Brock Bad office for a R MCA aneurysm.   Diagnostic angiogram in 2017 showed: High-grade pre occlusive stenosis of the left vertebrobasilar junction just distal to the left posterior-inferior cerebellar artery, and a second area of severe stenosis involving the proximal basilar artery just distal to the origin of the anterior-inferior cerebellar arteries.  Approximately 4 mm x 11 mm fusiform aneurysm involving the distal right middle cerebral artery M1 segment as described above.  Richard Reed has been followed with serial imaging and surveillance due to his lack of symptoms, however he recently reported increased dizziness and difficulty walking.   He now presents today for repeat angiogram.   His daughter accompanies him and assists with history given his profound hearing loss.  He reports left-sided headaches, dizziness, and trouble walking.  He uses a cane at baseline.  He has been NPO.   Past Medical History:  Diagnosis Date  . Angina   . Aortic valve insufficiency, moderate    a. 02/2016 Echo: mild AS, mild to mod AI.  Marland Kitchen Arthritis   . Arthritis pain   . CAD (coronary artery disease)    a. 09/2010 Cath: RCA 40-60 ost, otw nl cors;  b. 03/2016 Lexiscan MV: EF 46%, no ischemia, low risk.  . Cancer ([place])   . Carotid disease, bilateral ([place])    a. 02/2016 Carotid U/S: 1-39% bilat ICA plaquing.  . Cerebral aneurysm    a. 02/2016 MRA: fusiform bilobed aneurysm @ R MCA bifurcation.  . Cerebrovascular disease    a. 02/2016 MRA: long segment narrowing of basilar artery, moderate bilat posterior inferior  cerebellar artery dzs.  . Chronic combined systolic and diastolic CHF (congestive heart failure) ([place])    a. 02/2016 Admitted to hospital in [place] w/ CHF;  b. 02/2016 Echo ([facility]): EF 45-50%, diff HK, gr1DD, mild AS, mild to mod AI, mildly dil Ao root, PASP 62mmHg.  . CKD (chronic kidney disease), stage III ([place])   . Dysrhythmia   . Essential hypertension   . Hard of hearing   . Hemiparesis and alteration of sensations as late effects of stroke ([place]) 04/29/2015  . Hiatal hernia   . History of kidney stones   . HOH (hard of hearing)   . Hyperlipidemia   . Kidney stones   . Moderate aortic insufficiency   . NICM (nonischemic cardiomyopathy) ([place])    a. 02/2016 Echo: EF 45-50%, diff HK;  b. 03/2016 Lexiscan MV: No ischemia, EF 46%.  Marland Kitchen NSVT (nonsustained ventricular tachycardia) ([place])    a. 03/2016 noted on event monitor.  Marland Kitchen PAF (paroxysmal atrial fibrillation) ([place])    a. 2012 - noted in setting of E coli bacteremia and septic shock.  . Pneumonia 03/23/2011   "first time"  . Prostate cancer ([place])   . Sepsis due to Escherichia coli ([place]) 03/24/11  . Syncope    a. 02/2016 presumed to be 2/2 hypotension/dehydration and basilar artery dzs noted on MRA.  Marland Kitchen TIA (transient ischemic attack)   . Vertebrobasilar insufficiency 04/28/2016  . Visual disturbance 03/19/2014    Past Surgical History:  Procedure Laterality Date  . CARDIAC CATHETERIZATION  09/2010  non obstructive CAD, 40-60% ostial RCA stenosis without dampening   . CATARACT EXTRACTION Bilateral   . cerebral angiography    . CHOLECYSTECTOMY  04/15/2011   Procedure: LAPAROSCOPIC CHOLECYSTECTOMY WITH INTRAOPERATIVE CHOLANGIOGRAM;  Surgeon: Belva Crome, MD;  Location: [facility];  Service: General;  Laterality: N/A;  . EXTRACORPOREAL SHOCK WAVE LITHOTRIPSY Left 12/12/2016   Procedure: LEFT EXTRACORPOREAL SHOCK WAVE LITHOTRIPSY (ESWL);  Surgeon: Ardis Hughs, MD;  Location: [facility];  Service: Urology;  Laterality: Left;  . IR  GENERIC HISTORICAL  03/17/2016   IR ANGIO VERTEBRAL SEL SUBCLAVIAN INNOMINATE BILAT MOD SED 03/17/2016 [name], MD [facility]  . IR GENERIC HISTORICAL  03/17/2016   IR ANGIO INTRA EXTRACRAN SEL COM CAROTID INNOMINATE BILAT MOD SED 03/17/2016 [name], MD [facility]  . PROSTATE SURGERY     approx 10 years ago, seed implant radiation  . TONSILLECTOMY      Allergies: Patient has no known allergies.  Medications: Prior to Admission medications   Medication Sig Start Date End Date Taking? Authorizing Provider  B Complex-C (SUPER B COMPLEX PO) Take 1 tablet by mouth daily.   Yes [provider]  calcium carbonate (OSCAL) 1500 (600 Ca) MG TABS tablet Take 600 mg of elemental calcium by mouth daily with breakfast.   Yes [provider]  Cyanocobalamin 5000 MCG TBDP Take 5,000 mcg by mouth daily. Vitamin B12   Yes [provider]  feeding supplement, ENSURE ENLIVE, (ENSURE ENLIVE) LIQD Take 237 mLs by mouth 2 (two) times daily between meals. 03/02/16  Yes Geradine Girt, DO  Multiple Vitamins-Minerals (MULTIVITAMIN WITH MINERALS) tablet Take 1 tablet by mouth daily.   Yes [provider]  Multiple Vitamins-Minerals (PRESERVISION AREDS PO) Take 1 tablet by mouth 2 (two) times daily.    Yes [provider]  Propylene Glycol (SYSTANE BALANCE OP) Place 1 drop into both eyes 2 (two) times daily.   Yes [provider]  rosuvastatin (CRESTOR) 10 MG tablet Take 1 tablet (10 mg total) by mouth daily. 12/17/18  Yes Lorretta Harp, MD  tamsulosin (FLOMAX) 0.4 MG CAPS capsule Take 1 capsule (0.4 mg total) by mouth daily. 12/12/16  Yes Ardis Hughs, MD     Family History  Problem Relation Age of Onset  . Heart failure Mother   . Heart disease Mother   . Heart disease Father   . Cancer Brother        prostate    Social History   Socioeconomic History  . Marital status: Married    Spouse name: Not on file  .  Number of children: 2  . Years of education: HS  . Highest education level: Not on file  Occupational History  . Occupation: Retired  Scientific laboratory technician  . Financial resource strain: Not on file  . Food insecurity    Worry: Not on file    Inability: Not on file  . Transportation needs    Medical: Not on file    Non-medical: Not on file  Tobacco Use  . Smoking status: Never Smoker  . Smokeless tobacco: Never Used  Substance and Sexual Activity  . Alcohol use: No  . Drug use: No  . Sexual activity: Yes  Lifestyle  . Physical activity    Days per week: Not on file    Minutes per session: Not on file  . Stress: Not on file  Relationships  . Social connections    Talks on phone: Not on file  Gets together: Not on file    Attends religious service: Not on file    Active member of club or organization: Not on file    Attends meetings of clubs or organizations: Not on file    Relationship status: Not on file  Other Topics Concern  . Not on file  Social History Narrative   Medical Decision Maker:  OTHAR NEVILLS (wife) Cell. [phone] House: [phone].   Daughter: Nigel Sloop: Cell [phone]   Full Code   Patient is Left handed.   Patient drinks 1 cup of caffeine daily.   Patient is left handed.     Review of Systems: A 12 point ROS discussed and pertinent positives are indicated in the HPI above.  All other systems are negative.  Review of Systems  Constitutional: Negative for fatigue and fever.  Respiratory: Negative for cough and shortness of breath.   Cardiovascular: Negative for chest pain.  Gastrointestinal: Negative for abdominal pain.  Genitourinary: Negative for dysuria.  Musculoskeletal: Negative for back pain.  Neurological: Positive for dizziness, weakness, light-headedness and headaches.  Psychiatric/Behavioral: Negative for behavioral problems and confusion.    Vital Signs: BP (!) 143/95   Pulse 74   Temp 98.2 F (36.8 C)   Resp 16   Ht  5\' 7"  (1.702 m)   Wt 140 lb (63.5 kg)   SpO2 100%   BMI 21.93 kg/m   Physical Exam Vitals signs and nursing note reviewed.  Constitutional:      Appearance: Normal appearance.  Cardiovascular:     Rate and Rhythm: Normal rate and regular rhythm.     Heart sounds: No murmur.  Pulmonary:     Effort: Pulmonary effort is normal. No respiratory distress.     Breath sounds: Normal breath sounds.  Abdominal:     General: Abdomen is flat.     Palpations: Abdomen is soft.  Skin:    General: Skin is warm and dry.  Neurological:     General: No focal deficit present.     Mental Status: He is alert and oriented to person, place, and time. Mental status is at baseline.      MD Evaluation Airway: WNL Heart: WNL Abdomen: WNL Chest/ Lungs: WNL ASA  Classification: 3 Mallampati/Airway Score: One   Imaging: No results found.  Labs:  CBC: Recent Labs    12/01/18 1530  12/02/18 0637 12/02/18 1629 12/03/18 0352 03/21/19 1006  WBC 3.9*  --   --  5.0 3.5* 3.6*  HGB 14.8   < > 14.1 14.1 13.5 14.9  HCT 43.9   < > 42.6 42.2 40.3 45.6  PLT 135*  --   --  132* 126* 114*   < > = values in this interval not displayed.    COAGS: Recent Labs    03/21/19 1006  INR 1.1  APTT 32    BMP: Recent Labs    10/09/18 1617 12/01/18 1530 12/02/18 0637 03/21/19 1006  NA  --  138 137 140  K  --  4.1 4.1 4.3  CL  --  103 103 105  CO2  --  25 26 25   GLUCOSE  --  124* 94 95  BUN  --  25* 19 24*  CALCIUM  --  9.1 8.9 9.1  CREATININE 1.50* 1.40* 1.31* 1.34*  GFRNONAA  --  44* 48* 46*  GFRAA  --  51* 55* 53*    LIVER FUNCTION TESTS: Recent Labs    12/01/18 1530  BILITOT 0.9  AST 23  ALT 14  ALKPHOS 70  PROT 6.8  ALBUMIN 3.9    TUMOR MARKERS: No results for input(s): AFPTM, CEA, CA199, CHROMGRNA in the last 8760 hours.  Assessment and Plan: Patient known to NIR from previous diagnostic angiogram present for repeat diagnostic angiogram due to worsening headaches,  dizziness, and trouble walking.  Patient presents today in their usual state of health.  He has been NPO and is not currently on blood thinners.   Risks and benefits of angiogram were discussed with the patient including, but not limited to bleeding, infection, vascular injury or contrast induced renal failure.  This interventional procedure involves the use of X-rays and because of the nature of the planned procedure, it is possible that we will have prolonged use of X-ray fluoroscopy.  Potential radiation risks to you include (but are not limited to) the following: - A slightly elevated risk for cancer  several years later in life. This risk is typically less than 0.5% percent. This risk is low in comparison to the normal incidence of human cancer, which is 33% for women and 50% for men according to the [place]. - Radiation induced injury can include skin redness, resembling a rash, tissue breakdown / ulcers and hair loss (which can be temporary or permanent).   The likelihood of either of these occurring depends on the difficulty of the procedure and whether you are sensitive to radiation due to previous procedures, disease, or genetic conditions.   IF your procedure requires a prolonged use of radiation, you will be notified and given written instructions for further action.  It is your responsibility to monitor the irradiated area for the 2 weeks following the procedure and to notify your physician if you are concerned that you have suffered a radiation induced injury.    All of the patient's questions were answered, patient is agreeable to proceed.  Consent signed and in chart.  Thank you for this interesting consult.  I greatly enjoyed meeting Richard Reed and look forward to participating in their care.  A copy of this report was sent to the requesting provider on this date.  Electronically Signed: Docia Barrier, PA 03/21/2019, 11:57 AM   I spent a  total of    25 Minutes in face to face in clinical consultation, greater than 50% of which was counseling/coordinating care for left vertebrobasilar junction stenosis, R MCA aneurysm.

## 2019-03-21 NOTE — Progress Notes (Signed)
Up and walked and tolerated well; right groin stable, no bleeding or hematoma 

## 2019-03-22 ENCOUNTER — Ambulatory Visit (HOSPITAL_COMMUNITY): Payer: Medicare Other

## 2019-03-25 ENCOUNTER — Encounter (HOSPITAL_COMMUNITY): Payer: Self-pay | Admitting: Interventional Radiology

## 2019-03-25 DIAGNOSIS — L0291 Cutaneous abscess, unspecified: Secondary | ICD-10-CM | POA: Diagnosis not present

## 2019-03-26 ENCOUNTER — Encounter (HOSPITAL_COMMUNITY): Payer: Self-pay

## 2019-06-26 DIAGNOSIS — Z0000 Encounter for general adult medical examination without abnormal findings: Secondary | ICD-10-CM | POA: Diagnosis not present

## 2019-06-26 DIAGNOSIS — E7800 Pure hypercholesterolemia, unspecified: Secondary | ICD-10-CM | POA: Diagnosis not present

## 2019-06-26 DIAGNOSIS — I5032 Chronic diastolic (congestive) heart failure: Secondary | ICD-10-CM | POA: Diagnosis not present

## 2019-06-26 DIAGNOSIS — I2510 Atherosclerotic heart disease of native coronary artery without angina pectoris: Secondary | ICD-10-CM | POA: Diagnosis not present

## 2019-06-27 DIAGNOSIS — N200 Calculus of kidney: Secondary | ICD-10-CM | POA: Diagnosis not present

## 2019-06-27 DIAGNOSIS — Z8546 Personal history of malignant neoplasm of prostate: Secondary | ICD-10-CM | POA: Diagnosis not present

## 2019-07-11 ENCOUNTER — Telehealth: Payer: Self-pay | Admitting: Student

## 2019-07-11 DIAGNOSIS — R0602 Shortness of breath: Secondary | ICD-10-CM | POA: Diagnosis not present

## 2019-07-11 NOTE — Telephone Encounter (Signed)
   Pt's daughter would like to come in with pt on his appt on 07/15/19. She said pt is hard of hearing and needs assistance walking.   Please advise

## 2019-07-11 NOTE — Telephone Encounter (Signed)
Spoke with patient's daughter. Virtual visit changed to in office visit for 07/12/19.

## 2019-07-11 NOTE — Progress Notes (Signed)
Cardiology Office Note:    Date:  07/12/2019   ID:  Richard Reed, DOB February 20, 1926, MRN [id number]  PCP:  [name], FNP  Cardiologist:  Quay Burow, MD   Referring MD: [name], FNP   Chief Complaint  Patient presents with  . Follow-up    dyspnea    History of Present Illness:    Richard Reed is a 86 y.o. male with a hx of  PAF, low-normal EF, aortic insufficiency, nonobstructive CAD, HLD, diet-controlled hypertension, remote PAF in the setting of sepsis without recurrence.  Heart cath 09/2010 with nonobstructive disease (40-50% ostial RCA stenosis).  Echo in 02/2016 with EF of 45-50% and grade 1 DD. He was started on lasix after a hospitalization in [place] for CHF exacerbation due to dietary indiscretion. Given his age and lack of symptoms, Dr. [name] has stopped serial echos to follow his AI. He does have 60-70% stenosis in the left vertebral artery found on angiogram 03/21/2019 - started on ASA with instructions to avoid dehydration.   He called our office complaining of shortness of breath and was placed on my schedule. He is currently not on a diuretic regimen. He presents with his daughter who helps with history. He states he became short of breath with orthopnea two days ago. He is unable to lay down in bed, had to sleep in the recliner for the past two nights. He also vomited up his breakfast this morning. No recent illness, no fever. He did have lower extremity swelling yesterday, which seems to have resolved after elevating his feet yesterday and last night. He denies changes in diet. Unclear what has precipitated this exacerbation.   O2 sat was normal at rest and when I walked him around the office - 95%.   Past Medical History:  Diagnosis Date  . Angina   . Aortic valve insufficiency, moderate    a. 02/2016 Echo: mild AS, mild to mod AI.  Marland Kitchen Arthritis   . Arthritis pain   . CAD (coronary artery disease)    a. 09/2010 Cath: RCA 40-60 ost, otw nl cors;  b.  03/2016 Lexiscan MV: EF 46%, no ischemia, low risk.  . Cancer ([place])   . Carotid disease, bilateral ([place])    a. 02/2016 Carotid U/S: 1-39% bilat ICA plaquing.  . Cerebral aneurysm    a. 02/2016 MRA: fusiform bilobed aneurysm @ R MCA bifurcation.  . Cerebrovascular disease    a. 02/2016 MRA: long segment narrowing of basilar artery, moderate bilat posterior inferior cerebellar artery dzs.  . Chronic combined systolic and diastolic CHF (congestive heart failure) ([place])    a. 02/2016 Admitted to hospital in [place] w/ CHF;  b. 02/2016 Echo ([facility]): EF 45-50%, diff HK, gr1DD, mild AS, mild to mod AI, mildly dil Ao root, PASP 59mmHg.  . CKD (chronic kidney disease), stage III   . Dysrhythmia   . Essential hypertension   . Hard of hearing   . Hemiparesis and alteration of sensations as late effects of stroke ([place]) 04/29/2015  . Hiatal hernia   . History of kidney stones   . HOH (hard of hearing)   . Hyperlipidemia   . Kidney stones   . Moderate aortic insufficiency   . NICM (nonischemic cardiomyopathy) ([place])    a. 02/2016 Echo: EF 45-50%, diff HK;  b. 03/2016 Lexiscan MV: No ischemia, EF 46%.  Marland Kitchen NSVT (nonsustained ventricular tachycardia) ([place])    a. 03/2016 noted on event monitor.  Marland Kitchen PAF (paroxysmal atrial  fibrillation) ([place])    a. 2012 - noted in setting of E coli bacteremia and septic shock.  . Pneumonia 03/23/2011   "first time"  . Prostate cancer ([place])   . Sepsis due to Escherichia coli ([place]) 03/24/11  . Syncope    a. 02/2016 presumed to be 2/2 hypotension/dehydration and basilar artery dzs noted on MRA.  Marland Kitchen TIA (transient ischemic attack)   . Vertebrobasilar insufficiency 04/28/2016  . Visual disturbance 03/19/2014    Past Surgical History:  Procedure Laterality Date  . CARDIAC CATHETERIZATION  09/2010   non obstructive CAD, 40-60% ostial RCA stenosis without dampening   . CATARACT EXTRACTION Bilateral   . cerebral angiography    . CHOLECYSTECTOMY  04/15/2011   Procedure:  LAPAROSCOPIC CHOLECYSTECTOMY WITH INTRAOPERATIVE CHOLANGIOGRAM;  Surgeon: Belva Crome, MD;  Location: [facility];  Service: General;  Laterality: N/A;  . EXTRACORPOREAL SHOCK WAVE LITHOTRIPSY Left 12/12/2016   Procedure: LEFT EXTRACORPOREAL SHOCK WAVE LITHOTRIPSY (ESWL);  Surgeon: Ardis Hughs, MD;  Location: [facility];  Service: Urology;  Laterality: Left;  . IR ANGIO INTRA EXTRACRAN SEL COM CAROTID INNOMINATE BILAT MOD SED  03/21/2019  . IR ANGIO VERTEBRAL SEL SUBCLAVIAN INNOMINATE BILAT MOD SED  03/21/2019  . IR GENERIC HISTORICAL  03/17/2016   IR ANGIO VERTEBRAL SEL SUBCLAVIAN INNOMINATE BILAT MOD SED 03/17/2016 [name], MD [facility]  . IR GENERIC HISTORICAL  03/17/2016   IR ANGIO INTRA EXTRACRAN SEL COM CAROTID INNOMINATE BILAT MOD SED 03/17/2016 [name], MD [facility]  . PROSTATE SURGERY     approx 10 years ago, seed implant radiation  . TONSILLECTOMY      Current Medications: Current Meds  Medication Sig  . B Complex-C (SUPER B COMPLEX PO) Take 1 tablet by mouth daily.  . calcium carbonate (OSCAL) 1500 (600 Ca) MG TABS tablet Take 600 mg of elemental calcium by mouth daily with breakfast.  . Cyanocobalamin 5000 MCG TBDP Take 5,000 mcg by mouth daily. Vitamin B12  . feeding supplement, ENSURE ENLIVE, (ENSURE ENLIVE) LIQD Take 237 mLs by mouth 2 (two) times daily between meals.  . Multiple Vitamins-Minerals (MULTIVITAMIN WITH MINERALS) tablet Take 1 tablet by mouth daily.  Marland Kitchen Propylene Glycol (SYSTANE BALANCE OP) Place 1 drop into both eyes 2 (two) times daily.  . rosuvastatin (CRESTOR) 10 MG tablet Take 1 tablet (10 mg total) by mouth daily.  . tamsulosin (FLOMAX) 0.4 MG CAPS capsule Take 1 capsule (0.4 mg total) by mouth daily.     Allergies:   Patient has no known allergies.   Social History   Socioeconomic History  . Marital status: Married    Spouse name: Not on file  . Number of children: 2  . Years of education: HS  . Highest education  level: Not on file  Occupational History  . Occupation: Retired  Tobacco Use  . Smoking status: Never Smoker  . Smokeless tobacco: Never Used  Substance and Sexual Activity  . Alcohol use: No  . Drug use: No  . Sexual activity: Yes  Other Topics Concern  . Not on file  Social History Narrative   Medical Decision Maker:  MAMOUN SPEARMAN (wife) Cell. [phone] House: [phone].   Daughter: Nigel Sloop: Cell [phone]   Full Code   Patient is Left handed.   Patient drinks 1 cup of caffeine daily.   Patient is left handed.   Social Determinants of Health   Financial Resource Strain:   . Difficulty of Paying Living Expenses:  Food Insecurity:   . Worried About Charity fundraiser in the Last Year:   . Arboriculturist in the Last Year:   Transportation Needs:   . Film/video editor (Medical):   Marland Kitchen Lack of Transportation (Non-Medical):   Physical Activity:   . Days of Exercise per Week:   . Minutes of Exercise per Session:   Stress:   . Feeling of Stress :   Social Connections:   . Frequency of Communication with Friends and Family:   . Frequency of Social Gatherings with Friends and Family:   . Attends Religious Services:   . Active Member of Clubs or Organizations:   . Attends Archivist Meetings:   Marland Kitchen Marital Status:      Family History: The patient's family history includes Cancer in his brother; Heart disease in his father and mother; Heart failure in his mother.  ROS:   Please see the history of present illness.     All other systems reviewed and are negative.  EKGs/Labs/Other Studies Reviewed:    The following studies were reviewed today:  Echo 2017: Study Conclusions   - Left ventricle: The cavity size was normal. Wall thickness was  normal. Systolic function was mildly reduced. The estimated  ejection fraction was in the range of 45% to 50%. Diffuse  hypokinesis. Doppler parameters are consistent with abnormal left    ventricular relaxation (grade 1 diastolic dysfunction). Doppler  parameters are consistent with high ventricular filling pressure.  - Aortic valve: Valve mobility was restricted. There was mild  stenosis. There was mild to moderate regurgitation. Valve area  (VTI): 1.54 cm^2. Valve area (Vmax): 1.42 cm^2. Valve area  (Vmean): 1.53 cm^2.  - Aortic root: The aortic root was mildly dilated.  - Mitral valve: Calcified annulus.  - Pulmonary arteries: Systolic pressure was mildly increased. PA  peak pressure: 38 mm Hg (S).   Impressions:   - Mild global reduction in LV function (EF 50); grade 1 diastolic  dysfunction with elevated LV filling pressure; calcified aortic  valve with mild AS and mild to moderate AI; mild TR with mildly  elevated pulmonary pressure.   EKG:  EKG is not ordered today.    Recent Labs: 12/01/2018: ALT 14; Magnesium 2.2 03/21/2019: BUN 24; Creatinine, Ser 1.34; Hemoglobin 14.9; Platelets 114; Potassium 4.3; Sodium 140  Recent Lipid Panel    Component Value Date/Time   CHOL 97 (L) 05/22/2017 1056   TRIG 66 05/22/2017 1056   HDL 39 (L) 05/22/2017 1056   CHOLHDL 2.5 05/22/2017 1056   CHOLHDL 2.5 04/25/2016 0813   VLDL 13 04/25/2016 0813   LDLCALC 45 05/22/2017 1056    Physical Exam:    VS:  BP (!) 146/72 (BP Location: Left Arm, Patient Position: Sitting, Cuff Size: Normal)   Ht 5\' 8"  (1.727 m)   Wt 141 lb 9.6 oz (64.2 kg)   BMI 21.53 kg/m     Wt Readings from Last 3 Encounters:  07/12/19 141 lb 9.6 oz (64.2 kg)  03/21/19 140 lb (63.5 kg)  12/05/18 141 lb (64 kg)     GEN: elderly gentleman, very HOH,  in no acute distress HEENT: Normal NECK: No JVD; No carotid bruits LYMPHATICS: No lymphadenopathy CARDIAC: RRR, no murmurs, rubs, gallops RESPIRATORY:  Clear to auscultation without rales, wheezing or rhonchi  ABDOMEN: Soft, non-tender, non-distended MUSCULOSKELETAL:  No edema; No deformity  SKIN: Warm and dry NEUROLOGIC:  Alert  and oriented x 3 PSYCHIATRIC:  Normal affect  ASSESSMENT:    1. Acute on chronic combined systolic and diastolic CHF (congestive heart failure) ([place])   2. Shortness of breath   3. Orthopnea   4. Nonrheumatic aortic valve insufficiency   5. Coronary artery disease due to lipid rich plaque   6. PAF (paroxysmal atrial fibrillation) ([facility])   7. Vertebrobasilar insufficiency    PLAN:    In order of problems listed above:  Chronic systolic and diastolic heart failure Dyspnea Orthopnea - EF 45-50% with grade 1 DD This is a difficult situation given his vertebral artery stenosis, CKD, and likely CHF exacerbation. He needs to stay fairly hydrated to avoid dizziness and syncope. However, I suspect he is mildly volume overloaded by history - dyspnea at rest, orthopnea, lower extremity swelling yesterday. Fortunately, his lungs sound clear on exam today and I do not appreciate significant JVD. I suspect given his age and comorbidities that he does not have a lot of reserves - will not tolerate a lot of extra fluid but will also not tolerate dehydration. I discussed attempting to treat this outpatient with low dose lasix; but if his breathing worsens, they will present to the ER. I agree with close follow up on Monday.  - start 20 mg lasix - may be too low given his CKD, but this is a good starting place - will collect CBC, BMP, BNP - will also obtain a CXR to rule out PNA - he vomited this morning, which may indicate underlying illness - will obtain echocardiogram next week  He plans to go to [facility] [place] in two weeks. We will continue to follow.    Aortic insufficiency  - per Dr. [name], we will not continue serial echos given his age and lack of symptoms - avoid dehydration   Vertebral artery stenosis - 60-70% stenosis in the left vertebral artery - avoid dehydration   CAD - Nonobstructive by heart cath - no chest pain - do not suspect an ACS process at this time   Hyperlipidemia 06/2018:  total cholesterol 106, LDL 50, HDL 46 Continue crestor   PAF - history of PAF in the setting of sepsis, no recurrence - no anticoagulation   Medication Adjustments/Labs and Tests Ordered: Current medicines are reviewed at length with the patient today.  Concerns regarding medicines are outlined above.  Orders Placed This Encounter  Procedures  . DG Chest 2 View  . CBC  . Basic metabolic panel  . Brain natriuretic peptide  . ECHOCARDIOGRAM COMPLETE   Meds ordered this encounter  Medications  . furosemide (LASIX) 20 MG tablet    Sig: Take 1 tablet (20 mg total) by mouth daily.    Dispense:  30 tablet    Refill:  0    Signed, Ledora Bottcher, [place]  07/12/2019 12:24 PM    [facility]

## 2019-07-11 NOTE — Telephone Encounter (Signed)
Spoke with patient's daughter. Patient was advised by PCP to be seen as soon as possible virtually by cardiology to evaluate shortness of breath. Patient advised by PCP not to lay flat. PCP concerned patient may have fluid buildup per daughter. Patient placed on Dara Hoyer schedule for a virtual Visit 3/26 at 11:15am.

## 2019-07-12 ENCOUNTER — Ambulatory Visit
Admission: RE | Admit: 2019-07-12 | Discharge: 2019-07-12 | Disposition: A | Discharge status: Home / Self Care | Payer: Medicare Other | Source: Ambulatory Visit | Attending: Physician Assistant | Admitting: Physician Assistant

## 2019-07-12 ENCOUNTER — Other Ambulatory Visit: Payer: Self-pay

## 2019-07-12 ENCOUNTER — Ambulatory Visit: Payer: Medicare Other | Admitting: Physician Assistant

## 2019-07-12 ENCOUNTER — Encounter: Payer: Self-pay | Admitting: Physician Assistant

## 2019-07-12 VITALS — BP 146/72 | Ht 68.0 in | Wt 141.6 lb

## 2019-07-12 DIAGNOSIS — I480 Paroxysmal atrial fibrillation: Secondary | ICD-10-CM

## 2019-07-12 DIAGNOSIS — I351 Nonrheumatic aortic (valve) insufficiency: Secondary | ICD-10-CM

## 2019-07-12 DIAGNOSIS — I5043 Acute on chronic combined systolic (congestive) and diastolic (congestive) heart failure: Secondary | ICD-10-CM

## 2019-07-12 DIAGNOSIS — R0601 Orthopnea: Secondary | ICD-10-CM | POA: Diagnosis not present

## 2019-07-12 DIAGNOSIS — R0602 Shortness of breath: Secondary | ICD-10-CM | POA: Diagnosis not present

## 2019-07-12 DIAGNOSIS — I2583 Coronary atherosclerosis due to lipid rich plaque: Secondary | ICD-10-CM

## 2019-07-12 DIAGNOSIS — G450 Vertebro-basilar artery syndrome: Secondary | ICD-10-CM

## 2019-07-12 DIAGNOSIS — I2510 Atherosclerotic heart disease of native coronary artery without angina pectoris: Secondary | ICD-10-CM

## 2019-07-12 MED ORDER — FUROSEMIDE 20 MG PO TABS: 20.0000 mg | ORAL_TABLET | Freq: Every day | ORAL | 0 refills | Status: DC

## 2019-07-12 NOTE — Patient Instructions (Signed)
Medication Instructions:  START LASIX 20MG  DAILY *If you need a refill on your cardiac medications before your next appointment, please call your pharmacy*  Lab Work: BMET, BNP AND CBC TODAY HERE IN OUR OFFICE [facility] If you have labs (blood work) drawn today and your tests are completely normal, you will receive your results only by:  [place] (if you have MyChart) OR A paper copy in the mail.  If you have any lab test that is abnormal or we need to change your treatment, we will call you to review the results.  Testing/Procedures: Echocardiogram NEXT WEEK - Your physician has requested that you have an echocardiogram. Echocardiography is a painless test that uses sound waves to create images of your heart. It provides your doctor with information about the size and shape of your heart and how well your heart's chambers and valves are working. This procedure takes approximately one hour. There are no restrictions for this procedure. This will be performed at our [facility] location - [address], [address].  Chest xray - Your physician has requested that you have a chest xray, is a fast and painless imaging test that uses certain electromagnetic waves to create pictures of the structures in and around your chest. This test can help diagnose and monitor conditions such as pneumonia and other lung issues This will be done at [address], [place]. If you should need to call them their phone number is [phone].  Follow-Up: Your next appointment:  [place]   In Person with Sande Rives, PA-C  At [facility], you and your health needs are our priority.  As part of our continuing mission to provide you with exceptional heart care, we have created designated Provider Care Teams.  These Care Teams include your primary Cardiologist (physician) and Advanced Practice Providers (APPs -  Physician Assistants and Nurse Practitioners) who all  work together to provide you with the care you need, when you need it.  We recommend signing up for the patient portal called "MyChart".  Sign up information is provided on this After Visit Summary.  MyChart is used to connect with patients for Virtual Visits (Telemedicine).  Patients are able to view lab/test results, encounter notes, upcoming appointments, etc.  Non-urgent messages can be sent to your provider as well.   To learn more about what you can do with MyChart, go to [email or web address].

## 2019-07-12 NOTE — Progress Notes (Signed)
Cardiology Office Note:    Date:  07/15/2019   ID:  [name], DOB 1925/05/07, MRN [id number]  PCP:  [name], FNP  Cardiologist:  Quay Burow, MD  Electrophysiologist:  None   Referring MD: [name], FNP   Chief Complaint: follow-up of shortness of breath  History of Present Illness:    FARSHAD WOLINSKI is a 86 y.o. male with a history of non-obstructive CAD on cardiac catheterization in 2012, chronic combined CHF with EF of 45-50% on Echo in 02/2016, paroxysmal atrial fibrillation in 2012 in setting of bacteremia and septic shock without recurrence, aortic insufficiency, TIA, cerebral aneurysm, mild carotid artery disease, hypertension, hyperlipidemia, and CKD stage III who is followed by Dr. [name] and presents today for follow-up of shortness of breath.   Cardiac catheterization in 09/2010 showed non-obstructive CAD with 40-60% stenosis of ostial RCA with otherwise normal coronaries. He was admitted to the hospital in [place] in 02/2016 with CHF exacerbation felt to be due to dietary indiscretion and was started diuretics. Soon after returning to [facility], he was admitted following syncopal episode. CT head was negative for acute stroke. MRI and MRA showed diffuse cerebrovascular disease with long segment basilar arterial disease and also a right MCA fusiform aneurysm.Carotid ultrasound showed 1-39% bilateral internal carotid stenosis. Echo showed  LVEF of 45-50% with diffuse hypokinesis, grade 1 diastolic dysfunction, mild stenosis, mild to moderate regurgitation, mildly dilated aortic root, and mildly increased PASP of 38 mmHg. It was ultimately felt that syncopal episode was likely due to dehydration, relative hypotensive, and bradycardia in the setting of vertebrobasilar disease. At follow-up visit with Cardiology, Leane Call was ordered given reduced EF and a 30-day Event Monitor was ordered for further evaluation of syncope. Lexiscan Myoview in 03/2016 was low risk with  no ischemia. Event monitor showed PVCs and short runs of non-sustained VT.   Patient recently had repeat cerebral angiogram in 03/2019 due to increased dizziness and difficulty walking. Angiogram showed high-grade stenosis of the left vertebrobasilar junction and of the proximal basilar artery just proximal to the origin of the anterior-inferior cerebral artery. Also noted 60-70% stenosis of the origin of the left vertebral artery. He was started on Aspirin 81mg  daily and advised to maintain adequate hydration and avoid hypotension.   He was recently seen Doreene Adas, PA-C, in our office on 07/12/2019 for evaluation of shortness of breath and orthopnea for the past 2 days as well as some lower extremity edema. He also had vomited earlier that morning but denied any recent fevers or illnesses. He was felt to be mildly volume overloaded. However, difficult to diuresis due to need to avoid dehydration given vertebral artery stenosis. He was started on Lasix 20mg  daily. CBC, BMP, and BNP were ordered. BNP elevated at 525. Creatinine stable around 1.3 and otherwise labs unremarkable. Chest x-ray was also ordered which showed no acute findings. He was advised to return today for close follow-up.   Patient here today with daughter [name]. Shortness of breath and orthopnea have improved some but he is still sleeping in a recliner. He was able to do upper and lower body exercises without any problems. Weight stable from last visit. Some lower extremity edema but stable.  No chest pain although he occasionally feels like something gets stuck in the middle of his chest. He does have a history of a hiatal hernia. No lightheadedness, dizziness, or near syncope.   Past Medical History:  Diagnosis Date   Angina  Aortic valve insufficiency, moderate    a. 02/2016 Echo: mild AS, mild to mod AI.   Arthritis    Arthritis pain    CAD (coronary artery disease)    a. 09/2010 Cath: RCA 40-60 ost, otw nl cors;  b.  03/2016 Lexiscan MV: EF 46%, no ischemia, low risk.   Cancer [facility])    Carotid disease, bilateral ([place])    a. 02/2016 Carotid U/S: 1-39% bilat ICA plaquing.   Cerebral aneurysm    a. 02/2016 MRA: fusiform bilobed aneurysm @ R MCA bifurcation.   Cerebrovascular disease    a. 02/2016 MRA: long segment narrowing of basilar artery, moderate bilat posterior inferior cerebellar artery dzs.   Chronic combined systolic and diastolic CHF (congestive heart failure) ([place])    a. 02/2016 Admitted to hospital in [place] w/ CHF;  b. 02/2016 Echo ([facility]): EF 45-50%, diff HK, gr1DD, mild AS, mild to mod AI, mildly dil Ao root, PASP 62mmHg.   CKD (chronic kidney disease), stage III    Dysrhythmia    Essential hypertension    Hard of hearing    Hemiparesis and alteration of sensations as late effects of stroke ([place]) 04/29/2015   Hiatal hernia    History of kidney stones    HOH (hard of hearing)    Hyperlipidemia    Kidney stones    Moderate aortic insufficiency    NICM (nonischemic cardiomyopathy) ([place])    a. 02/2016 Echo: EF 45-50%, diff HK;  b. 03/2016 Lexiscan MV: No ischemia, EF 46%.   NSVT (nonsustained ventricular tachycardia) ([place])    a. 03/2016 noted on event monitor.   PAF (paroxysmal atrial fibrillation) ([place])    a. 2012 - noted in setting of E coli bacteremia and septic shock.   Pneumonia 03/23/2011   "first time"   Prostate cancer ([place])    Sepsis due to Escherichia coli ([place]) 03/24/11   Syncope    a. 02/2016 presumed to be 2/2 hypotension/dehydration and basilar artery dzs noted on MRA.   TIA (transient ischemic attack)    Vertebrobasilar insufficiency 04/28/2016   Visual disturbance 03/19/2014    Past Surgical History:  Procedure Laterality Date   CARDIAC CATHETERIZATION  09/2010   non obstructive CAD, 40-60% ostial RCA stenosis without dampening    CATARACT EXTRACTION Bilateral    cerebral angiography     CHOLECYSTECTOMY  04/15/2011   Procedure:  LAPAROSCOPIC CHOLECYSTECTOMY WITH INTRAOPERATIVE CHOLANGIOGRAM;  Surgeon: Belva Crome, MD;  Location: [facility];  Service: General;  Laterality: N/A;   EXTRACORPOREAL SHOCK WAVE LITHOTRIPSY Left 12/12/2016   Procedure: LEFT EXTRACORPOREAL SHOCK WAVE LITHOTRIPSY (ESWL);  Surgeon: Ardis Hughs, MD;  Location: [facility];  Service: Urology;  Laterality: Left;   IR ANGIO INTRA EXTRACRAN SEL COM CAROTID INNOMINATE BILAT MOD SED  03/21/2019   IR ANGIO VERTEBRAL SEL SUBCLAVIAN INNOMINATE BILAT MOD SED  03/21/2019   IR GENERIC HISTORICAL  03/17/2016   IR ANGIO VERTEBRAL SEL SUBCLAVIAN INNOMINATE BILAT MOD SED 03/17/2016 [name], MD [facility]   IR GENERIC HISTORICAL  03/17/2016   IR ANGIO INTRA EXTRACRAN SEL COM CAROTID INNOMINATE BILAT MOD SED 03/17/2016 [name], MD [facility]   PROSTATE SURGERY     approx 10 years ago, seed implant radiation   TONSILLECTOMY      Current Medications: Current Meds  Medication Sig   aspirin EC 81 MG tablet Take 81 mg by mouth daily.     Allergies:   Patient has no known allergies.   Social History  Socioeconomic History   Marital status: Married    Spouse name: Not on file   Number of children: 2   Years of education: HS   Highest education level: Not on file  Occupational History   Occupation: Retired  Tobacco Use   Smoking status: Never Smoker   Smokeless tobacco: Never Used  Substance and Sexual Activity   Alcohol use: No   Drug use: No   Sexual activity: Yes  Other Topics Concern   Not on file  Social History Narrative   Medical Decision Maker:  TYJAY NYBORG (wife) Cell. [phone] House: [phone].   Daughter: Nigel Sloop: Cell [phone]   Full Code   Patient is Left handed.   Patient drinks 1 cup of caffeine daily.   Patient is left handed.   Social Determinants of Health   Financial Resource Strain:    Difficulty of Paying Living Expenses:   Food Insecurity:     Worried About Charity fundraiser in the Last Year:    Arboriculturist in the Last Year:   Transportation Needs:    Film/video editor (Medical):    Lack of Transportation (Non-Medical):   Physical Activity:    Days of Exercise per Week:    Minutes of Exercise per Session:   Stress:    Feeling of Stress :   Social Connections:    Frequency of Communication with Friends and Family:    Frequency of Social Gatherings with Friends and Family:    Attends Religious Services:    Active Member of Clubs or Organizations:    Attends Archivist Meetings:    Marital Status:      Family History: The patient's family history includes Cancer in his brother; Heart disease in his father and mother; Heart failure in his mother.  ROS:   Please see the history of present illness.     EKGs/Labs/Other Studies Reviewed:    The following studies were reviewed today:  Echocardiogram 03/01/2016: Study Conclusions: - Left ventricle: The cavity size was normal. Wall thickness was  normal. Systolic function was mildly reduced. The estimated  ejection fraction was in the range of 45% to 50%. Diffuse  hypokinesis. Doppler parameters are consistent with abnormal left  ventricular relaxation (grade 1 diastolic dysfunction). Doppler  parameters are consistent with high ventricular filling pressure.  - Aortic valve: Valve mobility was restricted. There was mild  stenosis. There was mild to moderate regurgitation. Valve area  (VTI): 1.54 cm^2. Valve area (Vmax): 1.42 cm^2. Valve area  (Vmean): 1.53 cm^2.  - Aortic root: The aortic root was mildly dilated.  - Mitral valve: Calcified annulus.  - Pulmonary arteries: Systolic pressure was mildly increased. PA  peak pressure: 38 mm Hg (S).   Impressions: - Mild global reduction in LV function (EF 50); grade 1 diastolic  dysfunction with elevated LV filling pressure; calcified aortic  valve with mild AS and mild  to moderate AI; mild TR with mildly  elevated pulmonary pressure.  _______________  Event Monitor 03/08/2016 to 04/06/2016: NSR with PVCs and NSVT. _______________  Leane Call 03/30/2016:  The left ventricular ejection fraction is mildly decreased (45-54%).  Nuclear stress EF: 46%.  There was no ST segment deviation noted during stress.  This is a low risk study.   Normal perfusion EF calculated at 46% but no discrete RWMA;s May be due to frequent ventricular ectopy _______________  Carotid Ultrasounds 10/12/2016: Summary:  Bilateral - 1% to 39% ICA stenosis. Vertebral artery  flow is  Antegrade. _______________  Vertebral Angiogram 03/21/2019: Impressions: - Probable high-grade stenosis of the left vertebrobasilar junction,  and also of the proximal basilar artery just proximal to the origin  of the anterior-inferior cerebellar artery.  - Approximately 60-70% stenosis of the origin of the left vertebral  artery.  - Stable fusiform aneurysm of the distal right middle cerebral artery  extending into the inferior division as mentioned above. Stable  fusiform prominence of the left internal carotid artery proximal  cavernous segment.    Plan: - Findings were reviewed with the patient and the patient's daughter.  - It was felt the patient could benefit from low-dose aspirin 81 mg a  day, given the above angiographic findings.  - Should the patient become symptomatic despite being on aspirin,  further aggressive medical management could be considered.  - The patient was advised to maintain adequate hydration and avoid  hypotension.  - The patient and the daughter expressed understanding and agreement  to proceed with the above management plan.  - Follow-up CT angiogram of the head and neck in 6 months time.    EKG:  EKG not ordered today.    Recent Labs: 12/01/2018: ALT 14; Magnesium 2.2 07/12/2019: BNP 525.6; BUN 23; Creatinine, Ser 1.35; Hemoglobin 15.0;  Platelets 122; Potassium 4.6; Sodium 139  Recent Lipid Panel    Component Value Date/Time   CHOL 97 (L) 05/22/2017 1056   TRIG 66 05/22/2017 1056   HDL 39 (L) 05/22/2017 1056   CHOLHDL 2.5 05/22/2017 1056   CHOLHDL 2.5 04/25/2016 0813   VLDL 13 04/25/2016 0813   LDLCALC 45 05/22/2017 1056    Physical Exam:    Vital Signs: BP 118/68    Pulse (!) 55    Ht 5\' 8"  (1.727 m)    Wt 141 lb 9.6 oz (64.2 kg)    SpO2 97%    BMI 21.53 kg/m     Wt Readings from Last 3 Encounters:  07/15/19 141 lb 9.6 oz (64.2 kg)  07/12/19 141 lb 9.6 oz (64.2 kg)  03/21/19 140 lb (63.5 kg)     General: 86 y.o. male in no acute distress. Very hard of hearing. HEENT: Normocephalic and atraumatic. Sclera clear. EOMs intact. Neck: Supple. No carotid bruits. No JVD. Heart: Mildly bradycardic with regular rhythm. Distinct S1 and S2.  II-III/VI murmur noted both at upper sternal border and apex. No gallops or rubs. Radial pulses 2+ and equal bilaterally. Lungs: No increased work of breathing. Clear to ausculation bilaterally. No wheezes, rhonchi, or rales.  Abdomen: Soft, non-distended, and non-tender to palpation.  Extremities: 1+ pitting edema of bilateral ankles.  Skin: Warm and dry. Neuro: No focal deficits. Psych: Normal affect. Responds appropriately.  Assessment:    1. Chronic combined systolic and diastolic CHF (congestive heart failure) ([place])   2. Coronary artery disease involving native coronary artery of native heart without angina pectoris   3. Aortic valve insufficiency, etiology of cardiac valve disease unspecified   4. PAF (paroxysmal atrial fibrillation) ([facility])   5. Stenosis of left vertebral artery   6. Mixed hyperlipidemia     Plan:    Chronic Combined CHF - Patient seen for shortness of breath and orthopnea on 3/26 and here for close follow-up. - BNP elevated in 500's. - Chest x-ray showed stable pleural plaques but no acute findings. No overt edema.  - Most recent Echo from 02/2016  showed LVEF of 45-50% with diffuse hypokinesis and grade 1 diastolic dysfunction. Scheduled for repeat  Echo tomorrow.  - Weight same as last visit but patient feels symptomatically better with addition of low dose  Lasix. - Will continue Lasix 20mg  daily for now. However, BP is lower today (118/68) compared to last visit (146/72) and want to be for careful to avoid hypotension given vertebral artery disease. Advised patient to let us know if systolic BP drops to [id number] range or patient develops any lightheadedness/dizziness or signs of stroke. - Will check BMET today.  - Patient is planning to go to [place] for 2 weeks next week. Will arrange for one more follow-up in 1 week to make sure patient is stable for this and make sure BP is OK. Discussed that I still have low threshold for patient to go to the ED if develops worsening shortness of breath or symptoms of hypotension and family agreed.   CAD - Cardiac cath in 2012 showed non-obstructive CAD. Lexiscan in 2017 was low risk with no evidence of ischemia.  - No chest pain.  - Do not suspect ACS process at this time.  - Continue aspirin and statin.   Aortic Insufficiency - Last Echo in 2017 showed moderate aortic insufficiency.  - Per Dr. Kennon Holter last note, no plans to continue serial Echos given age and lack of symptoms.  - However, given recent dyspnea, repeat Echo was ordered and is planned for tomorrow. - Continue to avoid dehydration.   Paroxysmal Atrial Fibrillation - Isolated incidence of atrial fibrillation in setting of sepsis in 2012. No recurrence. - Not on anticoagulation.   Vertebral Artery Stenosis  - Cerebral Venogram in 03/2019 showed high-grade stenosis of the left vertebrobasilar junction and of the proximal basilar artery just proximal to the origin of the anterior-inferior cerebral artery. Also noted 60-70% stenosis of the origin of the left vertebral artery. - Continue aspirin and statin.  - Avoid dehydration.     Hyperlipidemia - Lipid panel from 06/26/2019 [facility]): Total Cholesterol 107, Triglycerides 56, HDL 46, LDL 48.  - Continue Crestor 10mg  daily.   Disposition: Follow up in 1 week.    Medication Adjustments/Labs and Tests Ordered: Current medicines are reviewed at length with the patient today.  Concerns regarding medicines are outlined above.  Orders Placed This Encounter  Procedures   Basic metabolic panel   No orders of the defined types were placed in this encounter.   Patient Instructions  Medication Instructions:  CONTINUE WITH CURRENT MEDICATIONS. NO CHANGES.  *If you need a refill on your cardiac medications before your next appointment, please call your pharmacy*   Lab Work: TODAY: BMET If you have labs (blood work) drawn today and your tests are completely normal, you will receive your results only by:  [place] (if you have MyChart) OR  A paper copy in the mail If you have any lab test that is abnormal or we need to change your treatment, we will call you to review the results.   Testing/Procedures: ECHOCARDIOGRAM SCHEDULED FOR TOMORROW AT 10:00 AM AT [place]: At [facility], you and your health needs are our priority.  As part of our continuing mission to provide you with exceptional heart care, we have created designated Provider Care Teams.  These Care Teams include your primary Cardiologist (physician) and Advanced Practice Providers (APPs -  Physician Assistants and Nurse Practitioners) who all work together to provide you with the care you need, when you need it.  We recommend signing up for the patient portal called "MyChart".  Sign up information is  provided on this After Visit Summary.  MyChart is used to connect with patients for Virtual Visits (Telemedicine).  Patients are able to view lab/test results, encounter notes, upcoming appointments, etc.  Non-urgent messages can be sent to your provider as well.   To learn more about what  you can do with MyChart, go to [email or web address].    Your next appointment:   WITH Jory Sims NP ON 07/22/19 AT 3:15PM    Signed, Darreld Mclean, PA-C  07/15/2019 1:03 PM    [facility]

## 2019-07-13 LAB — BASIC METABOLIC PANEL
BUN/Creatinine Ratio: 17 (ref 10–24)
BUN: 23 mg/dL (ref 10–36)
CO2: 22 mmol/L (ref 20–29)
Calcium: 9.2 mg/dL (ref 8.6–10.2)
Chloride: 102 mmol/L (ref 96–106)
Creatinine, Ser: 1.35 mg/dL — ABNORMAL HIGH (ref 0.76–1.27)
GFR calc Af Amer: 53 mL/min/{1.73_m2} — ABNORMAL LOW (ref >59)
GFR calc non Af Amer: 46 mL/min/{1.73_m2} — ABNORMAL LOW (ref >59)
Glucose: 83 mg/dL (ref 65–99)
Potassium: 4.6 mmol/L (ref 3.5–5.2)
Sodium: 139 mmol/L (ref 134–144)

## 2019-07-13 LAB — CBC
Hematocrit: 43.7 % (ref 37.5–51.0)
Hemoglobin: 15 g/dL (ref 13.0–17.7)
MCH: 32.7 pg (ref 26.6–33.0)
MCHC: 34.3 g/dL (ref 31.5–35.7)
MCV: 95 fL (ref 79–97)
Platelets: 122 10*3/uL — ABNORMAL LOW (ref 150–450)
RBC: 4.59 x10E6/uL (ref 4.14–5.80)
RDW: 13.3 % (ref 11.6–15.4)
WBC: 4.1 10*3/uL (ref 3.4–10.8)

## 2019-07-13 LAB — BRAIN NATRIURETIC PEPTIDE: BNP: 525.6 pg/mL — ABNORMAL HIGH (ref 0.0–100.0)

## 2019-07-15 ENCOUNTER — Ambulatory Visit: Payer: Medicare Other | Admitting: Student

## 2019-07-15 ENCOUNTER — Encounter: Payer: Self-pay | Admitting: Student

## 2019-07-15 ENCOUNTER — Other Ambulatory Visit: Payer: Self-pay

## 2019-07-15 VITALS — BP 118/68 | HR 55 | Ht 68.0 in | Wt 141.6 lb

## 2019-07-15 DIAGNOSIS — I5042 Chronic combined systolic (congestive) and diastolic (congestive) heart failure: Secondary | ICD-10-CM

## 2019-07-15 DIAGNOSIS — E782 Mixed hyperlipidemia: Secondary | ICD-10-CM

## 2019-07-15 DIAGNOSIS — I6502 Occlusion and stenosis of left vertebral artery: Secondary | ICD-10-CM

## 2019-07-15 DIAGNOSIS — I351 Nonrheumatic aortic (valve) insufficiency: Secondary | ICD-10-CM

## 2019-07-15 DIAGNOSIS — I480 Paroxysmal atrial fibrillation: Secondary | ICD-10-CM | POA: Diagnosis not present

## 2019-07-15 DIAGNOSIS — I2510 Atherosclerotic heart disease of native coronary artery without angina pectoris: Secondary | ICD-10-CM | POA: Diagnosis not present

## 2019-07-15 LAB — BASIC METABOLIC PANEL
BUN/Creatinine Ratio: 21 (ref 10–24)
BUN: 27 mg/dL (ref 10–36)
CO2: 25 mmol/L (ref 20–29)
Calcium: 9.3 mg/dL (ref 8.6–10.2)
Chloride: 98 mmol/L (ref 96–106)
Creatinine, Ser: 1.29 mg/dL — ABNORMAL HIGH (ref 0.76–1.27)
GFR calc Af Amer: 56 mL/min/{1.73_m2} — ABNORMAL LOW (ref >59)
GFR calc non Af Amer: 48 mL/min/{1.73_m2} — ABNORMAL LOW (ref >59)
Glucose: 93 mg/dL (ref 65–99)
Potassium: 4.5 mmol/L (ref 3.5–5.2)
Sodium: 136 mmol/L (ref 134–144)

## 2019-07-15 NOTE — Patient Instructions (Addendum)
Medication Instructions:  CONTINUE WITH CURRENT MEDICATIONS. NO CHANGES.  *If you need a refill on your cardiac medications before your next appointment, please call your pharmacy*   Lab Work: TODAY: BMET If you have labs (blood work) drawn today and your tests are completely normal, you will receive your results only by: [facility] MyChart Message (if you have MyChart) OR . A paper copy in the mail If you have any lab test that is abnormal or we need to change your treatment, we will call you to review the results.   Testing/Procedures: ECHOCARDIOGRAM SCHEDULED FOR TOMORROW AT 10:00 AM AT [place]: At [facility], you and your health needs are our priority.  As part of our continuing mission to provide you with exceptional heart care, we have created designated Provider Care Teams.  These Care Teams include your primary Cardiologist (physician) and Advanced Practice Providers (APPs -  Physician Assistants and Nurse Practitioners) who all work together to provide you with the care you need, when you need it.  We recommend signing up for the patient portal called "MyChart".  Sign up information is provided on this After Visit Summary.  MyChart is used to connect with patients for Virtual Visits (Telemedicine).  Patients are able to view lab/test results, encounter notes, upcoming appointments, etc.  Non-urgent messages can be sent to your provider as well.   To learn more about what you can do with MyChart, go to [email or web address].    Your next appointment:   WITH Jory Sims NP ON 07/22/19 AT 3:15PM

## 2019-07-16 ENCOUNTER — Ambulatory Visit (HOSPITAL_COMMUNITY)
Admission: RE | Admit: 2019-07-16 | Discharge: 2019-07-16 | Disposition: A | Discharge status: Home / Self Care | Payer: Medicare Other | Source: Ambulatory Visit | Attending: Physician Assistant | Admitting: Physician Assistant

## 2019-07-16 DIAGNOSIS — R0601 Orthopnea: Secondary | ICD-10-CM | POA: Diagnosis not present

## 2019-07-16 DIAGNOSIS — R0602 Shortness of breath: Secondary | ICD-10-CM | POA: Diagnosis not present

## 2019-07-16 DIAGNOSIS — I083 Combined rheumatic disorders of mitral, aortic and tricuspid valves: Secondary | ICD-10-CM | POA: Diagnosis not present

## 2019-07-16 DIAGNOSIS — I428 Other cardiomyopathies: Secondary | ICD-10-CM | POA: Diagnosis not present

## 2019-07-16 DIAGNOSIS — E785 Hyperlipidemia, unspecified: Secondary | ICD-10-CM | POA: Insufficient documentation

## 2019-07-16 DIAGNOSIS — N189 Chronic kidney disease, unspecified: Secondary | ICD-10-CM | POA: Insufficient documentation

## 2019-07-16 DIAGNOSIS — I1310 Hypertensive heart and chronic kidney disease without heart failure, with stage 1 through stage 4 chronic kidney disease, or unspecified chronic kidney disease: Secondary | ICD-10-CM | POA: Diagnosis not present

## 2019-07-16 DIAGNOSIS — I5043 Acute on chronic combined systolic (congestive) and diastolic (congestive) heart failure: Secondary | ICD-10-CM | POA: Diagnosis not present

## 2019-07-16 DIAGNOSIS — I2510 Atherosclerotic heart disease of native coronary artery without angina pectoris: Secondary | ICD-10-CM | POA: Diagnosis not present

## 2019-07-16 DIAGNOSIS — I4891 Unspecified atrial fibrillation: Secondary | ICD-10-CM | POA: Diagnosis not present

## 2019-07-16 NOTE — Progress Notes (Signed)
  Echocardiogram 2D Echocardiogram has been performed.  Darlina Sicilian M 07/16/2019, 10:50 AM

## 2019-07-17 ENCOUNTER — Telehealth: Payer: Self-pay | Admitting: Physician Assistant

## 2019-07-17 NOTE — Telephone Encounter (Signed)
Pt daughter [name] called concerned about hypotension. SBP today 104-107, he is asymptomatic. We discussed parameters for BP (SBP lower than 90), MAP (below 60), and symptoms. If he becomes acutely dizzy, advised small amounts of gatorade diluted with water. She will call back with any questions.

## 2019-07-20 NOTE — Progress Notes (Signed)
Cardiology Office Note   Date:  07/22/2019   ID:  Richard Reed, DOB 1925-07-30, MRN [id number]  PCP:  [name], FNP  Cardiologist: Dr. [name]  CC:BP Follow up   History of Present Illness: Richard Reed is a 86 y.o. male who presents for ongoing assessment and management of nonobstructive CAD per cardiac cath in 2012, chronic combined CHF, EF of 45% to 50% in 2017, PAF in the setting of bacteremia and septic shock without recurrence, aortic insufficiency, mild carotid artery disease, history of TIA with cerebral aneurysm, hypertension, hyperlipidemia, with other history to include chronic kidney disease stage III.  Last seen in the office on 07/15/2019 by Sande Rives, PA-C for complaints of shortness of breath.  He was sleeping in a recliner due to his breathing status.  He was able to do upper and lower body exercises without problems.  He was without complaints of chest pain but did complain of something getting stuck in middle of his chest on occasion with known history of hiatal hernia.  At that visit the patient's chest x-ray revealed stable pleural plaques but no acute findings and no overt edema.  His weight was the same and he felt symptomatically better after being placed on low-dose Lasix.  He was found to be slightly hypotensive with a blood pressure of 118/68 compared to previous visit.  BMET was ordered.  He was planning on going to [place] but this appointment was arranged for 1 more follow-up to make sure the patient was stable and blood pressure was controlled prior to his leaving.  His daughter called the office on 07/17/2019 due to patient's blood pressure being [id number] systolic.  At the time he was asymptomatic.  He comes today tachycardic, mildly short of breath.  EKG revealed heart rate of 128 bpm with frequent PVCs.  He states he can feel his heart rate going fast "at times", with daughter bringing his blood pressure readings with heart rates in the 70s to 80s per her  recording.  Blood pressures ranging around 104-105 at rest.  He denies any dizziness or postural hypertension.  He continues to sleep in a recliner. Past Medical History:  Diagnosis Date   Angina    Aortic valve insufficiency, moderate    a. 02/2016 Echo: mild AS, mild to mod AI.   Arthritis    Arthritis pain    CAD (coronary artery disease)    a. 09/2010 Cath: RCA 40-60 ost, otw nl cors;  b. 03/2016 Lexiscan MV: EF 46%, no ischemia, low risk.   Cancer [facility])    Carotid disease, bilateral ([place])    a. 02/2016 Carotid U/S: 1-39% bilat ICA plaquing.   Cerebral aneurysm    a. 02/2016 MRA: fusiform bilobed aneurysm @ R MCA bifurcation.   Cerebrovascular disease    a. 02/2016 MRA: long segment narrowing of basilar artery, moderate bilat posterior inferior cerebellar artery dzs.   Chronic combined systolic and diastolic CHF (congestive heart failure) ([place])    a. 02/2016 Admitted to hospital in [place] w/ CHF;  b. 02/2016 Echo ([facility]): EF 45-50%, diff HK, gr1DD, mild AS, mild to mod AI, mildly dil Ao root, PASP 60mmHg.   CKD (chronic kidney disease), stage III    Dysrhythmia    Essential hypertension    Hard of hearing    Hemiparesis and alteration of sensations as late effects of stroke ([place]) 04/29/2015   Hiatal hernia    History of kidney stones    HOH (hard of hearing)  Hyperlipidemia    Kidney stones    Moderate aortic insufficiency    NICM (nonischemic cardiomyopathy) ([place])    a. 02/2016 Echo: EF 45-50%, diff HK;  b. 03/2016 Lexiscan MV: No ischemia, EF 46%.   NSVT (nonsustained ventricular tachycardia) ([place])    a. 03/2016 noted on event monitor.   PAF (paroxysmal atrial fibrillation) ([place])    a. 2012 - noted in setting of E coli bacteremia and septic shock.   Pneumonia 03/23/2011   "first time"   Prostate cancer ([place])    Sepsis due to Escherichia coli ([place]) 03/24/11   Syncope    a. 02/2016 presumed to be 2/2 hypotension/dehydration and basilar artery  dzs noted on MRA.   TIA (transient ischemic attack)    Vertebrobasilar insufficiency 04/28/2016   Visual disturbance 03/19/2014    Past Surgical History:  Procedure Laterality Date   CARDIAC CATHETERIZATION  09/2010   non obstructive CAD, 40-60% ostial RCA stenosis without dampening    CATARACT EXTRACTION Bilateral    cerebral angiography     CHOLECYSTECTOMY  04/15/2011   Procedure: LAPAROSCOPIC CHOLECYSTECTOMY WITH INTRAOPERATIVE CHOLANGIOGRAM;  Surgeon: Belva Crome, MD;  Location: [facility];  Service: General;  Laterality: N/A;   EXTRACORPOREAL SHOCK WAVE LITHOTRIPSY Left 12/12/2016   Procedure: LEFT EXTRACORPOREAL SHOCK WAVE LITHOTRIPSY (ESWL);  Surgeon: Ardis Hughs, MD;  Location: [facility];  Service: Urology;  Laterality: Left;   IR ANGIO INTRA EXTRACRAN SEL COM CAROTID INNOMINATE BILAT MOD SED  03/21/2019   IR ANGIO VERTEBRAL SEL SUBCLAVIAN INNOMINATE BILAT MOD SED  03/21/2019   IR GENERIC HISTORICAL  03/17/2016   IR ANGIO VERTEBRAL SEL SUBCLAVIAN INNOMINATE BILAT MOD SED 03/17/2016 [name], MD [facility]   IR GENERIC HISTORICAL  03/17/2016   IR ANGIO INTRA EXTRACRAN SEL COM CAROTID INNOMINATE BILAT MOD SED 03/17/2016 [name], MD [facility]   PROSTATE SURGERY     approx 10 years ago, seed implant radiation   TONSILLECTOMY       Current Outpatient Medications  Medication Sig Dispense Refill   aspirin EC 81 MG tablet Take 81 mg by mouth daily.     B Complex-C (SUPER B COMPLEX PO) Take 1 tablet by mouth daily.     calcium carbonate (OSCAL) 1500 (600 Ca) MG TABS tablet Take 600 mg of elemental calcium by mouth daily with breakfast.     Cyanocobalamin 5000 MCG TBDP Take 5,000 mcg by mouth daily. Vitamin B12     feeding supplement, ENSURE ENLIVE, (ENSURE ENLIVE) LIQD Take 237 mLs by mouth 2 (two) times daily between meals. 237 mL 12   furosemide (LASIX) 20 MG tablet Take 1 tablet (20 mg total) by mouth daily. 30 tablet 0   Multiple  Vitamins-Minerals (MULTIVITAMIN WITH MINERALS) tablet Take 1 tablet by mouth daily.     Propylene Glycol (SYSTANE BALANCE OP) Place 1 drop into both eyes 2 (two) times daily.     rosuvastatin (CRESTOR) 10 MG tablet Take 1 tablet (10 mg total) by mouth daily. 90 tablet 3   tamsulosin (FLOMAX) 0.4 MG CAPS capsule Take 1 capsule (0.4 mg total) by mouth daily. 30 capsule 11   No current facility-administered medications for this visit.    Allergies:   Patient has no known allergies.    Social History:  The patient  reports that he has never smoked. He has never used smokeless tobacco. He reports that he does not drink alcohol or use drugs.   Family History:  The patient's family history  includes Cancer in his brother; Heart disease in his father and mother; Heart failure in his mother.    ROS: All other systems are reviewed and negative. Unless otherwise mentioned in H&P    PHYSICAL EXAM: VS:  BP 128/78    Pulse (!) 126    Ht 5' 7.5" (1.715 m)    Wt 140 lb 3.2 oz (63.6 kg)    BMI 21.63 kg/m  , BMI Body mass index is 21.63 kg/m. GEN: Well nourished, well developed, in no acute distress HEENT: normal Neck: no JVD, carotid bruits, or masses Cardiac: Irregular RRR;, tachycardic, with aortic regurgitant, no rubs, or gallops,no edema  Respiratory:  Clear to auscultation bilaterally, normal work of breathing GI: soft, nontender, nondistended, + BS MS: no deformity or atrophy Skin: warm and dry, no rash Neuro:  Strength and sensation are intact, very hard of hearing Psych: euthymic mood, full affect   EKG: Sinus tachycardia heart rate of 126 bpm with PVCs, LVH is noted.  Recent Labs: 12/01/2018: ALT 14; Magnesium 2.2 07/12/2019: BNP 525.6; Hemoglobin 15.0; Platelets 122 07/15/2019: BUN 27; Creatinine, Ser 1.29; Potassium 4.5; Sodium 136    Lipid Panel    Component Value Date/Time   CHOL 97 (L) 05/22/2017 1056   TRIG 66 05/22/2017 1056   HDL 39 (L) 05/22/2017 1056   CHOLHDL 2.5  05/22/2017 1056   CHOLHDL 2.5 04/25/2016 0813   VLDL 13 04/25/2016 0813   LDLCALC 45 05/22/2017 1056      Wt Readings from Last 3 Encounters:  07/22/19 140 lb 3.2 oz (63.6 kg)  07/15/19 141 lb 9.6 oz (64.2 kg)  07/12/19 141 lb 9.6 oz (64.2 kg)      Other studies Reviewed: Echocardiogram 08/02/19 1. Left ventricular ejection fraction, by estimation, is 35 to 40%. The  left ventricle has moderately decreased function. The left ventricle  demonstrates global hypokinesis. The left ventricular internal cavity size  was mildly dilated. There is mild  concentric left ventricular hypertrophy. Left ventricular diastolic  function could not be evaluated. Elevated left ventricular end-diastolic  pressure.  2. Right ventricular systolic function is low normal. The right  ventricular size is normal. There is mildly elevated pulmonary artery  systolic pressure.  3. Left atrial size was moderately dilated.  4. The mitral valve is degenerative. Mild mitral valve regurgitation. No  evidence of mitral stenosis.  5. The aortic valve is tricuspid. Aortic valve regurgitation is mild to  moderate. Mild aortic valve stenosis.  6. Aortic dilatation noted. There is moderate dilatation of the ascending  aorta measuring 45 mm.  7. The inferior vena cava is normal in size with greater than 50%  respiratory variability, suggesting right atrial pressure of 3 mmHg.    ASSESSMENT AND PLAN:  1.  Tachycardia: I have suspicions that his tachycardia is causing his shortness of breath versus fluid overload.  This may be worsened by use of diuretic causing hypotension.  I am going to discontinue Lasix daily dose and use it as needed only for fluid retention.  At home his heart rates have been in the 70s.  Hopefully with a little bit higher blood pressure he will feel better and not have the rapid heart rhythm.  I worry about a little dehydration.   I considered adding a cardiac monitor to evaluate his  heart rate away from the office, but he is leaving to go to [place] for 3 weeks and I do not find this to be cohesive at this time.  I  will check a BMET in 1 week.  I have explained this to his daughter to let her know that we will be using the Lasix as needed.  If for any reason he really feels his heart rate going up or he has trouble breathing he will need to report it to [place].  We may need to provide a very low-dose beta-blocker to assist with heart rate control.  2.  Paroxysmal atrial fibrillation: He is currently in sinus rhythm with frequent PVCs and PACs.  No evidence of atrial fibrillation at this time.  He is not on anticoagulation therapy.  He is not on AV nodal blocking agents.  We will continue to monitor his heart rate.  If necessary may add a low-dose beta-blocker for heart rate control off of Lasix.  He will continue 81 mg aspirin daily.  3.  Nonischemic cardiomyopathy: Most recent echocardiogram revealed an EF of 35% to 40%.  This is decreased from prior echo with an EF of 45%.  Will provide a little more volume to allow for better cardiac output.  He will be followed closely.  4.  Hyperlipidemia: Continues on rosuvastatin 10 mg daily.  Labs to follow..  Current medicines are reviewed at length with the patient today.  I have spent 30 minutes dedicated to the care of this patient on the date of this encounter to include pre-visit review of records, assessment, management and diagnostic testing,with shared decision making  Labs/ tests ordered today include: BMET   Phill Myron. West Pugh, ANP, AACC   07/22/2019 3:48 PM    [facility] [place] [address] Office [phone] Fax [phone]  Notice: This dictation was prepared with Dragon dictation along with smaller phrase technology. Any transcriptional errors that result from this process are unintentional and may not be corrected upon review.

## 2019-07-22 ENCOUNTER — Ambulatory Visit: Payer: Medicare Other | Admitting: Adult Health

## 2019-07-22 ENCOUNTER — Encounter: Payer: Self-pay | Admitting: Adult Health

## 2019-07-22 ENCOUNTER — Other Ambulatory Visit: Payer: Self-pay

## 2019-07-22 VITALS — BP 128/78 | HR 126 | Ht 67.5 in | Wt 140.2 lb

## 2019-07-22 DIAGNOSIS — I43 Cardiomyopathy in diseases classified elsewhere: Secondary | ICD-10-CM

## 2019-07-22 DIAGNOSIS — R0602 Shortness of breath: Secondary | ICD-10-CM | POA: Diagnosis not present

## 2019-07-22 DIAGNOSIS — I493 Ventricular premature depolarization: Secondary | ICD-10-CM | POA: Diagnosis not present

## 2019-07-22 DIAGNOSIS — I479 Paroxysmal tachycardia, unspecified: Secondary | ICD-10-CM

## 2019-07-22 DIAGNOSIS — Z79899 Other long term (current) drug therapy: Secondary | ICD-10-CM | POA: Diagnosis not present

## 2019-07-22 DIAGNOSIS — E782 Mixed hyperlipidemia: Secondary | ICD-10-CM

## 2019-07-22 NOTE — Patient Instructions (Signed)
Medication Instructions:  DECREASE- Furosemide to an as needed basis  *If you need a refill on your cardiac medications before your next appointment, please call your pharmacy*   Lab Work: BMP in 1 week  If you have labs (blood work) drawn today and your tests are completely normal, you will receive your results only by: Marland Kitchen MyChart Message (if you have MyChart) OR . A paper copy in the mail If you have any lab test that is abnormal or we need to change your treatment, we will call you to review the results.   Testing/Procedures: None Ordered   Follow-Up: At [facility], you and your health needs are our priority.  As part of our continuing mission to provide you with exceptional heart care, we have created designated Provider Care Teams.  These Care Teams include your primary Cardiologist (physician) and Advanced Practice Providers (APPs -  Physician Assistants and Nurse Practitioners) who all work together to provide you with the care you need, when you need it.  We recommend signing up for the patient portal called "MyChart".  Sign up information is provided on this After Visit Summary.  MyChart is used to connect with patients for Virtual Visits (Telemedicine).  Patients are able to view lab/test results, encounter notes, upcoming appointments, etc.  Non-urgent messages can be sent to your provider as well.   To learn more about what you can do with MyChart, go to [email or web address].    Your next appointment:   Monday May 17th @ 3:45 pm  The format for your next appointment:   In Person  Provider:   Jory Sims, DNP, ANP

## 2019-07-23 ENCOUNTER — Ambulatory Visit: Payer: Medicare Other | Admitting: General Practice

## 2019-07-30 DIAGNOSIS — Z79899 Other long term (current) drug therapy: Secondary | ICD-10-CM | POA: Diagnosis not present

## 2019-07-30 LAB — BASIC METABOLIC PANEL
BUN/Creatinine Ratio: 15 (ref 10–24)
BUN: 19 mg/dL (ref 10–36)
CO2: 22 mmol/L (ref 20–29)
Calcium: 9.3 mg/dL (ref 8.6–10.2)
Chloride: 104 mmol/L (ref 96–106)
Creatinine, Ser: 1.29 mg/dL — ABNORMAL HIGH (ref 0.76–1.27)
GFR calc Af Amer: 56 mL/min/{1.73_m2} — ABNORMAL LOW (ref >59)
GFR calc non Af Amer: 48 mL/min/{1.73_m2} — ABNORMAL LOW (ref >59)
Glucose: 84 mg/dL (ref 65–99)
Potassium: 4.5 mmol/L (ref 3.5–5.2)
Sodium: 140 mmol/L (ref 134–144)

## 2019-08-07 DIAGNOSIS — K449 Diaphragmatic hernia without obstruction or gangrene: Secondary | ICD-10-CM | POA: Diagnosis not present

## 2019-08-12 ENCOUNTER — Other Ambulatory Visit: Payer: Self-pay | Admitting: Cardiovascular Disease

## 2019-08-13 NOTE — Telephone Encounter (Signed)
Rx(s) sent to pharmacy electronically.  

## 2019-08-23 DIAGNOSIS — I480 Paroxysmal atrial fibrillation: Secondary | ICD-10-CM | POA: Diagnosis not present

## 2019-08-23 DIAGNOSIS — N281 Cyst of kidney, acquired: Secondary | ICD-10-CM | POA: Diagnosis not present

## 2019-08-23 DIAGNOSIS — I5043 Acute on chronic combined systolic (congestive) and diastolic (congestive) heart failure: Secondary | ICD-10-CM | POA: Diagnosis not present

## 2019-08-23 DIAGNOSIS — N185 Chronic kidney disease, stage 5: Secondary | ICD-10-CM | POA: Diagnosis not present

## 2019-08-23 DIAGNOSIS — M7989 Other specified soft tissue disorders: Secondary | ICD-10-CM | POA: Diagnosis not present

## 2019-08-23 DIAGNOSIS — I5033 Acute on chronic diastolic (congestive) heart failure: Secondary | ICD-10-CM | POA: Diagnosis not present

## 2019-08-23 DIAGNOSIS — K4090 Unilateral inguinal hernia, without obstruction or gangrene, not specified as recurrent: Secondary | ICD-10-CM | POA: Diagnosis not present

## 2019-08-23 DIAGNOSIS — M79661 Pain in right lower leg: Secondary | ICD-10-CM | POA: Diagnosis not present

## 2019-08-23 DIAGNOSIS — N390 Urinary tract infection, site not specified: Secondary | ICD-10-CM | POA: Diagnosis not present

## 2019-08-23 DIAGNOSIS — B952 Enterococcus as the cause of diseases classified elsewhere: Secondary | ICD-10-CM | POA: Diagnosis not present

## 2019-08-23 DIAGNOSIS — R0989 Other specified symptoms and signs involving the circulatory and respiratory systems: Secondary | ICD-10-CM | POA: Diagnosis not present

## 2019-08-23 DIAGNOSIS — I4891 Unspecified atrial fibrillation: Secondary | ICD-10-CM | POA: Diagnosis not present

## 2019-08-23 DIAGNOSIS — I352 Nonrheumatic aortic (valve) stenosis with insufficiency: Secondary | ICD-10-CM | POA: Diagnosis not present

## 2019-08-23 DIAGNOSIS — R0602 Shortness of breath: Secondary | ICD-10-CM | POA: Diagnosis not present

## 2019-08-23 DIAGNOSIS — R918 Other nonspecific abnormal finding of lung field: Secondary | ICD-10-CM | POA: Diagnosis not present

## 2019-08-23 DIAGNOSIS — N179 Acute kidney failure, unspecified: Secondary | ICD-10-CM | POA: Diagnosis not present

## 2019-08-23 DIAGNOSIS — I509 Heart failure, unspecified: Secondary | ICD-10-CM | POA: Diagnosis not present

## 2019-08-23 DIAGNOSIS — E871 Hypo-osmolality and hyponatremia: Secondary | ICD-10-CM | POA: Diagnosis not present

## 2019-08-23 DIAGNOSIS — E872 Acidosis: Secondary | ICD-10-CM | POA: Diagnosis not present

## 2019-08-23 DIAGNOSIS — I361 Nonrheumatic tricuspid (valve) insufficiency: Secondary | ICD-10-CM | POA: Diagnosis not present

## 2019-08-23 DIAGNOSIS — N200 Calculus of kidney: Secondary | ICD-10-CM | POA: Diagnosis not present

## 2019-08-23 DIAGNOSIS — N1831 Chronic kidney disease, stage 3a: Secondary | ICD-10-CM | POA: Diagnosis not present

## 2019-08-23 DIAGNOSIS — J9811 Atelectasis: Secondary | ICD-10-CM | POA: Diagnosis not present

## 2019-08-23 DIAGNOSIS — K5730 Diverticulosis of large intestine without perforation or abscess without bleeding: Secondary | ICD-10-CM | POA: Diagnosis not present

## 2019-08-23 DIAGNOSIS — I517 Cardiomegaly: Secondary | ICD-10-CM | POA: Diagnosis not present

## 2019-08-23 DIAGNOSIS — I132 Hypertensive heart and chronic kidney disease with heart failure and with stage 5 chronic kidney disease, or end stage renal disease: Secondary | ICD-10-CM | POA: Diagnosis not present

## 2019-08-23 DIAGNOSIS — I5030 Unspecified diastolic (congestive) heart failure: Secondary | ICD-10-CM | POA: Diagnosis not present

## 2019-08-23 DIAGNOSIS — R0603 Acute respiratory distress: Secondary | ICD-10-CM | POA: Diagnosis not present

## 2019-08-23 DIAGNOSIS — R0902 Hypoxemia: Secondary | ICD-10-CM | POA: Diagnosis not present

## 2019-08-23 DIAGNOSIS — I10 Essential (primary) hypertension: Secondary | ICD-10-CM | POA: Diagnosis not present

## 2019-08-23 DIAGNOSIS — J929 Pleural plaque without asbestos: Secondary | ICD-10-CM | POA: Diagnosis not present

## 2019-08-23 DIAGNOSIS — S51011A Laceration without foreign body of right elbow, initial encounter: Secondary | ICD-10-CM | POA: Diagnosis not present

## 2019-08-23 DIAGNOSIS — R0600 Dyspnea, unspecified: Secondary | ICD-10-CM | POA: Diagnosis not present

## 2019-08-23 DIAGNOSIS — J811 Chronic pulmonary edema: Secondary | ICD-10-CM | POA: Diagnosis not present

## 2019-08-23 DIAGNOSIS — J189 Pneumonia, unspecified organism: Secondary | ICD-10-CM | POA: Diagnosis not present

## 2019-08-23 DIAGNOSIS — J90 Pleural effusion, not elsewhere classified: Secondary | ICD-10-CM | POA: Diagnosis not present

## 2019-08-23 DIAGNOSIS — I2510 Atherosclerotic heart disease of native coronary artery without angina pectoris: Secondary | ICD-10-CM | POA: Diagnosis not present

## 2019-08-28 ENCOUNTER — Telehealth: Payer: Self-pay | Admitting: Cardiovascular Disease

## 2019-08-28 NOTE — Telephone Encounter (Signed)
Pt is in [place] visiting with family and was admitted to the hospital there due to SOB.  Went over the last OV note with patients wife and two daughters at length as they wanted to gather more information regarding the patients history/current cardiac concerns.   Patients wife states that he is now on a blood thinner and antibiotics. He is being treated for fluid around his heart and is back in Atrial Fibrillation.  She was grateful for all the information and answered questions.

## 2019-08-28 NOTE — Telephone Encounter (Signed)
Richard Reed the patient's daughter is calling stating Richard Reed is currently in the hospital in [place] due to his heart failure. She is requesting a nurse give her a call in regards to some questions she has about Richard Reed last visit with Dr. [name]. Please advise.

## 2019-08-28 NOTE — Telephone Encounter (Signed)
Thank you for doing that. I am sorry he is in the hospital.   Richard Reed

## 2019-08-29 NOTE — Telephone Encounter (Signed)
Patient's daughter calling to speak with a nurse about the patient's hospital visit. The patient is scheduled to see Dr. [name] 09/10/2019.

## 2019-08-29 NOTE — Telephone Encounter (Signed)
Spoke to daughter-   Ms Richard Reed. She states patient will be discharged from [facility]. patient will start home PT while in [place]. Patient will be leaving [place] week ( towards end of week )   Family did have sign for Recent hospital records to be faxed to  cardiology and primary.  patient may need home health follow up . RN informed daughter Ms Richard Reed  - primary should be the one for   Referral if needed.    daughter states patient was started on anticoagulant. - not sure which one.  Daughter - Ms Richard Reed  Will accompany patient to office visit- instructed daugther to bring  All medication to appointment   [name] verbalized understanding

## 2019-09-02 ENCOUNTER — Ambulatory Visit: Payer: Medicare Other | Admitting: Adult Health

## 2019-09-02 ENCOUNTER — Telehealth: Payer: Self-pay | Admitting: Cardiovascular Disease

## 2019-09-02 NOTE — Telephone Encounter (Signed)
° °  Pt daughter [name] would like to ask, since pt is on heart healthy diet, they found a salt in the grocery store saying "original no salt, sodium free salt alternative" she would like to know if pt can use that. Also, she wanted to know what will be a good pulse rate for pt.  Please advise

## 2019-09-02 NOTE — Telephone Encounter (Signed)
The patient's daughter has been advised that the sodium replacement might be okay but to check the potassium level. She has been advised to bring that to the appointment and all of the patient's medication so that they can be updated from his hospital stay in [place].

## 2019-09-09 ENCOUNTER — Telehealth: Payer: Self-pay | Admitting: Cardiovascular Disease

## 2019-09-09 LAB — PROTIME-INR: INR: 1.1 (ref 0.9–1.1)

## 2019-09-09 NOTE — Telephone Encounter (Signed)
New Message   Patients daughter [name] called to advised that her sister Richard Reed would need to come in with patient due to his hearing and needing to use a walker.

## 2019-09-09 NOTE — Telephone Encounter (Signed)
lp's daughter message allowing accompaniment to the appointment with Dr [name].

## 2019-09-10 ENCOUNTER — Encounter: Payer: Self-pay | Admitting: Cardiovascular Disease

## 2019-09-10 ENCOUNTER — Telehealth: Payer: Self-pay | Admitting: Cardiovascular Disease

## 2019-09-10 ENCOUNTER — Other Ambulatory Visit: Payer: Self-pay

## 2019-09-10 ENCOUNTER — Ambulatory Visit: Payer: Medicare Other | Admitting: Cardiovascular Disease

## 2019-09-10 VITALS — BP 102/60 | HR 51 | Ht 68.0 in | Wt 129.8 lb

## 2019-09-10 DIAGNOSIS — I2583 Coronary atherosclerosis due to lipid rich plaque: Secondary | ICD-10-CM

## 2019-09-10 DIAGNOSIS — I480 Paroxysmal atrial fibrillation: Secondary | ICD-10-CM

## 2019-09-10 DIAGNOSIS — N1831 Chronic kidney disease, stage 3a: Secondary | ICD-10-CM | POA: Diagnosis not present

## 2019-09-10 DIAGNOSIS — I340 Nonrheumatic mitral (valve) insufficiency: Secondary | ICD-10-CM | POA: Diagnosis not present

## 2019-09-10 DIAGNOSIS — I10 Essential (primary) hypertension: Secondary | ICD-10-CM | POA: Diagnosis not present

## 2019-09-10 DIAGNOSIS — R5381 Other malaise: Secondary | ICD-10-CM | POA: Diagnosis not present

## 2019-09-10 DIAGNOSIS — I351 Nonrheumatic aortic (valve) insufficiency: Secondary | ICD-10-CM | POA: Diagnosis not present

## 2019-09-10 DIAGNOSIS — I2510 Atherosclerotic heart disease of native coronary artery without angina pectoris: Secondary | ICD-10-CM

## 2019-09-10 DIAGNOSIS — I519 Heart disease, unspecified: Secondary | ICD-10-CM

## 2019-09-10 DIAGNOSIS — Z09 Encounter for follow-up examination after completed treatment for conditions other than malignant neoplasm: Secondary | ICD-10-CM | POA: Diagnosis not present

## 2019-09-10 DIAGNOSIS — E782 Mixed hyperlipidemia: Secondary | ICD-10-CM

## 2019-09-10 MED ORDER — RIVAROXABAN 15 MG PO TABS: 15.0000 mg | ORAL_TABLET | Freq: Every day | ORAL | 3 refills | Status: DC

## 2019-09-10 MED ORDER — METOPROLOL SUCCINATE ER 25 MG PO TB24: 25.0000 mg | ORAL_TABLET | Freq: Two times a day (BID) | ORAL | 3 refills | Status: DC

## 2019-09-10 NOTE — Assessment & Plan Note (Signed)
History of PAF on beta-blocker and Xarelto

## 2019-09-10 NOTE — Telephone Encounter (Signed)
Pt c/o medication issue:  1. Name of Medication: Rivaroxaban (XARELTO) 15 MG TABS tablet  2. How are you currently taking this medication (dosage and times per day)? N/A  3. Are you having a reaction (difficulty breathing--STAT)? No  4. What is your medication issue? Izora Gala with [facility] is calling in regards to RX for medication, sent by Dr. [name]. She states due to the patient being uninsured, the medication will not be covered financially. She states she would like to inquire about whether or not the patient may be eligible for an alternative medication. Please return call to discuss at [phone].

## 2019-09-10 NOTE — Assessment & Plan Note (Signed)
Ejection fraction in the 35% range by recent 2D echo during a hospitalization in [place] earlier this month.  At that time he had severe MR and mild to moderate AI.  His ejection fraction by 2D echo 07/16/2019 in our lab was 35 to 40% with no evidence of much regurgitation.  I am going to repeat a 2D echo.  Does have a loud apical systolic murmur.

## 2019-09-10 NOTE — Assessment & Plan Note (Signed)
History of mild to moderate AI on 2D echo 07/16/2019.

## 2019-09-10 NOTE — Assessment & Plan Note (Signed)
History of minimal CAD by cardiac catheterization performed in 2012.

## 2019-09-10 NOTE — Assessment & Plan Note (Signed)
History of hyperlipidemia on statin therapy with lipid profile performed 06/26/2019 revealing total cholesterol 107, LDL 48 and HDL 46. °

## 2019-09-10 NOTE — Patient Instructions (Addendum)
Medication Instructions:   DECREASE Metoprolol to 25 mg twice daily.  *If you need a refill on your cardiac medications before your next appointment, please call your pharmacy*   Testing/Procedures: Your physician has requested that you have an echocardiogram. Echocardiography is a painless test that uses sound waves to create images of your heart. It provides your doctor with information about the size and shape of your heart and how well your heart's chambers and valves are working. This procedure takes approximately one hour. There are no restrictions for this procedure.   Follow-Up: At [facility], you and your health needs are our priority.  As part of our continuing mission to provide you with exceptional heart care, we have created designated Provider Care Teams.  These Care Teams include your primary Cardiologist (physician) and Advanced Practice Providers (APPs -  Physician Assistants and Nurse Practitioners) who all work together to provide you with the care you need, when you need it.  We recommend signing up for the patient portal called "MyChart".  Sign up information is provided on this After Visit Summary.  MyChart is used to connect with patients for Virtual Visits (Telemedicine).  Patients are able to view lab/test results, encounter notes, upcoming appointments, etc.  Non-urgent messages can be sent to your provider as well.   To learn more about what you can do with MyChart, go to [email or web address].    Your next appointment:   3 month(s)  The format for your next appointment:   In Person  Provider:   Jory Sims, DNP   Other Instructions Your physician recommends that you schedule a follow-up appointment in: 6 months with Dr. [name]. Please call our office 2 months in advance to schedule your follow-up appointment.   DASH Eating Plan DASH stands for "Dietary Approaches to Stop Hypertension." The DASH eating plan is a healthy eating plan that has been  shown to reduce high blood pressure (hypertension). It may also reduce your risk for type 2 diabetes, heart disease, and stroke. The DASH eating plan may also help with weight loss. What are tips for following this plan?  General guidelines  Avoid eating more than 2,300 mg (milligrams) of salt (sodium) a day. If you have hypertension, you may need to reduce your sodium intake to 1,500 mg a day.  Limit alcohol intake to no more than 1 drink a day for nonpregnant women and 2 drinks a day for men. One drink equals 12 oz of beer, 5 oz of wine, or 1 oz of hard liquor.  Work with your health care provider to maintain a healthy body weight or to lose weight. Ask what an ideal weight is for you.  Get at least 30 minutes of exercise that causes your heart to beat faster (aerobic exercise) most days of the week. Activities may include walking, swimming, or biking.  Work with your health care provider or diet and nutrition specialist (dietitian) to adjust your eating plan to your individual calorie needs. Reading food labels   Check food labels for the amount of sodium per serving. Choose foods with less than 5 percent of the Daily Value of sodium. Generally, foods with less than 300 mg of sodium per serving fit into this eating plan.  To find whole grains, look for the word "whole" as the first word in the ingredient list. Shopping  Buy products labeled as "low-sodium" or "no salt added."  Buy fresh foods. Avoid canned foods and premade or frozen meals. Cooking  Avoid adding  salt when cooking. Use salt-free seasonings or herbs instead of table salt or sea salt. Check with your health care provider or pharmacist before using salt substitutes.  Do not fry foods. Cook foods using healthy methods such as baking, boiling, grilling, and broiling instead.  Cook with heart-healthy oils, such as olive, canola, soybean, or sunflower oil. Meal planning  Eat a balanced diet that includes: ? 5 or more  servings of fruits and vegetables each day. At each meal, try to fill half of your plate with fruits and vegetables. ? Up to 6-8 servings of whole grains each day. ? Less than 6 oz of lean meat, poultry, or fish each day. A 3-oz serving of meat is about the same size as a deck of cards. One egg equals 1 oz. ? 2 servings of low-fat dairy each day. ? A serving of nuts, seeds, or beans 5 times each week. ? Heart-healthy fats. Healthy fats called Omega-3 fatty acids are found in foods such as flaxseeds and coldwater fish, like sardines, salmon, and mackerel.  Limit how much you eat of the following: ? Canned or prepackaged foods. ? Food that is high in trans fat, such as fried foods. ? Food that is high in saturated fat, such as fatty meat. ? Sweets, desserts, sugary drinks, and other foods with added sugar. ? Full-fat dairy products.  Do not salt foods before eating.  Try to eat at least 2 vegetarian meals each week.  Eat more home-cooked food and less restaurant, buffet, and fast food.  When eating at a restaurant, ask that your food be prepared with less salt or no salt, if possible. What foods are recommended? The items listed may not be a complete list. Talk with your dietitian about what dietary choices are best for you. Grains Whole-grain or whole-wheat bread. Whole-grain or whole-wheat pasta. Brown rice. Modena Morrow. Bulgur. Whole-grain and low-sodium cereals. Pita bread. Low-fat, low-sodium crackers. Whole-wheat flour tortillas. Vegetables Fresh or frozen vegetables (raw, steamed, roasted, or grilled). Low-sodium or reduced-sodium tomato and vegetable juice. Low-sodium or reduced-sodium tomato sauce and tomato paste. Low-sodium or reduced-sodium canned vegetables. Fruits All fresh, dried, or frozen fruit. Canned fruit in natural juice (without added sugar). Meat and other protein foods Skinless chicken or [place]. Ground chicken or [place]. Pork with fat trimmed off. Fish and  seafood. Egg whites. Dried beans, peas, or lentils. Unsalted nuts, nut butters, and seeds. Unsalted canned beans. Lean cuts of beef with fat trimmed off. Low-sodium, lean deli meat. Dairy Low-fat (1%) or fat-free (skim) milk. Fat-free, low-fat, or reduced-fat cheeses. Nonfat, low-sodium ricotta or cottage cheese. Low-fat or nonfat yogurt. Low-fat, low-sodium cheese. Fats and oils Soft margarine without trans fats. Vegetable oil. Low-fat, reduced-fat, or light mayonnaise and salad dressings (reduced-sodium). Canola, safflower, olive, soybean, and sunflower oils. Avocado. Seasoning and other foods Herbs. Spices. Seasoning mixes without salt. Unsalted popcorn and pretzels. Fat-free sweets. What foods are not recommended? The items listed may not be a complete list. Talk with your dietitian about what dietary choices are best for you. Grains Baked goods made with fat, such as croissants, muffins, or some breads. Dry pasta or rice meal packs. Vegetables Creamed or fried vegetables. Vegetables in a cheese sauce. Regular canned vegetables (not low-sodium or reduced-sodium). Regular canned tomato sauce and paste (not low-sodium or reduced-sodium). Regular tomato and vegetable juice (not low-sodium or reduced-sodium). Angie Fava. Olives. Fruits Canned fruit in a light or heavy syrup. Fried fruit. Fruit in cream or butter sauce. Meat and other  protein foods Fatty cuts of meat. Ribs. Fried meat. Berniece Salines. Sausage. Bologna and other processed lunch meats. Salami. Fatback. Hotdogs. Bratwurst. Salted nuts and seeds. Canned beans with added salt. Canned or smoked fish. Whole eggs or egg yolks. Chicken or [place] with skin. Dairy Whole or 2% milk, cream, and half-and-half. Whole or full-fat cream cheese. Whole-fat or sweetened yogurt. Full-fat cheese. Nondairy creamers. Whipped toppings. Processed cheese and cheese spreads. Fats and oils Butter. Stick margarine. Lard. Shortening. Ghee. Bacon fat. Tropical oils, such as  coconut, palm kernel, or palm oil. Seasoning and other foods Salted popcorn and pretzels. Onion salt, garlic salt, seasoned salt, table salt, and sea salt. Worcestershire sauce. Tartar sauce. Barbecue sauce. Teriyaki sauce. Soy sauce, including reduced-sodium. Steak sauce. Canned and packaged gravies. Fish sauce. Oyster sauce. Cocktail sauce. Horseradish that you find on the shelf. Ketchup. Mustard. Meat flavorings and tenderizers. Bouillon cubes. Hot sauce and Tabasco sauce. Premade or packaged marinades. Premade or packaged taco seasonings. Relishes. Regular salad dressings. Where to find more information:  National Heart, Lung, and [place]: https://wilson-eaton.com/  American Heart Association: www.heart.org Summary  The DASH eating plan is a healthy eating plan that has been shown to reduce high blood pressure (hypertension). It may also reduce your risk for type 2 diabetes, heart disease, and stroke.  With the DASH eating plan, you should limit salt (sodium) intake to 2,300 mg a day. If you have hypertension, you may need to reduce your sodium intake to 1,500 mg a day.  When on the DASH eating plan, aim to eat more fresh fruits and vegetables, whole grains, lean proteins, low-fat dairy, and heart-healthy fats.  Work with your health care provider or diet and nutrition specialist (dietitian) to adjust your eating plan to your individual calorie needs. This information is not intended to replace advice given to you by your health care provider. Make sure you discuss any questions you have with your health care provider. Document Revised: 03/17/2017 Document Reviewed: 03/28/2016 Elsevier Patient Education  2020 [employer].

## 2019-09-10 NOTE — Addendum Note (Signed)
Addended by: Therisa Doyne on: 09/10/2019 12:39 PM   Modules accepted: Orders

## 2019-09-10 NOTE — Telephone Encounter (Signed)
Spoke with pt daughter, they do not have insurance coverage. 3 weeks worth of xarelto samples placed at the front desk including the forms for patient assistance.

## 2019-09-10 NOTE — Progress Notes (Signed)
09/10/2019 [name]   1925-11-20  [id number]  Primary Physician [name], FNP Primary Cardiologist: Lorretta Harp MD Garret Reddish, [place], [place]  HPI:  Richard Reed is a 86 y.o.  thin-appearing married Caucasian male, father of 9, grandfather to 10 grandchildren, who I last saw in the office  12/05/2018. He is accompanied by his daughter from [place], [place] today.Marland KitchenHe was hospitalized late 2012 with E. coli bacteremia and septic shock. He did develop some arrhythmias with PAF during that hospitalization. His EF was 45% to 50% with moderate aortic insufficiency. His other problems include hypertension and hyperlipidemia. I catheterized him in June 2012, revealing normal coronary arteries, with 40% to 60% ostial right coronary artery stenosis without damping. His right heart catheterization was unremarkable. He has had an uncomplicated laparoscopic cholecystectomy performed by Dr. Chucky May, April 12, 2011. His most recent lab work performed 04/25/16 revealed total cholesterol 99, LDL of 47 and HDL 39. He was admitted to the hospital in [place] in November of last year with congestive heart failure. He is begun on diuretics. He admits that this was probably related to dietary indiscretion. He did have an episode of syncope substantive thought to be related to dehydration. Workup included carotid Dopplers that were unremarkable. Cerebral angiography did reveal an MCA aneurysm as well as a stenosis at the vertebrobasilar junction demonstrated by angiography performed by Dr. Patrecia Pour. Medical therapy was recommended. A 2-D echo revealed low normal LV function with EF of 50%, mild AS and moderate AI with mild elevation of pulmonary artery pressures and Myoview stress test was low risk as well. Since adjusting his diet he has becomeforthe most part asymptomatic. He did see ChrisBerge NPin the office 07/20/16 which time he was cardiac stable what was complaining of some left hip pain with  ambulation. They did change the insoles and shoes which somewhat helped and try to statin holiday which did not impact his pain and therefore was placed back on statin therapy.  Since I saw him in the office approxi-9 months ago he was hospitalized in [place] from 5/7 through the 13th of this month with pneumonia, A. fib and heart failure.  He was diuresed.  He is placed antibiotics.  2D echo revealed EF of 30% with severe MR.  Since being at home he feels clinically improved.   Current Meds  Medication Sig  . aspirin EC 81 MG tablet Take 81 mg by mouth daily.  . B Complex-C (SUPER B COMPLEX PO) Take 1 tablet by mouth daily.  . calcium carbonate (OSCAL) 1500 (600 Ca) MG TABS tablet Take 600 mg of elemental calcium by mouth daily with breakfast.  . Cyanocobalamin 5000 MCG TBDP Take 5,000 mcg by mouth daily. Vitamin B12  . feeding supplement, ENSURE ENLIVE, (ENSURE ENLIVE) LIQD Take 237 mLs by mouth 2 (two) times daily between meals.  . furosemide (LASIX) 20 MG tablet TAKE 1 TABLET (20 MG TOTAL) BY MOUTH DAILY.  . metoprolol succinate (TOPROL-XL) 50 MG 24 hr tablet Take 50 mg by mouth 2 (two) times daily. Take with or immediately following a meal.  . Multiple Vitamins-Minerals (MULTIVITAMIN WITH MINERALS) tablet Take 1 tablet by mouth daily.  . Omega-3 Fatty Acids (FISH OIL PO) Take 1,400 mg by mouth at bedtime.  . pantoprazole (PROTONIX) 40 MG tablet Take 40 mg by mouth daily.  Marland Kitchen Propylene Glycol (SYSTANE BALANCE OP) Place 1 drop into both eyes 2 (two) times daily.  . Rivaroxaban (XARELTO) 15 MG  TABS tablet Take 15 mg by mouth daily.  . rosuvastatin (CRESTOR) 10 MG tablet Take 1 tablet (10 mg total) by mouth daily.  . tamsulosin (FLOMAX) 0.4 MG CAPS capsule Take 1 capsule (0.4 mg total) by mouth daily.     No Known Allergies  Social History   Socioeconomic History  . Marital status: Married    Spouse name: Not on file  . Number of children: 2  . Years of education: HS  . Highest  education level: Not on file  Occupational History  . Occupation: Retired  Tobacco Use  . Smoking status: Never Smoker  . Smokeless tobacco: Never Used  Substance and Sexual Activity  . Alcohol use: No  . Drug use: No  . Sexual activity: Yes  Other Topics Concern  . Not on file  Social History Narrative   Medical Decision Maker:  [name] (wife) Cell. [phone] House: [phone].   Daughter: Nigel Sloop: Cell [phone]   Full Code   Patient is Left handed.   Patient drinks 1 cup of caffeine daily.   Patient is left handed.   Social Determinants of Health   Financial Resource Strain:   . Difficulty of Paying Living Expenses:   Food Insecurity:   . Worried About Charity fundraiser in the Last Year:   . Arboriculturist in the Last Year:   Transportation Needs:   . Film/video editor (Medical):   Marland Kitchen Lack of Transportation (Non-Medical):   Physical Activity:   . Days of Exercise per Week:   . Minutes of Exercise per Session:   Stress:   . Feeling of Stress :   Social Connections:   . Frequency of Communication with Friends and Family:   . Frequency of Social Gatherings with Friends and Family:   . Attends Religious Services:   . Active Member of Clubs or Organizations:   . Attends Archivist Meetings:   Marland Kitchen Marital Status:   Intimate Partner Violence:   . Fear of Current or Ex-Partner:   . Emotionally Abused:   Marland Kitchen Physically Abused:   . Sexually Abused:      Review of Systems: General: negative for chills, fever, night sweats or weight changes.  Cardiovascular: negative for chest pain, dyspnea on exertion, edema, orthopnea, palpitations, paroxysmal nocturnal dyspnea or shortness of breath Dermatological: negative for rash Respiratory: negative for cough or wheezing Urologic: negative for hematuria Abdominal: negative for nausea, vomiting, diarrhea, bright red blood per rectum, melena, or hematemesis Neurologic: negative for visual  changes, syncope, or dizziness All other systems reviewed and are otherwise negative except as noted above.    Blood pressure 102/60, pulse (!) 51, height 5\' 8"  (1.727 m), weight 129 lb 12.8 oz (58.9 kg), SpO2 99 %.  General appearance: alert and no distress Neck: no adenopathy, no carotid bruit, no JVD, supple, symmetrical, trachea midline and thyroid not enlarged, symmetric, no tenderness/mass/nodules Lungs: clear to auscultation bilaterally Heart: 2-3 over 6 apical systolic murmur consistent with mitral gravitation Extremities: extremities normal, atraumatic, no cyanosis or edema Pulses: 2+ and symmetric Skin: Skin color, texture, turgor normal. No rashes or lesions Neurologic: Alert and oriented X 3, normal strength and tone. Normal symmetric reflexes. Normal coordination and gait  EKG sinus rhythm at 75 with PACs and LVH.  I personally reviewed this EKG.  ASSESSMENT AND PLAN:   PAF (paroxysmal atrial fibrillation) History of PAF on beta-blocker and Xarelto  Aortic valve insufficiency, moderate History of mild to moderate  AI on 2D echo 07/16/2019.  Essential hypertension History of essential hypertension blood pressure measured today at 102/60.  He is on metoprolol twice daily.  His heart rate is in the 50s.  I am going to cut this back to 25 mg twice a day from 50.  Hyperlipidemia History of hyperlipidemia on statin therapy with lipid profile performed 06/26/2019 revealing total cholesterol 107, LDL 48 and HDL 46.  Coronary artery disease due to lipid rich plaque History of minimal CAD by cardiac catheterization performed in 2012.  Left ventricular dysfunction Ejection fraction in the 35% range by recent 2D echo during a hospitalization in [place] earlier this month.  At that time he had severe MR and mild to moderate AI.  His ejection fraction by 2D echo 07/16/2019 in our lab was 35 to 40% with no evidence of much regurgitation.  I am going to repeat a 2D echo.  Does have a loud  apical systolic murmur.      Lorretta Harp MD FACP,FACC,FAHA, [facility] 09/10/2019 11:55 AM

## 2019-09-10 NOTE — Assessment & Plan Note (Signed)
History of essential hypertension blood pressure measured today at 102/60.  He is on metoprolol twice daily.  His heart rate is in the 50s.  I am going to cut this back to 25 mg twice a day from 50.

## 2019-09-12 ENCOUNTER — Telehealth: Payer: Self-pay | Admitting: Cardiovascular Disease

## 2019-09-12 NOTE — Telephone Encounter (Signed)
I spoke with patient's daughter and gave them information from Dr [name]

## 2019-09-12 NOTE — Telephone Encounter (Signed)
   Pt's daughter Arrie Aran trying to fill up pt assistance form and they have questions

## 2019-09-12 NOTE — Telephone Encounter (Signed)
Seen parameters for holding his medications as the higher dose.  I would have him call his PCP to discuss supplements for weight loss.

## 2019-09-12 NOTE — Telephone Encounter (Signed)
I spoke with patient's daughters. They have questions about proof of income needed for assistance form as patient does not file taxes and does not have Radio broadcast assistant.  They will call Xarelto assistance program to see what is required. They report patient was started on lopressor 50 mg twice daily in [place].  Instructions on bottle say to hold if top BP number is less than 100, bottom number less than 60 or heart rate less than 50.  Dr [name] decreased lopressor to 25 mg twice daily. Daughter is asking if patient should continue to follow these parameters or if Dr [name] recommends other parameters. Patient is losing weight.  He now has fluid and sodium restrictions.  They are asking if there are any supplements patient could take to help him gain weight that would not interfere with restrictions.

## 2019-09-13 ENCOUNTER — Other Ambulatory Visit: Payer: Self-pay | Admitting: *Deleted

## 2019-09-13 ENCOUNTER — Telehealth: Payer: Self-pay | Admitting: *Deleted

## 2019-09-13 ENCOUNTER — Telehealth: Payer: Self-pay

## 2019-09-13 MED ORDER — METOPROLOL SUCCINATE ER 25 MG PO TB24
ORAL_TABLET | ORAL | 3 refills | Status: DC
Start: 2019-09-13 — End: 2019-09-13

## 2019-09-13 MED ORDER — RIVAROXABAN 15 MG PO TABS: 15.0000 mg | ORAL_TABLET | Freq: Every day | ORAL | 3 refills | Status: DC

## 2019-09-13 NOTE — Telephone Encounter (Signed)
Xarelto Assistance has been faxed to [place]

## 2019-09-13 NOTE — Telephone Encounter (Signed)
Received a call from patient's daughter Richard Reed.Patient gave me permission to talk to her.[name] requested parameters for Metoprolol Succ 25 mg twice a day.She is concerned he is not taking enough.He only takes if B/P greater than 100/60 and pulse greater than 50.Stated he has only took 2 times this week.Stated this morning 109/51 pulse 60.He did not take due to diastolic B/P 51.She is also concerned he is losing weight.He weighed 131 lbs on 5/14.This morning he weighs 124 lbs 8 ounces.Stated he feels good.He is watching his sodium intake and no more than 50 ounces of fluid each day.She wanted Dr.Berry's advice on how to take Metoprolol and weight loss.Advised I will send message to him.

## 2019-09-13 NOTE — Telephone Encounter (Signed)
Can't advise re wgt lose. Needs F/U with PCP. Decrease Metop to 12.5 mg PO BID, same hold parameters

## 2019-09-13 NOTE — Telephone Encounter (Signed)
Spoke to patient's daughter Arrie Aran Dr.Berry's advice given.Advised to call back if B/P does not improve.

## 2019-09-13 NOTE — Telephone Encounter (Signed)
See previous 5/28 telephone message.

## 2019-09-15 DIAGNOSIS — I480 Paroxysmal atrial fibrillation: Secondary | ICD-10-CM | POA: Diagnosis not present

## 2019-09-15 DIAGNOSIS — J439 Emphysema, unspecified: Secondary | ICD-10-CM | POA: Diagnosis not present

## 2019-09-15 DIAGNOSIS — E7800 Pure hypercholesterolemia, unspecified: Secondary | ICD-10-CM | POA: Diagnosis not present

## 2019-09-15 DIAGNOSIS — Z7901 Long term (current) use of anticoagulants: Secondary | ICD-10-CM | POA: Diagnosis not present

## 2019-09-15 DIAGNOSIS — N185 Chronic kidney disease, stage 5: Secondary | ICD-10-CM | POA: Diagnosis not present

## 2019-09-15 DIAGNOSIS — Z9049 Acquired absence of other specified parts of digestive tract: Secondary | ICD-10-CM | POA: Diagnosis not present

## 2019-09-15 DIAGNOSIS — I2510 Atherosclerotic heart disease of native coronary artery without angina pectoris: Secondary | ICD-10-CM | POA: Diagnosis not present

## 2019-09-15 DIAGNOSIS — I351 Nonrheumatic aortic (valve) insufficiency: Secondary | ICD-10-CM | POA: Diagnosis not present

## 2019-09-15 DIAGNOSIS — I132 Hypertensive heart and chronic kidney disease with heart failure and with stage 5 chronic kidney disease, or end stage renal disease: Secondary | ICD-10-CM | POA: Diagnosis not present

## 2019-09-15 DIAGNOSIS — Z8546 Personal history of malignant neoplasm of prostate: Secondary | ICD-10-CM | POA: Diagnosis not present

## 2019-09-15 DIAGNOSIS — Z8673 Personal history of transient ischemic attack (TIA), and cerebral infarction without residual deficits: Secondary | ICD-10-CM | POA: Diagnosis not present

## 2019-09-15 DIAGNOSIS — Z7982 Long term (current) use of aspirin: Secondary | ICD-10-CM | POA: Diagnosis not present

## 2019-09-15 DIAGNOSIS — R5381 Other malaise: Secondary | ICD-10-CM | POA: Diagnosis not present

## 2019-09-15 DIAGNOSIS — Z9181 History of falling: Secondary | ICD-10-CM | POA: Diagnosis not present

## 2019-09-15 DIAGNOSIS — I5032 Chronic diastolic (congestive) heart failure: Secondary | ICD-10-CM | POA: Diagnosis not present

## 2019-09-19 ENCOUNTER — Other Ambulatory Visit (HOSPITAL_COMMUNITY): Payer: Medicare Other

## 2019-09-25 ENCOUNTER — Other Ambulatory Visit (HOSPITAL_COMMUNITY): Payer: Self-pay | Admitting: Interventional Radiology

## 2019-09-25 DIAGNOSIS — I671 Cerebral aneurysm, nonruptured: Secondary | ICD-10-CM

## 2019-09-30 ENCOUNTER — Other Ambulatory Visit: Payer: Self-pay

## 2019-09-30 ENCOUNTER — Other Ambulatory Visit (HOSPITAL_COMMUNITY): Payer: Medicare Other

## 2019-09-30 ENCOUNTER — Ambulatory Visit (HOSPITAL_COMMUNITY): Payer: Medicare Other | Attending: Cardiology

## 2019-09-30 DIAGNOSIS — I340 Nonrheumatic mitral (valve) insufficiency: Secondary | ICD-10-CM | POA: Insufficient documentation

## 2019-09-30 DIAGNOSIS — I351 Nonrheumatic aortic (valve) insufficiency: Secondary | ICD-10-CM | POA: Insufficient documentation

## 2019-09-30 DIAGNOSIS — I480 Paroxysmal atrial fibrillation: Secondary | ICD-10-CM

## 2019-10-07 ENCOUNTER — Telehealth: Payer: Self-pay | Admitting: Cardiovascular Disease

## 2019-10-07 ENCOUNTER — Other Ambulatory Visit: Payer: Self-pay

## 2019-10-07 ENCOUNTER — Ambulatory Visit (HOSPITAL_COMMUNITY)
Admission: RE | Admit: 2019-10-07 | Discharge: 2019-10-07 | Disposition: A | Discharge status: Home / Self Care | Payer: Medicare Other | Source: Ambulatory Visit | Attending: Interventional Radiology | Admitting: Interventional Radiology

## 2019-10-07 DIAGNOSIS — I6522 Occlusion and stenosis of left carotid artery: Secondary | ICD-10-CM | POA: Diagnosis not present

## 2019-10-07 DIAGNOSIS — I651 Occlusion and stenosis of basilar artery: Secondary | ICD-10-CM | POA: Diagnosis not present

## 2019-10-07 DIAGNOSIS — I671 Cerebral aneurysm, nonruptured: Secondary | ICD-10-CM | POA: Diagnosis not present

## 2019-10-07 DIAGNOSIS — I6503 Occlusion and stenosis of bilateral vertebral arteries: Secondary | ICD-10-CM | POA: Diagnosis not present

## 2019-10-07 LAB — POCT I-STAT CREATININE: Creatinine, Ser: 1.5 mg/dL — ABNORMAL HIGH (ref 0.61–1.24)

## 2019-10-07 MED ORDER — IOHEXOL 350 MG/ML SOLN
60.0000 mL | Freq: Once | INTRAVENOUS | Status: AC | PRN
  Administered 2019-10-07: 60 mL via INTRAVENOUS

## 2019-10-07 NOTE — Telephone Encounter (Signed)
Routed to primary nurse 

## 2019-10-07 NOTE — Telephone Encounter (Signed)
° °  Pt c/o medication issue:  1. Name of Medication: xarelto  2. How are you currently taking this medication (dosage and times per day)?   3. Are you having a reaction (difficulty breathing--STAT)?   4. What is your medication issue? Pt's daughter calling to follow up patient assistance for xarelto

## 2019-10-08 ENCOUNTER — Telehealth: Payer: Self-pay | Admitting: Cardiovascular Disease

## 2019-10-08 NOTE — Telephone Encounter (Signed)
He is okay to continue with both medications. If he has bleeding issues we can revisit this.

## 2019-10-08 NOTE — Telephone Encounter (Signed)
Pt c/o medication issue:  1. Name of Medication: aspirin EC 81 MG tablet  2. How are you currently taking this medication (dosage and times per day)? Once daily   3. Are you having a reaction (difficulty breathing--STAT)?  4. What is your medication issue? Patient has been on blood thinner (Rivaroxaban (XARELTO) 15 MG TABS tablet) for a few month now, daughter is wondering if he should still be taking his 81mg  aspirin.

## 2019-10-08 NOTE — Telephone Encounter (Signed)
Nigel Sloop (daughter), CALL AND D/W DAUGHTER she states that pt's family filled out a [employer] pt assistance form out and she was wondering the status of this. I will call them and see what the status is and call her back  Called and s/w Nevin Bloodgood at the pt assistance program. She states that [employer], this is the daughter, signed the form. Nevin Bloodgood states that if this is pt's POA  They will need a copy of this to continue with this application and determine the status. This will need to be faxed to  [facility] at [phone] when daughter brings this in.  Returned call to Nigel Sloop (daughter), informed her that we need a copy to fax to [place]. She states that she will try to bring copy of POA today or tomorrow.

## 2019-10-09 NOTE — Telephone Encounter (Signed)
See previous 10/09/19 telephone note.

## 2019-10-09 NOTE — Telephone Encounter (Signed)
Patient's daughter brought POA papers to office.POA papers faxed to [employer] at fax # [phone].

## 2019-10-14 ENCOUNTER — Telehealth (HOSPITAL_COMMUNITY): Payer: Self-pay

## 2019-10-14 NOTE — Telephone Encounter (Signed)
Spoke to pt's daughter, pt is not currently having any sx related to his aneurysm. She agreed to f/u in 6 months with cta head/neck. Will call if pt does develop new sx. AW

## 2019-10-17 ENCOUNTER — Telehealth: Payer: Self-pay | Admitting: Cardiovascular Disease

## 2019-10-17 DIAGNOSIS — D1801 Hemangioma of skin and subcutaneous tissue: Secondary | ICD-10-CM | POA: Diagnosis not present

## 2019-10-17 DIAGNOSIS — L814 Other melanin hyperpigmentation: Secondary | ICD-10-CM | POA: Diagnosis not present

## 2019-10-17 DIAGNOSIS — L905 Scar conditions and fibrosis of skin: Secondary | ICD-10-CM | POA: Diagnosis not present

## 2019-10-17 DIAGNOSIS — L821 Other seborrheic keratosis: Secondary | ICD-10-CM | POA: Diagnosis not present

## 2019-10-17 NOTE — Telephone Encounter (Signed)
Patient's daughter [name] called stating she received a letter from [employer] patient assistance about assistance for his Rivaroxaban (XARELTO) 15 MG TABS tablet. It said that his signature was missing from the application that was submitted.

## 2019-10-17 NOTE — Telephone Encounter (Signed)
**Note De-Identified  Obfuscation** I will forward this message to our [place] office as the pt sees Dr [name].

## 2019-10-18 DIAGNOSIS — Z7982 Long term (current) use of aspirin: Secondary | ICD-10-CM | POA: Diagnosis not present

## 2019-10-18 DIAGNOSIS — E7800 Pure hypercholesterolemia, unspecified: Secondary | ICD-10-CM | POA: Diagnosis not present

## 2019-10-18 DIAGNOSIS — Z7901 Long term (current) use of anticoagulants: Secondary | ICD-10-CM | POA: Diagnosis not present

## 2019-10-18 DIAGNOSIS — I5032 Chronic diastolic (congestive) heart failure: Secondary | ICD-10-CM | POA: Diagnosis not present

## 2019-10-18 DIAGNOSIS — I132 Hypertensive heart and chronic kidney disease with heart failure and with stage 5 chronic kidney disease, or end stage renal disease: Secondary | ICD-10-CM | POA: Diagnosis not present

## 2019-10-18 DIAGNOSIS — R5381 Other malaise: Secondary | ICD-10-CM | POA: Diagnosis not present

## 2019-10-18 DIAGNOSIS — Z9049 Acquired absence of other specified parts of digestive tract: Secondary | ICD-10-CM | POA: Diagnosis not present

## 2019-10-18 DIAGNOSIS — N185 Chronic kidney disease, stage 5: Secondary | ICD-10-CM | POA: Diagnosis not present

## 2019-10-18 DIAGNOSIS — J439 Emphysema, unspecified: Secondary | ICD-10-CM | POA: Diagnosis not present

## 2019-10-18 DIAGNOSIS — I2510 Atherosclerotic heart disease of native coronary artery without angina pectoris: Secondary | ICD-10-CM | POA: Diagnosis not present

## 2019-10-18 DIAGNOSIS — Z9181 History of falling: Secondary | ICD-10-CM | POA: Diagnosis not present

## 2019-10-18 DIAGNOSIS — I480 Paroxysmal atrial fibrillation: Secondary | ICD-10-CM | POA: Diagnosis not present

## 2019-10-18 DIAGNOSIS — Z8546 Personal history of malignant neoplasm of prostate: Secondary | ICD-10-CM | POA: Diagnosis not present

## 2019-10-18 DIAGNOSIS — I351 Nonrheumatic aortic (valve) insufficiency: Secondary | ICD-10-CM | POA: Diagnosis not present

## 2019-10-18 DIAGNOSIS — Z8673 Personal history of transient ischemic attack (TIA), and cerebral infarction without residual deficits: Secondary | ICD-10-CM | POA: Diagnosis not present

## 2019-10-30 NOTE — Telephone Encounter (Signed)
Spoke with the patients daughter, [name] who states that they recently received a letter from [employer] stating that a signature was missing from [place] assistance form. [name] has dropped off the form that was supposed to be signed by Dr. [name] and sent to [employer]. [name] is very worried because her father is almost out of the medication and he can not afford to get the Rx filled without the assistance. I let [name] know that I would leave samples at the front desk at [place] office in order for the patient to stay on the Xarelto until the above can be straightened out.

## 2019-10-30 NOTE — Telephone Encounter (Signed)
Patient's daughter calling to follow up the patient's xarelto assistance form. She states the patient has a week left of his medication.

## 2019-10-30 NOTE — Telephone Encounter (Signed)
Spoke to patient's daughter [name] advised patient assistance for Xarelto was faxed 09/13/19 and POA was faxed 10/08/19.I spoke to Avel Peace with [employer] patient assistance she stated they never received POA.She advised to refax POA to [phone] and [phone].POA refaxed to both #'s.

## 2019-11-04 NOTE — Telephone Encounter (Signed)
Patient assistance form re-faxed to the company.

## 2019-11-15 ENCOUNTER — Telehealth: Payer: Self-pay | Admitting: Cardiovascular Disease

## 2019-11-15 NOTE — Telephone Encounter (Signed)
Pt c/o medication issue:  1. Name of Medication: Rivaroxaban (XARELTO) 15 MG TABS tablet  2. How are you currently taking this medication (dosage and times per day)? Once daily  3. Are you having a reaction (difficulty breathing--STAT)? No   4. What is your medication issue? Daughter of the patient wanted to know if she should stop the medication for a day or not. The patient cut himself and has been bleeding for a few days, The bleeding has slowed down but his cut is still bruised around it . Please let the Daughter know what to do

## 2019-11-18 NOTE — Telephone Encounter (Signed)
Spoke with daughter - states he has a small cut on right forearm, it bled fairly steadily for about 2 days (within bandages).  Now has mostly cleared.  He was repeatedly tearing the scab open every time he would stand up out of his chair.    Advised daughter to hold Xarelto dose today if still having obvious bleeding.  Explained that if this happens again, they can hold Xarelto for a day to help decrease bleeding.  Daughter voiced understanding.

## 2019-12-04 ENCOUNTER — Other Ambulatory Visit: Payer: Self-pay | Admitting: Cardiovascular Disease

## 2019-12-04 NOTE — Telephone Encounter (Signed)
Rx has been sent to the pharmacy electronically. ° °

## 2019-12-11 ENCOUNTER — Ambulatory Visit: Payer: Medicare Other | Admitting: Cardiovascular Disease

## 2019-12-11 ENCOUNTER — Other Ambulatory Visit: Payer: Self-pay

## 2019-12-11 ENCOUNTER — Encounter: Payer: Self-pay | Admitting: Cardiovascular Disease

## 2019-12-11 DIAGNOSIS — I351 Nonrheumatic aortic (valve) insufficiency: Secondary | ICD-10-CM | POA: Diagnosis not present

## 2019-12-11 DIAGNOSIS — I480 Paroxysmal atrial fibrillation: Secondary | ICD-10-CM | POA: Diagnosis not present

## 2019-12-11 DIAGNOSIS — I519 Heart disease, unspecified: Secondary | ICD-10-CM

## 2019-12-11 DIAGNOSIS — E782 Mixed hyperlipidemia: Secondary | ICD-10-CM

## 2019-12-11 DIAGNOSIS — I10 Essential (primary) hypertension: Secondary | ICD-10-CM

## 2019-12-11 NOTE — Patient Instructions (Signed)
Medication Instructions:  Your physician recommends that you continue on your current medications as directed. Please refer to the Current Medication list given to you today.  *If you need a refill on your cardiac medications before your next appointment, please call your pharmacy*   Follow-Up: At [facility], you and your health needs are our priority.  As part of our continuing mission to provide you with exceptional heart care, we have created designated Provider Care Teams.  These Care Teams include your primary Cardiologist (physician) and Advanced Practice Providers (APPs -  Physician Assistants and Nurse Practitioners) who all work together to provide you with the care you need, when you need it.  We recommend signing up for the patient portal called "MyChart".  Sign up information is provided on this After Visit Summary.  MyChart is used to connect with patients for Virtual Visits (Telemedicine).  Patients are able to view lab/test results, encounter notes, upcoming appointments, etc.  Non-urgent messages can be sent to your provider as well.   To learn more about what you can do with MyChart, go to [email or web address].    Your next appointment:   6 month(s)  The format for your next appointment:   In Person  Provider:    Luke Kilroy, PA-C  Callie Goodrich, PA-C  Jesse Cleaver, FNP  12 months with Dr. Berry   

## 2019-12-11 NOTE — Assessment & Plan Note (Signed)
History of valvular heart disease with mild to moderate MR, AI and left ear with an EF of 40 to 45%.  He is essentially asymptomatic.

## 2019-12-11 NOTE — Progress Notes (Signed)
12/11/2019 Richard Reed   Jul 12, 1925  [id number]  Primary Physician Richard Loader, FNP Primary Cardiologist: Richard Harp MD Richard Reed, [place], [place]  HPI:  Richard Reed is a 86 y.o.   thin-appearing married Caucasian male, father of 109, grandfather to 10 grandchildren, who I last saw in the office  09/10/2019. He is accompanied by his daughter from [place], [place] today.Marland KitchenHe was hospitalized late 2012 with E. coli bacteremia and septic shock. He did develop some arrhythmias with PAF during that hospitalization. His EF was 45% to 50% with moderate aortic insufficiency. His other problems include hypertension and hyperlipidemia. I catheterized him in June 2012, revealing normal coronary arteries, with 40% to 60% ostial right coronary artery stenosis without damping. His right heart catheterization was unremarkable. He has had an uncomplicated laparoscopic cholecystectomy performed by Dr. Chucky Reed, April 12, 2011. His most recent lab work performed 04/25/16 revealed total cholesterol 99, LDL of 47 and HDL 39. He was admitted to the hospital in [place] in November of last year with congestive heart failure. He is begun on diuretics. He admits that this was probably related to dietary indiscretion. He did have an episode of syncope substantive thought to be related to dehydration. Workup included carotid Dopplers that were unremarkable. Cerebral angiography did reveal an MCA aneurysm as well as a stenosis at the vertebrobasilar junction demonstrated by angiography performed by Dr. Patrecia Reed. Medical therapy was recommended. A 2-D echo revealed low normal LV function with EF of 50%, mild AS and moderate AI with mild elevation of pulmonary artery pressures and Myoview stress test was low risk as well. Since adjusting his diet he has becomeforthe most part asymptomatic. He did see ChrisBerge NPin the office 07/20/16 which time he was cardiac stable what was complaining of some left hip pain with  ambulation. They did change the insoles and shoes which somewhat helped and try to statin holiday which did not impact his pain and therefore was placed back on statin therapy.  He  was hospitalized in [place] from 5/7 through the 13th of this month with pneumonia, A. fib and heart failure.  He was diuresed.  He is placed antibiotics.  2D echo revealed EF of 30% with severe MR.    When I saw him 3 months ago he had significantly improved compared to what he felt like during his hospitalization.  He continues to improve.  He is now walking with a cane.  He denies chest pain or shortness of breath.  Recent 2D echo performed 09/30/2019 revealed slight improvement in his EF up to 40 to 45% with mild to moderate MR, AI and aortic stenosis...    Current Meds  Medication Sig  . aspirin EC 81 MG tablet Take 81 mg by mouth daily.  . B Complex-C (SUPER B COMPLEX PO) Take 1 tablet by mouth daily.  . calcium carbonate (OSCAL) 1500 (600 Ca) MG TABS tablet Take 600 mg of elemental calcium by mouth daily with breakfast.  . Cyanocobalamin 5000 MCG TBDP Take 5,000 mcg by mouth daily. Vitamin B12  . feeding supplement, ENSURE ENLIVE, (ENSURE ENLIVE) LIQD Take 237 mLs by mouth 2 (two) times daily between meals.  . furosemide (LASIX) 20 MG tablet TAKE 1 TABLET (20 MG TOTAL) BY MOUTH DAILY.  . metoprolol succinate (TOPROL XL) 25 MG 24 hr tablet Take 1/2 tablet ( 12.5 mg ) twice a day  . Multiple Vitamins-Minerals (MULTIVITAMIN WITH MINERALS) tablet Take 1 tablet by mouth daily.  Marland Kitchen  Omega-3 Fatty Acids (FISH OIL PO) Take 1,400 mg by mouth at bedtime.  . pantoprazole (PROTONIX) 40 MG tablet Take 40 mg by mouth daily.  Marland Kitchen Propylene Glycol (SYSTANE BALANCE OP) Place 1 drop into both eyes 2 (two) times daily.  . Rivaroxaban (XARELTO) 15 MG TABS tablet Take 1 tablet (15 mg total) by mouth daily.  . rosuvastatin (CRESTOR) 10 MG tablet TAKE 1 TABLET (10 MG TOTAL) BY MOUTH DAILY.  . tamsulosin (FLOMAX) 0.4 MG CAPS capsule  Take 1 capsule (0.4 mg total) by mouth daily.     No Known Allergies  Social History   Socioeconomic History  . Marital status: Married    Spouse name: Not on file  . Number of children: 2  . Years of education: HS  . Highest education level: Not on file  Occupational History  . Occupation: Retired  Tobacco Use  . Smoking status: Never Smoker  . Smokeless tobacco: Never Used  Vaping Use  . Vaping Use: Never used  Substance and Sexual Activity  . Alcohol use: No  . Drug use: No  . Sexual activity: Yes  Other Topics Concern  . Not on file  Social History Narrative   Medical Decision Maker:  Richard Reed (wife) Cell. [phone] House: [phone].   Daughter: Richard Reed: Cell [phone]   Full Code   Patient is Left handed.   Patient drinks 1 cup of caffeine daily.   Patient is left handed.   Social Determinants of Health   Financial Resource Strain:   . Difficulty of Paying Living Expenses: Not on file  Food Insecurity:   . Worried About Charity fundraiser in the Last Year: Not on file  . Ran Out of Food in the Last Year: Not on file  Transportation Needs:   . Lack of Transportation (Medical): Not on file  . Lack of Transportation (Non-Medical): Not on file  Physical Activity:   . Days of Exercise per Week: Not on file  . Minutes of Exercise per Session: Not on file  Stress:   . Feeling of Stress : Not on file  Social Connections:   . Frequency of Communication with Friends and Family: Not on file  . Frequency of Social Gatherings with Friends and Family: Not on file  . Attends Religious Services: Not on file  . Active Member of Clubs or Organizations: Not on file  . Attends Archivist Meetings: Not on file  . Marital Status: Not on file  Intimate Partner Violence:   . Fear of Current or Ex-Partner: Not on file  . Emotionally Abused: Not on file  . Physically Abused: Not on file  . Sexually Abused: Not on file     Review of  Systems: General: negative for chills, fever, night sweats or weight changes.  Cardiovascular: negative for chest pain, dyspnea on exertion, edema, orthopnea, palpitations, paroxysmal nocturnal dyspnea or shortness of breath Dermatological: negative for rash Respiratory: negative for cough or wheezing Urologic: negative for hematuria Abdominal: negative for nausea, vomiting, diarrhea, bright red blood per rectum, melena, or hematemesis Neurologic: negative for visual changes, syncope, or dizziness All other systems reviewed and are otherwise negative except as noted above.    Blood pressure 124/76, pulse 76, height 5\' 7"  (1.702 m), weight 134 lb 3.2 oz (60.9 kg), SpO2 97 %.  General appearance: alert and no distress Neck: no adenopathy, no carotid bruit, no JVD, supple, symmetrical, trachea midline and thyroid not enlarged, symmetric, no tenderness/mass/nodules Lungs: clear  to auscultation bilaterally Heart: Soft outflow tract murmur and apical systolic murmur. Extremities: extremities normal, atraumatic, no cyanosis or edema Pulses: 2+ and symmetric Skin: Skin color, texture, turgor normal. No rashes or lesions Neurologic: Alert and oriented X 3, normal strength and tone. Normal symmetric reflexes. Normal coordination and gait  EKG not performed today  ASSESSMENT AND PLAN:   PAF (paroxysmal atrial fibrillation) History of PAF in the past currently on Xarelto oral anticoagulation.  Aortic valve insufficiency, moderate History of valvular heart disease with mild to moderate MR, AI and left ear with an EF of 40 to 45%.  He is essentially asymptomatic.  Essential hypertension History of essential hypertension a blood pressure measured today 124/76.  He is on metoprolol.  Hyperlipidemia History of hyperlipidemia on statin therapy with lipid profile performed 06/26/2019 revealing total cholesterol 107, LDL 48 and HDL 46.  Left ventricular dysfunction History of nonischemic  cardiomyopathy with an EF back in March 1930 5 to 40% now with increase in his EF up to 40 to 45% by 2D echo performed 09/30/2019.      Richard Harp MD FACP,FACC,FAHA, [facility] 12/11/2019 12:02 PM

## 2019-12-11 NOTE — Assessment & Plan Note (Signed)
History of hyperlipidemia on statin therapy with lipid profile performed 06/26/2019 revealing total cholesterol 107, LDL 48 and HDL 46.

## 2019-12-11 NOTE — Assessment & Plan Note (Signed)
History of nonischemic cardiomyopathy with an EF back in March 1930 5 to 40% now with increase in his EF up to 40 to 45% by 2D echo performed 09/30/2019.

## 2019-12-11 NOTE — Assessment & Plan Note (Signed)
History of PAF in the past currently on Xarelto oral anticoagulation.

## 2019-12-11 NOTE — Assessment & Plan Note (Signed)
History of essential hypertension a blood pressure measured today 124/76.  He is on metoprolol.

## 2019-12-18 ENCOUNTER — Telehealth: Payer: Self-pay | Admitting: Cardiovascular Disease

## 2019-12-18 NOTE — Telephone Encounter (Signed)
° ° °  Patient calling the office for samples of medication:   1.  What medication and dosage are you requesting samples for?  Rivaroxaban (XARELTO) 15 MG TABS tablet    2.  Are you currently out of this medication? Pt's daughter calling, she said she haven't heard anything about the patient assistance for xarelto. She said while waiting pt need another sample since he will ran out in 2 weeks and they will leave out of town soon.

## 2019-12-18 NOTE — Telephone Encounter (Signed)
Advised #2 lot # [id number] exp 11/21  [name] did inquire about patient assistance, they have still not heard back. Per [name] she spoke with someone that was going to follow up  Riki Sheer and gave TAX ID number, PTAN number not available and that was missing from application  Will continue with processing, advised [name]

## 2019-12-19 ENCOUNTER — Telehealth: Payer: Self-pay | Admitting: Cardiovascular Disease

## 2019-12-19 NOTE — Telephone Encounter (Signed)
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

## 2019-12-19 NOTE — Telephone Encounter (Signed)
Patient's daughter states the patient wants to start shooting his shot guns again at targets. She is concerned and would like to know if whether or not he could.

## 2019-12-23 NOTE — Telephone Encounter (Signed)
I have no problem with that as long as he doesn't shoot himself

## 2019-12-25 NOTE — Telephone Encounter (Signed)
Pt's daughter aware this is okay ./cy

## 2019-12-26 DIAGNOSIS — N401 Enlarged prostate with lower urinary tract symptoms: Secondary | ICD-10-CM | POA: Diagnosis not present

## 2019-12-26 DIAGNOSIS — Z8546 Personal history of malignant neoplasm of prostate: Secondary | ICD-10-CM | POA: Diagnosis not present

## 2019-12-26 DIAGNOSIS — N200 Calculus of kidney: Secondary | ICD-10-CM | POA: Diagnosis not present

## 2019-12-26 DIAGNOSIS — R3914 Feeling of incomplete bladder emptying: Secondary | ICD-10-CM | POA: Diagnosis not present

## 2019-12-30 ENCOUNTER — Other Ambulatory Visit: Payer: Self-pay | Admitting: Cardiovascular Disease

## 2019-12-30 NOTE — Telephone Encounter (Signed)
Patient calling the office for samples of medication:   1.  What medication and dosage are you requesting samples for? Xarelto     Are you currently out of this medication?  Need some until hie gets his from [employer]

## 2019-12-31 ENCOUNTER — Other Ambulatory Visit: Payer: Self-pay

## 2019-12-31 ENCOUNTER — Telehealth: Payer: Self-pay | Admitting: Cardiovascular Disease

## 2019-12-31 ENCOUNTER — Other Ambulatory Visit: Payer: Self-pay | Admitting: Cardiovascular Disease

## 2019-12-31 MED ORDER — RIVAROXABAN 15 MG PO TABS: 15.0000 mg | ORAL_TABLET | Freq: Every day | ORAL | 3 refills | Status: DC

## 2019-12-31 MED ORDER — RIVAROXABAN 15 MG PO TABS
15.0000 mg | ORAL_TABLET | Freq: Every day | ORAL | 1 refills | Status: DC
Start: 2019-12-31 — End: 2020-06-05

## 2019-12-31 NOTE — Telephone Encounter (Signed)
Pt wants to know why medication is so expensive if they got assistance and are their any other alternatives. Still paid over $500 for medicine. Please call to discuss

## 2019-12-31 NOTE — Telephone Encounter (Signed)
Called and spoke to Richard Reed. She is asking if there is an update on the PA for Xarelto. She states that she has turned everything required from [employer]. She states that he has a couple of weeks left of the medicine. Made her aware that I will forward to our PA nurse and temporary covering RN to see if she can check the status of this application.

## 2020-01-01 NOTE — Telephone Encounter (Signed)
**Note De-Identified  Obfuscation** Will forward to our [facility] office as the pt sees Dr [name] there and I have no knowledge of the pts application as it was more than likely left at that office.

## 2020-01-01 NOTE — Telephone Encounter (Signed)
Spoke to patient's daughter [name].She will call [employer] patient assistance to find out if Xarelto approved.She will call back if another application is needed.

## 2020-01-06 NOTE — Telephone Encounter (Signed)
Spoke to patient's daughter [name].She stated she called [employer] patient assistance and was told application still in process.She should hear about approval within the next week.  Daughter wanted to ask Dr.Berry if ok for father to go to [place].He will be traveling with her sister by car.Stated they plan to stop every 2 hours.Stated her father is doing good.Advised I will send message to [facility] for his advice.

## 2020-01-06 NOTE — Telephone Encounter (Signed)
Fine with me for him to go the [place] to visit family

## 2020-01-08 DIAGNOSIS — H00031 Abscess of right upper eyelid: Secondary | ICD-10-CM | POA: Diagnosis not present

## 2020-01-10 NOTE — Telephone Encounter (Signed)
Called patient's daughter [name] Dr.Berry's advice given.

## 2020-01-13 ENCOUNTER — Telehealth: Payer: Self-pay | Admitting: Cardiovascular Disease

## 2020-01-13 NOTE — Telephone Encounter (Signed)
Follow up:     Patient daughter call to check the status of the refill.   Patient calling the office for samples of medication:   1.  What medication and dosage are you requesting samples for? Xaertlo   2.  Are you currently out of this medication? One left

## 2020-01-13 NOTE — Telephone Encounter (Signed)
Spoke with the pts daughter [name] and she had called the pt assistance program and they advised her they have all the information they need and it is in process.. she says she will follow up with them again if we do not hear back by mid week.   Will leave Xarelto 15 mg samples at the front for the pt:   LOT [id number] EXP 3/23 #14

## 2020-01-20 DIAGNOSIS — H029 Unspecified disorder of eyelid: Secondary | ICD-10-CM | POA: Diagnosis not present

## 2020-02-07 ENCOUNTER — Other Ambulatory Visit: Payer: Self-pay | Admitting: Cardiovascular Disease

## 2020-02-10 ENCOUNTER — Other Ambulatory Visit: Payer: Self-pay | Admitting: Cardiovascular Disease

## 2020-02-10 DIAGNOSIS — Z23 Encounter for immunization: Secondary | ICD-10-CM | POA: Diagnosis not present

## 2020-02-11 DIAGNOSIS — H0231 Blepharochalasis right upper eyelid: Secondary | ICD-10-CM | POA: Diagnosis not present

## 2020-02-11 DIAGNOSIS — H02812 Retained foreign body in right lower eyelid: Secondary | ICD-10-CM | POA: Diagnosis not present

## 2020-02-11 DIAGNOSIS — H57813 Brow ptosis, bilateral: Secondary | ICD-10-CM | POA: Diagnosis not present

## 2020-02-11 DIAGNOSIS — H0234 Blepharochalasis left upper eyelid: Secondary | ICD-10-CM | POA: Diagnosis not present

## 2020-02-18 ENCOUNTER — Other Ambulatory Visit (HOSPITAL_COMMUNITY): Payer: Self-pay | Admitting: Urology

## 2020-03-02 DIAGNOSIS — H0234 Blepharochalasis left upper eyelid: Secondary | ICD-10-CM | POA: Diagnosis not present

## 2020-03-02 DIAGNOSIS — H57813 Brow ptosis, bilateral: Secondary | ICD-10-CM | POA: Diagnosis not present

## 2020-03-02 DIAGNOSIS — H02812 Retained foreign body in right lower eyelid: Secondary | ICD-10-CM | POA: Diagnosis not present

## 2020-03-02 DIAGNOSIS — H0231 Blepharochalasis right upper eyelid: Secondary | ICD-10-CM | POA: Diagnosis not present

## 2020-03-17 DIAGNOSIS — H57813 Brow ptosis, bilateral: Secondary | ICD-10-CM | POA: Diagnosis not present

## 2020-03-17 DIAGNOSIS — H0231 Blepharochalasis right upper eyelid: Secondary | ICD-10-CM | POA: Diagnosis not present

## 2020-03-17 DIAGNOSIS — H0234 Blepharochalasis left upper eyelid: Secondary | ICD-10-CM | POA: Diagnosis not present

## 2020-03-17 DIAGNOSIS — H00032 Abscess of right lower eyelid: Secondary | ICD-10-CM | POA: Diagnosis not present

## 2020-04-28 DIAGNOSIS — H9313 Tinnitus, bilateral: Secondary | ICD-10-CM | POA: Diagnosis not present

## 2020-04-28 DIAGNOSIS — Z570 Occupational exposure to noise: Secondary | ICD-10-CM | POA: Diagnosis not present

## 2020-04-28 DIAGNOSIS — H906 Mixed conductive and sensorineural hearing loss, bilateral: Secondary | ICD-10-CM | POA: Diagnosis not present

## 2020-04-28 DIAGNOSIS — Z822 Family history of deafness and hearing loss: Secondary | ICD-10-CM | POA: Diagnosis not present

## 2020-05-26 ENCOUNTER — Other Ambulatory Visit (HOSPITAL_COMMUNITY): Payer: Self-pay | Admitting: Interventional Radiology

## 2020-05-26 ENCOUNTER — Telehealth (HOSPITAL_COMMUNITY): Payer: Self-pay

## 2020-05-26 DIAGNOSIS — I671 Cerebral aneurysm, nonruptured: Secondary | ICD-10-CM

## 2020-05-26 NOTE — Telephone Encounter (Signed)
Called to schedule f/u cta head/neck. Pt's daughter would like to discuss with family to decide if they want to continue f/u's. Pt is 64 and not sure if they want to continue to put him through these f/u's at his age. She will call back with decision. AW

## 2020-06-05 ENCOUNTER — Other Ambulatory Visit: Payer: Self-pay | Admitting: Cardiovascular Disease

## 2020-06-05 NOTE — Telephone Encounter (Signed)
Prescription refill request for Xarelto received.  Indication:atrial fibrillation Last office visit:8/21 berry Weight:60.9 kg Age:87 Scr:1.5  6/21 CrCl:27.07 ml/min  Prescription refilled

## 2020-06-09 ENCOUNTER — Ambulatory Visit: Payer: Medicare Other | Admitting: Cardiology

## 2020-06-09 ENCOUNTER — Encounter: Payer: Self-pay | Admitting: Cardiology

## 2020-06-09 ENCOUNTER — Other Ambulatory Visit: Payer: Self-pay

## 2020-06-09 VITALS — BP 132/64 | HR 80 | Ht 67.0 in | Wt 134.6 lb

## 2020-06-09 DIAGNOSIS — I480 Paroxysmal atrial fibrillation: Secondary | ICD-10-CM | POA: Diagnosis not present

## 2020-06-09 DIAGNOSIS — Z7901 Long term (current) use of anticoagulants: Secondary | ICD-10-CM

## 2020-06-09 DIAGNOSIS — C61 Malignant neoplasm of prostate: Secondary | ICD-10-CM

## 2020-06-09 DIAGNOSIS — N1831 Chronic kidney disease, stage 3a: Secondary | ICD-10-CM | POA: Diagnosis not present

## 2020-06-09 DIAGNOSIS — Z8673 Personal history of transient ischemic attack (TIA), and cerebral infarction without residual deficits: Secondary | ICD-10-CM

## 2020-06-09 DIAGNOSIS — E785 Hyperlipidemia, unspecified: Secondary | ICD-10-CM

## 2020-06-09 DIAGNOSIS — I2510 Atherosclerotic heart disease of native coronary artery without angina pectoris: Secondary | ICD-10-CM | POA: Diagnosis not present

## 2020-06-09 DIAGNOSIS — I10 Essential (primary) hypertension: Secondary | ICD-10-CM

## 2020-06-09 DIAGNOSIS — I519 Heart disease, unspecified: Secondary | ICD-10-CM

## 2020-06-09 DIAGNOSIS — I2583 Coronary atherosclerosis due to lipid rich plaque: Secondary | ICD-10-CM

## 2020-06-09 NOTE — Patient Instructions (Signed)

## 2020-06-09 NOTE — Assessment & Plan Note (Signed)
On Xarelto 15 mg-CHADS VASC=6

## 2020-06-09 NOTE — Assessment & Plan Note (Signed)
EF 40-45% with mod MR June 2021- no CHF by history or exam

## 2020-06-09 NOTE — Progress Notes (Signed)
Cardiology Office Note:    Date:  06/09/2020   ID:  Richard Reed, DOB 11-24-1925, MRN [id number]  PCP:  [name], FNP  Cardiologist:  Quay Burow, MD  Electrophysiologist:  None   Referring MD: [name], FNP   No chief complaint on file.   History of Present Illness:    Richard Reed is a 87 y.o. male with a hx of PAF in the past in the setting of urosepsis.  He is on chronic Xarelto. Echo in June 2021 showed an EF of 40-45% with moderate MR and mild AS.  He is in the office today for 74-month checkup.  His daughter accompanied him.  The patient is doing well.  He is going to [place] again for a few weeks.  He is active for his age.  He is limited mainly by his site and he is hard of hearing.  He has not had syncope or tachycardia.  Past Medical History:  Diagnosis Date  . Angina   . Aortic valve insufficiency, moderate    a. 02/2016 Echo: mild AS, mild to mod AI.  Marland Kitchen Arthritis   . Arthritis pain   . CAD (coronary artery disease)    a. 09/2010 Cath: RCA 40-60 ost, otw nl cors;  b. 03/2016 Lexiscan MV: EF 46%, no ischemia, low risk.  . Cancer ([place])   . Carotid disease, bilateral ([place])    a. 02/2016 Carotid U/S: 1-39% bilat ICA plaquing.  . Cerebral aneurysm    a. 02/2016 MRA: fusiform bilobed aneurysm @ R MCA bifurcation.  . Cerebrovascular disease    a. 02/2016 MRA: long segment narrowing of basilar artery, moderate bilat posterior inferior cerebellar artery dzs.  . Chronic combined systolic and diastolic CHF (congestive heart failure) ([place])    a. 02/2016 Admitted to hospital in [place] w/ CHF;  b. 02/2016 Echo ([facility]): EF 45-50%, diff HK, gr1DD, mild AS, mild to mod AI, mildly dil Ao root, PASP 70mmHg.  . CKD (chronic kidney disease), stage III ([place])   . Dysrhythmia   . Essential hypertension   . Hard of hearing   . Hemiparesis and alteration of sensations as late effects of stroke ([place]) 04/29/2015  . Hiatal hernia   . History of kidney stones   . HOH (hard  of hearing)   . Hyperlipidemia   . Kidney stones   . Moderate aortic insufficiency   . NICM (nonischemic cardiomyopathy) ([place])    a. 02/2016 Echo: EF 45-50%, diff HK;  b. 03/2016 Lexiscan MV: No ischemia, EF 46%.  Marland Kitchen NSVT (nonsustained ventricular tachycardia) ([place])    a. 03/2016 noted on event monitor.  Marland Kitchen PAF (paroxysmal atrial fibrillation) ([place])    a. 2012 - noted in setting of E coli bacteremia and septic shock.  . Pneumonia 03/23/2011   "first time"  . Prostate cancer ([place])   . Sepsis due to Escherichia coli ([place]) 03/24/11  . Syncope    a. 02/2016 presumed to be 2/2 hypotension/dehydration and basilar artery dzs noted on MRA.  Marland Kitchen TIA (transient ischemic attack)   . Vertebrobasilar insufficiency 04/28/2016  . Visual disturbance 03/19/2014    Past Surgical History:  Procedure Laterality Date  . CARDIAC CATHETERIZATION  09/2010   non obstructive CAD, 40-60% ostial RCA stenosis without dampening   . CATARACT EXTRACTION Bilateral   . cerebral angiography    . CHOLECYSTECTOMY  04/15/2011   Procedure: LAPAROSCOPIC CHOLECYSTECTOMY WITH INTRAOPERATIVE CHOLANGIOGRAM;  Surgeon: Belva Crome, MD;  Location: [facility]  OR;  Service: General;  Laterality: N/A;  . EXTRACORPOREAL SHOCK WAVE LITHOTRIPSY Left 12/12/2016   Procedure: LEFT EXTRACORPOREAL SHOCK WAVE LITHOTRIPSY (ESWL);  Surgeon: Ardis Hughs, MD;  Location: [facility] ORS;  Service: Urology;  Laterality: Left;  . IR ANGIO INTRA EXTRACRAN SEL COM CAROTID INNOMINATE BILAT MOD SED  03/21/2019  . IR ANGIO VERTEBRAL SEL SUBCLAVIAN INNOMINATE BILAT MOD SED  03/21/2019  . IR GENERIC HISTORICAL  03/17/2016   IR ANGIO VERTEBRAL SEL SUBCLAVIAN INNOMINATE BILAT MOD SED 03/17/2016 [name], MD [facility]  . IR GENERIC HISTORICAL  03/17/2016   IR ANGIO INTRA EXTRACRAN SEL COM CAROTID INNOMINATE BILAT MOD SED 03/17/2016 [name], MD [facility]  . PROSTATE SURGERY     approx 10 years ago, seed implant radiation  . TONSILLECTOMY       Current Medications: Current Meds  Medication Sig  . aspirin EC 81 MG tablet Take 81 mg by mouth daily.  . B Complex-C (SUPER B COMPLEX PO) Take 1 tablet by mouth daily.  . calcium carbonate (OSCAL) 1500 (600 Ca) MG TABS tablet Take 600 mg of elemental calcium by mouth daily with breakfast.  . Cyanocobalamin 5000 MCG TBDP Take 5,000 mcg by mouth daily. Vitamin B12  . feeding supplement, ENSURE ENLIVE, (ENSURE ENLIVE) LIQD Take 237 mLs by mouth 2 (two) times daily between meals.  . metoprolol succinate (TOPROL XL) 25 MG 24 hr tablet Take 1/2 tablet ( 12.5 mg ) twice a day  . Multiple Vitamins-Minerals (MULTIVITAMIN WITH MINERALS) tablet Take 1 tablet by mouth daily.  . Omega-3 Fatty Acids (FISH OIL PO) Take 1,400 mg by mouth at bedtime.  . pantoprazole (PROTONIX) 40 MG tablet Take 40 mg by mouth daily.  Marland Kitchen Propylene Glycol (SYSTANE BALANCE OP) Place 1 drop into both eyes 2 (two) times daily.  . rosuvastatin (CRESTOR) 10 MG tablet TAKE 1 TABLET (10 MG TOTAL) BY MOUTH DAILY.  . tamsulosin (FLOMAX) 0.4 MG CAPS capsule Take 1 capsule (0.4 mg total) by mouth daily.  Alveda Reasons 15 MG TABS tablet TAKE 1 TABLET (15 MG TOTAL) BY MOUTH DAILY WITH SUPPER.     Allergies:   Patient has no known allergies.   Social History   Socioeconomic History  . Marital status: Married    Spouse name: Not on file  . Number of children: 2  . Years of education: HS  . Highest education level: Not on file  Occupational History  . Occupation: Retired  Tobacco Use  . Smoking status: Never Smoker  . Smokeless tobacco: Never Used  Vaping Use  . Vaping Use: Never used  Substance and Sexual Activity  . Alcohol use: No  . Drug use: No  . Sexual activity: Yes  Other Topics Concern  . Not on file  Social History Narrative   Medical Decision Maker:  TEODOR PRATER (wife) Cell. [phone] House: [phone].   Daughter: Nigel Sloop: Cell [phone]   Full Code   Patient is Left handed.   Patient  drinks 1 cup of caffeine daily.   Patient is left handed.   Social Determinants of Health   Financial Resource Strain: Not on file  Food Insecurity: Not on file  Transportation Needs: Not on file  Physical Activity: Not on file  Stress: Not on file  Social Connections: Not on file     Family History: The patient's family history includes Cancer in his brother; Heart disease in his father and mother; Heart failure in his mother.  ROS:  Please see the history of present illness.     All other systems reviewed and are negative.  EKGs/Labs/Other Studies Reviewed:    The following studies were reviewed today: Echo 09/30/2019- IMPRESSIONS    1. Left ventricular ejection fraction, by estimation, is 40 to 45%. The  left ventricle has mildly decreased function. The left ventricle  demonstrates global hypokinesis. The left ventricular internal cavity size  was moderately dilated. There is mild  left ventricular hypertrophy of the basal-septal segment. Left ventricular  diastolic parameters are consistent with Grade I diastolic dysfunction  (impaired relaxation). Elevated left ventricular end-diastolic pressure.  2. Right ventricular systolic function is normal. The right ventricular  size is normal. There is normal pulmonary artery systolic pressure.  3. Left atrial size was severely dilated.  4. The mitral valve is degenerative. Mild to moderate mitral valve  regurgitation. No evidence of mitral stenosis.  5. The aortic valve is tricuspid. Aortic valve regurgitation is moderate.  Moderate aortic valve stenosis. Aortic regurgitation PHT measures 410  msec. Aortic valve area, by VTI measures 1.23 cm. Aortic valve mean  gradient measures 22.0 mmHg. Aortic  valve Vmax measures 3.03 m/s.  6. Aortic dilatation noted. There is mild dilatation of the aortic root  and moderate dilatation of the ascending aorta measuring 41 mm and 31mm.  7. The inferior vena cava is normal in size  with greater than 50%  respiratory variability, suggesting right atrial pressure of 3 mmHg.   EKG:  EKG is  ordered today.  The ekg ordered today demonstrates NSR, PACs, PVCs  Recent Labs: 07/12/2019: BNP 525.6; Hemoglobin 15.0; Platelets 122 07/30/2019: BUN 19; Potassium 4.5; Sodium 140 10/07/2019: Creatinine, Ser 1.50  Recent Lipid Panel    Component Value Date/Time   CHOL 97 (L) 05/22/2017 1056   TRIG 66 05/22/2017 1056   HDL 39 (L) 05/22/2017 1056   CHOLHDL 2.5 05/22/2017 1056   CHOLHDL 2.5 04/25/2016 0813   VLDL 13 04/25/2016 0813   LDLCALC 45 05/22/2017 1056    Physical Exam:    VS:  BP 132/64 (BP Location: Left Arm, Patient Position: Sitting)   Pulse 80   Ht 5\' 7"  (1.702 m)   Wt 134 lb 9.6 oz (61.1 kg)   SpO2 98%   BMI 21.08 kg/m     Wt Readings from Last 3 Encounters:  06/09/20 134 lb 9.6 oz (61.1 kg)  12/11/19 134 lb 3.2 oz (60.9 kg)  09/10/19 129 lb 12.8 oz (58.9 kg)     GEN: Well nourished, well developed in no acute distress HEENT: Normal-HOH NECK: No JVD CARDIAC: RRR, 2/6 systolic murmur, no rubs, gallops RESPIRATORY:  Clear to auscultation without rales, wheezing or rhonchi  ABDOMEN: Soft, non-tender, non-distended MUSCULOSKELETAL:  No edema; No deformity  SKIN: Warm and dry NEUROLOGIC:  Alert and oriented x 3 PSYCHIATRIC:  Normal affect   ASSESSMENT:    PAF (paroxysmal atrial fibrillation) Holding NSR with PACs and PVCs  Left ventricular dysfunction EF 40-45% with mod MR June 2021- no CHF by history or exam  Chronic anticoagulation On Xarelto 15 mg-CHADS VASC=6  PLAN:    Same Rx- f/u Dr [name] in 6 months   Medication Adjustments/Labs and Tests Ordered: Current medicines are reviewed at length with the patient today.  Concerns regarding medicines are outlined above.  Orders Placed This Encounter  Procedures  . EKG 12-Lead   No orders of the defined types were placed in this encounter.   Patient Instructions  Medication Instructions:  Continue current medications  *If you need a refill on your cardiac medications before your next appointment, please call your pharmacy*   Lab Work: None Ordered   Testing/Procedures: None Ordered   Follow-Up: At [facility], you and your health needs are our priority.  As part of our continuing mission to provide you with exceptional heart care, we have created designated Provider Care Teams.  These Care Teams include your primary Cardiologist (physician) and Advanced Practice Providers (APPs -  Physician Assistants and Nurse Practitioners) who all work together to provide you with the care you need, when you need it.  We recommend signing up for the patient portal called "MyChart".  Sign up information is provided on this After Visit Summary.  MyChart is used to connect with patients for Virtual Visits (Telemedicine).  Patients are able to view lab/test results, encounter notes, upcoming appointments, etc.  Non-urgent messages can be sent to your provider as well.   To learn more about what you can do with MyChart, go to [email or web address].    Your next appointment:   6 month(s)  The format for your next appointment:   In Person  Provider:   You may see Quay Burow, MD or one of the following Advanced Practice Providers on your designated Care Team:    Kerin Ransom, PA-C  [place], [place]  Coletta Memos, FNP       Signed, Kerin Ransom, [place]  06/09/2020 2:04 PM    [place]

## 2020-06-09 NOTE — Assessment & Plan Note (Signed)
Holding NSR with PACs and PVCs

## 2020-07-08 DIAGNOSIS — I2510 Atherosclerotic heart disease of native coronary artery without angina pectoris: Secondary | ICD-10-CM | POA: Diagnosis not present

## 2020-07-08 DIAGNOSIS — Z125 Encounter for screening for malignant neoplasm of prostate: Secondary | ICD-10-CM | POA: Diagnosis not present

## 2020-07-08 DIAGNOSIS — Z8546 Personal history of malignant neoplasm of prostate: Secondary | ICD-10-CM | POA: Diagnosis not present

## 2020-07-08 DIAGNOSIS — N1832 Chronic kidney disease, stage 3b: Secondary | ICD-10-CM | POA: Diagnosis not present

## 2020-07-08 DIAGNOSIS — Z0000 Encounter for general adult medical examination without abnormal findings: Secondary | ICD-10-CM | POA: Diagnosis not present

## 2020-07-10 ENCOUNTER — Telehealth: Payer: Self-pay | Admitting: Cardiovascular Disease

## 2020-07-10 NOTE — Telephone Encounter (Signed)
Faxed over notes via epic to PCP.

## 2020-07-10 NOTE — Telephone Encounter (Signed)
Patient's daughter states the patient recently saw Genelle Bal, MSN, FNP-BC and he requested a copy of the patient's last office visit notes.

## 2020-07-28 ENCOUNTER — Other Ambulatory Visit (HOSPITAL_COMMUNITY): Payer: Self-pay

## 2020-07-28 DIAGNOSIS — H00031 Abscess of right upper eyelid: Secondary | ICD-10-CM | POA: Diagnosis not present

## 2020-07-28 MED ORDER — DOXYCYCLINE HYCLATE 100 MG PO TABS
100.0000 mg | ORAL_TABLET | Freq: Two times a day (BID) | ORAL | 0 refills | Status: DC
  Filled 2020-07-28: qty 20, 10d supply, fill #0

## 2020-07-28 MED ORDER — POLYMYXIN B-TRIMETHOPRIM 10000-0.1 UNIT/ML-% OP SOLN
OPHTHALMIC | 0 refills | Status: DC
  Filled 2020-07-28: qty 10, 30d supply, fill #0

## 2020-08-28 DIAGNOSIS — H0231 Blepharochalasis right upper eyelid: Secondary | ICD-10-CM | POA: Diagnosis not present

## 2020-08-28 DIAGNOSIS — H0234 Blepharochalasis left upper eyelid: Secondary | ICD-10-CM | POA: Diagnosis not present

## 2020-08-28 DIAGNOSIS — H57813 Brow ptosis, bilateral: Secondary | ICD-10-CM | POA: Diagnosis not present

## 2020-08-28 DIAGNOSIS — H02812 Retained foreign body in right lower eyelid: Secondary | ICD-10-CM | POA: Diagnosis not present

## 2020-09-07 ENCOUNTER — Other Ambulatory Visit (HOSPITAL_COMMUNITY): Payer: Self-pay

## 2020-09-07 MED FILL — Rivaroxaban Tab 15 MG: ORAL | 60 days supply | Qty: 60 | Fill #0 | Status: AC

## 2020-09-07 MED FILL — Tamsulosin HCl Cap 0.4 MG: ORAL | 30 days supply | Qty: 30 | Fill #0 | Status: AC

## 2020-09-07 MED FILL — Furosemide Tab 20 MG: ORAL | 90 days supply | Qty: 90 | Fill #0 | Status: AC

## 2020-09-07 MED FILL — Metoprolol Succinate Tab ER 24HR 25 MG (Tartrate Equiv): ORAL | 90 days supply | Qty: 90 | Fill #0 | Status: AC

## 2020-09-08 ENCOUNTER — Other Ambulatory Visit (HOSPITAL_COMMUNITY): Payer: Self-pay

## 2020-09-10 DIAGNOSIS — H0231 Blepharochalasis right upper eyelid: Secondary | ICD-10-CM | POA: Diagnosis not present

## 2020-09-10 DIAGNOSIS — H57813 Brow ptosis, bilateral: Secondary | ICD-10-CM | POA: Diagnosis not present

## 2020-09-10 DIAGNOSIS — L980 Pyogenic granuloma: Secondary | ICD-10-CM | POA: Diagnosis not present

## 2020-09-10 DIAGNOSIS — H0234 Blepharochalasis left upper eyelid: Secondary | ICD-10-CM | POA: Diagnosis not present

## 2020-10-01 ENCOUNTER — Other Ambulatory Visit (HOSPITAL_COMMUNITY): Payer: Self-pay

## 2020-10-06 ENCOUNTER — Other Ambulatory Visit (HOSPITAL_COMMUNITY): Payer: Self-pay

## 2020-10-06 DIAGNOSIS — L820 Inflamed seborrheic keratosis: Secondary | ICD-10-CM | POA: Diagnosis not present

## 2020-10-06 DIAGNOSIS — D225 Melanocytic nevi of trunk: Secondary | ICD-10-CM | POA: Diagnosis not present

## 2020-10-06 DIAGNOSIS — L218 Other seborrheic dermatitis: Secondary | ICD-10-CM | POA: Diagnosis not present

## 2020-10-06 DIAGNOSIS — L570 Actinic keratosis: Secondary | ICD-10-CM | POA: Diagnosis not present

## 2020-10-06 MED FILL — Rosuvastatin Calcium Tab 10 MG: ORAL | 90 days supply | Qty: 90 | Fill #0 | Status: AC

## 2020-10-06 MED FILL — Tamsulosin HCl Cap 0.4 MG: ORAL | 90 days supply | Qty: 90 | Fill #1 | Status: AC

## 2020-10-07 ENCOUNTER — Other Ambulatory Visit (HOSPITAL_COMMUNITY): Payer: Self-pay

## 2020-10-10 DIAGNOSIS — I10 Essential (primary) hypertension: Secondary | ICD-10-CM | POA: Diagnosis not present

## 2020-10-10 DIAGNOSIS — I2510 Atherosclerotic heart disease of native coronary artery without angina pectoris: Secondary | ICD-10-CM | POA: Diagnosis not present

## 2020-10-10 DIAGNOSIS — N185 Chronic kidney disease, stage 5: Secondary | ICD-10-CM | POA: Diagnosis not present

## 2020-10-10 DIAGNOSIS — I5032 Chronic diastolic (congestive) heart failure: Secondary | ICD-10-CM | POA: Diagnosis not present

## 2020-10-13 ENCOUNTER — Other Ambulatory Visit (HOSPITAL_COMMUNITY): Payer: Self-pay

## 2020-10-13 MED FILL — Rivaroxaban Tab 15 MG: ORAL | 60 days supply | Qty: 60 | Fill #1 | Status: CN

## 2020-10-22 ENCOUNTER — Other Ambulatory Visit (HOSPITAL_COMMUNITY): Payer: Self-pay

## 2020-10-22 MED FILL — Rivaroxaban Tab 15 MG: ORAL | 60 days supply | Qty: 60 | Fill #1 | Status: AC

## 2020-12-08 ENCOUNTER — Other Ambulatory Visit (HOSPITAL_COMMUNITY): Payer: Self-pay

## 2020-12-08 ENCOUNTER — Ambulatory Visit: Payer: Medicare Other | Admitting: Cardiovascular Disease

## 2020-12-08 ENCOUNTER — Other Ambulatory Visit: Payer: Self-pay

## 2020-12-08 ENCOUNTER — Encounter: Payer: Self-pay | Admitting: Cardiovascular Disease

## 2020-12-08 DIAGNOSIS — I351 Nonrheumatic aortic (valve) insufficiency: Secondary | ICD-10-CM | POA: Diagnosis not present

## 2020-12-08 DIAGNOSIS — I519 Heart disease, unspecified: Secondary | ICD-10-CM | POA: Diagnosis not present

## 2020-12-08 DIAGNOSIS — I480 Paroxysmal atrial fibrillation: Secondary | ICD-10-CM | POA: Diagnosis not present

## 2020-12-08 DIAGNOSIS — I2510 Atherosclerotic heart disease of native coronary artery without angina pectoris: Secondary | ICD-10-CM | POA: Diagnosis not present

## 2020-12-08 DIAGNOSIS — I2583 Coronary atherosclerosis due to lipid rich plaque: Secondary | ICD-10-CM

## 2020-12-08 MED ORDER — METOPROLOL SUCCINATE ER 25 MG PO TB24
ORAL_TABLET | ORAL | 3 refills | Status: DC
  Filled 2020-12-08 (×2): qty 90, 60d supply, fill #0
  Filled 2021-02-01: qty 90, 60d supply, fill #1
  Filled 2021-03-29: qty 90, 60d supply, fill #2
  Filled 2021-06-01 (×2): qty 90, 60d supply, fill #3
  Filled 2021-06-01: qty 90, 60d supply, fill #0

## 2020-12-08 MED FILL — Rivaroxaban Tab 15 MG: ORAL | 60 days supply | Qty: 60 | Fill #2 | Status: CN

## 2020-12-08 MED FILL — Rivaroxaban Tab 15 MG: ORAL | 60 days supply | Qty: 60 | Fill #0 | Status: AC

## 2020-12-08 NOTE — Progress Notes (Signed)
12/08/2020 Richard Reed   13-Aug-1925  [id number]  Primary Physician [name], FNP Primary Cardiologist: Lorretta Harp MD Garret Reddish, [place], [place]  HPI:  Richard Reed is a 87 y.o.   thin-appearing married Caucasian male, father of 33, grandfather to 10 grandchildren, who I last saw in the office 12/11/2019. He is accompanied by his daughter Richard Reed.  He was hospitalized late 2012 with E. coli bacteremia and septic shock. He did develop some arrhythmias with PAF during that hospitalization. His EF was 45% to 50% with moderate aortic insufficiency. His other problems include hypertension and hyperlipidemia. I catheterized him in June 2012, revealing normal coronary arteries, with 40% to 60% ostial right coronary artery stenosis without damping. His right heart catheterization was unremarkable. He has had an uncomplicated laparoscopic cholecystectomy performed by Dr. Chucky May, April 12, 2011. His most recent lab work performed 04/25/16 revealed total cholesterol 99, LDL of 47 and HDL 39. He was admitted to the hospital in [place] in November of last year with congestive heart failure. He is begun on diuretics. He admits that this was probably related to dietary indiscretion. He did have an episode of syncope substantive thought to be related to dehydration. Workup included carotid Dopplers that were unremarkable. Cerebral angiography did reveal an MCA aneurysm as well as a stenosis at the vertebrobasilar junction demonstrated by angiography performed by Dr. Patrecia Pour. Medical therapy was recommended. A 2-D echo revealed low normal LV function with EF of 50%, mild AS and moderate AI with mild elevation of pulmonary artery pressures and Myoview stress test was low risk as well. Since adjusting his diet he has become for  the most part asymptomatic. He did see Ignacia Bayley NP in the office 07/20/16 which time he was cardiac stable what was complaining of some left hip pain with ambulation. They did  change the insoles and shoes which somewhat helped and try to statin holiday which did not impact his pain and therefore was placed back on statin therapy.   He  was hospitalized in [place] from 5/7 through the 13th of this month with pneumonia, A. fib and heart failure.  He was diuresed.  He is placed antibiotics.  2D echo revealed EF of 30% with severe MR.     Since I saw him a year ago he continues to do well.  He is still living independently with his wife.  He is ambulatory, denying chest pain or shortness of breath..   Current Meds  Medication Sig   aspirin EC 81 MG tablet Take 81 mg by mouth daily.   B Complex-C (SUPER B COMPLEX PO) Take 1 tablet by mouth daily.   calcium carbonate (OSCAL) 1500 (600 Ca) MG TABS tablet Take 600 mg of elemental calcium by mouth daily with breakfast.   Cyanocobalamin 5000 MCG TBDP Take 5,000 mcg by mouth daily. Vitamin B12   doxycycline (VIBRA-TABS) 100 MG tablet Take 1 tablet by mouth twice a day   feeding supplement, ENSURE ENLIVE, (ENSURE ENLIVE) LIQD Take 237 mLs by mouth 2 (two) times daily between meals.   furosemide (LASIX) 20 MG tablet TAKE 1 TABLET (20 MG TOTAL) BY MOUTH DAILY.   Multiple Vitamins-Minerals (MULTIVITAMIN WITH MINERALS) tablet Take 1 tablet by mouth daily.   Omega-3 Fatty Acids (FISH OIL PO) Take 1,400 mg by mouth at bedtime.   pantoprazole (PROTONIX) 40 MG tablet Take 40 mg by mouth daily.   Propylene Glycol (SYSTANE BALANCE OP) Place 1 drop into  both eyes 2 (two) times daily.   Rivaroxaban (XARELTO) 15 MG TABS tablet TAKE 1 TABLET (15 MG TOTAL) BY MOUTH DAILY WITH SUPPER.   rosuvastatin (CRESTOR) 10 MG tablet TAKE 1 TABLET (10 MG TOTAL) BY MOUTH DAILY.   tamsulosin (FLOMAX) 0.4 MG CAPS capsule Take 1 capsule (0.4 mg total) by mouth daily.   tamsulosin (FLOMAX) 0.4 MG CAPS capsule TAKE 1 CAPSULE BY MOUTH EVERY NIGHT AT BEDTIME   trimethoprim-polymyxin b (POLYTRIM) ophthalmic solution Instill 1 drop into right eye four times a day      No Known Allergies  Social History   Socioeconomic History   Marital status: Married    Spouse name: Not on file   Number of children: 2   Years of education: HS   Highest education level: Not on file  Occupational History   Occupation: Retired  Tobacco Use   Smoking status: Never   Smokeless tobacco: Never  Vaping Use   Vaping Use: Never used  Substance and Sexual Activity   Alcohol use: No   Drug use: No   Sexual activity: Yes  Other Topics Concern   Not on file  Social History Narrative   Medical Decision Maker:  Richard Reed (wife) Cell. [place]: [phone].   Daughter: Richard Reed: Cell [phone]   Full Code   Patient is Left handed.   Patient drinks 1 cup of caffeine daily.   Patient is left handed.   Social Determinants of Health   Financial Resource Strain: Not on file  Food Insecurity: Not on file  Transportation Needs: Not on file  Physical Activity: Not on file  Stress: Not on file  Social Connections: Not on file  Intimate Partner Violence: Not on file     Review of Systems: General: negative for chills, fever, night sweats or weight changes.  Cardiovascular: negative for chest pain, dyspnea on exertion, edema, orthopnea, palpitations, paroxysmal nocturnal dyspnea or shortness of breath Dermatological: negative for rash Respiratory: negative for cough or wheezing Urologic: negative for hematuria Abdominal: negative for nausea, vomiting, diarrhea, bright red blood per rectum, melena, or hematemesis Neurologic: negative for visual changes, syncope, or dizziness All other systems reviewed and are otherwise negative except as noted above.    Blood pressure 130/78, pulse (!) 135, height '5\' 7"'$  (1.702 m), weight 139 lb (63 kg).  General appearance: alert and no distress Neck: no adenopathy, no carotid bruit, no JVD, supple, symmetrical, trachea midline, and thyroid not enlarged, symmetric, no tenderness/mass/nodules Lungs:  clear to auscultation bilaterally Heart: irregularly irregular rhythm and soft outflow tract murmur consistent with aortic stenosis Extremities: extremities normal, atraumatic, no cyanosis or edema Pulses: 2+ and symmetric Skin: Skin color, texture, turgor normal. No rashes or lesions Neurologic: Grossly normal  EKG atrial fibrillation with a ventricular sponsor of 135 and occasional aberrantly conducted beats.  I personally reviewed this EKG.  ASSESSMENT AND PLAN:   PAF (paroxysmal atrial fibrillation) (HCC) History of PAF currently in A. fib with a rapid ventricular sponsor 135 on Xarelto oral anticoagulation and beta-blockade.  He is unaware that he is in A. fib.  His last EKG a year ago showed sinus rhythm with PACs.  I am going to increase his Toprol from 12 and half milligrams in the morning to 25 mg in the morning and continue the 12 mg in the afternoon.  He will see a Pharm.D. in 1 month to assess his heart rate response and titrate his beta-blocker for rate control.  Aortic valve insufficiency,  moderate History of aortic valve disease with recent 2D echo performed 09/30/2019 revealing moderate AI and moderate AAS along with mild to moderate MR.  Given his age I do not feel compelled to continue to follow this on an ongoing basis.  Coronary artery disease due to lipid rich plaque History of CAD status post cardiac catheterization by myself June 2012 revealing normal coronary arteries with 40 to 60% ostial right coronary stenosis without damping.  He denies chest pain.  Left ventricular dysfunction History of left ventricular dysfunction with an EF of 35 to 40% back in March 2021 which came up to 40 to 45% in June 2021.     Lorretta Harp MD FACP,FACC,FAHA, [facility] 12/08/2020 2:19 PM

## 2020-12-08 NOTE — Assessment & Plan Note (Signed)
History of left ventricular dysfunction with an EF of 35 to 40% back in March 2021 which came up to 40 to 45% in June 2021.

## 2020-12-08 NOTE — Assessment & Plan Note (Signed)
History of PAF currently in A. fib with a rapid ventricular sponsor 135 on Xarelto oral anticoagulation and beta-blockade.  He is unaware that he is in A. fib.  His last EKG a year ago showed sinus rhythm with PACs.  I am going to increase his Toprol from 12 and half milligrams in the morning to 25 mg in the morning and continue the 12 mg in the afternoon.  He will see a Pharm.D. in 1 month to assess his heart rate response and titrate his beta-blocker for rate control.

## 2020-12-08 NOTE — Assessment & Plan Note (Signed)
History of aortic valve disease with recent 2D echo performed 09/30/2019 revealing moderate AI and moderate AAS along with mild to moderate MR.  Given his age I do not feel compelled to continue to follow this on an ongoing basis.

## 2020-12-08 NOTE — Assessment & Plan Note (Signed)
History of CAD status post cardiac catheterization by myself June 2012 revealing normal coronary arteries with 40 to 60% ostial right coronary stenosis without damping.  He denies chest pain.

## 2020-12-08 NOTE — Patient Instructions (Addendum)
Medication Instructions:  Take Metoprolol 1 tablet (25 mg) in the morning, and 0.5 tablet (12.5 mg) in the evening.  *If you need a refill on your cardiac medications before your next appointment, please call your pharmacy*   Follow-Up: At [facility], you and your health needs are our priority.  As part of our continuing mission to provide you with exceptional heart care, we have created designated Provider Care Teams.  These Care Teams include your primary Cardiologist (physician) and Advanced Practice Providers (APPs -  Physician Assistants and Nurse Practitioners) who all work together to provide you with the care you need, when you need it.  We recommend signing up for the patient portal called "MyChart".  Sign up information is provided on this After Visit Summary.  MyChart is used to connect with patients for Virtual Visits (Telemedicine).  Patients are able to view lab/test results, encounter notes, upcoming appointments, etc.  Non-urgent messages can be sent to your provider as well.   To learn more about what you can do with MyChart, go to [email or web address].    Your next appointment:   6 month(s)  The format for your next appointment:   In Person  Provider:   You will see one of the following Advanced Practice Providers on your designated Care Team:   Sande Rives, PA-C Coletta Memos, FNP  Then, Quay Burow, MD will plan to see you again in 12 month(s).   Other Instructions See PHARMD in 1 month for BP check and medication adjustment.

## 2020-12-09 ENCOUNTER — Other Ambulatory Visit (HOSPITAL_COMMUNITY): Payer: Self-pay

## 2020-12-10 ENCOUNTER — Other Ambulatory Visit (HOSPITAL_COMMUNITY): Payer: Self-pay

## 2020-12-10 DIAGNOSIS — H0234 Blepharochalasis left upper eyelid: Secondary | ICD-10-CM | POA: Diagnosis not present

## 2020-12-10 DIAGNOSIS — L980 Pyogenic granuloma: Secondary | ICD-10-CM | POA: Diagnosis not present

## 2020-12-10 DIAGNOSIS — H0231 Blepharochalasis right upper eyelid: Secondary | ICD-10-CM | POA: Diagnosis not present

## 2020-12-10 DIAGNOSIS — H57813 Brow ptosis, bilateral: Secondary | ICD-10-CM | POA: Diagnosis not present

## 2020-12-10 MED ORDER — TOBRAMYCIN-DEXAMETHASONE 0.3-0.1 % OP SUSP
1.0000 [drp] | Freq: Two times a day (BID) | OPHTHALMIC | 0 refills | Status: DC
  Filled 2020-12-10: qty 2.5, 7d supply, fill #0

## 2020-12-10 MED ORDER — NEOMYCIN-POLYMYXIN-DEXAMETH 0.1 % OP OINT
TOPICAL_OINTMENT | OPHTHALMIC | 0 refills | Status: DC
  Filled 2020-12-10: qty 3.5, 3d supply, fill #0

## 2020-12-11 DIAGNOSIS — U071 COVID-19: Secondary | ICD-10-CM | POA: Diagnosis not present

## 2020-12-11 DIAGNOSIS — J029 Acute pharyngitis, unspecified: Secondary | ICD-10-CM | POA: Diagnosis not present

## 2020-12-11 DIAGNOSIS — R059 Cough, unspecified: Secondary | ICD-10-CM | POA: Diagnosis not present

## 2020-12-11 DIAGNOSIS — J069 Acute upper respiratory infection, unspecified: Secondary | ICD-10-CM | POA: Diagnosis not present

## 2020-12-16 DIAGNOSIS — I2510 Atherosclerotic heart disease of native coronary artery without angina pectoris: Secondary | ICD-10-CM | POA: Diagnosis not present

## 2020-12-16 DIAGNOSIS — I10 Essential (primary) hypertension: Secondary | ICD-10-CM | POA: Diagnosis not present

## 2020-12-16 DIAGNOSIS — I5032 Chronic diastolic (congestive) heart failure: Secondary | ICD-10-CM | POA: Diagnosis not present

## 2020-12-16 DIAGNOSIS — N185 Chronic kidney disease, stage 5: Secondary | ICD-10-CM | POA: Diagnosis not present

## 2020-12-18 ENCOUNTER — Other Ambulatory Visit (HOSPITAL_COMMUNITY): Payer: Self-pay

## 2020-12-28 DIAGNOSIS — B029 Zoster without complications: Secondary | ICD-10-CM | POA: Diagnosis not present

## 2021-01-04 ENCOUNTER — Other Ambulatory Visit: Payer: Self-pay | Admitting: Cardiovascular Disease

## 2021-01-04 ENCOUNTER — Other Ambulatory Visit (HOSPITAL_COMMUNITY): Payer: Self-pay

## 2021-01-04 MED ORDER — ROSUVASTATIN CALCIUM 10 MG PO TABS
ORAL_TABLET | Freq: Every day | ORAL | 1 refills | Status: DC
Start: 2021-01-04 — End: 2021-06-18
  Filled 2021-01-04: qty 90, 90d supply, fill #0
  Filled 2021-03-29: qty 90, 90d supply, fill #1

## 2021-01-04 MED FILL — Tamsulosin HCl Cap 0.4 MG: ORAL | 60 days supply | Qty: 60 | Fill #2 | Status: AC

## 2021-01-04 MED FILL — Furosemide Tab 20 MG: ORAL | 90 days supply | Qty: 90 | Fill #1 | Status: AC

## 2021-01-06 ENCOUNTER — Other Ambulatory Visit (HOSPITAL_COMMUNITY): Payer: Self-pay

## 2021-01-06 DIAGNOSIS — R3914 Feeling of incomplete bladder emptying: Secondary | ICD-10-CM | POA: Diagnosis not present

## 2021-01-06 DIAGNOSIS — N401 Enlarged prostate with lower urinary tract symptoms: Secondary | ICD-10-CM | POA: Diagnosis not present

## 2021-01-06 DIAGNOSIS — N200 Calculus of kidney: Secondary | ICD-10-CM | POA: Diagnosis not present

## 2021-01-06 DIAGNOSIS — Z8546 Personal history of malignant neoplasm of prostate: Secondary | ICD-10-CM | POA: Diagnosis not present

## 2021-01-06 MED ORDER — TAMSULOSIN HCL 0.4 MG PO CAPS
ORAL_CAPSULE | ORAL | 3 refills | Status: DC
  Filled 2021-01-06: qty 90, 90d supply, fill #0

## 2021-01-07 ENCOUNTER — Ambulatory Visit: Payer: Medicare Other

## 2021-01-12 ENCOUNTER — Encounter: Payer: Self-pay | Admitting: Pharmacist Clinician (PhC)/ Clinical Pharmacy Specialist

## 2021-01-12 ENCOUNTER — Other Ambulatory Visit: Payer: Self-pay

## 2021-01-12 ENCOUNTER — Ambulatory Visit: Payer: Medicare Other | Admitting: Pharmacist Clinician (PhC)/ Clinical Pharmacy Specialist

## 2021-01-12 DIAGNOSIS — I480 Paroxysmal atrial fibrillation: Secondary | ICD-10-CM | POA: Diagnosis not present

## 2021-01-12 NOTE — Patient Instructions (Signed)
  Check your blood pressure at home daily (if able) and keep record of the readings.  Take your meds as follows:  Switch metoprolol doses and take 1/2 tablet (12.5 mg) in the mornings and 1 tablet (25 mg) in the evenings  Keep track of any times you feel your heart pounding too hard/fast and let your daughter know.  If this happens more than once a month (on average), please reach out to our office.    Bring all of your meds, your BP cuff and your record of home blood pressures to your next appointment.  Exercise as you're able, try to walk approximately 30 minutes per day.  Keep salt intake to a minimum, especially watch canned and prepared boxed foods.  Eat more fresh fruits and vegetables and fewer canned items.  Avoid eating in fast food restaurants.    HOW TO TAKE YOUR BLOOD PRESSURE: Rest 5 minutes before taking your blood pressure.  Don't smoke or drink caffeinated beverages for at least 30 minutes before. Take your blood pressure before (not after) you eat. Sit comfortably with your back supported and both feet on the floor (don't cross your legs). Elevate your arm to heart level on a table or a desk. Use the proper sized cuff. It should fit smoothly and snugly around your bare upper arm. There should be enough room to slip a fingertip under the cuff. The bottom edge of the cuff should be 1 inch above the crease of the elbow. Ideally, take 3 measurements at one sitting and record the average.

## 2021-01-12 NOTE — Progress Notes (Signed)
01/14/2021 [name] 08-Apr-1926 [id number]   HPI:  Richard Reed is a 87 y.o. male patient of Dr [name], with a PMH below who presents today for PAF medication titration.  Patient has history of AF with RVR to 135, currently on Xarelto 15 and metoprolol succ 25 mg.  At his last visit with Dr. [name], HR was 135 and metoprolol was increased from 12.5 mg bid to 25 mg in the mornings and 12.5 mg at night.  Since then he has not noticed any problems with rapid heart rate.    He is here today with his daughter.  He doesn't have a blood pressure cuff at home, his daughter checks it regularly when she comes to his house with her meter.  States he feels well except that he has been more tired since Dr. [name] increased the metoprolol.  No noticeable atrial fibrillation, unable to check heart rate unless his daughter is at the house.    Past Medical History: CHF EF 45-50%, moderate aortic insufficiency  hypertension controlled  hyperlipidemia LDL 47  on rosuvastatin 10 mg daily  CAD 40-60% ostial RCA stenosis     Blood Pressure Goal:  130/80  Current Medications: metoprolol succ 25/12.5 mg, furosemide 20 mg, rivaroxaban 15 mg qd   Intolerances: nkda  Labs: 4/22:  Na 143, K 4.7, Glu 96, BUN 31, SCr 1.56     Wt Readings from Last 3 Encounters:  01/12/21 140 lb 12.8 oz (63.9 kg)  12/08/20 139 lb (63 kg)  06/09/20 134 lb 9.6 oz (61.1 kg)   BP Readings from Last 3 Encounters:  01/12/21 112/82  12/08/20 130/78  06/09/20 132/64   Pulse Readings from Last 3 Encounters:  01/12/21 70  12/08/20 (!) 135  06/09/20 80    Current Outpatient Medications  Medication Sig Dispense Refill   B Complex-C (SUPER B COMPLEX PO) Take 1 tablet by mouth daily.     Cyanocobalamin 5000 MCG TBDP Take 5,000 mcg by mouth daily. Vitamin B12     feeding supplement, ENSURE ENLIVE, (ENSURE ENLIVE) LIQD Take 237 mLs by mouth 2 (two) times daily between meals. 237 mL 12   furosemide (LASIX) 20 MG tablet TAKE  1 TABLET (20 MG TOTAL) BY MOUTH DAILY. 90 tablet 4   metoprolol succinate (TOPROL-XL) 25 MG 24 hr tablet Take 1 tablet (25 mg) by mouth in the morning, and 0.5 tablet (12.5 mg) by mouth in the evening. 90 tablet 3   Multiple Vitamins-Minerals (MULTIVITAMIN WITH MINERALS) tablet Take 1 tablet by mouth daily.     Propylene Glycol (SYSTANE BALANCE OP) Place 1 drop into both eyes 2 (two) times daily.     Rivaroxaban (XARELTO) 15 MG TABS tablet TAKE 1 TABLET (15 MG TOTAL) BY MOUTH DAILY WITH SUPPER. 60 tablet 5   rosuvastatin (CRESTOR) 10 MG tablet TAKE 1 TABLET (10 MG TOTAL) BY MOUTH DAILY. 90 tablet 1   tamsulosin (FLOMAX) 0.4 MG CAPS capsule Take 1 capsule (0.4 mg total) by mouth daily. 30 capsule 11   aspirin EC 81 MG tablet Take 81 mg by mouth daily. (Patient not taking: Reported on 01/12/2021)     calcium carbonate (OSCAL) 1500 (600 Ca) MG TABS tablet Take 600 mg of elemental calcium by mouth daily with breakfast.     doxycycline (VIBRA-TABS) 100 MG tablet Take 1 tablet by mouth twice a day (Patient not taking: Reported on 01/12/2021) 20 tablet 0   neomycin-polymyxin-dexameth (MAXITROL) 0.1 % OINT apply a small  amount onto sutures or operative site twice a day for 3 days then stop (Patient not taking: Reported on 01/12/2021) 3.5 g 0   Omega-3 Fatty Acids (FISH OIL PO) Take 1,400 mg by mouth at bedtime. (Patient not taking: Reported on 01/12/2021)     pantoprazole (PROTONIX) 40 MG tablet Take 40 mg by mouth daily. (Patient not taking: Reported on 01/12/2021)     tobramycin-dexamethasone (TOBRADEX) ophthalmic solution Place 1 drop into the right eye 2 (two) times daily for 1 week (Patient not taking: Reported on 01/12/2021) 5 mL 0   trimethoprim-polymyxin b (POLYTRIM) ophthalmic solution Instill 1 drop into right eye four times a day (Patient not taking: Reported on 01/12/2021) 10 mL 0   No current facility-administered medications for this visit.    No Known Allergies  Past Medical History:  Diagnosis  Date   Angina    Aortic valve insufficiency, moderate    a. 02/2016 Echo: mild AS, mild to mod AI.   Arthritis    Arthritis pain    CAD (coronary artery disease)    a. 09/2010 Cath: RCA 40-60 ost, otw nl cors;  b. 03/2016 Lexiscan MV: EF 46%, no ischemia, low risk.   Cancer [facility])    Carotid disease, bilateral ([place])    a. 02/2016 Carotid U/S: 1-39% bilat ICA plaquing.   Cerebral aneurysm    a. 02/2016 MRA: fusiform bilobed aneurysm @ R MCA bifurcation.   Cerebrovascular disease    a. 02/2016 MRA: long segment narrowing of basilar artery, moderate bilat posterior inferior cerebellar artery dzs.   Chronic combined systolic and diastolic CHF (congestive heart failure) ([place])    a. 02/2016 Admitted to hospital in [place] w/ CHF;  b. 02/2016 Echo ([facility]): EF 45-50%, diff HK, gr1DD, mild AS, mild to mod AI, mildly dil Ao root, PASP 70mmHg.   CKD (chronic kidney disease), stage III ([place])    Dysrhythmia    Essential hypertension    Hard of hearing    Hemiparesis and alteration of sensations as late effects of stroke ([place]) 04/29/2015   Hiatal hernia    History of kidney stones    HOH (hard of hearing)    Hyperlipidemia    Kidney stones    Moderate aortic insufficiency    NICM (nonischemic cardiomyopathy) ([place])    a. 02/2016 Echo: EF 45-50%, diff HK;  b. 03/2016 Lexiscan MV: No ischemia, EF 46%.   NSVT (nonsustained ventricular tachycardia) ([place])    a. 03/2016 noted on event monitor.   PAF (paroxysmal atrial fibrillation) ([place])    a. 2012 - noted in setting of E coli bacteremia and septic shock.   Pneumonia 03/23/2011   "first time"   Prostate cancer ([place])    Sepsis due to Escherichia coli ([place]) 03/24/11   Syncope    a. 02/2016 presumed to be 2/2 hypotension/dehydration and basilar artery dzs noted on MRA.   TIA (transient ischemic attack)    Vertebrobasilar insufficiency 04/28/2016   Visual disturbance 03/19/2014    Blood pressure 112/82, pulse 70, resp. rate 15, height 5\' 7"  (1.702 m),  weight 140 lb 12.8 oz (63.9 kg), SpO2 98 %.  PAF (paroxysmal atrial fibrillation) ([place]) Patient with recent episode of AF with RVR while in office.  Has not noticed any specific instances of AF since that time.  Blood pressure well controlled.  Will have him switch the metoprolol so that he takes 25 mg at bedtime and 12.5 mg in the mornings.  Hopefully this will help with daytime drowsiness.  He should let his daughter know if he feels his heart racing, and she can reach out to the office.     Tommy Medal PharmD CPP [facility] [address] [place],  [address] [phone]

## 2021-01-13 ENCOUNTER — Telehealth: Payer: Self-pay

## 2021-01-13 DIAGNOSIS — Z23 Encounter for immunization: Secondary | ICD-10-CM | POA: Diagnosis not present

## 2021-01-13 NOTE — Telephone Encounter (Signed)
Richard Reed and [employer] pt assistance application completed and faxed on behalf of the pt.

## 2021-01-14 ENCOUNTER — Encounter: Payer: Self-pay | Admitting: Pharmacist Clinician (PhC)/ Clinical Pharmacy Specialist

## 2021-01-14 NOTE — Assessment & Plan Note (Signed)
Patient with recent episode of AF with RVR while in office.  Has not noticed any specific instances of AF since that time.  Blood pressure well controlled.  Will have him switch the metoprolol so that he takes 25 mg at bedtime and 12.5 mg in the mornings.  Hopefully this will help with daytime drowsiness.  He should let his daughter know if he feels his heart racing, and she can reach out to the office.

## 2021-01-15 DIAGNOSIS — H57813 Brow ptosis, bilateral: Secondary | ICD-10-CM | POA: Diagnosis not present

## 2021-01-15 DIAGNOSIS — H0231 Blepharochalasis right upper eyelid: Secondary | ICD-10-CM | POA: Diagnosis not present

## 2021-01-15 DIAGNOSIS — H0234 Blepharochalasis left upper eyelid: Secondary | ICD-10-CM | POA: Diagnosis not present

## 2021-01-15 DIAGNOSIS — H0232 Blepharochalasis right lower eyelid: Secondary | ICD-10-CM | POA: Diagnosis not present

## 2021-02-01 ENCOUNTER — Other Ambulatory Visit (HOSPITAL_COMMUNITY): Payer: Self-pay

## 2021-02-04 DIAGNOSIS — E785 Hyperlipidemia, unspecified: Secondary | ICD-10-CM | POA: Diagnosis not present

## 2021-02-04 DIAGNOSIS — I10 Essential (primary) hypertension: Secondary | ICD-10-CM | POA: Diagnosis not present

## 2021-02-04 DIAGNOSIS — N1832 Chronic kidney disease, stage 3b: Secondary | ICD-10-CM | POA: Diagnosis not present

## 2021-02-04 DIAGNOSIS — M159 Polyosteoarthritis, unspecified: Secondary | ICD-10-CM | POA: Diagnosis not present

## 2021-02-17 ENCOUNTER — Other Ambulatory Visit (HOSPITAL_COMMUNITY): Payer: Self-pay

## 2021-02-17 MED ORDER — NEOMYCIN-POLYMYXIN-DEXAMETH 3.5-10000-0.1 OP OINT
TOPICAL_OINTMENT | OPHTHALMIC | 0 refills | Status: DC
  Filled 2021-02-17: qty 3.5, 3d supply, fill #0

## 2021-02-22 ENCOUNTER — Other Ambulatory Visit (HOSPITAL_COMMUNITY): Payer: Self-pay

## 2021-02-22 MED FILL — Rivaroxaban Tab 15 MG: ORAL | 60 days supply | Qty: 60 | Fill #1 | Status: AC

## 2021-02-23 ENCOUNTER — Other Ambulatory Visit (HOSPITAL_COMMUNITY): Payer: Self-pay

## 2021-02-24 DIAGNOSIS — H5501 Congenital nystagmus: Secondary | ICD-10-CM | POA: Diagnosis not present

## 2021-02-24 DIAGNOSIS — Z961 Presence of intraocular lens: Secondary | ICD-10-CM | POA: Diagnosis not present

## 2021-02-24 DIAGNOSIS — H04123 Dry eye syndrome of bilateral lacrimal glands: Secondary | ICD-10-CM | POA: Diagnosis not present

## 2021-02-24 DIAGNOSIS — D3131 Benign neoplasm of right choroid: Secondary | ICD-10-CM | POA: Diagnosis not present

## 2021-03-25 ENCOUNTER — Other Ambulatory Visit (HOSPITAL_COMMUNITY): Payer: Self-pay

## 2021-03-25 DIAGNOSIS — H00032 Abscess of right lower eyelid: Secondary | ICD-10-CM | POA: Diagnosis not present

## 2021-03-25 DIAGNOSIS — H57813 Brow ptosis, bilateral: Secondary | ICD-10-CM | POA: Diagnosis not present

## 2021-03-25 DIAGNOSIS — H0231 Blepharochalasis right upper eyelid: Secondary | ICD-10-CM | POA: Diagnosis not present

## 2021-03-25 DIAGNOSIS — H0234 Blepharochalasis left upper eyelid: Secondary | ICD-10-CM | POA: Diagnosis not present

## 2021-03-25 MED ORDER — NEOMYCIN-POLYMYXIN-DEXAMETH 3.5-10000-0.1 OP OINT
TOPICAL_OINTMENT | OPHTHALMIC | 0 refills | Status: DC
  Filled 2021-03-25: qty 3.5, 3d supply, fill #0

## 2021-03-29 ENCOUNTER — Other Ambulatory Visit (HOSPITAL_COMMUNITY): Payer: Self-pay

## 2021-03-29 ENCOUNTER — Other Ambulatory Visit: Payer: Self-pay | Admitting: Cardiovascular Disease

## 2021-03-29 MED ORDER — FUROSEMIDE 20 MG PO TABS
ORAL_TABLET | Freq: Every day | ORAL | 4 refills | Status: DC
  Filled 2021-03-29: qty 90, 90d supply, fill #0
  Filled 2021-06-18: qty 90, 90d supply, fill #1
  Filled 2021-09-24: qty 90, 90d supply, fill #2
  Filled 2021-12-27: qty 90, 90d supply, fill #3
  Filled 2022-03-28: qty 90, 90d supply, fill #4

## 2021-03-30 ENCOUNTER — Other Ambulatory Visit (HOSPITAL_COMMUNITY): Payer: Self-pay

## 2021-04-20 ENCOUNTER — Other Ambulatory Visit (HOSPITAL_COMMUNITY): Payer: Self-pay

## 2021-04-20 ENCOUNTER — Telehealth: Payer: Self-pay | Admitting: Cardiovascular Disease

## 2021-04-20 MED FILL — Rivaroxaban Tab 15 MG: ORAL | 60 days supply | Qty: 60 | Fill #2 | Status: CN

## 2021-04-20 NOTE — Telephone Encounter (Signed)
Patient calling the office for samples of medication:   1.  What medication and dosage are you requesting samples for? Rivaroxaban (XARELTO) 15 MG TABS tablet  2.  Are you currently out of this medication? yes

## 2021-04-20 NOTE — Telephone Encounter (Signed)
Spoke to patient's daughter [name] she stated father needs Xarelto 15 mg samples.Xarelto 15 mg samples along with a patient assistance form left at front desk.Daughter will complete and bring back to office with proof of his income.

## 2021-04-21 ENCOUNTER — Other Ambulatory Visit (HOSPITAL_COMMUNITY): Payer: Self-pay

## 2021-04-22 DIAGNOSIS — H0231 Blepharochalasis right upper eyelid: Secondary | ICD-10-CM | POA: Diagnosis not present

## 2021-04-22 DIAGNOSIS — H0234 Blepharochalasis left upper eyelid: Secondary | ICD-10-CM | POA: Diagnosis not present

## 2021-04-22 DIAGNOSIS — H57813 Brow ptosis, bilateral: Secondary | ICD-10-CM | POA: Diagnosis not present

## 2021-04-22 DIAGNOSIS — H00032 Abscess of right lower eyelid: Secondary | ICD-10-CM | POA: Diagnosis not present

## 2021-05-07 ENCOUNTER — Telehealth: Payer: Self-pay | Admitting: Cardiovascular Disease

## 2021-05-07 NOTE — Telephone Encounter (Signed)
Spoke with Daughter regarding pt assistance. Per fax I received pt was denied. Will call company to check into this, per fax pt was denied because pt has insurance coverage for this prescription, however pt does not have prescription drug coverage. Will call pt assistance to follow up with this. Daughter verbalizes understanding.

## 2021-05-07 NOTE — Telephone Encounter (Signed)
Pts daughter calling in to check the status of assistance forms... please advise

## 2021-05-07 NOTE — Telephone Encounter (Signed)
Returned call to daughter she states that she is checking on the status of Xarelto pt assistance forms she states that she dropped them off to [place] about 3 weeks ago. She is just checking on the status. Please advise.

## 2021-05-10 ENCOUNTER — Telehealth: Payer: Self-pay | Admitting: Cardiovascular Disease

## 2021-05-10 NOTE — Telephone Encounter (Signed)
Patient calling the office for samples of medication:   1.  What medication and dosage are you requesting samples for? Rivaroxaban (XARELTO) 15 MG TABS tablet  2.  Are you currently out of this medication? No, can leave a message with the information

## 2021-05-10 NOTE — Telephone Encounter (Signed)
Medication samples have been provided to the patient.  Drug name: Xarelto 15mg  Qty: 3 LOT: [id number] Exp.Date: 10/2022  Samples left at front desk for patient pick-up. Patient notified.  Left message for pt's daughter regarding samples.

## 2021-05-11 ENCOUNTER — Telehealth: Payer: Self-pay

## 2021-05-11 ENCOUNTER — Other Ambulatory Visit (HOSPITAL_COMMUNITY): Payer: Self-pay

## 2021-05-11 NOTE — Telephone Encounter (Signed)
Left message for pt's daughter to call back for information on xarelto and pt assistance moving forward.

## 2021-05-11 NOTE — Telephone Encounter (Signed)
Late entry: 04/30/21 pt assistance for xarelto completed and faxed to [employer].   05/07/21: received denial letter from [employer]. Relayed this information to pt's daughter.

## 2021-05-11 NOTE — Telephone Encounter (Signed)
Spoke with pt's daughter regarding options moving forward for xarelto prescription. Option for staying on xarelto and using mail order pharmacy for approximately $85 per month or option to switch pt to eliquis and apply for pt assistance with manufacture. Daughter states that she will discuss both options with her parents and let me know what they decide. All questions answered. Daughter verbalizes understanding.

## 2021-05-13 NOTE — Telephone Encounter (Signed)
Patient's daughter called she said that is want to wants to do.  She just needs to know what she needs to do know.

## 2021-05-17 NOTE — Telephone Encounter (Signed)
Spoke with pt's daughter (ok per DPR) regarding xarelto. Daughter states after discussing with her parents they would like to do the mail order pharmacy that charges $85 per month. Daughter states that they have about 2 months of medication on hand so she would like to wait for about a month before we send this prescription in. Will send prescription in in 1 month. Daughter verbalizes understanding.

## 2021-06-01 ENCOUNTER — Other Ambulatory Visit (HOSPITAL_COMMUNITY): Payer: Self-pay

## 2021-06-02 ENCOUNTER — Other Ambulatory Visit (HOSPITAL_COMMUNITY): Payer: Self-pay

## 2021-06-02 MED ORDER — TAMSULOSIN HCL 0.4 MG PO CAPS
ORAL_CAPSULE | ORAL | 3 refills | Status: DC
  Filled 2021-06-02: qty 90, 90d supply, fill #0
  Filled 2021-08-31: qty 90, 90d supply, fill #1
  Filled 2021-11-24: qty 90, 90d supply, fill #2

## 2021-06-08 ENCOUNTER — Other Ambulatory Visit (HOSPITAL_COMMUNITY): Payer: Self-pay

## 2021-06-08 DIAGNOSIS — H00032 Abscess of right lower eyelid: Secondary | ICD-10-CM | POA: Diagnosis not present

## 2021-06-08 DIAGNOSIS — H0234 Blepharochalasis left upper eyelid: Secondary | ICD-10-CM | POA: Diagnosis not present

## 2021-06-08 DIAGNOSIS — H57813 Brow ptosis, bilateral: Secondary | ICD-10-CM | POA: Diagnosis not present

## 2021-06-08 DIAGNOSIS — H0231 Blepharochalasis right upper eyelid: Secondary | ICD-10-CM | POA: Diagnosis not present

## 2021-06-08 MED ORDER — AMOXICILLIN-POT CLAVULANATE 875-125 MG PO TABS
ORAL_TABLET | ORAL | 0 refills | Status: DC
  Filled 2021-06-08: qty 14, 7d supply, fill #0

## 2021-06-08 MED ORDER — ERYTHROMYCIN 5 MG/GM OP OINT
TOPICAL_OINTMENT | OPHTHALMIC | 0 refills | Status: DC
  Filled 2021-06-08: qty 3.5, 7d supply, fill #0

## 2021-06-18 ENCOUNTER — Other Ambulatory Visit (HOSPITAL_COMMUNITY): Payer: Self-pay

## 2021-06-18 ENCOUNTER — Other Ambulatory Visit: Payer: Self-pay | Admitting: Cardiovascular Disease

## 2021-06-18 DIAGNOSIS — H0231 Blepharochalasis right upper eyelid: Secondary | ICD-10-CM | POA: Diagnosis not present

## 2021-06-18 DIAGNOSIS — H00032 Abscess of right lower eyelid: Secondary | ICD-10-CM | POA: Diagnosis not present

## 2021-06-18 DIAGNOSIS — H57813 Brow ptosis, bilateral: Secondary | ICD-10-CM | POA: Diagnosis not present

## 2021-06-18 DIAGNOSIS — H0234 Blepharochalasis left upper eyelid: Secondary | ICD-10-CM | POA: Diagnosis not present

## 2021-06-18 MED ORDER — ROSUVASTATIN CALCIUM 10 MG PO TABS
ORAL_TABLET | Freq: Every day | ORAL | 1 refills | Status: DC
  Filled 2021-06-18: qty 90, 90d supply, fill #0
  Filled 2021-09-24: qty 90, 90d supply, fill #1

## 2021-06-25 ENCOUNTER — Other Ambulatory Visit (HOSPITAL_COMMUNITY): Payer: Self-pay

## 2021-06-25 DIAGNOSIS — N3000 Acute cystitis without hematuria: Secondary | ICD-10-CM | POA: Diagnosis not present

## 2021-06-25 DIAGNOSIS — R8271 Bacteriuria: Secondary | ICD-10-CM | POA: Diagnosis not present

## 2021-06-25 MED ORDER — CEPHALEXIN 500 MG PO CAPS
ORAL_CAPSULE | ORAL | 0 refills | Status: DC
  Filled 2021-06-25: qty 14, 7d supply, fill #0

## 2021-06-29 ENCOUNTER — Other Ambulatory Visit (HOSPITAL_COMMUNITY): Payer: Self-pay

## 2021-06-29 MED ORDER — CIPROFLOXACIN HCL 500 MG PO TABS
ORAL_TABLET | ORAL | 0 refills | Status: DC
  Filled 2021-06-29: qty 14, 7d supply, fill #0

## 2021-07-02 DIAGNOSIS — N3000 Acute cystitis without hematuria: Secondary | ICD-10-CM | POA: Diagnosis not present

## 2021-07-02 DIAGNOSIS — R311 Benign essential microscopic hematuria: Secondary | ICD-10-CM | POA: Diagnosis not present

## 2021-07-15 ENCOUNTER — Ambulatory Visit: Payer: Medicare Other | Admitting: Nurse Practitioner

## 2021-07-15 ENCOUNTER — Encounter: Payer: Self-pay | Admitting: Nurse Practitioner

## 2021-07-15 VITALS — BP 130/62 | HR 80 | Ht 67.0 in | Wt 144.2 lb

## 2021-07-15 DIAGNOSIS — I480 Paroxysmal atrial fibrillation: Secondary | ICD-10-CM

## 2021-07-15 DIAGNOSIS — I340 Nonrheumatic mitral (valve) insufficiency: Secondary | ICD-10-CM | POA: Diagnosis not present

## 2021-07-15 DIAGNOSIS — I679 Cerebrovascular disease, unspecified: Secondary | ICD-10-CM

## 2021-07-15 DIAGNOSIS — I10 Essential (primary) hypertension: Secondary | ICD-10-CM

## 2021-07-15 DIAGNOSIS — I5042 Chronic combined systolic (congestive) and diastolic (congestive) heart failure: Secondary | ICD-10-CM

## 2021-07-15 DIAGNOSIS — I2510 Atherosclerotic heart disease of native coronary artery without angina pectoris: Secondary | ICD-10-CM

## 2021-07-15 DIAGNOSIS — E785 Hyperlipidemia, unspecified: Secondary | ICD-10-CM

## 2021-07-15 DIAGNOSIS — I351 Nonrheumatic aortic (valve) insufficiency: Secondary | ICD-10-CM

## 2021-07-15 MED ORDER — RIVAROXABAN 15 MG PO TABS
15.0000 mg | ORAL_TABLET | Freq: Every day | ORAL | 0 refills | Status: DC
Start: 2021-07-15 — End: 2021-09-16

## 2021-07-15 NOTE — Patient Instructions (Addendum)
Medication Instructions:  ?Your physician recommends that you continue on your current medications as directed. Please refer to the Current Medication list given to you today. ? ?*If you need a refill on your cardiac medications before your next appointment, please call your pharmacy* ? ? ?Lab Work: ?NONE ordered at this time of appointment  ? ?If you have labs (blood work) drawn today and your tests are completely normal, you will receive your results only by: ?MyChart Message (if you have MyChart) OR ?A paper copy in the mail ?If you have any lab test that is abnormal or we need to change your treatment, we will call you to review the results. ? ? ?Testing/Procedures: ?NONE ordered at this time of appointment  ? ? ? ?Follow-Up: ?At [facility], you and your health needs are our priority.  As part of our continuing mission to provide you with exceptional heart care, we have created designated Provider Care Teams.  These Care Teams include your primary Cardiologist (physician) and Advanced Practice Providers (APPs -  Physician Assistants and Nurse Practitioners) who all work together to provide you with the care you need, when you need it. ? ?We recommend signing up for the patient portal called "MyChart".  Sign up information is provided on this After Visit Summary.  MyChart is used to connect with patients for Virtual Visits (Telemedicine).  Patients are able to view lab/test results, encounter notes, upcoming appointments, etc.  Non-urgent messages can be sent to your provider as well.   ?To learn more about what you can do with MyChart, go to [email or web address].   ? ?Your next appointment:   ?6 month(s) ? ?The format for your next appointment:   ?In Person ? ?Provider:   ?Quay Burow, MD   ? ? ?Other Instructions ?Samples of this drug were given to the patient, quantity 4 Bottles, Lot Number [id number]  ?Samples of Xarelto 15 mg given to pt.  ? ?

## 2021-07-15 NOTE — Progress Notes (Signed)
? ? ?Office Visit  ?  ?Patient Name: Richard Reed ?Date of Encounter: 07/15/2021 ? ?Primary Care Provider:  [name], FNP ?Primary Cardiologist:  Quay Burow, MD ? ?Chief Complaint  ?  ?88 year old male with a history of CAD, paroxysmal atrial fibrillation, moderate aortic insufficiency, chronic combined systolic and diastolic heart failure, carotid artery disease, cerebral aneurysm, hypertension, hyperlipidemia, CKD, and prostate cancer who presents for follow-up related to atrial fibrillation, CAD, heart failure, and aortic insufficiency. ? ?Past Medical History  ?  ?Past Medical History:  ?Diagnosis Date  ? Angina   ? Aortic valve insufficiency, moderate   ? a. 02/2016 Echo: mild AS, mild to mod AI.  ? Arthritis   ? Arthritis pain   ? CAD (coronary artery disease)   ? a. 09/2010 Cath: RCA 40-60 ost, otw nl cors;  b. 03/2016 Lexiscan MV: EF 46%, no ischemia, low risk.  ? Cancer [facility])   ? Carotid disease, bilateral ([place])   ? a. 02/2016 Carotid U/S: 1-39% bilat ICA plaquing.  ? Cerebral aneurysm   ? a. 02/2016 MRA: fusiform bilobed aneurysm @ R MCA bifurcation.  ? Cerebrovascular disease   ? a. 02/2016 MRA: long segment narrowing of basilar artery, moderate bilat posterior inferior cerebellar artery dzs.  ? Chronic combined systolic and diastolic CHF (congestive heart failure) ([place])   ? a. 02/2016 Admitted to hospital in [place] w/ CHF;  b. 02/2016 Echo ([facility]): EF 45-50%, diff HK, gr1DD, mild AS, mild to mod AI, mildly dil Ao root, PASP 76mHg.  ? CKD (chronic kidney disease), stage III ([place]   ? Dysrhythmia   ? Essential hypertension   ? Hard of hearing   ? Hemiparesis and alteration of sensations as late effects of stroke ([place] 04/29/2015  ? Hiatal hernia   ? History of kidney stones   ? HOH (hard of hearing)   ? Hyperlipidemia   ? Kidney stones   ? Moderate aortic insufficiency   ? NICM (nonischemic cardiomyopathy) ([place]   ? a. 02/2016 Echo: EF 45-50%, diff HK;  b. 03/2016 Lexiscan MV: No ischemia, EF  46%.  ? NSVT (nonsustained ventricular tachycardia)   ? a. 03/2016 noted on event monitor.  ? PAF (paroxysmal atrial fibrillation) ([place]   ? a. 2012 - noted in setting of E coli bacteremia and septic shock.  ? Pneumonia 03/23/2011  ? "first time"  ? [place] cancer [place]   ? Sepsis due to Escherichia coli ([place] 03/24/11  ? Syncope   ? a. 02/2016 presumed to be 2/2 hypotension/dehydration and basilar artery dzs noted on MRA.  ? TIA (transient ischemic attack)   ? Vertebrobasilar insufficiency 04/28/2016  ? Visual disturbance 03/19/2014  ? ?Past Surgical History:  ?Procedure Laterality Date  ? CARDIAC CATHETERIZATION  09/2010  ? non obstructive CAD, 40-60% ostial RCA stenosis without dampening   ? CATARACT EXTRACTION Bilateral   ? cerebral angiography    ? CHOLECYSTECTOMY  04/15/2011  ? Procedure: LAPAROSCOPIC CHOLECYSTECTOMY WITH INTRAOPERATIVE CHOLANGIOGRAM;  Surgeon: JBelva Crome MD;  [facility]  Service: General;  Laterality: N/A;  ? EXTRACORPOREAL SHOCK WAVE LITHOTRIPSY Left 12/12/2016  ? Procedure: LEFT EXTRACORPOREAL SHOCK WAVE LITHOTRIPSY (ESWL);  Surgeon: HArdis Hughs MD;  Location: [facility];  Service: Urology;  Laterality: Left;  ? IR ANGIO INTRA EXTRACRAN SEL COM CAROTID INNOMINATE BILAT MOD SED  03/21/2019  ? IR ANGIO VERTEBRAL SEL SUBCLAVIAN INNOMINATE BILAT MOD SED  03/21/2019  ? IR GENERIC HISTORICAL  03/17/2016  ? IR  ANGIO VERTEBRAL SEL SUBCLAVIAN INNOMINATE BILAT MOD SED 03/17/2016 [name], MD [facility]  ? IR GENERIC HISTORICAL  03/17/2016  ? IR ANGIO INTRA EXTRACRAN SEL COM CAROTID INNOMINATE BILAT MOD SED 03/17/2016 [name], MD [facility]  ? PROSTATE SURGERY    ? approx 10 years ago, seed implant radiation  ? TONSILLECTOMY    ? ? ?Allergies ? ?Allergies  ?Allergen Reactions  ? Amoxicillin-Pot Clavulanate   ?  Other reaction(s): Dizziness  ? ? ?History of Present Illness  ? ?88 year old male with the above past medical history including CAD, paroxysmal atrial  fibrillation, moderate aortic insufficiency, chronic combined systolic and diastolic heart failure, carotid artery disease, cerebral aneurysm, hypertension, hyperlipidemia, CKD, and prostate cancer. ? ?Catheterization in June 2012 showed nonobstructive CAD.  He has a history of paroxysmal atrial fibrillation in the setting of sepsis in 2012.  He was hospitalized in November 2017 in the setting of dyspnea and volume overload.  Echo at the time showed EF 45 to 50%, diffuse hypokinesis.  He was hospitalized in November 2017 following a syncopal episode CT of the head was negative for acute stroke, MRI and MRA showed diffuse severe vascular disease with long segment basilar arterial disease and right MCA fusiform aneurysm. Medical therapy was recommended.  Carotid ultrasound showed minimal disease.  Beta-blocker and ACE inhibitor were discontinued in the setting of hypotension.  Lasix was advised as needed.  He subsequently underwent outpatient event monitoring and stress testing. Monitor showed 7 beat run of nonsustained V. tach, he was asymptomatic. Beta-blocker therapy was resumed.  Stress test was negative for ischemia. Most recent echocardiogram in June 2021 showed EF 40 to 45%, global hypokinesis, LVH of the basal septal segment, G1 DD, mild to moderate mitral valve regurgitation, moderate aortic valve regurgitation, moderate aortic valve stenosis with mean gradient 22 mmHg. He was later hospitalized in [place] in May 2022 with pneumonia, atrial fibrillation, and heart failure. He was diuresed and placed on antibiotics.  Per notes, echo at the time showed EF 30%, severe MR. ? ?He was last seen in the office on 12/08/2020. EKG at the time showed A-fib with RVR. He was asymptomatic.  Metoprolol was increased. Additionally, ongoing routine monitoring of valvular heart disease was not recommended, patient was agreeable to this. He presents today for follow-up accompanied by his daughter. Since his last visit he has  done well from a cardiac standpoint.  He denies any dyspnea, edema, PND, orthopnea. Denies dizziness, palpitations, presyncope, syncope. He denies symptoms concerning for angina. His daughter is asking about the status of his Xarelto prescription that was supposed to be sent to a mail order pharmacy per Dr. Kennon Holter nurse. He was previously denied patient assistance, and this was determined to be the most affordable option going forward. He is getting ready to travel to [place] does not have enough medication to last him during his trip. He does not think his new prescription has been sent to the mail order pharmacy.  He is asking for samples of Xarelto to use until he receives his new prescription via mail order. Otherwise, he reports feeling very well and denies any specific concerns today. ? ?Home Medications  ?  ?Current Outpatient Medications  ?Medication Sig Dispense Refill  ? B Complex-C (SUPER B COMPLEX PO) Take 1 tablet by mouth daily.    ? calcium carbonate (OSCAL) 1500 (600 Ca) MG TABS tablet Take 600 mg of elemental calcium by mouth daily with breakfast.    ? ciprofloxacin (CIPRO) 500  MG tablet Take 1 tablet by mouth 2 times a day 14 tablet 0  ? Cyanocobalamin 5000 MCG TBDP Take 5,000 mcg by mouth daily. Vitamin B12    ? feeding supplement, ENSURE ENLIVE, (ENSURE ENLIVE) LIQD Take 237 mLs by mouth 2 (two) times daily between meals. 237 mL 12  ? furosemide (LASIX) 20 MG tablet TAKE 1 TABLET BY MOUTH DAILY. 90 tablet 4  ? metoprolol succinate (TOPROL-XL) 25 MG 24 hr tablet Take 1 tablet (25 mg) by mouth in the morning, and 1/2 tablet (12.5 mg) by mouth in the evening. 90 tablet 3  ? Multiple Vitamins-Minerals (MULTIVITAMIN WITH MINERALS) tablet Take 1 tablet by mouth daily.    ? Omega-3 Fatty Acids (FISH OIL PO) Take 1,400 mg by mouth at bedtime.    ? Propylene Glycol (SYSTANE BALANCE OP) Place 1 drop into both eyes 2 (two) times daily.    ? Rivaroxaban (XARELTO) 15 MG TABS tablet Take 1 tablet (15 mg  total) by mouth daily. 28 tablet 0  ? rosuvastatin (CRESTOR) 10 MG tablet TAKE 1 TABLET BY MOUTH DAILY. 90 tablet 1  ? tamsulosin (FLOMAX) 0.4 MG CAPS capsule Take 1 capsule (0.4 mg total) by mouth daily. 30 capsu

## 2021-07-16 ENCOUNTER — Other Ambulatory Visit: Payer: Self-pay | Admitting: Cardiovascular Disease

## 2021-07-16 ENCOUNTER — Other Ambulatory Visit: Payer: Self-pay

## 2021-07-16 ENCOUNTER — Other Ambulatory Visit (HOSPITAL_COMMUNITY): Payer: Self-pay

## 2021-07-16 DIAGNOSIS — H9193 Unspecified hearing loss, bilateral: Secondary | ICD-10-CM | POA: Diagnosis not present

## 2021-07-16 DIAGNOSIS — I480 Paroxysmal atrial fibrillation: Secondary | ICD-10-CM | POA: Diagnosis not present

## 2021-07-16 DIAGNOSIS — Z0000 Encounter for general adult medical examination without abnormal findings: Secondary | ICD-10-CM | POA: Diagnosis not present

## 2021-07-16 DIAGNOSIS — D6869 Other thrombophilia: Secondary | ICD-10-CM | POA: Diagnosis not present

## 2021-07-16 DIAGNOSIS — D638 Anemia in other chronic diseases classified elsewhere: Secondary | ICD-10-CM | POA: Diagnosis not present

## 2021-07-16 DIAGNOSIS — R296 Repeated falls: Secondary | ICD-10-CM | POA: Diagnosis not present

## 2021-07-16 DIAGNOSIS — N1832 Chronic kidney disease, stage 3b: Secondary | ICD-10-CM | POA: Diagnosis not present

## 2021-07-16 MED ORDER — METOPROLOL SUCCINATE ER 25 MG PO TB24
ORAL_TABLET | ORAL | 3 refills | Status: DC
  Filled 2021-07-16: qty 45, 30d supply, fill #0
  Filled 2021-08-31: qty 45, 30d supply, fill #1
  Filled 2021-09-24: qty 45, 30d supply, fill #2
  Filled 2021-10-22: qty 45, 30d supply, fill #3
  Filled 2021-11-24: qty 45, 30d supply, fill #4
  Filled 2021-12-27: qty 45, 30d supply, fill #5
  Filled 2022-02-01: qty 45, 30d supply, fill #6
  Filled 2022-03-14: qty 45, 30d supply, fill #7

## 2021-07-16 NOTE — Telephone Encounter (Signed)
Refilled Metoprolol 25 mg ok per Diona Browner, NP.  ?

## 2021-07-30 ENCOUNTER — Telehealth: Payer: Self-pay

## 2021-07-30 NOTE — Telephone Encounter (Signed)
Called daughter, [name] (ok per DPR) regarding [employer] Website given for daughter to do registration portion. [name] verbalizes understanding.  ?

## 2021-08-09 DIAGNOSIS — H9313 Tinnitus, bilateral: Secondary | ICD-10-CM | POA: Diagnosis not present

## 2021-08-09 DIAGNOSIS — Z822 Family history of deafness and hearing loss: Secondary | ICD-10-CM | POA: Diagnosis not present

## 2021-08-09 DIAGNOSIS — Z570 Occupational exposure to noise: Secondary | ICD-10-CM | POA: Diagnosis not present

## 2021-08-09 DIAGNOSIS — H903 Sensorineural hearing loss, bilateral: Secondary | ICD-10-CM | POA: Diagnosis not present

## 2021-08-10 NOTE — Telephone Encounter (Signed)
? ?  Pt's daughter calling back, she needs help filling out the pr assistance form  ?

## 2021-08-10 NOTE — Telephone Encounter (Signed)
Spoke with [name], daughter. Who reports she is having difficulty filling out application online. Advised she print application, fill out patient portion, and then bring to office for MD to sign off on and to be faxed.  ?

## 2021-08-12 ENCOUNTER — Telehealth: Payer: Self-pay

## 2021-08-12 NOTE — Telephone Encounter (Signed)
Faxed patient assistance form to [employer]. [phone] ?

## 2021-08-31 ENCOUNTER — Other Ambulatory Visit (HOSPITAL_COMMUNITY): Payer: Self-pay

## 2021-09-09 DIAGNOSIS — I10 Essential (primary) hypertension: Secondary | ICD-10-CM | POA: Diagnosis not present

## 2021-09-09 DIAGNOSIS — N1832 Chronic kidney disease, stage 3b: Secondary | ICD-10-CM | POA: Diagnosis not present

## 2021-09-09 DIAGNOSIS — I5032 Chronic diastolic (congestive) heart failure: Secondary | ICD-10-CM | POA: Diagnosis not present

## 2021-09-09 DIAGNOSIS — E785 Hyperlipidemia, unspecified: Secondary | ICD-10-CM | POA: Diagnosis not present

## 2021-09-15 ENCOUNTER — Telehealth: Payer: Self-pay | Admitting: Cardiovascular Disease

## 2021-09-15 ENCOUNTER — Other Ambulatory Visit (HOSPITAL_COMMUNITY): Payer: Self-pay

## 2021-09-15 MED ORDER — AMOXICILLIN 500 MG PO CAPS
ORAL_CAPSULE | ORAL | 0 refills | Status: DC
  Filled 2021-09-15: qty 21, 7d supply, fill #0

## 2021-09-15 MED ORDER — HYDROCODONE-ACETAMINOPHEN 5-325 MG PO TABS
ORAL_TABLET | ORAL | 0 refills | Status: DC
  Filled 2021-09-15: qty 12, 3d supply, fill #0

## 2021-09-15 NOTE — Telephone Encounter (Signed)
Patient calling the office for samples of medication:   1.  What medication and dosage are you requesting samples for? Rivaroxaban (XARELTO) 15 MG TABS tablet  2.  Are you currently out of this medication? No  Patient has 7 days left.   The patient's wife said they have not heard anything from [employer] regarding the application

## 2021-09-16 ENCOUNTER — Telehealth: Payer: Self-pay | Admitting: Pharmacist

## 2021-09-16 DIAGNOSIS — I480 Paroxysmal atrial fibrillation: Secondary | ICD-10-CM

## 2021-09-16 MED ORDER — RIVAROXABAN 15 MG PO TABS: 15.0000 mg | ORAL_TABLET | Freq: Every day | ORAL | 0 refills | Status: DC

## 2021-09-16 NOTE — Telephone Encounter (Signed)
Patient's daughter walked in.  Reports has not heard from company regarding patient assistance yet.  Will dispense another month of samples

## 2021-09-23 ENCOUNTER — Other Ambulatory Visit (HOSPITAL_COMMUNITY): Payer: Self-pay

## 2021-09-24 ENCOUNTER — Other Ambulatory Visit (HOSPITAL_COMMUNITY): Payer: Self-pay

## 2021-10-05 ENCOUNTER — Telehealth: Payer: Self-pay | Admitting: Pharmacist

## 2021-10-05 DIAGNOSIS — I480 Paroxysmal atrial fibrillation: Secondary | ICD-10-CM

## 2021-10-05 MED ORDER — RIVAROXABAN 15 MG PO TABS: 15.0000 mg | ORAL_TABLET | Freq: Every day | ORAL | 0 refills | Status: DC

## 2021-10-05 NOTE — Telephone Encounter (Signed)
Patient's daughter walked in. Had difficulties signing up for [facility] select program. Tried to assist her.  It reports patient is not eligible since he does not have prescription insurance.  Gave samples of Xarelto '15mg'$ .   Advised patient should try Eliquis patient assistance. Printed application for patient. She will bring back in for Dr [name] to sign.

## 2021-10-14 DIAGNOSIS — D485 Neoplasm of uncertain behavior of skin: Secondary | ICD-10-CM | POA: Diagnosis not present

## 2021-10-14 DIAGNOSIS — L821 Other seborrheic keratosis: Secondary | ICD-10-CM | POA: Diagnosis not present

## 2021-10-14 DIAGNOSIS — C4442 Squamous cell carcinoma of skin of scalp and neck: Secondary | ICD-10-CM | POA: Diagnosis not present

## 2021-10-14 DIAGNOSIS — L814 Other melanin hyperpigmentation: Secondary | ICD-10-CM | POA: Diagnosis not present

## 2021-10-14 DIAGNOSIS — D225 Melanocytic nevi of trunk: Secondary | ICD-10-CM | POA: Diagnosis not present

## 2021-10-18 ENCOUNTER — Other Ambulatory Visit (HOSPITAL_COMMUNITY): Payer: Self-pay

## 2021-10-18 ENCOUNTER — Telehealth: Payer: Self-pay

## 2021-10-18 NOTE — Telephone Encounter (Signed)
Spoke with pt's daughter, [name] (ok per [facility]) regarding Eliquis pt assistance. Explained that we need a print out from their pharmacy showing all prescriptions paid for this year. Daughter will call to see if pharmacy will fax over a copy, if they are unable to fax she will pick it up and get it over to [place]. Fax number provided. Daughter verbalizes understanding.

## 2021-10-22 ENCOUNTER — Other Ambulatory Visit (HOSPITAL_COMMUNITY): Payer: Self-pay

## 2021-10-25 ENCOUNTER — Other Ambulatory Visit (HOSPITAL_COMMUNITY): Payer: Self-pay

## 2021-10-28 ENCOUNTER — Other Ambulatory Visit (HOSPITAL_COMMUNITY): Payer: Self-pay

## 2021-10-28 MED ORDER — FLUOROURACIL 5 % EX CREA
TOPICAL_CREAM | CUTANEOUS | 1 refills | Status: DC
  Filled 2021-10-28: qty 40, 30d supply, fill #0

## 2021-11-12 ENCOUNTER — Other Ambulatory Visit (HOSPITAL_COMMUNITY): Payer: Self-pay

## 2021-11-22 ENCOUNTER — Other Ambulatory Visit: Payer: Self-pay

## 2021-11-22 MED ORDER — APIXABAN 5 MG PO TABS: 5.0000 mg | ORAL_TABLET | Freq: Two times a day (BID) | ORAL | 3 refills | Status: DC

## 2021-11-24 ENCOUNTER — Other Ambulatory Visit (HOSPITAL_COMMUNITY): Payer: Self-pay

## 2021-11-24 DIAGNOSIS — R3121 Asymptomatic microscopic hematuria: Secondary | ICD-10-CM | POA: Diagnosis not present

## 2021-11-24 DIAGNOSIS — N401 Enlarged prostate with lower urinary tract symptoms: Secondary | ICD-10-CM | POA: Diagnosis not present

## 2021-11-24 DIAGNOSIS — R3914 Feeling of incomplete bladder emptying: Secondary | ICD-10-CM | POA: Diagnosis not present

## 2021-11-24 MED ORDER — FINASTERIDE 5 MG PO TABS
ORAL_TABLET | ORAL | 3 refills | Status: DC
  Filled 2021-11-24: qty 90, 90d supply, fill #0
  Filled 2022-02-01: qty 90, 90d supply, fill #1

## 2021-11-26 ENCOUNTER — Other Ambulatory Visit (HOSPITAL_COMMUNITY): Payer: Self-pay

## 2021-11-26 ENCOUNTER — Telehealth: Payer: Self-pay | Admitting: Cardiovascular Disease

## 2021-11-26 MED ORDER — CEPHALEXIN 500 MG PO CAPS
ORAL_CAPSULE | ORAL | 0 refills | Status: DC
  Filled 2021-11-26: qty 14, 7d supply, fill #0

## 2021-11-26 NOTE — Telephone Encounter (Signed)
Pt c/o medication issue:  1. Name of Medication: apixaban (ELIQUIS) 5 MG TABS tablet  2. How are you currently taking this medication (dosage and times per day)? 1 tablet twice a day  3. Are you having a reaction (difficulty breathing--STAT)? no  4. What is your medication issue? Patient's daughter says the patient's patient assistance form needs the patient's address of where it would be sent to. She says she tried to give it, but they would not accept it from her. She requests the office call [facility], Phone: [phone]

## 2021-11-26 NOTE — Telephone Encounter (Signed)
Called and spoke with patients daughter (okay per DPR) who states that the box on application for Eliquis regarding where medication should be sent to either patients home or office was unchecked. And states that the company  needs [place] to update it so that application can be processed. Advised patients daughter I would call the company. Patients daughter also wants to make Dr. [name] aware that patient was started on Finasteride recently by urologist- advised her that I would forward message to Dr. [name] to make him aware.   Called and spoke with patient assistance foundation- advised them to send prescription to patients home (gave them patients current address). They stated that they will send application to be processed and will take 3-5 business days.   Attempted to call patients daughter back but unable to reach- left message (okay per [facility]) stating that application will be processed and will take 3-5 days. Advised on message to call back if samples are needed before patient runs out. Call back number given.

## 2021-12-01 NOTE — Telephone Encounter (Signed)
Confirmation received from [facility] that pt is approved for coverage for the remainder of the year.

## 2021-12-02 DIAGNOSIS — R3121 Asymptomatic microscopic hematuria: Secondary | ICD-10-CM | POA: Diagnosis not present

## 2021-12-02 DIAGNOSIS — N281 Cyst of kidney, acquired: Secondary | ICD-10-CM | POA: Diagnosis not present

## 2021-12-02 DIAGNOSIS — N200 Calculus of kidney: Secondary | ICD-10-CM | POA: Diagnosis not present

## 2021-12-13 ENCOUNTER — Other Ambulatory Visit (HOSPITAL_COMMUNITY): Payer: Self-pay

## 2021-12-13 DIAGNOSIS — N401 Enlarged prostate with lower urinary tract symptoms: Secondary | ICD-10-CM | POA: Diagnosis not present

## 2021-12-13 DIAGNOSIS — R300 Dysuria: Secondary | ICD-10-CM | POA: Diagnosis not present

## 2021-12-13 DIAGNOSIS — R3914 Feeling of incomplete bladder emptying: Secondary | ICD-10-CM | POA: Diagnosis not present

## 2021-12-13 MED ORDER — DOXYCYCLINE HYCLATE 100 MG PO CAPS
ORAL_CAPSULE | ORAL | 0 refills | Status: DC
  Filled 2021-12-13: qty 14, 7d supply, fill #0

## 2021-12-27 ENCOUNTER — Other Ambulatory Visit: Payer: Self-pay | Admitting: Cardiovascular Disease

## 2021-12-27 ENCOUNTER — Other Ambulatory Visit (HOSPITAL_COMMUNITY): Payer: Self-pay

## 2021-12-27 MED ORDER — ROSUVASTATIN CALCIUM 10 MG PO TABS
ORAL_TABLET | Freq: Every day | ORAL | 3 refills | Status: DC
  Filled 2021-12-27: qty 90, 90d supply, fill #0
  Filled 2022-03-28: qty 90, 90d supply, fill #1
  Filled 2022-05-26 – 2022-07-01 (×2): qty 90, 90d supply, fill #2
  Filled 2022-10-06: qty 90, 90d supply, fill #3

## 2022-01-04 ENCOUNTER — Other Ambulatory Visit (HOSPITAL_COMMUNITY): Payer: Self-pay

## 2022-01-11 DIAGNOSIS — Z23 Encounter for immunization: Secondary | ICD-10-CM | POA: Diagnosis not present

## 2022-01-12 ENCOUNTER — Other Ambulatory Visit (HOSPITAL_COMMUNITY): Payer: Self-pay

## 2022-01-12 ENCOUNTER — Ambulatory Visit: Payer: Medicare Other | Attending: Cardiovascular Disease | Admitting: Cardiovascular Disease

## 2022-01-12 ENCOUNTER — Encounter: Payer: Self-pay | Admitting: Cardiovascular Disease

## 2022-01-12 DIAGNOSIS — I480 Paroxysmal atrial fibrillation: Secondary | ICD-10-CM | POA: Diagnosis not present

## 2022-01-12 DIAGNOSIS — I351 Nonrheumatic aortic (valve) insufficiency: Secondary | ICD-10-CM | POA: Diagnosis not present

## 2022-01-12 DIAGNOSIS — E785 Hyperlipidemia, unspecified: Secondary | ICD-10-CM

## 2022-01-12 DIAGNOSIS — I10 Essential (primary) hypertension: Secondary | ICD-10-CM | POA: Diagnosis not present

## 2022-01-12 NOTE — Progress Notes (Signed)
01/12/2022 Richard Reed   March 22, 1926  [id number]  Primary Physician [name], FNP Primary Cardiologist: Lorretta Harp MD [facility], [place], [place]  HPI:  Richard Reed is a 88 y.o.     thin-appearing married Caucasian male, father of 42, grandfather to 80 grandchildren, who I last saw in the office 12/08/2020. He is accompanied by his daughter [name].  He was hospitalized late 2012 with E. coli bacteremia and septic shock. He did develop some arrhythmias with PAF during that hospitalization. His EF was 45% to 50% with moderate aortic insufficiency. His other problems include hypertension and hyperlipidemia. I catheterized him in June 2012, revealing normal coronary arteries, with 40% to 60% ostial right coronary artery stenosis without damping. His right heart catheterization was unremarkable. He has had an uncomplicated laparoscopic cholecystectomy performed by Dr. Chucky May, April 12, 2011.  His most recent lab work performed 04/25/16 revealed total cholesterol 99, LDL of 47 and HDL 39. He was admitted to the hospital in [place] in November of last year with congestive heart failure. He is begun on diuretics. He admits that this was probably related to dietary indiscretion. He did have an episode of syncope substantive thought to be related to dehydration. Workup included carotid Dopplers that were unremarkable. Cerebral angiography did reveal an MCA aneurysm as well as a stenosis at the vertebrobasilar junction demonstrated by angiography performed by Dr. Patrecia Pour. Medical therapy was recommended. A 2-D echo revealed low normal LV function with EF of 50%, mild AS and moderate AI with mild elevation of pulmonary artery pressures and Myoview stress test was low risk as well. Since adjusting his diet he has become for  the most part asymptomatic. He did see Ignacia Bayley NP in the office 07/20/16 which time he was cardiac stable what was complaining of some left hip pain with ambulation. They  did change the insoles and shoes which somewhat helped and try to statin holiday which did not impact his pain and therefore was placed back on statin therapy.   He  was hospitalized in [place] from 5/7 through the 13th of this month with pneumonia, A. fib and heart failure.  He was diuresed.  He is placed antibiotics.  2D echo revealed EF of 30% with severe MR. He does have aortic insufficiency and aortic stenosis as well.  He was decided not to pursue follow-up 2D echocardiograms.   Since I saw him a year ago he continues to do well.  He is still living independently with his wife.  He is ambulatory, denying chest pain or shortness of breath..   Current Meds  Medication Sig   apixaban (ELIQUIS) 5 MG TABS tablet Take 1 tablet (5 mg total) by mouth 2 (two) times daily.   B Complex-C (SUPER B COMPLEX PO) Take 1 tablet by mouth daily.   calcium carbonate (OSCAL) 1500 (600 Ca) MG TABS tablet Take 600 mg of elemental calcium by mouth daily with breakfast.   feeding supplement, ENSURE ENLIVE, (ENSURE ENLIVE) LIQD Take 237 mLs by mouth 2 (two) times daily between meals.   finasteride (PROSCAR) 5 MG tablet Take 1 tablet by mouth daily   fluorouracil (EFUDEX) 5 % cream Apply to the affected area twice a day on the scalp x 4 weeks as tolerated   furosemide (LASIX) 20 MG tablet TAKE 1 TABLET BY MOUTH DAILY.   metoprolol succinate (TOPROL XL) 25 MG 24 hr tablet Take 1 tablet (25 mg) by mouth in the morning,  and 1/2 tablet (12.5 mg) by mouth in the evening. (Patient taking differently: Take 1/2 tablet (25 mg) by mouth in the morning, and 1 tablet (12.5 mg) by mouth in the evening.)   Multiple Vitamins-Minerals (MULTIVITAMIN WITH MINERALS) tablet Take 1 tablet by mouth daily.   Propylene Glycol (SYSTANE BALANCE OP) Place 1 drop into both eyes 2 (two) times daily.   rosuvastatin (CRESTOR) 10 MG tablet TAKE 1 TABLET BY MOUTH DAILY.   tamsulosin (FLOMAX) 0.4 MG CAPS capsule Take 1 capsule (0.4 mg total) by mouth  daily.   tamsulosin (FLOMAX) 0.4 MG CAPS capsule Take 1 capsule by mouth at bedtime.     Allergies  Allergen Reactions   Amoxicillin-Pot Clavulanate Other (See Comments)    Other reaction(s): Dizziness    Social History   Socioeconomic History   Marital status: Married    Spouse name: Not on file   Number of children: 2   Years of education: HS   Highest education level: Not on file  Occupational History   Occupation: Retired  Tobacco Use   Smoking status: Never   Smokeless tobacco: Never  Vaping Use   Vaping Use: Never used  Substance and Sexual Activity   Alcohol use: No   Drug use: No   Sexual activity: Yes  Other Topics Concern   Not on file  Social History Narrative   Medical Decision Maker:  KAMERIN GRUMBINE (wife) Cell. [place]: [phone].   Daughter: Nigel Sloop: Cell [phone]   Full Code   Patient is Left handed.   Patient drinks 1 cup of caffeine daily.   Patient is left handed.   Social Determinants of Health   Financial Resource Strain: Not on file  Food Insecurity: Not on file  Transportation Needs: Not on file  Physical Activity: Not on file  Stress: Not on file  Social Connections: Not on file  Intimate Partner Violence: Not on file     Review of Systems: General: negative for chills, fever, night sweats or weight changes.  Cardiovascular: negative for chest pain, dyspnea on exertion, edema, orthopnea, palpitations, paroxysmal nocturnal dyspnea or shortness of breath Dermatological: negative for rash Respiratory: negative for cough or wheezing Urologic: negative for hematuria Abdominal: negative for nausea, vomiting, diarrhea, bright red blood per rectum, melena, or hematemesis Neurologic: negative for visual changes, syncope, or dizziness All other systems reviewed and are otherwise negative except as noted above.    Blood pressure (!) 108/56, pulse 76, height '5\' 7"'$  (1.702 m), weight 143 lb 6.4 oz (65 kg), SpO2 94 %.   General appearance: alert and no distress Neck: no adenopathy, no carotid bruit, no JVD, supple, symmetrical, trachea midline, and thyroid not enlarged, symmetric, no tenderness/mass/nodules Lungs: clear to auscultation bilaterally Heart: irregularly irregular rhythm and 2/6 outflow tract murmur consistent with aortic stenosis. Extremities: extremities normal, atraumatic, no cyanosis or edema Pulses: 2+ and symmetric Skin: Skin color, texture, turgor normal. No rashes or lesions Neurologic: Grossly normal  EKG atrial fibrillation with a ventricular sponsor 76 and occasional aberrantly conducted beats.  I personally reviewed this EKG.  ASSESSMENT AND PLAN:   PAF (paroxysmal atrial fibrillation) (HCC) History of persistent atrial fibrillation on Eliquis oral anticoagulation, rate controlled.  Aortic valve insufficiency, moderate History of valvular heart disease with moderate AI, AS and MR with moderate LV dysfunction.  He is completely asymptomatic.  We have decided together not to continue to follow his 2D echocardiogram.  Essential hypertension History of essential hypertension blood pressure measured today  at 108/56.  He is on metoprolol.  Dyslipidemia By history of dyslipidemia on low-dose statin therapy with lipid profile performed 07/16/2021 revealing total cholesterol of 116, LDL 54 and HDL 47.     Lorretta Harp MD [facility], [facility] 01/12/2022 3:57 PM

## 2022-01-12 NOTE — Assessment & Plan Note (Signed)
History of valvular heart disease with moderate AI, AS and MR with moderate LV dysfunction.  He is completely asymptomatic.  We have decided together not to continue to follow his 2D echocardiogram.

## 2022-01-12 NOTE — Assessment & Plan Note (Signed)
By history of dyslipidemia on low-dose statin therapy with lipid profile performed 07/16/2021 revealing total cholesterol of 116, LDL 54 and HDL 47.

## 2022-01-12 NOTE — Patient Instructions (Signed)
Medication Instructions:  No changes *If you need a refill on your cardiac medications before your next appointment, please call your pharmacy*   Lab Work: None ordered If you have labs (blood work) drawn today and your tests are completely normal, you will receive your results only by: [place] (if you have MyChart) OR A paper copy in the mail If you have any lab test that is abnormal or we need to change your treatment, we will call you to review the results.   Testing/Procedures: None ordered   Follow-Up: At [facility], you and your health needs are our priority.  As part of our continuing mission to provide you with exceptional heart care, we have created designated Provider Care Teams.  These Care Teams include your primary Cardiologist (physician) and Advanced Practice Providers (APPs -  Physician Assistants and Nurse Practitioners) who all work together to provide you with the care you need, when you need it.  We recommend signing up for the patient portal called "MyChart".  Sign up information is provided on this After Visit Summary.  MyChart is used to connect with patients for Virtual Visits (Telemedicine).  Patients are able to view lab/test results, encounter notes, upcoming appointments, etc.  Non-urgent messages can be sent to your provider as well.   To learn more about what you can do with MyChart, go to [email or web address].    Your next appointment:   12 month(s)  The format for your next appointment:   In Person  Provider:   Quay Burow, MD

## 2022-01-12 NOTE — Assessment & Plan Note (Signed)
History of essential hypertension blood pressure measured today at 108/56.  He is on metoprolol.

## 2022-01-12 NOTE — Assessment & Plan Note (Signed)
History of persistent atrial fibrillation on Eliquis oral anticoagulation, rate controlled.

## 2022-02-01 ENCOUNTER — Other Ambulatory Visit (HOSPITAL_COMMUNITY): Payer: Self-pay

## 2022-02-01 DIAGNOSIS — L814 Other melanin hyperpigmentation: Secondary | ICD-10-CM | POA: Diagnosis not present

## 2022-02-01 DIAGNOSIS — L821 Other seborrheic keratosis: Secondary | ICD-10-CM | POA: Diagnosis not present

## 2022-02-01 DIAGNOSIS — Z08 Encounter for follow-up examination after completed treatment for malignant neoplasm: Secondary | ICD-10-CM | POA: Diagnosis not present

## 2022-02-01 DIAGNOSIS — Z86007 Personal history of in-situ neoplasm of skin: Secondary | ICD-10-CM | POA: Diagnosis not present

## 2022-02-02 ENCOUNTER — Other Ambulatory Visit (HOSPITAL_COMMUNITY): Payer: Self-pay

## 2022-02-24 ENCOUNTER — Other Ambulatory Visit (HOSPITAL_COMMUNITY): Payer: Self-pay

## 2022-02-24 DIAGNOSIS — N401 Enlarged prostate with lower urinary tract symptoms: Secondary | ICD-10-CM | POA: Diagnosis not present

## 2022-02-24 DIAGNOSIS — R3914 Feeling of incomplete bladder emptying: Secondary | ICD-10-CM | POA: Diagnosis not present

## 2022-02-24 MED ORDER — TAMSULOSIN HCL 0.4 MG PO CAPS
0.4000 mg | ORAL_CAPSULE | Freq: Every day | ORAL | 3 refills | Status: DC
  Filled 2022-02-24: qty 90, 90d supply, fill #0
  Filled 2022-05-26 (×2): qty 90, 90d supply, fill #1
  Filled 2022-08-17: qty 30, 30d supply, fill #2
  Filled 2022-10-06: qty 30, 30d supply, fill #3
  Filled 2022-11-02: qty 90, 90d supply, fill #4
  Filled 2022-11-02: qty 30, 30d supply, fill #4
  Filled 2023-02-11: qty 30, 30d supply, fill #5

## 2022-02-24 MED ORDER — FINASTERIDE 5 MG PO TABS
5.0000 mg | ORAL_TABLET | Freq: Every day | ORAL | 3 refills | Status: DC
  Filled 2022-02-24: qty 90, 90d supply, fill #0
  Filled 2022-05-26 – 2022-07-19 (×2): qty 90, 90d supply, fill #1
  Filled 2022-10-06: qty 90, 90d supply, fill #2
  Filled 2023-01-09: qty 90, 90d supply, fill #3

## 2022-03-02 DIAGNOSIS — D3131 Benign neoplasm of right choroid: Secondary | ICD-10-CM | POA: Diagnosis not present

## 2022-03-02 DIAGNOSIS — H04123 Dry eye syndrome of bilateral lacrimal glands: Secondary | ICD-10-CM | POA: Diagnosis not present

## 2022-03-02 DIAGNOSIS — H0102B Squamous blepharitis left eye, upper and lower eyelids: Secondary | ICD-10-CM | POA: Diagnosis not present

## 2022-03-02 DIAGNOSIS — H0102A Squamous blepharitis right eye, upper and lower eyelids: Secondary | ICD-10-CM | POA: Diagnosis not present

## 2022-03-07 ENCOUNTER — Other Ambulatory Visit (HOSPITAL_COMMUNITY): Payer: Self-pay

## 2022-03-14 ENCOUNTER — Other Ambulatory Visit (HOSPITAL_COMMUNITY): Payer: Self-pay

## 2022-03-28 ENCOUNTER — Telehealth: Payer: Self-pay | Admitting: Cardiovascular Disease

## 2022-03-28 ENCOUNTER — Other Ambulatory Visit (HOSPITAL_COMMUNITY): Payer: Self-pay

## 2022-03-28 ENCOUNTER — Other Ambulatory Visit: Payer: Self-pay | Admitting: Nurse Practitioner

## 2022-03-28 NOTE — Telephone Encounter (Signed)
Spoke with daughter regarding BMS for eliquis for next year. She stated she will drop of packet tomorrow, so our clinic can submit it before 03/30/22.

## 2022-03-28 NOTE — Telephone Encounter (Signed)
Pt c/o medication issue:  1. Name of Medication:   apixaban (ELIQUIS) 5 MG TABS tablet    2. How are you currently taking this medication (dosage and times per day)? Take 1 tablet (5 mg total) by mouth 2 (two) times daily.   3. Are you having a reaction (difficulty breathing--STAT)? No  4. What is your medication issue?  Pt's daughter would like a callback regarding paperwork from [facility] for medication. Please advise

## 2022-03-29 ENCOUNTER — Other Ambulatory Visit (HOSPITAL_COMMUNITY): Payer: Self-pay

## 2022-03-29 MED ORDER — METOPROLOL SUCCINATE ER 25 MG PO TB24
ORAL_TABLET | ORAL | 3 refills | Status: DC
  Filled 2022-03-29 – 2022-03-30 (×2): qty 90, 60d supply, fill #0

## 2022-03-30 ENCOUNTER — Other Ambulatory Visit (HOSPITAL_COMMUNITY): Payer: Self-pay

## 2022-03-31 ENCOUNTER — Other Ambulatory Visit (HOSPITAL_COMMUNITY): Payer: Self-pay

## 2022-04-04 ENCOUNTER — Other Ambulatory Visit: Payer: Self-pay

## 2022-04-04 MED ORDER — APIXABAN 5 MG PO TABS: 5.0000 mg | ORAL_TABLET | Freq: Two times a day (BID) | ORAL | 3 refills | Status: DC

## 2022-04-04 NOTE — Telephone Encounter (Signed)
[employer] [facility] pt assistance paperwork signed and faxed with confirmation of successful received.   Tried to call pt's daughter, Maudie Mercury. Left message for daughter to call back.

## 2022-04-04 NOTE — Telephone Encounter (Signed)
[employer] pt assistance application completed, signed and faxed. Received confirmation of successful faxed.   Called Daughter, Maudie Mercury to let her know. She would like to know if we have samples that we can provide.   Medication samples have been provided to the patient.  Drug name: Eliquis '5mg'$  Qty: 2 boxes LOT: [id number] Exp.Date: 07/2023  Samples left at front desk for patient pick-up.

## 2022-04-04 NOTE — Telephone Encounter (Signed)
Erroneous encounter

## 2022-04-06 ENCOUNTER — Other Ambulatory Visit: Payer: Self-pay | Admitting: Urology

## 2022-04-06 ENCOUNTER — Other Ambulatory Visit (HOSPITAL_COMMUNITY): Payer: Self-pay

## 2022-04-06 DIAGNOSIS — R1084 Generalized abdominal pain: Secondary | ICD-10-CM | POA: Diagnosis not present

## 2022-04-06 DIAGNOSIS — R8271 Bacteriuria: Secondary | ICD-10-CM | POA: Diagnosis not present

## 2022-04-06 DIAGNOSIS — N200 Calculus of kidney: Secondary | ICD-10-CM | POA: Diagnosis not present

## 2022-04-06 DIAGNOSIS — R109 Unspecified abdominal pain: Secondary | ICD-10-CM | POA: Diagnosis not present

## 2022-04-06 DIAGNOSIS — R319 Hematuria, unspecified: Secondary | ICD-10-CM | POA: Diagnosis not present

## 2022-04-06 MED ORDER — CEPHALEXIN 500 MG PO CAPS
ORAL_CAPSULE | ORAL | 0 refills | Status: DC
  Filled 2022-04-06: qty 14, 7d supply, fill #0

## 2022-04-06 MED ORDER — HYDROCODONE-ACETAMINOPHEN 5-325 MG PO TABS: ORAL_TABLET | ORAL | 0 refills | Status: DC

## 2022-04-06 NOTE — H&P (View-Only) (Signed)
Office Visit Report     04/06/2022   --------------------------------------------------------------------------------   Richard Reed  MRN: [id number]  DOB: 02-Jan-1926, 88 year old Male  SSN: [id number]   PRIMARY CARE:  Dineen Kid, MD  PRIMARY CARE FAX:  [phone]  REFERRING:  Georgette Dover, MD  PROVIDER:  [name], M.D.  LOCATION:  [facility] [phone]     --------------------------------------------------------------------------------   CC/HPI: F/u -   1) kidney stone - he passed a 4 mm stone on right in 2012 and underwent ESWL Aug 2018 for enlarging 14 mm (on prior CT 11 cm SSD, 526 HU, visible) left UPJ stone. F/u KUB with two fragments comfirmed in LLP with CT June 28, 2017. Fragement dropped into the LLP. No hydro. Scarring in lung base likely -- CT chest Sep 2019 stable.   F/u KUB with stable LLP 7 mm and 5 mm stones 03/20. An Aug 2020 scan showed the stones more in the upper left kidney and some mild hydro. 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 KUB 09/22, with stable 6 mm and 4 mm left renal stones. He noted "a little flank" pain a few weeks ago and it didn't last.   He had a KUB in March 2023 which scattered bilateral 5 to 6 mm stones. No obvious ureteral stone.    2) H/o PCa - XRT in 2000. PSA has been low. PSA was 0.01 03/22.    3) BPH, luts - retention p URS in 2012. AUASS = 4. On tamsulosin QD. Treated for UTI in March 2023. He is on tamsulosin but does not feel like he is quite emptying. Postvoid 254 mL. He has MH and poss UTI. He's had more urgency and straining. His Mar 2023 PSA was "0.0" on KPN with Cr 1.36.   We started finasteride Aug 2023 given the UTI, urgency and straining to void. His urine cx was + for e coli. He took cephalexin. CT was benign Aug 2023 with bilateral renal stones, no hydro, small bladder diverticula and brachy seeds. Since then he's had urgency to void and hesitancy. Aug 2023  cystoscopy with non-obstructing prostate, Bladder: A diverticulum x2 inspected. No tumors. Normal mucosa. No stones. His PVR today is 142 ml.    Today, seen for the above. He returns for right flank pain that started yesterday. He hasn't seen a stone pass. He has some urgency and hesitancy. PVR stable at 128 ml. He had a CT in Aug 2023 which showed bilateral stones up to 9 mm. His urine looks infected. No dysuria but he has had some right testicle pain. Prior Cx e coli. His KUB today with an opacity of 4 mm in region of right UPJ and one or two stones (total 13 mm) in region of the left UPJ. He asked for something for the pain. He was given toradol and rocephin. CT with mobile stone in right renal pelvis 5 mm with hydro but no stone in the UPJ or ureter (ball valving?) and the stone on the left has moved and is at the left UPJ at 11 mm but there is no hydro.   He is here with his daughter [name] - her # is cell above. Cardiology has told pt and daughter they aren't going to do any more procedures.     ALLERGIES: No Allergies    MEDICATIONS: Crestor  Finasteride 5 mg tablet 1 tablet PO Daily  Metoprolol Succinate  Tamsulosin Hcl  0.4 mg capsule 1 capsule PO Q HS  Eliquis 5 mg tablet  Ensure  Ferrous Sulfate  Lasix  Multivitamin  Preservision Areds  Systane  Vitamin B Complex     GU PSH: Cystoscopy - 12/13/2021 Cystoscopy Insert Stent - 2012 Cystoscopy Ureteroscopy - 2012 ESWL - 2018       PSH Notes: Cholecystectomy, Cystoscopy With Ureteroscopy Right, Cystoscopy With Insertion Of Ureteral Stent Bilateral   NON-GU PSH: Cholecystectomy (open) - 2013     GU PMH: BPH w/LUTS - 02/24/2022, no significant cysto findings. Cont water intake and abx. , - 12/13/2021, disc nature r/b/a to adding 5ari and we'll start given that he's had some UTI. Disc mightnot work p brachy but better than inc alpha as far as side effects , - 11/24/2021, cont tams, - 01/06/2021, - 2021 Incomplete bladder emptying,  doing well - cont meds - 02/24/2022, urine for cx , - 11/24/2021, - 01/06/2021, - 2021 Dysuria - 12/13/2021 Microscopic hematuria - 12/02/2021, check scan to make sure no stone passage etc They want to do the scan. , - 11/24/2021 Acute Cystitis/UTI - 07/02/2021, - 06/25/2021 History of prostate cancer, psa remains low - 01/06/2021, - 2021 (Stable), - 2020, Personal history of prostate cancer, - 2017 Renal calculus, His pain resolved and KUB stable. Disc further work-up and we (pt, daughter) decided to continue surveillance. - 01/06/2021, - 2021, - 2021, - 2020, - 2019, - 2019, - 2018, - 2018, - 2018, Nephrolithiasis, - 2017 Weak Urinary Stream (Stable) - 2020, - 2019 Prostate Cancer - 2019, - 2018, Prostate cancer, - 2016 Urinary Retention, Unspec, Incomplete bladder emptying - 2016 Bladder-neck stenosis/contracture, Bladder neck contracture - 2014 Hydronephrosis Unspec, Hydronephrosis, right - 2014 Mixed incontinence, Urge and stress incontinence - 2014 Oth urogenital candidiasis, Genitourinary infection, candidal - 2014 Renal cyst, Renal cyst, acquired - 2014 Urinary Tract Inf, Unspec site, Pyuria - 2014, Urinary tract infection, - 2014      PMH Notes:  2007-02-14 09:08:32 - Note: Arthritis   NON-GU PMH: Solitary pulmonary nodule - 2019 Encounter for general adult medical examination without abnormal findings, Encounter for preventive health examination - 2016 Cardiac murmur, unspecified, Murmurs - 2014 Personal history of other diseases of the digestive system, History of esophageal reflux - 2014 Diverticulitis    FAMILY HISTORY: Death - Mother, Father Family Health Status Children _4__ Living Daughter - Runs In Family Heart Disease - Mother, Brother nephrolithiasis - Brother Prostate Cancer - Brother   SOCIAL HISTORY: Marital Status: Married Preferred Language: English; Ethnicity: Not Hispanic Or Latino; Race: White Current Smoking Status: Patient has never smoked.   Tobacco Use  Assessment Completed: Used Tobacco in last 30 days? Has never drank.  Does not drink caffeine.     Notes: Never A Smoker, Tobacco Use, Occupation:, Marital History - Currently Married, Alcohol Use   REVIEW OF SYSTEMS:    GU Review Male:   Patient denies frequent urination, hard to postpone urination, burning/ pain with urination, get up at night to urinate, leakage of urine, stream starts and stops, trouble starting your stream, have to strain to urinate , erection problems, and penile pain.  Gastrointestinal (Upper):   Patient denies nausea, vomiting, and indigestion/ heartburn.  Gastrointestinal (Lower):   Patient denies diarrhea and constipation.  Constitutional:   Patient denies fever, night sweats, weight loss, and fatigue.  Skin:   Patient denies skin rash/ lesion and itching.  Eyes:   Patient denies blurred vision and double vision.  Ears/ Nose/ Throat:  Patient denies sore throat and sinus problems.  Hematologic/Lymphatic:   Patient denies swollen glands and easy bruising.  Cardiovascular:   Patient denies leg swelling and chest pains.  Respiratory:   Patient denies cough and shortness of breath.  Endocrine:   Patient denies excessive thirst.  Musculoskeletal:   Patient denies back pain and joint pain.  Neurological:   Patient denies headaches and dizziness.  Psychologic:   Patient denies depression and anxiety.   VITAL SIGNS:      04/06/2022 02:07 PM  BP 158/89 mmHg  Pulse 75 /min  Temperature 97.7 F / 36.5 C   GU PHYSICAL EXAMINATION:    Scrotum: No lesions. No edema. No cysts. No warts.  Epididymides: Right: no spermatocele, no masses, no cysts, no tenderness, no induration, no enlargement. Left: no spermatocele, no masses, no cysts, no tenderness, no induration, no enlargement.  Testes: No tenderness, no swelling, no enlargement left testes. No tenderness, no swelling, no enlargement right testes. Normal location left testes. Normal location right testes. No mass, no cyst,  no varicocele, no hydrocele left testes. No mass, no cyst, no varicocele, no hydrocele right testes.  Urethral Meatus: Normal size. No lesion, no wart, no discharge, no polyp. Normal location.  Penis: Circumcised, no warts, no cracks. No dorsal Peyronie's plaques, no left corporal Peyronie's plaques, no right corporal Peyronie's plaques, no scarring, no warts. No balanitis, no meatal stenosis.   MULTI-SYSTEM PHYSICAL EXAMINATION:    Constitutional: Well-nourished. No physical deformities. Normally developed. Good grooming.  Neck: Neck symmetrical, not swollen. Normal tracheal position.  Respiratory: No labored breathing, no use of accessory muscles.   Cardiovascular: Normal temperature, normal extremity pulses, no swelling, no varicosities.  Skin: No paleness, no jaundice, no cyanosis. No lesion, no ulcer, no rash.  Neurologic / Psychiatric: Oriented to time, oriented to place, oriented to person. No depression, no anxiety, no agitation.  Gastrointestinal: No mass, no tenderness, no rigidity, non obese abdomen.     Complexity of Data:  Records Review:   AUA Symptom Score  X-Ray Review: KUB: Reviewed Films. Discussed With Patient. 2023 C.T. Abdomen/Pelvis: Reviewed Films. Discussed With Patient. aug 2023     11/24/16 08/21/15 07/09/13 04/28/11 12/16/10 04/21/10 04/20/09 03/17/08  PSA  Total PSA <0.015 ng/dL <0.01  0.01  <0.01  0.01  0.00  <0.04  <0.04     PROCEDURES:         C.T. ABD-Pelv w/o - 74176      Patient confirmed No Neulasta OnPro Device.          KUB - [id number]  A single view of the abdomen is obtained.  Calculi:  opacity of 4 mm in region of right UPJ and one or two stones (total 13 mm) in region of the left UPJ      The bones appeared normal. The bowel gas pattern appeared normal. The soft tissues were unremarkable.  Patient confirmed No Neulasta OnPro Device.          PVR Ultrasound - 38937  Scanned Volume: 129 cc         Urinalysis w/Scope - 81001 Dipstick  Dipstick Cont'd Micro  Color: Yellow Bilirubin: Neg mg/dL WBC/hpf: 20 - 40/hpf  Appearance: Slightly Cloudy Ketones: Neg mg/dL RBC/hpf: 3 - 10/hpf  Specific Gravity: 1.010 Blood: 1+ ery/uL Bacteria: Mod (26-50/hpf)  pH: <=5.0 Protein: Neg mg/dL Cystals: NS (Not Seen)  Glucose: Neg mg/dL Urobilinogen: 0.2 mg/dL Casts: NS (Not Seen)    Nitrites: Positive Trichomonas: Not Present  Leukocyte Esterase: 3+ leu/uL Mucous: Not Present      Epithelial Cells: 0 - 5/hpf      Yeast: NS (Not Seen)      Sperm: Not Present         Ceftriaxone 1g - 35361, W4315 0 med wasted. BS   Qty: 1 Adm. By: Robynn Pane  Unit: gram Lot No [id number]  Route: IM Exp. Date 03/18/2024  Freq: None Mfgr.:   Site: Left Buttock         Ketoralac '60mg'$  - J1885A, [id number] 30 mg wasted. BS   Qty: 30 Adm. By: Robynn Pane  Unit: mg Lot No [id number]  Route: IM Exp. Date 05/20/2023  Freq: None Mfgr.:   Site: Right Buttock   ASSESSMENT:      ICD-10 Details  1 GU:   Renal calculus - N20.0 Chronic, Stable  2   Flank Pain - R10.84 Undiagnosed New Problem - he was given toradol 30 mg IM x 1. He felt much better. I sent in pain medicine and antibiotics but had to send it through the epic system because my 2 factor identification is not working here in the office. We discussed the stone on the right is moving and there is hydronephrosis although no obvious stone right now at the right UPJ and ureter but also the stone on the left does move and it is the left UPJ. Given the bilateral stone movement and his urine is at high risk for obstruction and complications. We went over the nature risk and benefits of sending a urine culture and proceeding with cystoscopy and bilateral stents tomorrow. We can then do a staged bilateral ureteroscopy once culture is back. All questions answered. They like to proceed.  3 NON-GU:   Bacteriuria - R82.71 Chronic, Stable - He was given rocephin 1 g IM   PLAN:           Orders Labs Urine Culture   X-Rays: C.T. Abdomen/Pelvis Without I.V. Contrast    KUB  X-Ray Notes: . History:  Hematuria: Yes/No  Patient to see MD after exam: Yes/No  Previous exam: CT / IVP/ US/ KUB/ None  When:  Where:  Diabetic: Yes/ No  BUN/ Creatinine:  Date of last BUN Creatinine:  Weight in pounds:  Allergy- IV Contrast: Yes/ No  Conflicting diabetic meds: Yes/ No  Diabetic Meds:  Prior Authorization #Claudie Revering [id number]           Schedule Return Visit/Planned Activity: ASAP - Schedule Surgery          Document Letter(s):  Created for Patient: Clinical Summary         Next Appointment:      Next Appointment: 04/07/2022 01:15 PM    Appointment Type: Surgery     Location: [facility] [phone]    Provider: [name], M.D.    Reason for Visit: OP WL CYSTO BIL STENT      * Signed by [name], M.D. on 04/06/22 at 4:56 PM (EST)*      The information contained in this medical record document is considered private and confidential patient information. This information can only be used for the medical diagnosis and/or medical services that are being provided by the patient's selected caregivers. This information can only be distributed outside of the patient's care if the patient agrees and signs waivers of authorization for this information to be sent to an outside source or route.

## 2022-04-06 NOTE — Progress Notes (Signed)
Preop phone call completed by conversation with daughter [name], who verbalizes understanding of 1100 arrival.  She states Pt is hard of hearing but alert and oriented and should be able to sign consent form. He reads lips.

## 2022-04-06 NOTE — H&P (Signed)
Office Visit Report     04/06/2022   --------------------------------------------------------------------------------   Richard Reed  MRN: [id number]  DOB: October 24, 1925, 88 year old Male  SSN: [id number]   PRIMARY CARE:  Dineen Kid, MD  PRIMARY CARE FAX:  [phone]  REFERRING:  Georgette Dover, MD  PROVIDER:  [name], M.D.  LOCATION:  [facility] [phone]     --------------------------------------------------------------------------------   CC/HPI: F/u -   1) kidney stone - he passed a 4 mm stone on right in 2012 and underwent ESWL Aug 2018 for enlarging 14 mm (on prior CT 11 cm SSD, 526 HU, visible) left UPJ stone. F/u KUB with two fragments comfirmed in LLP with CT June 28, 2017. Fragement dropped into the LLP. No hydro. Scarring in lung base likely -- CT chest Sep 2019 stable.   F/u KUB with stable LLP 7 mm and 5 mm stones 03/20. An Aug 2020 scan showed the stones more in the upper left kidney and some mild hydro. 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 KUB 09/22, with stable 6 mm and 4 mm left renal stones. He noted "a little flank" pain a few weeks ago and it didn't last.   He had a KUB in March 2023 which scattered bilateral 5 to 6 mm stones. No obvious ureteral stone.    2) H/o PCa - XRT in 2000. PSA has been low. PSA was 0.01 03/22.    3) BPH, luts - retention p URS in 2012. AUASS = 4. On tamsulosin QD. Treated for UTI in March 2023. He is on tamsulosin but does not feel like he is quite emptying. Postvoid 254 mL. He has MH and poss UTI. He's had more urgency and straining. His Mar 2023 PSA was "0.0" on KPN with Cr 1.36.   We started finasteride Aug 2023 given the UTI, urgency and straining to void. His urine cx was + for e coli. He took cephalexin. CT was benign Aug 2023 with bilateral renal stones, no hydro, small bladder diverticula and brachy seeds. Since then he's had urgency to void and hesitancy. Aug 2023  cystoscopy with non-obstructing prostate, Bladder: A diverticulum x2 inspected. No tumors. Normal mucosa. No stones. His PVR today is 142 ml.    Today, seen for the above. He returns for right flank pain that started yesterday. He hasn't seen a stone pass. He has some urgency and hesitancy. PVR stable at 128 ml. He had a CT in Aug 2023 which showed bilateral stones up to 9 mm. His urine looks infected. No dysuria but he has had some right testicle pain. Prior Cx e coli. His KUB today with an opacity of 4 mm in region of right UPJ and one or two stones (total 13 mm) in region of the left UPJ. He asked for something for the pain. He was given toradol and rocephin. CT with mobile stone in right renal pelvis 5 mm with hydro but no stone in the UPJ or ureter (ball valving?) and the stone on the left has moved and is at the left UPJ at 11 mm but there is no hydro.   He is here with his daughter [name] - her # is cell above. Cardiology has told pt and daughter they aren't going to do any more procedures.     ALLERGIES: No Allergies    MEDICATIONS: Crestor  Finasteride 5 mg tablet 1 tablet PO Daily  Metoprolol Succinate  Tamsulosin Hcl  0.4 mg capsule 1 capsule PO Q HS  Eliquis 5 mg tablet  Ensure  Ferrous Sulfate  Lasix  Multivitamin  Preservision Areds  Systane  Vitamin B Complex     GU PSH: Cystoscopy - 12/13/2021 Cystoscopy Insert Stent - 2012 Cystoscopy Ureteroscopy - 2012 ESWL - 2018       PSH Notes: Cholecystectomy, Cystoscopy With Ureteroscopy Right, Cystoscopy With Insertion Of Ureteral Stent Bilateral   NON-GU PSH: Cholecystectomy (open) - 2013     GU PMH: BPH w/LUTS - 02/24/2022, no significant cysto findings. Cont water intake and abx. , - 12/13/2021, disc nature r/b/a to adding 5ari and we'll start given that he's had some UTI. Disc mightnot work p brachy but better than inc alpha as far as side effects , - 11/24/2021, cont tams, - 01/06/2021, - 2021 Incomplete bladder emptying,  doing well - cont meds - 02/24/2022, urine for cx , - 11/24/2021, - 01/06/2021, - 2021 Dysuria - 12/13/2021 Microscopic hematuria - 12/02/2021, check scan to make sure no stone passage etc They want to do the scan. , - 11/24/2021 Acute Cystitis/UTI - 07/02/2021, - 06/25/2021 History of prostate cancer, psa remains low - 01/06/2021, - 2021 (Stable), - 2020, Personal history of prostate cancer, - 2017 Renal calculus, His pain resolved and KUB stable. Disc further work-up and we (pt, daughter) decided to continue surveillance. - 01/06/2021, - 2021, - 2021, - 2020, - 2019, - 2019, - 2018, - 2018, - 2018, Nephrolithiasis, - 2017 Weak Urinary Stream (Stable) - 2020, - 2019 Prostate Cancer - 2019, - 2018, Prostate cancer, - 2016 Urinary Retention, Unspec, Incomplete bladder emptying - 2016 Bladder-neck stenosis/contracture, Bladder neck contracture - 2014 Hydronephrosis Unspec, Hydronephrosis, right - 2014 Mixed incontinence, Urge and stress incontinence - 2014 Oth urogenital candidiasis, Genitourinary infection, candidal - 2014 Renal cyst, Renal cyst, acquired - 2014 Urinary Tract Inf, Unspec site, Pyuria - 2014, Urinary tract infection, - 2014      PMH Notes:  2007-02-14 09:08:32 - Note: Arthritis   NON-GU PMH: Solitary pulmonary nodule - 2019 Encounter for general adult medical examination without abnormal findings, Encounter for preventive health examination - 2016 Cardiac murmur, unspecified, Murmurs - 2014 Personal history of other diseases of the digestive system, History of esophageal reflux - 2014 Diverticulitis    FAMILY HISTORY: Death - Mother, Father Family Health Status Children _4__ Living Daughter - Runs In Family Heart Disease - Mother, Brother nephrolithiasis - Brother Prostate Cancer - Brother   SOCIAL HISTORY: Marital Status: Married Preferred Language: English; Ethnicity: Not Hispanic Or Latino; Race: White Current Smoking Status: Patient has never smoked.   Tobacco Use  Assessment Completed: Used Tobacco in last 30 days? Has never drank.  Does not drink caffeine.     Notes: Never A Smoker, Tobacco Use, Occupation:, Marital History - Currently Married, Alcohol Use   REVIEW OF SYSTEMS:    GU Review Male:   Patient denies frequent urination, hard to postpone urination, burning/ pain with urination, get up at night to urinate, leakage of urine, stream starts and stops, trouble starting your stream, have to strain to urinate , erection problems, and penile pain.  Gastrointestinal (Upper):   Patient denies nausea, vomiting, and indigestion/ heartburn.  Gastrointestinal (Lower):   Patient denies diarrhea and constipation.  Constitutional:   Patient denies fever, night sweats, weight loss, and fatigue.  Skin:   Patient denies skin rash/ lesion and itching.  Eyes:   Patient denies blurred vision and double vision.  Ears/ Nose/ Throat:  Patient denies sore throat and sinus problems.  Hematologic/Lymphatic:   Patient denies swollen glands and easy bruising.  Cardiovascular:   Patient denies leg swelling and chest pains.  Respiratory:   Patient denies cough and shortness of breath.  Endocrine:   Patient denies excessive thirst.  Musculoskeletal:   Patient denies back pain and joint pain.  Neurological:   Patient denies headaches and dizziness.  Psychologic:   Patient denies depression and anxiety.   VITAL SIGNS:      04/06/2022 02:07 PM  BP 158/89 mmHg  Pulse 75 /min  Temperature 97.7 F / 36.5 C   GU PHYSICAL EXAMINATION:    Scrotum: No lesions. No edema. No cysts. No warts.  Epididymides: Right: no spermatocele, no masses, no cysts, no tenderness, no induration, no enlargement. Left: no spermatocele, no masses, no cysts, no tenderness, no induration, no enlargement.  Testes: No tenderness, no swelling, no enlargement left testes. No tenderness, no swelling, no enlargement right testes. Normal location left testes. Normal location right testes. No mass, no cyst,  no varicocele, no hydrocele left testes. No mass, no cyst, no varicocele, no hydrocele right testes.  Urethral Meatus: Normal size. No lesion, no wart, no discharge, no polyp. Normal location.  Penis: Circumcised, no warts, no cracks. No dorsal Peyronie's plaques, no left corporal Peyronie's plaques, no right corporal Peyronie's plaques, no scarring, no warts. No balanitis, no meatal stenosis.   MULTI-SYSTEM PHYSICAL EXAMINATION:    Constitutional: Well-nourished. No physical deformities. Normally developed. Good grooming.  Neck: Neck symmetrical, not swollen. Normal tracheal position.  Respiratory: No labored breathing, no use of accessory muscles.   Cardiovascular: Normal temperature, normal extremity pulses, no swelling, no varicosities.  Skin: No paleness, no jaundice, no cyanosis. No lesion, no ulcer, no rash.  Neurologic / Psychiatric: Oriented to time, oriented to place, oriented to person. No depression, no anxiety, no agitation.  Gastrointestinal: No mass, no tenderness, no rigidity, non obese abdomen.     Complexity of Data:  Records Review:   AUA Symptom Score  X-Ray Review: KUB: Reviewed Films. Discussed With Patient. 2023 C.T. Abdomen/Pelvis: Reviewed Films. Discussed With Patient. aug 2023     11/24/16 08/21/15 07/09/13 04/28/11 12/16/10 04/21/10 04/20/09 03/17/08  PSA  Total PSA <0.015 ng/dL <0.01  0.01  <0.01  0.01  0.00  <0.04  <0.04     PROCEDURES:         C.T. ABD-Pelv w/o - 74176      Patient confirmed No Neulasta OnPro Device.          KUB - [id number]  A single view of the abdomen is obtained.  Calculi:  opacity of 4 mm in region of right UPJ and one or two stones (total 13 mm) in region of the left UPJ      The bones appeared normal. The bowel gas pattern appeared normal. The soft tissues were unremarkable.  Patient confirmed No Neulasta OnPro Device.          PVR Ultrasound - 36644  Scanned Volume: 129 cc         Urinalysis w/Scope - 81001 Dipstick  Dipstick Cont'd Micro  Color: Yellow Bilirubin: Neg mg/dL WBC/hpf: 20 - 40/hpf  Appearance: Slightly Cloudy Ketones: Neg mg/dL RBC/hpf: 3 - 10/hpf  Specific Gravity: 1.010 Blood: 1+ ery/uL Bacteria: Mod (26-50/hpf)  pH: <=5.0 Protein: Neg mg/dL Cystals: NS (Not Seen)  Glucose: Neg mg/dL Urobilinogen: 0.2 mg/dL Casts: NS (Not Seen)    Nitrites: Positive Trichomonas: Not Present  Leukocyte Esterase: 3+ leu/uL Mucous: Not Present      Epithelial Cells: 0 - 5/hpf      Yeast: NS (Not Seen)      Sperm: Not Present         Ceftriaxone 1g - 95621, H0865 0 med wasted. BS   Qty: 1 Adm. By: Robynn Pane  Unit: gram Lot No [id number]  Route: IM Exp. Date 03/18/2024  Freq: None Mfgr.:   Site: Left Buttock         Ketoralac '60mg'$  - J1885A, [id number] 30 mg wasted. BS   Qty: 30 Adm. By: Robynn Pane  Unit: mg Lot No [id number]  Route: IM Exp. Date 05/20/2023  Freq: None Mfgr.:   Site: Right Buttock   ASSESSMENT:      ICD-10 Details  1 GU:   Renal calculus - N20.0 Chronic, Stable  2   Flank Pain - R10.84 Undiagnosed New Problem - he was given toradol 30 mg IM x 1. He felt much better. I sent in pain medicine and antibiotics but had to send it through the epic system because my 2 factor identification is not working here in the office. We discussed the stone on the right is moving and there is hydronephrosis although no obvious stone right now at the right UPJ and ureter but also the stone on the left does move and it is the left UPJ. Given the bilateral stone movement and his urine is at high risk for obstruction and complications. We went over the nature risk and benefits of sending a urine culture and proceeding with cystoscopy and bilateral stents tomorrow. We can then do a staged bilateral ureteroscopy once culture is back. All questions answered. They like to proceed.  3 NON-GU:   Bacteriuria - R82.71 Chronic, Stable - He was given rocephin 1 g IM   PLAN:           Orders Labs Urine Culture   X-Rays: C.T. Abdomen/Pelvis Without I.V. Contrast    KUB  X-Ray Notes: . History:  Hematuria: Yes/No  Patient to see MD after exam: Yes/No  Previous exam: CT / IVP/ US/ KUB/ None  When:  Where:  Diabetic: Yes/ No  BUN/ Creatinine:  Date of last BUN Creatinine:  Weight in pounds:  Allergy- IV Contrast: Yes/ No  Conflicting diabetic meds: Yes/ No  Diabetic Meds:  Prior Authorization #Claudie Revering [id number]           Schedule Return Visit/Planned Activity: ASAP - Schedule Surgery          Document Letter(s):  Created for Patient: Clinical Summary         Next Appointment:      Next Appointment: 04/07/2022 01:15 PM    Appointment Type: Surgery     Location: [facility] [phone] [id number]    Provider: [name], M.D.    Reason for Visit: OP WL CYSTO BIL STENT      * Signed by [name], M.D. on 04/06/22 at 4:56 PM (EST)*      The information contained in this medical record document is considered private and confidential patient information. This information can only be used for the medical diagnosis and/or medical services that are being provided by the patient's selected caregivers. This information can only be distributed outside of the patient's care if the patient agrees and signs waivers of authorization for this information to be sent to an outside source or route.

## 2022-04-07 ENCOUNTER — Ambulatory Visit (HOSPITAL_COMMUNITY): Payer: Medicare Other

## 2022-04-07 ENCOUNTER — Other Ambulatory Visit (HOSPITAL_COMMUNITY): Payer: Self-pay

## 2022-04-07 ENCOUNTER — Ambulatory Visit (HOSPITAL_BASED_OUTPATIENT_CLINIC_OR_DEPARTMENT_OTHER): Payer: Medicare Other | Admitting: Anesthesiology

## 2022-04-07 ENCOUNTER — Encounter (HOSPITAL_COMMUNITY)
Admission: RE | Disposition: A | Discharge status: Home / Self Care | Payer: Self-pay | Source: Home / Self Care | Attending: Urology

## 2022-04-07 ENCOUNTER — Ambulatory Visit (HOSPITAL_COMMUNITY): Payer: Medicare Other | Admitting: Anesthesiology

## 2022-04-07 ENCOUNTER — Encounter (HOSPITAL_COMMUNITY): Payer: Self-pay | Admitting: Urology

## 2022-04-07 ENCOUNTER — Ambulatory Visit (HOSPITAL_COMMUNITY)
Admission: RE | Admit: 2022-04-07 | Discharge: 2022-04-07 | Disposition: A | Discharge status: Home / Self Care | Payer: Medicare Other | Attending: Urology | Admitting: Urology

## 2022-04-07 DIAGNOSIS — N401 Enlarged prostate with lower urinary tract symptoms: Secondary | ICD-10-CM | POA: Diagnosis not present

## 2022-04-07 DIAGNOSIS — N289 Disorder of kidney and ureter, unspecified: Secondary | ICD-10-CM | POA: Insufficient documentation

## 2022-04-07 DIAGNOSIS — N202 Calculus of kidney with calculus of ureter: Secondary | ICD-10-CM | POA: Diagnosis not present

## 2022-04-07 DIAGNOSIS — I340 Nonrheumatic mitral (valve) insufficiency: Secondary | ICD-10-CM | POA: Diagnosis not present

## 2022-04-07 DIAGNOSIS — I739 Peripheral vascular disease, unspecified: Secondary | ICD-10-CM | POA: Insufficient documentation

## 2022-04-07 DIAGNOSIS — Z8673 Personal history of transient ischemic attack (TIA), and cerebral infarction without residual deficits: Secondary | ICD-10-CM | POA: Diagnosis not present

## 2022-04-07 DIAGNOSIS — R109 Unspecified abdominal pain: Secondary | ICD-10-CM | POA: Diagnosis not present

## 2022-04-07 DIAGNOSIS — I509 Heart failure, unspecified: Secondary | ICD-10-CM

## 2022-04-07 DIAGNOSIS — I110 Hypertensive heart disease with heart failure: Secondary | ICD-10-CM

## 2022-04-07 DIAGNOSIS — Z87440 Personal history of urinary (tract) infections: Secondary | ICD-10-CM | POA: Insufficient documentation

## 2022-04-07 DIAGNOSIS — R339 Retention of urine, unspecified: Secondary | ICD-10-CM | POA: Diagnosis not present

## 2022-04-07 DIAGNOSIS — N132 Hydronephrosis with renal and ureteral calculous obstruction: Secondary | ICD-10-CM | POA: Insufficient documentation

## 2022-04-07 DIAGNOSIS — N130 Hydronephrosis with ureteropelvic junction obstruction: Secondary | ICD-10-CM | POA: Diagnosis not present

## 2022-04-07 DIAGNOSIS — N200 Calculus of kidney: Secondary | ICD-10-CM | POA: Diagnosis not present

## 2022-04-07 DIAGNOSIS — Z87442 Personal history of urinary calculi: Secondary | ICD-10-CM | POA: Insufficient documentation

## 2022-04-07 HISTORY — PX: CYSTOSCOPY WITH STENT PLACEMENT: SHX5790

## 2022-04-07 LAB — POCT I-STAT, CHEM 8
BUN: 33 mg/dL — ABNORMAL HIGH (ref 8–23)
Calcium, Ion: 1.19 mmol/L (ref 1.15–1.40)
Chloride: 99 mmol/L (ref 98–111)
Creatinine, Ser: 2.5 mg/dL — ABNORMAL HIGH (ref 0.61–1.24)
Glucose, Bld: 114 mg/dL — ABNORMAL HIGH (ref 70–99)
HCT: 39 % (ref 39.0–52.0)
Hemoglobin: 13.3 g/dL (ref 13.0–17.0)
Potassium: 5.2 mmol/L — ABNORMAL HIGH (ref 3.5–5.1)
Sodium: 135 mmol/L (ref 135–145)
TCO2: 24 mmol/L (ref 22–32)

## 2022-04-07 SURGERY — CYSTOSCOPY, WITH STENT INSERTION
Anesthesia: General | Laterality: Bilateral

## 2022-04-07 MED ORDER — SODIUM CHLORIDE 0.9 % IR SOLN
Status: DC | PRN
  Administered 2022-04-07: 3000 mL

## 2022-04-07 MED ORDER — LIDOCAINE 2% (20 MG/ML) 5 ML SYRINGE
INTRAMUSCULAR | Status: DC | PRN
  Administered 2022-04-07: 100 mg via INTRAVENOUS

## 2022-04-07 MED ORDER — OXYCODONE HCL 5 MG PO TABS: 5.0000 mg | ORAL_TABLET | Freq: Once | ORAL | Status: DC | PRN

## 2022-04-07 MED ORDER — HYDROCODONE-ACETAMINOPHEN 5-325 MG PO TABS: 1.0000 | ORAL_TABLET | ORAL | 0 refills | Status: DC | PRN

## 2022-04-07 MED ORDER — DEXAMETHASONE SODIUM PHOSPHATE 10 MG/ML IJ SOLN
INTRAMUSCULAR | Status: DC | PRN
  Administered 2022-04-07: 5 mg via INTRAVENOUS

## 2022-04-07 MED ORDER — FENTANYL CITRATE PF 50 MCG/ML IJ SOSY
50.0000 ug | PREFILLED_SYRINGE | Freq: Once | INTRAMUSCULAR | Status: AC
  Administered 2022-04-07: 50 ug via INTRAVENOUS

## 2022-04-07 MED ORDER — LIDOCAINE HCL (PF) 2 % IJ SOLN
INTRAMUSCULAR | Status: AC
  Filled 2022-04-07: qty 5

## 2022-04-07 MED ORDER — PHENYLEPHRINE 80 MCG/ML (10ML) SYRINGE FOR IV PUSH (FOR BLOOD PRESSURE SUPPORT)
PREFILLED_SYRINGE | INTRAVENOUS | Status: DC | PRN
  Administered 2022-04-07 (×2): 160 ug via INTRAVENOUS
  Administered 2022-04-07: 80 ug via INTRAVENOUS
  Administered 2022-04-07 (×3): 160 ug via INTRAVENOUS

## 2022-04-07 MED ORDER — IOHEXOL 300 MG/ML  SOLN
INTRAMUSCULAR | Status: DC | PRN
  Administered 2022-04-07: 18 mL

## 2022-04-07 MED ORDER — FENTANYL CITRATE (PF) 100 MCG/2ML IJ SOLN
INTRAMUSCULAR | Status: DC | PRN
  Administered 2022-04-07: 50 ug via INTRAVENOUS

## 2022-04-07 MED ORDER — ONDANSETRON HCL 4 MG/2ML IJ SOLN
INTRAMUSCULAR | Status: AC
  Filled 2022-04-07: qty 2

## 2022-04-07 MED ORDER — OXYCODONE HCL 5 MG/5ML PO SOLN: 5.0000 mg | Freq: Once | ORAL | Status: DC | PRN

## 2022-04-07 MED ORDER — PROPOFOL 10 MG/ML IV BOLUS
INTRAVENOUS | Status: AC
  Filled 2022-04-07: qty 20

## 2022-04-07 MED ORDER — LACTATED RINGERS IV SOLN: INTRAVENOUS | Status: DC

## 2022-04-07 MED ORDER — DEXAMETHASONE SODIUM PHOSPHATE 10 MG/ML IJ SOLN
INTRAMUSCULAR | Status: AC
  Filled 2022-04-07: qty 1

## 2022-04-07 MED ORDER — ONDANSETRON HCL 4 MG/2ML IJ SOLN
INTRAMUSCULAR | Status: DC | PRN
  Administered 2022-04-07: 4 mg via INTRAVENOUS

## 2022-04-07 MED ORDER — ONDANSETRON HCL 4 MG/2ML IJ SOLN: 4.0000 mg | Freq: Once | INTRAMUSCULAR | Status: DC | PRN

## 2022-04-07 MED ORDER — HYDROCODONE-ACETAMINOPHEN 5-325 MG PO TABS
1.0000 | ORAL_TABLET | ORAL | 0 refills | Status: DC | PRN
  Filled 2022-04-07: qty 12, 2d supply, fill #0

## 2022-04-07 MED ORDER — FENTANYL CITRATE PF 50 MCG/ML IJ SOSY: 25.0000 ug | PREFILLED_SYRINGE | INTRAMUSCULAR | Status: DC | PRN

## 2022-04-07 MED ORDER — PROPOFOL 10 MG/ML IV BOLUS
INTRAVENOUS | Status: DC | PRN
  Administered 2022-04-07: 100 mg via INTRAVENOUS

## 2022-04-07 MED ORDER — CHLORHEXIDINE GLUCONATE 0.12 % MT SOLN
15.0000 mL | Freq: Once | OROMUCOSAL | Status: AC
  Administered 2022-04-07: 15 mL via OROMUCOSAL

## 2022-04-07 MED ORDER — FENTANYL CITRATE (PF) 100 MCG/2ML IJ SOLN
INTRAMUSCULAR | Status: AC
  Filled 2022-04-07: qty 2

## 2022-04-07 MED ORDER — HYDROCODONE-ACETAMINOPHEN 5-325 MG PO TABS: ORAL_TABLET | ORAL | 0 refills | Status: DC

## 2022-04-07 MED ORDER — FENTANYL CITRATE PF 50 MCG/ML IJ SOSY
PREFILLED_SYRINGE | INTRAMUSCULAR | Status: AC
  Filled 2022-04-07: qty 2

## 2022-04-07 MED ORDER — CEFAZOLIN SODIUM-DEXTROSE 2-4 GM/100ML-% IV SOLN
2.0000 g | Freq: Once | INTRAVENOUS | Status: AC
  Administered 2022-04-07: 2 g via INTRAVENOUS
  Filled 2022-04-07: qty 100

## 2022-04-07 SURGICAL SUPPLY — 19 items
BAG URO CATCHER STRL LF (MISCELLANEOUS) ×1 IMPLANT
BASKET ZERO TIP NITINOL 2.4FR (BASKET) IMPLANT
BSKT STON RTRVL ZERO TP 2.4FR (BASKET)
CATH FOLEY 2WAY SLVR  5CC 16FR (CATHETERS) ×1
CATH FOLEY 2WAY SLVR 5CC 16FR (CATHETERS) IMPLANT
CATH URETL OPEN END 6FR 70 (CATHETERS) IMPLANT
CLOTH BEACON ORANGE TIMEOUT ST (SAFETY) ×1 IMPLANT
GLOVE SURG LX STRL 7.5 STRW (GLOVE) ×1 IMPLANT
GOWN STRL REUS W/ TWL XL LVL3 (GOWN DISPOSABLE) ×1 IMPLANT
GOWN STRL REUS W/TWL XL LVL3 (GOWN DISPOSABLE) ×1
GUIDEWIRE ANG ZIPWIRE 038X150 (WIRE) IMPLANT
GUIDEWIRE STR DUAL SENSOR (WIRE) ×1 IMPLANT
HOLDER FOLEY CATH W/STRAP (MISCELLANEOUS) IMPLANT
KIT TURNOVER KIT A (KITS) IMPLANT
MANIFOLD NEPTUNE II (INSTRUMENTS) ×1 IMPLANT
PACK CYSTO (CUSTOM PROCEDURE TRAY) ×1 IMPLANT
STENT URET 6FRX24 CONTOUR (STENTS) IMPLANT
SYR 10ML LL (SYRINGE) IMPLANT
TUBING CONNECTING 10 (TUBING) ×1 IMPLANT

## 2022-04-07 NOTE — Anesthesia Procedure Notes (Signed)
Procedure Name: LMA Insertion Date/Time: 04/07/2022 12:51 PM  Performed by: Lind Covert, CRNAPre-anesthesia Checklist: Patient identified, Emergency Drugs available, Suction available, Patient being monitored and Timeout performed Patient Re-evaluated:Patient Re-evaluated prior to induction Oxygen Delivery Method: Circle system utilized Preoxygenation: Pre-oxygenation with 100% oxygen Induction Type: IV induction LMA: LMA inserted LMA Size: 4.0 Tube type: Oral Number of attempts: 1 Placement Confirmation: positive ETCO2 and breath sounds checked- equal and bilateral Tube secured with: Tape Dental Injury: Teeth and Oropharynx as per pre-operative assessment

## 2022-04-07 NOTE — Anesthesia Postprocedure Evaluation (Signed)
Anesthesia Post Note  Patient: Richard Reed  Procedure(s) Performed: CYSTOSCOPY WITH BILATERAL STENT PLACEMENT (Bilateral)     Patient location during evaluation: PACU Anesthesia Type: General Level of consciousness: sedated Pain management: pain level controlled Vital Signs Assessment: post-procedure vital signs reviewed and stable Respiratory status: spontaneous breathing and respiratory function stable Cardiovascular status: stable Postop Assessment: no apparent nausea or vomiting Anesthetic complications: no  No notable events documented.  Last Vitals:  Vitals:   04/07/22 1400 04/07/22 1415  BP: (!) 91/57 99/69  Pulse: 77 76  Resp: 15 18  Temp:  36.7 C  SpO2: 93% 94%    Last Pain:  Vitals:   04/07/22 1415  TempSrc:   PainSc: 0-No pain                 Tritia Endo,Ilir DANIEL

## 2022-04-07 NOTE — Anesthesia Preprocedure Evaluation (Addendum)
Anesthesia Evaluation  Patient identified by MRN, date of birth, ID band Patient awake    Reviewed: Allergy & Precautions, H&P , NPO status , Patient's Chart, lab work & pertinent test results  Airway Mallampati: II  TM Distance: >3 FB Neck ROM: Limited    Dental no notable dental hx.    Pulmonary neg pulmonary ROS   Pulmonary exam normal breath sounds clear to auscultation       Cardiovascular hypertension, + Peripheral Vascular Disease and +CHF  + Valvular Problems/Murmurs AI and AS  Rhythm:Regular Rate:Normal + Systolic murmurs Comparison(s): 07/16/19 EF 35-40%. Mild AS 29mHg mean PG, 234mg peak PG.  Compared to prior echo 3/21, LVF appears improved. AS appears moderate  with mean AVG 22513m and AVA 1.23cm2 with peak AV velociy 13m/69mAortic  insusficiency is moderate and  eccentrically directed towards the anterior mitral valve leaflet.   FINDINGS   Left Ventricle: Left ventricular ejection fraction, by estimation, is 40  to 45%. The left ventricle has mildly decreased function. The left  ventricle demonstrates global hypokinesis. The left ventricular internal  cavity size was moderately dilated.  There is mild left ventricular hypertrophy of the basal-septal segment.  Left ventricular diastolic parameters are consistent with Grade I  diastolic dysfunction (impaired relaxation). Elevated left ventricular  end-diastolic pressure.   Right Ventricle: The right ventricular size is normal. No increase in  right ventricular wall thickness. Right ventricular systolic function is  normal. There is normal pulmonary artery systolic pressure. The tricuspid  regurgitant velocity is 2.65 m/s, and   with an assumed right atrial pressure of 3 mmHg, the estimated right  ventricular systolic pressure is 31.141.7g.   Left Atrium: Left atrial size was severely dilated.   Right Atrium: Right atrial size was normal in size.   Pericardium:  There is no evidence of pericardial effusion.   Mitral Valve: The mitral valve is degenerative in appearance. There is  moderate thickening of the mitral valve leaflet(s). There is mild  calcification of the mitral valve leaflet(s). Normal mobility of the  mitral valve leaflets. Moderate mitral annular  calcification. Mild to moderate mitral valve regurgitation. No evidence of  mitral valve stenosis.   Tricuspid Valve: The tricuspid valve is normal in structure. Tricuspid  valve regurgitation is mild . No evidence of tricuspid stenosis.   Aortic Valve: The aortic valve is tricuspid. . There is moderate  thickening and moderate calcification of the aortic valve. Aortic valve  regurgitation is moderate. Aortic regurgitation PHT measures 410 msec.  Moderate aortic stenosis is present. Moderate  aortic valve annular calcification. There is moderate thickening of the  aortic valve. There is moderate calcification of the aortic valve. Aortic  valve mean gradient measures 22.0 mmHg. Aortic valve peak gradient  measures 36.7 mmHg. Aortic valve area, by   VTI measures 1.23 cm.   Pulmonic Valve: The pulmonic valve was normal in structure. Pulmonic valve  regurgitation is not visualized. No evidence of pulmonic stenosis.   Aorta: Aortic dilatation noted. There is mild dilatation of the aortic  root and of the ascending aorta measuring 41 mm.     Neuro/Psych TIACVA, Residual Symptoms  negative psych ROS   GI/Hepatic negative GI ROS, Neg liver ROS,,,  Endo/Other  negative endocrine ROS    Renal/GU Renal InsufficiencyRenal disease  negative genitourinary   Musculoskeletal negative musculoskeletal ROS (+)    Abdominal   Peds negative pediatric ROS (+)  Hematology negative hematology ROS (+)   Anesthesia Other Findings  Reproductive/Obstetrics negative OB ROS                             Anesthesia Physical Anesthesia Plan  ASA: 4  Anesthesia Plan:  General   Post-op Pain Management: Minimal or no pain anticipated   Induction: Intravenous  PONV Risk Score and Plan: 2 and Ondansetron and Treatment may vary due to age or medical condition  Airway Management Planned: LMA  Additional Equipment:   Intra-op Plan:   Post-operative Plan: Extubation in OR  Informed Consent: I have reviewed the patients History and Physical, chart, labs and discussed the procedure including the risks, benefits and alternatives for the proposed anesthesia with the patient or authorized representative who has indicated his/her understanding and acceptance.     Dental advisory given  Plan Discussed with: CRNA and Surgeon  Anesthesia Plan Comments:        Anesthesia Quick Evaluation

## 2022-04-07 NOTE — Op Note (Signed)
Preoperative diagnosis: Bilateral renal pelvic stones, right flank pain Postoperative diagnosis: Same  Procedure: Cystoscopy bilateral retrograde pyelogram bilateral ureteral stent placement  Surgeon: Junious Silk  Anesthesia: General  Indication for procedure: [name] is a 88 year old male with a history of kidney stones, brachytherapy and bladder diverticula.  He presented yesterday with right flank pain.  CT scan revealed stone on the right and moved from a calyx into the right renal pelvis.  There was right hydronephrosis possibly from UPJ obstruction or a ball valving stone.  Similarly on the left to a larger left lower pole stone then moved up into the left renal pelvis although there was no hydro on this side.  Patient also had a dirty urine but he also has incomplete bladder emptying.  They were leaving town for a couple of weeks and were concerned about traveling with possible obstruction.  Stents also will help prepare for bilateral ureteroscopy and prevent any blockage if he were getting infected.  Findings: On exam under anesthesia the penis was uncircumcised without mass or lesion.  Glans and meatus appeared normal.  Scrotum appeared normal.  On cystoscopy the urethra was unremarkable, prostatic urethra was patent and bland consistent with prior brachytherapy.  Urine was cloudy and a larger right and left diverticula were inspected.  There was some mild erythema about the entire bladder but no specific mucosal lesion and no papillary lesion.  No stone or foreign body in the bladder.  Moderate trabeculation.  Left retrograde pyelogram-this outlined a single ureter single collecting system unit with some mild to moderate dilation at the right UPJ of the collecting system and pelvis.  A filling defect in the upper calyx consistent with a mobile stone.  Right retrograde pyelogram-this outlined moderate right hydronephrosis to the UPJ with a filling defect consistent with the stone.  Otherwise the  ureter was unremarkable.  Single ureter single collecting system.  Description of procedure: After consent was obtained patient brought to the operating room.  After adequate anesthesia he was placed lithotomy position and prepped and draped in the usual sterile fashion.  Timeout was performed to confirm the patient and procedure.  Cystoscope was passed per urethra and the bladder irrigated.  Left retrograde pyelogram obtained by cannulating the left ureteral orifice with open-ended catheter injection of contrast.  Wire was advanced to the upper calyx and 6x24 cm stent advanced.  Wire was removed with a good coil seen in the collecting system and a good coil in the bladder.  Identical procedure on the right side to place a 6 x 24 cm right ureteral stent.  This also showed a good coil in the kidney and a good coil in the bladder.  Scope was backed down the Foley catheter was drained to max drain the system for a couple of hours.  Complications: None  Blood loss: Minimal  Specimens: None  Drains: 6 x 24 cm bilateral ureteral stents, 16 French Foley catheter  Disposition: Patient stable to PACU

## 2022-04-07 NOTE — Transfer of Care (Signed)
Immediate Anesthesia Transfer of Care Note  Patient: Richard Reed  Procedure(s) Performed: CYSTOSCOPY WITH BILATERAL STENT PLACEMENT (Bilateral)  Patient Location: PACU  Anesthesia Type:General  Level of Consciousness: sedated  Airway & Oxygen Therapy: Patient Spontanous Breathing and Patient connected to face mask oxygen  Post-op Assessment: Report given to RN and Post -op Vital signs reviewed and stable  Post vital signs: Reviewed and stable  Last Vitals:  Vitals Value Taken Time  BP    Temp    Pulse 76 04/07/22 1332  Resp 14 04/07/22 1332  SpO2 98 % 04/07/22 1332  Vitals shown include unvalidated device data.  Last Pain:  Vitals:   04/07/22 1210  TempSrc:   PainSc: 6       Patients Stated Pain Goal: 4 (21/30/86 5784)  Complications: No notable events documented.

## 2022-04-07 NOTE — Interval H&P Note (Signed)
History and Physical Interval Note:  04/07/2022 12:35 PM  Richard Reed  has presented today for surgery, with the diagnosis of RIGHT RENAL PELVIS STONE LEFT URETERAL PELVIC JUNCTION STONE.  The various methods of treatment have been discussed with the patient and family. After consideration of risks, benefits and other options for treatment, the patient has consented to  Procedure(s) with comments: CYSTOSCOPY WITH BILATERAL STENT PLACEMENT (Bilateral) - 30 MINS [place] CASE as a surgical intervention.  The patient's history has been reviewed, patient examined, no change in status, stable for surgery.  I have reviewed the patient's chart and labs.  Questions were answered to the patient's satisfaction.  He has abd pain and his weight is up a few pounds. Also no BM. Cr 2.5 from 1.5. AFVSS.    [name]

## 2022-04-08 ENCOUNTER — Encounter (HOSPITAL_COMMUNITY): Payer: Self-pay | Admitting: Urology

## 2022-04-12 ENCOUNTER — Other Ambulatory Visit (HOSPITAL_COMMUNITY): Payer: Self-pay

## 2022-04-12 ENCOUNTER — Telehealth: Payer: Self-pay | Admitting: Cardiovascular Disease

## 2022-04-12 ENCOUNTER — Other Ambulatory Visit: Payer: Self-pay | Admitting: Urology

## 2022-04-12 MED ORDER — SULFAMETHOXAZOLE-TRIMETHOPRIM 400-80 MG PO TABS
1.0000 | ORAL_TABLET | Freq: Two times a day (BID) | ORAL | 0 refills | Status: DC
  Filled 2022-04-12: qty 14, 7d supply, fill #0

## 2022-04-12 NOTE — Telephone Encounter (Signed)
     Pre-operative Risk Assessment    Patient Name: Richard Reed  DOB: April 20, 1925 MRN: [id number]      Request for Surgical Clearance    Procedure:   bilateral ureteroscopy  Date of Surgery:  Clearance 05/03/22                                 Surgeon:  [name] Surgeon's Group or Practice Name:  [facility] Phone number:  [phone] Fax number:  [phone]   Type of Clearance Requested:   - Medical  - Pharmacy:  Hold Apixaban (Eliquis) 2 days   Type of Anesthesia:  General    Additional requests/questions:    Signed, Selinda Orion   04/12/2022, 10:45 AM

## 2022-04-13 ENCOUNTER — Other Ambulatory Visit (HOSPITAL_COMMUNITY): Payer: Self-pay

## 2022-04-13 ENCOUNTER — Telehealth: Payer: Self-pay | Admitting: Pharmacist

## 2022-04-13 ENCOUNTER — Emergency Department (HOSPITAL_COMMUNITY)
Admission: EM | Admit: 2022-04-13 | Discharge: 2022-04-13 | Discharge status: Against Medical Advice | Payer: Medicare Other

## 2022-04-13 ENCOUNTER — Other Ambulatory Visit: Payer: Self-pay | Admitting: Urology

## 2022-04-13 MED ORDER — TRIMETHOPRIM 100 MG PO TABS
50.0000 mg | ORAL_TABLET | Freq: Every day | ORAL | 0 refills | Status: DC
  Filled 2022-04-13: qty 15, 30d supply, fill #0

## 2022-04-13 MED ORDER — APIXABAN 2.5 MG PO TABS
2.5000 mg | ORAL_TABLET | Freq: Two times a day (BID) | ORAL | 11 refills | Status: DC
  Filled 2022-04-13: qty 60, 30d supply, fill #0

## 2022-04-13 NOTE — Telephone Encounter (Signed)
Patient gets the Eliquis from the [facility].  Updated lower dose Eliquis prescirption was called in [phone]).  Daughter was informed.

## 2022-04-13 NOTE — Telephone Encounter (Addendum)
Patient with diagnosis of PAF(paroxysmal atrial fibrillation) on Eliquis for anticoagulation.    Procedure: bilateral ureteroscopy  Date of procedure: 05/03/2022   CHA2DS2-VASc Score = 6   This indicates a 9.7% annual risk of stroke. The patient's score is based upon: CHF History: 0 HTN History: 1 Diabetes History: 0 Stroke History: 2 Vascular Disease History: 1 Age Score: 2 Gender Score: 0     CrCl 17 ml/min (SrCr 2.50 04/07/2022) Platelet count : no updated lab available  Due to age(>80) and SrCr (>=1.5) the current dose of Apixaban is inappropriate, will call in new prescription for Eliquis 2.5 mg twice daily and inform patient to start taking new dose   Patient can hold Eliquis for 2 days without bridging. Patient is at high risk off anticoagulation due to hx of stroke. Please resume anticoagulation as soon as safely possible.    **This guidance is not considered finalized until pre-operative APP has relayed final recommendations.**

## 2022-04-13 NOTE — Telephone Encounter (Signed)
   Name: Richard Reed  DOB: 28-Mar-1926  MRN: [id number]  Primary Cardiologist: Quay Burow, MD   Preoperative team, please contact this patient and set up a phone call appointment for further preoperative risk assessment. Please obtain consent and complete medication review. Thank you for your help.  I confirm that guidance regarding antiplatelet and oral anticoagulation therapy has been completed and, if necessary, noted below.  Patient can hold Eliquis for 2 days without bridging. Patient is at high risk off anticoagulation due to hx of stroke. Please resume anticoagulation as soon as safely possible.    Trudi Ida, NP 04/13/2022, 4:49 PM [place]

## 2022-04-14 NOTE — Telephone Encounter (Signed)
Call placed to pt regarding surgical clearance and the need for a tele visit.  Left pt a message to call back and ask for the preop team. 

## 2022-04-15 ENCOUNTER — Other Ambulatory Visit (HOSPITAL_COMMUNITY): Payer: Self-pay

## 2022-04-15 NOTE — Telephone Encounter (Signed)
DPR ok to s/w the pt's daughter [name]. I informed [name] that the pt is going to need a tele pre op appt for cardiac clearance. [name] states she is in [place] currently but she will have [name] do the call with the pt. [name] gave me [name]'s phone # and said she will call [name] to let her know that I will be calling to schedule a tele appt for the pt. I will make note that [name] has given permission on to s/w [name]. She states the pt is very HOH.

## 2022-04-19 ENCOUNTER — Telehealth: Payer: Self-pay | Admitting: *Deleted

## 2022-04-19 NOTE — Telephone Encounter (Signed)
See previous notes from Friday ok per [name], [place] to s/w [name] on pt's behalf.    I s/w [name] and she has scheduled a tele pre op appt 04/22/22 @ 2:40 for the pt. Med rec and consent are done.

## 2022-04-19 NOTE — Telephone Encounter (Addendum)
ADDENDUM 04/22/2022: Patient scheduled for virtual visit today, 04/22/2022 at 2:40 PM. However, patient is currently admitted to the hospital.  Will ask preop coverage team to cancel patient's virtual visit.  Given recent hospitalization, patient will need OV prior to surgical clearance.   See previous notes from Friday ok per [name], [place] to s/w [name] on pt's behalf.   I s/w [name] and she has scheduled a tele pre op appt 04/22/22 @ 2:40 for the pt. Med rec and consent are done.      Patient Consent for Virtual Visit        Richard Reed has provided verbal consent on 04/19/2022 for a virtual visit (video or telephone).   CONSENT FOR VIRTUAL VISIT FOR:  Richard Reed  By participating in this virtual visit I agree to the following:  I hereby voluntarily request, consent and authorize [facility] and its employed or contracted physicians, physician assistants, nurse practitioners or other licensed health care professionals (the Practitioner), to provide me with telemedicine health care services (the "Services") as deemed necessary by the treating Practitioner. I acknowledge and consent to receive the Services by the Practitioner via telemedicine. I understand that the telemedicine visit will involve communicating with the Practitioner through live audiovisual communication technology and the disclosure of certain medical information by electronic transmission. I acknowledge that I have been given the opportunity to request an in-person assessment or other available alternative prior to the telemedicine visit and am voluntarily participating in the telemedicine visit.  I understand that I have the right to withhold or withdraw my consent to the use of telemedicine in the course of my care at any time, without affecting my right to future care or treatment, and that the Practitioner or I may terminate the telemedicine visit at any time. I understand that I have the right to inspect all  information obtained and/or recorded in the course of the telemedicine visit and may receive copies of available information for a reasonable fee.  I understand that some of the potential risks of receiving the Services via telemedicine include:  Delay or interruption in medical evaluation due to technological equipment failure or disruption; Information transmitted may not be sufficient (e.g. poor resolution of images) to allow for appropriate medical decision making by the Practitioner; and/or  In rare instances, security protocols could fail, causing a breach of personal health information.  Furthermore, I acknowledge that it is my responsibility to provide information about my medical history, conditions and care that is complete and accurate to the best of my ability. I acknowledge that Practitioner's advice, recommendations, and/or decision may be based on factors not within their control, such as incomplete or inaccurate data provided by me or distortions of diagnostic images or specimens that may result from electronic transmissions. I understand that the practice of medicine is not an exact science and that Practitioner makes no warranties or guarantees regarding treatment outcomes. I acknowledge that a copy of this consent can be made available to me via my patient portal ([facility]), or I can request a printed copy by calling the office of [facility].    I understand that my insurance will be billed for this visit.   I have read or had this consent read to me. I understand the contents of this consent, which adequately explains the benefits and risks of the Services being provided via telemedicine.  I have been provided ample opportunity to ask questions regarding this consent  and the Services and have had my questions answered to my satisfaction. I give my informed consent for the services to be provided through the use of telemedicine in my medical care

## 2022-04-20 ENCOUNTER — Encounter (HOSPITAL_COMMUNITY): Payer: Self-pay

## 2022-04-20 ENCOUNTER — Inpatient Hospital Stay (HOSPITAL_COMMUNITY)
Admission: EM | Admit: 2022-04-20 | Discharge: 2022-04-26 | DRG: 987 | Disposition: A | Discharge status: Home / Self Care | Payer: Medicare Other | Attending: Family Medicine | Admitting: Family Medicine

## 2022-04-20 ENCOUNTER — Inpatient Hospital Stay (HOSPITAL_COMMUNITY): Payer: Medicare Other

## 2022-04-20 ENCOUNTER — Other Ambulatory Visit: Payer: Self-pay

## 2022-04-20 ENCOUNTER — Emergency Department (HOSPITAL_COMMUNITY): Payer: Medicare Other

## 2022-04-20 DIAGNOSIS — K921 Melena: Secondary | ICD-10-CM | POA: Diagnosis not present

## 2022-04-20 DIAGNOSIS — R0902 Hypoxemia: Secondary | ICD-10-CM | POA: Diagnosis not present

## 2022-04-20 DIAGNOSIS — Z8042 Family history of malignant neoplasm of prostate: Secondary | ICD-10-CM

## 2022-04-20 DIAGNOSIS — I69398 Other sequelae of cerebral infarction: Secondary | ICD-10-CM

## 2022-04-20 DIAGNOSIS — N401 Enlarged prostate with lower urinary tract symptoms: Secondary | ICD-10-CM | POA: Diagnosis present

## 2022-04-20 DIAGNOSIS — K2971 Gastritis, unspecified, with bleeding: Secondary | ICD-10-CM

## 2022-04-20 DIAGNOSIS — R338 Other retention of urine: Secondary | ICD-10-CM | POA: Diagnosis present

## 2022-04-20 DIAGNOSIS — T39395A Adverse effect of other nonsteroidal anti-inflammatory drugs [NSAID], initial encounter: Secondary | ICD-10-CM

## 2022-04-20 DIAGNOSIS — E785 Hyperlipidemia, unspecified: Secondary | ICD-10-CM | POA: Diagnosis present

## 2022-04-20 DIAGNOSIS — J449 Chronic obstructive pulmonary disease, unspecified: Secondary | ICD-10-CM | POA: Diagnosis not present

## 2022-04-20 DIAGNOSIS — D6832 Hemorrhagic disorder due to extrinsic circulating anticoagulants: Principal | ICD-10-CM | POA: Diagnosis present

## 2022-04-20 DIAGNOSIS — K449 Diaphragmatic hernia without obstruction or gangrene: Secondary | ICD-10-CM | POA: Diagnosis not present

## 2022-04-20 DIAGNOSIS — I351 Nonrheumatic aortic (valve) insufficiency: Secondary | ICD-10-CM | POA: Diagnosis not present

## 2022-04-20 DIAGNOSIS — N179 Acute kidney failure, unspecified: Secondary | ICD-10-CM | POA: Diagnosis not present

## 2022-04-20 DIAGNOSIS — I4891 Unspecified atrial fibrillation: Secondary | ICD-10-CM | POA: Diagnosis not present

## 2022-04-20 DIAGNOSIS — N1831 Chronic kidney disease, stage 3a: Secondary | ICD-10-CM | POA: Diagnosis not present

## 2022-04-20 DIAGNOSIS — J61 Pneumoconiosis due to asbestos and other mineral fibers: Secondary | ICD-10-CM | POA: Diagnosis not present

## 2022-04-20 DIAGNOSIS — I480 Paroxysmal atrial fibrillation: Secondary | ICD-10-CM | POA: Diagnosis present

## 2022-04-20 DIAGNOSIS — I428 Other cardiomyopathies: Secondary | ICD-10-CM | POA: Diagnosis present

## 2022-04-20 DIAGNOSIS — K5730 Diverticulosis of large intestine without perforation or abscess without bleeding: Secondary | ICD-10-CM | POA: Diagnosis not present

## 2022-04-20 DIAGNOSIS — I5042 Chronic combined systolic (congestive) and diastolic (congestive) heart failure: Secondary | ICD-10-CM | POA: Diagnosis not present

## 2022-04-20 DIAGNOSIS — I130 Hypertensive heart and chronic kidney disease with heart failure and stage 1 through stage 4 chronic kidney disease, or unspecified chronic kidney disease: Secondary | ICD-10-CM | POA: Diagnosis not present

## 2022-04-20 DIAGNOSIS — Z7901 Long term (current) use of anticoagulants: Secondary | ICD-10-CM | POA: Diagnosis not present

## 2022-04-20 DIAGNOSIS — K922 Gastrointestinal hemorrhage, unspecified: Secondary | ICD-10-CM | POA: Diagnosis present

## 2022-04-20 DIAGNOSIS — K5791 Diverticulosis of intestine, part unspecified, without perforation or abscess with bleeding: Secondary | ICD-10-CM | POA: Diagnosis present

## 2022-04-20 DIAGNOSIS — Z9049 Acquired absence of other specified parts of digestive tract: Secondary | ICD-10-CM

## 2022-04-20 DIAGNOSIS — D62 Acute posthemorrhagic anemia: Secondary | ICD-10-CM | POA: Diagnosis not present

## 2022-04-20 DIAGNOSIS — K625 Hemorrhage of anus and rectum: Secondary | ICD-10-CM | POA: Diagnosis not present

## 2022-04-20 DIAGNOSIS — R202 Paresthesia of skin: Secondary | ICD-10-CM | POA: Diagnosis not present

## 2022-04-20 DIAGNOSIS — E875 Hyperkalemia: Secondary | ICD-10-CM | POA: Diagnosis not present

## 2022-04-20 DIAGNOSIS — I2510 Atherosclerotic heart disease of native coronary artery without angina pectoris: Secondary | ICD-10-CM | POA: Diagnosis not present

## 2022-04-20 DIAGNOSIS — Z87891 Personal history of nicotine dependence: Secondary | ICD-10-CM

## 2022-04-20 DIAGNOSIS — T45515A Adverse effect of anticoagulants, initial encounter: Secondary | ICD-10-CM | POA: Diagnosis not present

## 2022-04-20 DIAGNOSIS — Z79899 Other long term (current) drug therapy: Secondary | ICD-10-CM

## 2022-04-20 DIAGNOSIS — H9113 Presbycusis, bilateral: Secondary | ICD-10-CM | POA: Diagnosis present

## 2022-04-20 DIAGNOSIS — Z8249 Family history of ischemic heart disease and other diseases of the circulatory system: Secondary | ICD-10-CM

## 2022-04-20 DIAGNOSIS — Z8546 Personal history of malignant neoplasm of prostate: Secondary | ICD-10-CM

## 2022-04-20 DIAGNOSIS — R000 Tachycardia, unspecified: Secondary | ICD-10-CM | POA: Diagnosis not present

## 2022-04-20 DIAGNOSIS — Z87442 Personal history of urinary calculi: Secondary | ICD-10-CM

## 2022-04-20 DIAGNOSIS — N261 Atrophy of kidney (terminal): Secondary | ICD-10-CM | POA: Diagnosis not present

## 2022-04-20 DIAGNOSIS — R58 Hemorrhage, not elsewhere classified: Secondary | ICD-10-CM | POA: Diagnosis not present

## 2022-04-20 HISTORY — PX: IR EMBO ART  VEN HEMORR LYMPH EXTRAV  INC GUIDE ROADMAPPING: IMG5450

## 2022-04-20 HISTORY — PX: IR US GUIDE VASC ACCESS RIGHT: IMG2390

## 2022-04-20 HISTORY — PX: IR ANGIOGRAM VISCERAL SELECTIVE: IMG657

## 2022-04-20 HISTORY — PX: IR ANGIOGRAM SELECTIVE EACH ADDITIONAL VESSEL: IMG667

## 2022-04-20 LAB — CBC WITH DIFFERENTIAL/PLATELET
Abs Immature Granulocytes: 0.05 10*3/uL (ref 0.00–0.07)
Basophils Absolute: 0 10*3/uL (ref 0.0–0.1)
Basophils Relative: 0 %
Eosinophils Absolute: 0 10*3/uL (ref 0.0–0.5)
Eosinophils Relative: 0 %
HCT: 30.1 % — ABNORMAL LOW (ref 39.0–52.0)
Hemoglobin: 9.8 g/dL — ABNORMAL LOW (ref 13.0–17.0)
Immature Granulocytes: 1 %
Lymphocytes Relative: 6 %
Lymphs Abs: 0.5 10*3/uL — ABNORMAL LOW (ref 0.7–4.0)
MCH: 32.6 pg (ref 26.0–34.0)
MCHC: 32.6 g/dL (ref 30.0–36.0)
MCV: 100 fL (ref 80.0–100.0)
Monocytes Absolute: 0.4 10*3/uL (ref 0.1–1.0)
Monocytes Relative: 5 %
Neutro Abs: 6.5 10*3/uL (ref 1.7–7.7)
Neutrophils Relative %: 88 %
Platelets: 237 10*3/uL (ref 150–400)
RBC: 3.01 MIL/uL — ABNORMAL LOW (ref 4.22–5.81)
RDW: 14.3 % (ref 11.5–15.5)
WBC: 7.4 10*3/uL (ref 4.0–10.5)
nRBC: 0 % (ref 0.0–0.2)

## 2022-04-20 LAB — HEPARIN LEVEL (UNFRACTIONATED): Heparin Unfractionated: >1.1 IU/mL — ABNORMAL HIGH (ref 0.30–0.70)

## 2022-04-20 LAB — I-STAT CHEM 8, ED
BUN: 47 mg/dL — ABNORMAL HIGH (ref 8–23)
Calcium, Ion: 1.16 mmol/L (ref 1.15–1.40)
Chloride: 104 mmol/L (ref 98–111)
Creatinine, Ser: 1.9 mg/dL — ABNORMAL HIGH (ref 0.61–1.24)
Glucose, Bld: 131 mg/dL — ABNORMAL HIGH (ref 70–99)
HCT: 24 % — ABNORMAL LOW (ref 39.0–52.0)
Hemoglobin: 8.2 g/dL — ABNORMAL LOW (ref 13.0–17.0)
Potassium: 5 mmol/L (ref 3.5–5.1)
Sodium: 134 mmol/L — ABNORMAL LOW (ref 135–145)
TCO2: 21 mmol/L — ABNORMAL LOW (ref 22–32)

## 2022-04-20 LAB — COMPREHENSIVE METABOLIC PANEL
ALT: 13 U/L (ref 0–44)
AST: 16 U/L (ref 15–41)
Albumin: 3.2 g/dL — ABNORMAL LOW (ref 3.5–5.0)
Alkaline Phosphatase: 55 U/L (ref 38–126)
Anion gap: 9 (ref 5–15)
BUN: 55 mg/dL — ABNORMAL HIGH (ref 8–23)
CO2: 22 mmol/L (ref 22–32)
Calcium: 8.3 mg/dL — ABNORMAL LOW (ref 8.9–10.3)
Chloride: 103 mmol/L (ref 98–111)
Creatinine, Ser: 1.66 mg/dL — ABNORMAL HIGH (ref 0.61–1.24)
GFR, Estimated: 38 mL/min — ABNORMAL LOW (ref >60)
Glucose, Bld: 135 mg/dL — ABNORMAL HIGH (ref 70–99)
Potassium: 5 mmol/L (ref 3.5–5.1)
Sodium: 134 mmol/L — ABNORMAL LOW (ref 135–145)
Total Bilirubin: 0.6 mg/dL (ref 0.3–1.2)
Total Protein: 5.8 g/dL — ABNORMAL LOW (ref 6.5–8.1)

## 2022-04-20 LAB — PROTIME-INR
INR: 1.5 — ABNORMAL HIGH (ref 0.8–1.2)
Prothrombin Time: 17.5 seconds — ABNORMAL HIGH (ref 11.4–15.2)

## 2022-04-20 LAB — APTT: aPTT: 31 seconds (ref 24–36)

## 2022-04-20 LAB — PREPARE RBC (CROSSMATCH)

## 2022-04-20 LAB — AMMONIA: Ammonia: 12 umol/L (ref 9–35)

## 2022-04-20 LAB — LIPASE, BLOOD: Lipase: 39 U/L (ref 11–51)

## 2022-04-20 LAB — POC OCCULT BLOOD, ED: Fecal Occult Bld: POSITIVE — AB

## 2022-04-20 LAB — LACTIC ACID, PLASMA: Lactic Acid, Venous: 2 mmol/L (ref 0.5–1.9)

## 2022-04-20 MED ORDER — PANTOPRAZOLE INFUSION (NEW) - SIMPLE MED
8.0000 mg/h | INTRAVENOUS | Status: AC
  Administered 2022-04-20 – 2022-04-23 (×7): 8 mg/h via INTRAVENOUS
  Filled 2022-04-20: qty 100
  Filled 2022-04-20: qty 80
  Filled 2022-04-20 (×2): qty 100
  Filled 2022-04-20 (×5): qty 80
  Filled 2022-04-20: qty 100

## 2022-04-20 MED ORDER — POLYVINYL ALCOHOL 1.4 % OP SOLN: Freq: Two times a day (BID) | OPHTHALMIC | Status: DC | PRN

## 2022-04-20 MED ORDER — DILTIAZEM HCL 25 MG/5ML IV SOLN
10.0000 mg | Freq: Once | INTRAVENOUS | Status: AC
  Administered 2022-04-20: 10 mg via INTRAVENOUS
  Filled 2022-04-20: qty 5

## 2022-04-20 MED ORDER — IOHEXOL 350 MG/ML SOLN
80.0000 mL | Freq: Once | INTRAVENOUS | Status: AC | PRN
  Administered 2022-04-20: 80 mL via INTRAVENOUS

## 2022-04-20 MED ORDER — PROTHROMBIN COMPLEX CONC HUMAN 500 UNITS IV KIT
3282.0000 [IU] | PACK | Status: AC
  Administered 2022-04-20: 3282 [IU] via INTRAVENOUS
  Filled 2022-04-20: qty 3282

## 2022-04-20 MED ORDER — PANTOPRAZOLE 80MG IVPB - SIMPLE MED
80.0000 mg | Freq: Once | INTRAVENOUS | Status: AC
  Administered 2022-04-20: 80 mg via INTRAVENOUS
  Filled 2022-04-20: qty 80

## 2022-04-20 MED ORDER — IOHEXOL 300 MG/ML  SOLN
100.0000 mL | Freq: Once | INTRAMUSCULAR | Status: AC | PRN
  Administered 2022-04-20: 44 mL via INTRA_ARTERIAL

## 2022-04-20 MED ORDER — DILTIAZEM HCL-DEXTROSE 125-5 MG/125ML-% IV SOLN (PREMIX)
5.0000 mg/h | INTRAVENOUS | Status: DC
  Administered 2022-04-20: 5 mg/h via INTRAVENOUS
  Administered 2022-04-21 – 2022-04-22 (×3): 15 mg/h via INTRAVENOUS
  Filled 2022-04-20 (×5): qty 125

## 2022-04-20 MED ORDER — FINASTERIDE 5 MG PO TABS
5.0000 mg | ORAL_TABLET | Freq: Every day | ORAL | Status: DC
  Administered 2022-04-21 – 2022-04-25 (×5): 5 mg via ORAL
  Filled 2022-04-20 (×5): qty 1

## 2022-04-20 MED ORDER — IOHEXOL 300 MG/ML  SOLN
100.0000 mL | Freq: Once | INTRAMUSCULAR | Status: AC | PRN
  Administered 2022-04-20: 30 mL via INTRA_ARTERIAL

## 2022-04-20 MED ORDER — SODIUM CHLORIDE 0.9 % IV BOLUS
1000.0000 mL | Freq: Once | INTRAVENOUS | Status: AC
  Administered 2022-04-20: 1000 mL via INTRAVENOUS

## 2022-04-20 MED ORDER — MIDAZOLAM HCL 2 MG/2ML IJ SOLN
INTRAMUSCULAR | Status: AC
  Filled 2022-04-20: qty 2

## 2022-04-20 MED ORDER — FINASTERIDE 5 MG PO TABS: 5.0000 mg | ORAL_TABLET | Freq: Every day | ORAL | Status: DC

## 2022-04-20 MED ORDER — SODIUM CHLORIDE 0.9 % IV SOLN: INTRAVENOUS | Status: DC

## 2022-04-20 MED ORDER — FENTANYL CITRATE (PF) 100 MCG/2ML IJ SOLN
INTRAMUSCULAR | Status: AC
  Filled 2022-04-20: qty 2

## 2022-04-20 MED ORDER — ONDANSETRON HCL 4 MG/2ML IJ SOLN
4.0000 mg | Freq: Once | INTRAMUSCULAR | Status: AC
  Administered 2022-04-20: 4 mg via INTRAVENOUS
  Filled 2022-04-20: qty 2

## 2022-04-20 MED ORDER — TAMSULOSIN HCL 0.4 MG PO CAPS
0.4000 mg | ORAL_CAPSULE | Freq: Every day | ORAL | Status: DC
  Administered 2022-04-20 – 2022-04-26 (×7): 0.4 mg via ORAL
  Filled 2022-04-20 (×7): qty 1

## 2022-04-20 MED ORDER — LIDOCAINE HCL 1 % IJ SOLN
INTRAMUSCULAR | Status: AC
  Administered 2022-04-20: 10 mL
  Filled 2022-04-20: qty 20

## 2022-04-20 MED ORDER — SODIUM CHLORIDE 0.9% IV SOLUTION: Freq: Once | INTRAVENOUS | Status: AC

## 2022-04-20 NOTE — H&P (Addendum)
HPI  Richard Reed [id number] DOB: [birth year] DOA: 04/20/2022  PCP: [name], FNP   Chief Complaint: Rectal bleeding  HPI:  89 year old white male known history of paroxysmal atrial fibrillation on anticoagulation diverticulosis previous CVA with right MCA aneurysm CKD 3 arthritis asbestosis COPD prior BPH--seen by [name] for kidney stones in 2012 and KUB 2023 Prior admission 12/01/2018 with GI bleed aspirin was held patient did not have colonoscopy  Recent surgery 04/07/2022 under [name] cystoscopy and bladder irrigation  Seems to have had some bleeding that commenced about 48 hours prior to coming to ED on 1/3 [place] unformed stool noted on pictures on daughter's phone who gives most of the history (patient is quite hard of hearing) He himself is unable to really give a review of systems but looks comfortable He was worked up in the emergency room and found to have further anemia etc. as below    ED Course: Started on Protonix, Kcentra ordered, blood ordered, placed on saline 125 cc/H Cardizem 10 given Cardizem drip ordered by me Dr. Pamalee Leyden GI was consulted and recommended CTA to rule out active extravasation/bleeding   Past Medical History:  Diagnosis Date   Angina    Aortic valve insufficiency, moderate    a. 02/2016 Echo: mild AS, mild to mod AI.   Arthritis    Arthritis pain    CAD (coronary artery disease)    a. 09/2010 Cath: RCA 40-60 ost, otw nl cors;  b. 03/2016 Lexiscan MV: EF 46%, no ischemia, low risk.   Cancer [facility])    Carotid disease, bilateral ([place])    a. 02/2016 Carotid U/S: 1-39% bilat ICA plaquing.   Cerebral aneurysm    a. 02/2016 MRA: fusiform bilobed aneurysm @ R MCA bifurcation.   Cerebrovascular disease    a. 02/2016 MRA: long segment narrowing of basilar artery, moderate bilat posterior inferior cerebellar artery dzs.   Chronic combined systolic and diastolic CHF (congestive heart failure) ([place])    a. 02/2016 Admitted to  hospital in [place] w/ CHF;  b. 02/2016 Echo ([facility]): EF 45-50%, diff HK, gr1DD, mild AS, mild to mod AI, mildly dil Ao root, PASP 35mHg.   CKD (chronic kidney disease), stage III ([place]    Dysrhythmia    Essential hypertension    Hard of hearing    Hemiparesis and alteration of sensations as late effects of stroke ([place] 04/29/2015   Hiatal hernia    History of kidney stones    HOH (hard of hearing)    Hyperlipidemia    Kidney stones    Moderate aortic insufficiency    NICM (nonischemic cardiomyopathy) ([place]    a. 02/2016 Echo: EF 45-50%, diff HK;  b. 03/2016 Lexiscan MV: No ischemia, EF 46%.   NSVT (nonsustained ventricular tachycardia) ([facility]    a. 03/2016 noted on event monitor.   PAF (paroxysmal atrial fibrillation) ([facility]    a. 2012 - noted in setting of E coli bacteremia and septic shock.   Pneumonia 03/23/2011   "first time"   Prostate cancer ([place]    Sepsis due to Escherichia coli ([place] 03/24/11   Syncope    a. 02/2016 presumed to be 2/2 hypotension/dehydration and basilar artery dzs noted on MRA.   TIA (transient ischemic attack)    Vertebrobasilar insufficiency 04/28/2016   Visual disturbance 03/19/2014   Past Surgical History:  Procedure Laterality Date   CARDIAC CATHETERIZATION  09/2010   non obstructive CAD, 40-60% ostial RCA stenosis without dampening  CATARACT EXTRACTION Bilateral    cerebral angiography     CHOLECYSTECTOMY  04/15/2011   Procedure: LAPAROSCOPIC CHOLECYSTECTOMY WITH INTRAOPERATIVE CHOLANGIOGRAM;  Surgeon: Belva Crome, MD;  Location: [facility];  Service: General;  Laterality: N/A;   CYSTOSCOPY WITH STENT PLACEMENT Bilateral 04/07/2022   Procedure: CYSTOSCOPY WITH BILATERAL STENT PLACEMENT;  Surgeon: [name], MD;  Location: [facility];  Service: Urology;  Laterality: Bilateral;  30 MINS FRO CASE   EXTRACORPOREAL SHOCK WAVE LITHOTRIPSY Left 12/12/2016   Procedure: LEFT EXTRACORPOREAL SHOCK WAVE LITHOTRIPSY (ESWL);  Surgeon: Ardis Hughs, MD;   Location: [facility];  Service: Urology;  Laterality: Left;   IR ANGIO INTRA EXTRACRAN SEL COM CAROTID INNOMINATE BILAT MOD SED  03/21/2019   IR ANGIO VERTEBRAL SEL SUBCLAVIAN INNOMINATE BILAT MOD SED  03/21/2019   IR GENERIC HISTORICAL  03/17/2016   IR ANGIO VERTEBRAL SEL SUBCLAVIAN INNOMINATE BILAT MOD SED 03/17/2016 [name], MD [facility]   IR GENERIC HISTORICAL  03/17/2016   IR ANGIO INTRA EXTRACRAN SEL COM CAROTID INNOMINATE BILAT MOD SED 03/17/2016 [name], MD [facility]   PROSTATE SURGERY     approx 10 years ago, seed implant radiation   TONSILLECTOMY      reports that he has never smoked. He has never used smokeless tobacco. He reports that he does not drink alcohol and does not use drugs.  Mobility: Patient is basically quite independent at baseline cooks cleans able to do most things for himself cognitively is intact  No Known Allergies Family History  Problem Relation Age of Onset   Heart failure Mother    Heart disease Mother    Heart disease Father    Cancer Brother        prostate   Prior to Admission medications   Medication Sig Start Date End Date Taking? Authorizing Provider  apixaban (ELIQUIS) 2.5 MG TABS tablet Take 1 tablet (2.5 mg total) by mouth 2 (two) times daily. 04/13/22   Lorretta Harp, MD  B Complex-C (SUPER B COMPLEX PO) Take 1 tablet by mouth daily.    [provider]  cephALEXin (KEFLEX) 500 MG capsule Take 1 capsule by mouth twice daily 04/06/22   [name], MD  ciprofloxacin (CIPRO) 500 MG tablet Take 1 tablet by mouth 2 times a day 06/29/21     feeding supplement, ENSURE ENLIVE, (ENSURE ENLIVE) LIQD Take 237 mLs by mouth 2 (two) times daily between meals. Patient taking differently: Take 237 mLs by mouth daily. 03/02/16   Geradine Girt, DO  finasteride (PROSCAR) 5 MG tablet Take 1 tablet by mouth daily 11/24/21     finasteride (PROSCAR) 5 MG tablet Take 1 tablet (5 mg total) by mouth daily. 02/24/22      fluorouracil (EFUDEX) 5 % cream Apply to the affected area twice a day on the scalp x 4 weeks as tolerated 10/28/21     furosemide (LASIX) 20 MG tablet TAKE 1 TABLET BY MOUTH DAILY. 03/29/21 06/29/22  Lorretta Harp, MD  HYDROcodone-acetaminophen (NORCO/VICODIN) 5-325 MG tablet Take 1 tablet by mouth every 4 (four) hours as needed for moderate pain. 04/07/22   [name], MD  metoprolol succinate (TOPROL XL) 25 MG 24 hr tablet Take 1 tablet (25 mg) by mouth in the morning, and 1/2 tablet (12.5 mg) by mouth in the evening. Patient taking differently: Take 12.5-25 mg by mouth See admin instructions. Take 12.5 mg by mouth in the morning, and 25 mg in the evening. 03/29/22   [name],  Pearletha Forge, MD  Multiple Vitamins-Minerals (MULTIVITAMIN WITH MINERALS) tablet Take 1 tablet by mouth daily.    [provider]  Multiple Vitamins-Minerals (PRESERVISION AREDS 2) CAPS Take 1 capsule by mouth in the morning and at bedtime.    [provider]  Propylene Glycol (SYSTANE BALANCE OP) Place 1 drop into both eyes 2 (two) times daily.    [provider]  rosuvastatin (CRESTOR) 10 MG tablet TAKE 1 TABLET BY MOUTH DAILY. Patient taking differently: Take 10 mg by mouth at bedtime. 12/27/21 12/27/22  Lorretta Harp, MD  sulfamethoxazole-trimethoprim (BACTRIM) 400-80 MG tablet Take 1 tablet by mouth 2 (two) times daily. Patient not taking: Reported on 04/19/2022 04/11/22     tamsulosin (FLOMAX) 0.4 MG CAPS capsule Take 1 capsule (0.4 mg total) by mouth daily. 12/12/16   Ardis Hughs, MD  tamsulosin (FLOMAX) 0.4 MG CAPS capsule Take 1 capsule by mouth at bedtime. 06/02/21     tamsulosin (FLOMAX) 0.4 MG CAPS capsule Take 1 capsule (0.4 mg total) by mouth nightly at bedtime. 02/24/22     trimethoprim (TRIMPEX) 100 MG tablet Take 1/2 tablet (50 mg total) by mouth at bedtime once current antibiotic is finished. Patient not taking: Reported on 04/19/2022 04/13/22       Physical  Exam:  Vitals:   04/20/22 1507  SpO2: 100%    Awake coherent but extremely hard of hearing cannot understand what I am saying even with hearing aids Chest clear no added sound rales rhonchi wheeze ROM intact no focal deficit moving 4 limbs equally Abdomen soft nontender no rebound no guarding Skin soft supple various areas of SK, possible basal cells Psych euthymic coherent pleasant  I have personally reviewed following labs and imaging studies  Labs:  Hemoglobin baseline 13.3 04/07/2022-->8.2  BUNs/creatinine up from 33/2.5-->55/1.6 WBC 7 FOBT positive INR 1.5  Imaging studies:     Medical tests:  EKG independently reviewed: Confirms A-fib RVR with no significant ST-T wave changes  Test discussed with performing physician: Dr. Wyvonnia Dusky  Decision to obtain old records:  Yes  Review and summation of old records:  Yes  Active Problems:   * No active hospital problems. *   Assessment/Plan  GI bleed?  Diverticulosis -Undifferentiated unclear if upper or lower at this time await GI input -BUN/creatinine seems to suggest upper source, await CTA - Continue Protonix gtt. - Saline 125 cc/H - Ensure good IV access -Kcentra ordered to reverse bleeding as INR is elevated  A-fib RVR from PTA on Eliquis - Holding metoprolol XL will place on Cardizem gtt. -Will need to go to stepdown for management  AKI prior to admission superimposed on CKD 3 AA Mild hyperkalemia - Copious IV fluid repletion - Monitor trends, K should resolve in 24 hr with improvement in fluid status and AKI  Previous CVA and cerebral aneurysm - Cannot give aspirin in current setting - Eliquis on hold and follow in outpatient setting  Asbestosis?  COPD - Not on inhalers seems stable  Severity of Illness: The appropriate patient status for this patient is INPATIENT. Inpatient status is judged to be reasonable and necessary in order to provide the required intensity of service to ensure the  patient's safety. The patient's presenting symptoms, physical exam findings, and initial radiographic and laboratory data in the context of their chronic comorbidities is felt to place them at high risk for further clinical deterioration. Furthermore, it is not anticipated that the patient will be medically stable for discharge from the hospital  within 2 midnights of admission.   * I certify that at the point of admission it is my clinical judgment that the patient will require inpatient hospital care spanning beyond 2 midnights from the point of admission due to high intensity of service, high risk for further deterioration and high frequency of surveillance required.*   DVT prophylaxis:SCD Code Status: Full Family Communication: daughter/wife Consults called: GI by ED--Dr. Michail Sermon    Time spent: 44 minutes  Verlon Au, MD [days-call my NP partners at night for Care related issues] [facility] --[facility]ia [employer] [facility] , [email or web address]; password [facility]  04/20/2022, 3:44 PM

## 2022-04-20 NOTE — ED Provider Notes (Signed)
[facility] DEPT Provider Note   CSN: [id number] Arrival date & time: 04/20/22  1339     History  No chief complaint on file.   Richard Reed is a 89 y.o. male.  Level 5 caveat for hearing difficulty.  Patient brought in from home with rectal bleeding of uncertain duration.  Patient states has been ongoing for several days ever since he had ureteral stents put in December 21.  He does take Eliquis for history of atrial fibrillation.  He is not certain if he is still taking it.  Chart review shows it was likely stop for 2 days prior to his procedure.  He is reports dark blood in his stool multiple times a day for the past several days.  He does feel generally weak and lightheaded.  Denies abdominal pain, chest pain, nausea, vomiting, back pain.  No fever.  He does not know if he is still taking his Eliquis.  He denies any pain.  He does feel somewhat dizzy and lightheaded.  The history is provided by the patient and the EMS personnel.       Home Medications Prior to Admission medications   Medication Sig Start Date End Date Taking? Authorizing Provider  apixaban (ELIQUIS) 2.5 MG TABS tablet Take 1 tablet (2.5 mg total) by mouth 2 (two) times daily. 04/13/22   Lorretta Harp, MD  B Complex-C (SUPER B COMPLEX PO) Take 1 tablet by mouth daily.    [provider]  cephALEXin (KEFLEX) 500 MG capsule Take 1 capsule by mouth twice daily Patient not taking: Reported on 04/19/2022 04/06/22   [name], MD  ciprofloxacin (CIPRO) 500 MG tablet Take 1 tablet by mouth 2 times a day Patient not taking: Reported on 04/19/2022 06/29/21     feeding supplement, ENSURE ENLIVE, (ENSURE ENLIVE) LIQD Take 237 mLs by mouth 2 (two) times daily between meals. Patient taking differently: Take 237 mLs by mouth daily. 03/02/16   Geradine Girt, DO  finasteride (PROSCAR) 5 MG tablet Take 1 tablet by mouth daily Patient not taking: Reported on 04/19/2022 11/24/21      finasteride (PROSCAR) 5 MG tablet Take 1 tablet (5 mg total) by mouth daily. 02/24/22     fluorouracil (EFUDEX) 5 % cream Apply to the affected area twice a day on the scalp x 4 weeks as tolerated Patient not taking: Reported on 04/19/2022 10/28/21     furosemide (LASIX) 20 MG tablet TAKE 1 TABLET BY MOUTH DAILY. 03/29/21 06/29/22  Lorretta Harp, MD  HYDROcodone-acetaminophen (NORCO/VICODIN) 5-325 MG tablet Take 1 tablet by mouth every 4 (four) hours as needed for moderate pain. 04/07/22   [name], MD  metoprolol succinate (TOPROL XL) 25 MG 24 hr tablet Take 1 tablet (25 mg) by mouth in the morning, and 1/2 tablet (12.5 mg) by mouth in the evening. Patient taking differently: Take 12.5-25 mg by mouth See admin instructions. Take 12.5 mg by mouth in the morning, and 25 mg in the evening. 03/29/22   Lorretta Harp, MD  Multiple Vitamins-Minerals (MULTIVITAMIN WITH MINERALS) tablet Take 1 tablet by mouth daily.    [provider]  Multiple Vitamins-Minerals (PRESERVISION AREDS 2) CAPS Take 1 capsule by mouth in the morning and at bedtime.    [provider]  Propylene Glycol (SYSTANE BALANCE OP) Place 1 drop into both eyes 2 (two) times daily.    [provider]  rosuvastatin (CRESTOR) 10 MG tablet TAKE 1 TABLET BY MOUTH DAILY.  Patient taking differently: Take 10 mg by mouth at bedtime. 12/27/21 12/27/22  Lorretta Harp, MD  sulfamethoxazole-trimethoprim (BACTRIM) 400-80 MG tablet Take 1 tablet by mouth 2 (two) times daily. Patient not taking: Reported on 04/19/2022 04/11/22     tamsulosin (FLOMAX) 0.4 MG CAPS capsule Take 1 capsule (0.4 mg total) by mouth daily. Patient not taking: Reported on 04/19/2022 12/12/16   Ardis Hughs, MD  tamsulosin (FLOMAX) 0.4 MG CAPS capsule Take 1 capsule by mouth at bedtime. Patient not taking: Reported on 04/19/2022 06/02/21     tamsulosin (FLOMAX) 0.4 MG CAPS capsule Take 1 capsule (0.4 mg total) by mouth nightly at bedtime.  02/24/22     trimethoprim (TRIMPEX) 100 MG tablet Take 1/2 tablet (50 mg total) by mouth at bedtime once current antibiotic is finished. Patient not taking: Reported on 04/19/2022 04/13/22         Allergies    Patient has no known allergies.    Review of Systems   Review of Systems  Unable to perform ROS: Other    Physical Exam Updated Vital Signs There were no vitals taken for this visit. Physical Exam Vitals and nursing note reviewed.  Constitutional:      General: He is not in acute distress.    Appearance: He is well-developed.  HENT:     Head: Normocephalic and atraumatic.     Mouth/Throat:     Pharynx: No oropharyngeal exudate.  Eyes:     Conjunctiva/sclera: Conjunctivae normal.     Pupils: Pupils are equal, round, and reactive to light.  Neck:     Comments: No meningismus. Cardiovascular:     Rate and Rhythm: Tachycardia present. Rhythm irregular.     Heart sounds: Normal heart sounds. No murmur heard. Pulmonary:     Effort: Pulmonary effort is normal. No respiratory distress.     Breath sounds: Normal breath sounds.  Abdominal:     Palpations: Abdomen is soft.     Tenderness: There is no abdominal tenderness. There is no guarding or rebound.     Comments: Intact femoral pulses bilaterally  Genitourinary:    Comments: Maroon stool on rectal exam, chaperone present Hassell Done NT Musculoskeletal:        General: No tenderness. Normal range of motion.     Cervical back: Normal range of motion and neck supple.  Skin:    General: Skin is warm.  Neurological:     Mental Status: He is alert and oriented to person, place, and time.     Cranial Nerves: No cranial nerve deficit.     Motor: No abnormal muscle tone.     Coordination: Coordination normal.     Comments:  5/5 strength throughout. CN 2-12 intact.Equal grip strength.   Psychiatric:        Behavior: Behavior normal.     ED Results / Procedures / Treatments   Labs (all labs ordered are listed, but only  abnormal results are displayed) Labs Reviewed  COMPREHENSIVE METABOLIC PANEL - Abnormal; Notable for the following components:      Result Value   Sodium 134 (*)    Glucose, Bld 135 (*)    BUN 55 (*)    Creatinine, Ser 1.66 (*)    Calcium 8.3 (*)    Total Protein 5.8 (*)    Albumin 3.2 (*)    GFR, Estimated 38 (*)    All other components within normal limits  CBC WITH DIFFERENTIAL/PLATELET - Abnormal; Notable for the following components:  RBC 3.01 (*)    Hemoglobin 9.8 (*)    HCT 30.1 (*)    Lymphs Abs 0.5 (*)    All other components within normal limits  PROTIME-INR - Abnormal; Notable for the following components:   Prothrombin Time 17.5 (*)    INR 1.5 (*)    All other components within normal limits  HEPARIN LEVEL (UNFRACTIONATED) - Abnormal; Notable for the following components:   Heparin Unfractionated >1.10 (*)    All other components within normal limits  POC OCCULT BLOOD, ED - Abnormal; Notable for the following components:   Fecal Occult Bld POSITIVE (*)    All other components within normal limits  I-STAT CHEM 8, ED - Abnormal; Notable for the following components:   Sodium 134 (*)    BUN 47 (*)    Creatinine, Ser 1.90 (*)    Glucose, Bld 131 (*)    TCO2 21 (*)    Hemoglobin 8.2 (*)    HCT 24.0 (*)    All other components within normal limits  AMMONIA  LIPASE, BLOOD  APTT  LACTIC ACID, PLASMA  TYPE AND SCREEN  PREPARE RBC (CROSSMATCH)    EKG EKG Interpretation  Date/Time:  Wednesday April 20 2022 14:10:54 EST Ventricular Rate:  155 PR Interval:  127 QRS Duration: 86 QT Interval:  259 QTC Calculation: 416 R Axis:   -38 Text Interpretation: Atrial fibrillation with RVR Left axis deviation Abnormal R-wave progression, early transition Nonspecific repol abnormality, diffuse leads Baseline wander in lead(s) II Nonspecific ST abnormality Confirmed by Ezequiel Essex [phone]) on 04/20/2022 2:20:00 PM  Radiology No results found.  Procedures .Critical  Care  Performed by: Ezequiel Essex, MD Authorized by: Ezequiel Essex, MD   Critical care provider statement:    Critical care time (minutes):  63   Critical care time was exclusive of:  Separately billable procedures and treating other patients   Critical care was necessary to treat or prevent imminent or life-threatening deterioration of the following conditions: Gi bleed, A fib with RVR.   Critical care was time spent personally by me on the following activities:  Development of treatment plan with patient or surrogate, discussions with consultants, evaluation of patient's response to treatment, examination of patient, ordering and review of laboratory studies, ordering and review of radiographic studies, ordering and performing treatments and interventions, pulse oximetry, re-evaluation of patient's condition, review of old charts, blood draw for specimens and obtaining history from patient or surrogate   I assumed direction of critical care for this patient from another provider in my specialty: no     Care discussed with: admitting provider       Medications Ordered in ED Medications  sodium chloride 0.9 % bolus 1,000 mL (has no administration in time range)    And  0.9 %  sodium chloride infusion (has no administration in time range)  pantoprazole (PROTONIX) 80 mg /NS 100 mL IVPB (has no administration in time range)  pantoprozole (PROTONIX) 80 mg /NS 100 mL infusion (has no administration in time range)  ondansetron (ZOFRAN) injection 4 mg (has no administration in time range)    ED Course/ Medical Decision Making/ A&P                           Medical Decision Making Amount and/or Complexity of Data Reviewed Labs: ordered. Decision-making details documented in ED Course. Radiology: ordered and independent interpretation performed. Decision-making details documented in ED Course. ECG/medicine tests: ordered  and independent interpretation performed. Decision-making details  documented in ED Course.  Risk Prescription drug management. Decision regarding hospitalization.   Rectal bleeding on Eliquis.  A-fib with RVR rates about 130s to 150s.  Blood pressure 355 systolic on arrival.  Denies any pain.  Abdomen is soft and nontender.  No chest pain or shortness of breath.  Suspect likely diverticular bleed.  Blood pressure is adequate in the 150 range currently.  Patient in A-fib with RVR.  Does take Eliquis.  Denies any abdominal pain.  Hemoglobin 9.8 from 13 in December.  Patient agreeable to blood transfusion.  Discussed with family at bedside.  Will reverse Eliquis after discussion of risks and benefits with family at bedside.  Discussed with Dr. Michail Sermon of [facility].  He will evaluate patient.  Agrees with PPI infusion.  Will recommend CTA bleeding scan if creatinine can tolerate it.  Suspect likely diverticular bleed.  Patient given blood transfusion as well as Kcentra to reverse Eliquis.  Blood pressure has stabilized in the 1 97-4 20 systolic range.  Heart rate 140s to 150s with rapid A-fib.  Cardizem bolus given.  Patient denies any pain.  Creatinine is low but should be acceptable for low-dose IV contrast for CTA to evaluate for active bleed.  Admission discussed with Dr. Verlon Au.  CTA pending at time of admission.        Final Clinical Impression(s) / ED Diagnoses Final diagnoses:  Rectal bleeding  Atrial fibrillation with RVR [facility])    Rx / DC Orders ED Discharge Orders     None         Samer Dutton, Annie Main, MD 04/20/22 1606

## 2022-04-20 NOTE — Consult Note (Signed)
Referring Provider: Dr. Wyvonnia Dusky Primary Care Physician:  [name], FNP Primary Gastroenterologist:  Althia Forts  Reason for Consultation:  GI bleed  HPI: Richard Reed is a 89 y.o. male with the acute onset of rectal bleeding (family shows cellphone pics of dark red blood with brown stool X 3). He is not able to give me any history of the bleeding. Denies abdominal pain/N/V. On Eliquis for Afib. He is having difficulty urinating and denies having trouble at home with that. Family reports bilateral ureteral stents placed in December. He has NOT had a colonoscopy per family. He had painless hematochezia in 2020 and was medically managed. Hgb 8.2 on recheck and 9.8 this afternoon (13.3 on 04/07/22). Wife and daughter in ER. Nursing staff in room.  Past Medical History:  Diagnosis Date   Angina    Aortic valve insufficiency, moderate    a. 02/2016 Echo: mild AS, mild to mod AI.   Arthritis    Arthritis pain    CAD (coronary artery disease)    a. 09/2010 Cath: RCA 40-60 ost, otw nl cors;  b. 03/2016 Lexiscan MV: EF 46%, no ischemia, low risk.   Cancer [facility])    Carotid disease, bilateral ([place])    a. 02/2016 Carotid U/S: 1-39% bilat ICA plaquing.   Cerebral aneurysm    a. 02/2016 MRA: fusiform bilobed aneurysm @ R MCA bifurcation.   Cerebrovascular disease    a. 02/2016 MRA: long segment narrowing of basilar artery, moderate bilat posterior inferior cerebellar artery dzs.   Chronic combined systolic and diastolic CHF (congestive heart failure) ([place])    a. 02/2016 Admitted to hospital in [place] w/ CHF;  b. 02/2016 Echo ([facility]): EF 45-50%, diff HK, gr1DD, mild AS, mild to mod AI, mildly dil Ao root, PASP 57mHg.   CKD (chronic kidney disease), stage III ([place]    Dysrhythmia    Essential hypertension    Hard of hearing    Hemiparesis and alteration of sensations as late effects of stroke ([place] 04/29/2015   Hiatal hernia    History of kidney stones    HOH (hard of hearing)     Hyperlipidemia    Kidney stones    Moderate aortic insufficiency    NICM (nonischemic cardiomyopathy) ([place]    a. 02/2016 Echo: EF 45-50%, diff HK;  b. 03/2016 Lexiscan MV: No ischemia, EF 46%.   NSVT (nonsustained ventricular tachycardia) ([facility]    a. 03/2016 noted on event monitor.   PAF (paroxysmal atrial fibrillation) ([place]    a. 2012 - noted in setting of E coli bacteremia and septic shock.   Pneumonia 03/23/2011   "first time"   Prostate cancer ([facility]    Sepsis due to Escherichia coli ([place] 03/24/11   Syncope    a. 02/2016 presumed to be 2/2 hypotension/dehydration and basilar artery dzs noted on MRA.   TIA (transient ischemic attack)    Vertebrobasilar insufficiency 04/28/2016   Visual disturbance 03/19/2014    Past Surgical History:  Procedure Laterality Date   CARDIAC CATHETERIZATION  09/2010   non obstructive CAD, 40-60% ostial RCA stenosis without dampening    CATARACT EXTRACTION Bilateral    cerebral angiography     CHOLECYSTECTOMY  04/15/2011   Procedure: LAPAROSCOPIC CHOLECYSTECTOMY WITH INTRAOPERATIVE CHOLANGIOGRAM;  Surgeon: JBelva Crome MD;  [facility]  Service: General;  Laterality: N/A;   CYSTOSCOPY WITH STENT PLACEMENT Bilateral 04/07/2022   Procedure: CYSTOSCOPY WITH BILATERAL STENT PLACEMENT;  Surgeon: EFestus Aloe MD;  Location: [facility];  Service: Urology;  Laterality: Bilateral;  30 MINS FRO CASE   EXTRACORPOREAL SHOCK WAVE LITHOTRIPSY Left 12/12/2016   Procedure: LEFT EXTRACORPOREAL SHOCK WAVE LITHOTRIPSY (ESWL);  Surgeon: Ardis Hughs, MD;  Location: [facility];  Service: Urology;  Laterality: Left;   IR ANGIO INTRA EXTRACRAN SEL COM CAROTID INNOMINATE BILAT MOD SED  03/21/2019   IR ANGIO VERTEBRAL SEL SUBCLAVIAN INNOMINATE BILAT MOD SED  03/21/2019   IR GENERIC HISTORICAL  03/17/2016   IR ANGIO VERTEBRAL SEL SUBCLAVIAN INNOMINATE BILAT MOD SED 03/17/2016 [name], MD [facility]   IR GENERIC HISTORICAL  03/17/2016   IR ANGIO INTRA  EXTRACRAN SEL COM CAROTID INNOMINATE BILAT MOD SED 03/17/2016 [name], MD [facility]   PROSTATE SURGERY     approx 10 years ago, seed implant radiation   TONSILLECTOMY      Prior to Admission medications   Medication Sig Start Date End Date Taking? Authorizing Provider  B Complex-C (SUPER B COMPLEX PO) Take 1 tablet by mouth daily.   Yes [provider]  feeding supplement, ENSURE ENLIVE, (ENSURE ENLIVE) LIQD Take 237 mLs by mouth 2 (two) times daily between meals. Patient taking differently: Take 237 mLs by mouth daily. 03/02/16  Yes Geradine Girt, DO  Multiple Vitamins-Minerals (MULTIVITAMIN WITH MINERALS) tablet Take 1 tablet by mouth daily with breakfast.   Yes [provider]  Multiple Vitamins-Minerals (PRESERVISION AREDS 2) CAPS Take 1 capsule by mouth in the morning and at bedtime.   Yes [provider]  apixaban (ELIQUIS) 2.5 MG TABS tablet Take 1 tablet (2.5 mg total) by mouth 2 (two) times daily. 04/13/22   Lorretta Harp, MD  cephALEXin (KEFLEX) 500 MG capsule Take 1 capsule by mouth twice daily Patient not taking: Reported on 04/20/2022 04/06/22   [name], MD  ciprofloxacin (CIPRO) 500 MG tablet Take 1 tablet by mouth 2 times a day Patient not taking: Reported on 04/20/2022 06/29/21     finasteride (PROSCAR) 5 MG tablet Take 1 tablet by mouth daily 11/24/21     finasteride (PROSCAR) 5 MG tablet Take 1 tablet (5 mg total) by mouth daily. 02/24/22     fluorouracil (EFUDEX) 5 % cream Apply to the affected area twice a day on the scalp x 4 weeks as tolerated Patient not taking: Reported on 04/20/2022 10/28/21     furosemide (LASIX) 20 MG tablet TAKE 1 TABLET BY MOUTH DAILY. 03/29/21 06/29/22  Lorretta Harp, MD  HYDROcodone-acetaminophen (NORCO/VICODIN) 5-325 MG tablet Take 1 tablet by mouth every 4 (four) hours as needed for moderate pain. 04/07/22   [name], MD  metoprolol succinate (TOPROL XL) 25 MG 24 hr tablet Take 1 tablet  (25 mg) by mouth in the morning, and 1/2 tablet (12.5 mg) by mouth in the evening. Patient taking differently: Take 12.5-25 mg by mouth See admin instructions. Take 12.5 mg by mouth in the morning, and 25 mg in the evening. 03/29/22   Lorretta Harp, MD  Propylene Glycol (SYSTANE BALANCE OP) Place 1 drop into both eyes 2 (two) times daily.    [provider]  rosuvastatin (CRESTOR) 10 MG tablet TAKE 1 TABLET BY MOUTH DAILY. Patient taking differently: Take 10 mg by mouth at bedtime. 12/27/21 12/27/22  Lorretta Harp, MD  sulfamethoxazole-trimethoprim (BACTRIM) 400-80 MG tablet Take 1 tablet by mouth 2 (two) times daily. Patient not taking: Reported on 04/19/2022 04/11/22     tamsulosin (FLOMAX) 0.4 MG CAPS capsule Take 1 capsule (0.4 mg total) by  mouth daily. 12/12/16   Ardis Hughs, MD  tamsulosin (FLOMAX) 0.4 MG CAPS capsule Take 1 capsule by mouth at bedtime. 06/02/21     tamsulosin (FLOMAX) 0.4 MG CAPS capsule Take 1 capsule (0.4 mg total) by mouth nightly at bedtime. 02/24/22     trimethoprim (TRIMPEX) 100 MG tablet Take 1/2 tablet (50 mg total) by mouth at bedtime once current antibiotic is finished. Patient not taking: Reported on 04/19/2022 04/13/22       Scheduled Meds:  sodium chloride   Intravenous Once   diltiazem  10 mg Intravenous Once   Continuous Infusions:  sodium chloride     pantoprazole 8 mg/hr (04/20/22 1501)   prothrombin complex conc human (KCENTRA) IVPB 3,282 Units     PRN Meds:.iohexol  Allergies as of 04/20/2022   (No Known Allergies)    Family History  Problem Relation Age of Onset   Heart failure Mother    Heart disease Mother    Heart disease Father    Cancer Brother        prostate    Social History   Socioeconomic History   Marital status: Married    Spouse name: Not on file   Number of children: 2   Years of education: HS   Highest education level: Not on file  Occupational History   Occupation: Retired  Tobacco Use    Smoking status: Never   Smokeless tobacco: Never  Vaping Use   Vaping Use: Never used  Substance and Sexual Activity   Alcohol use: No   Drug use: No   Sexual activity: Yes  Other Topics Concern   Not on file  Social History Narrative   Medical Decision Maker:  REYAANSH MERLO (wife) Cell. [place]: [phone].   Daughter: Nigel Sloop: Cell [phone]   Full Code   Patient is Left handed.   Patient drinks 1 cup of caffeine daily.   Patient is left handed.   Social Determinants of Health   Financial Resource Strain: Not on file  Food Insecurity: Not on file  Transportation Needs: Not on file  Physical Activity: Not on file  Stress: Not on file  Social Connections: Not on file  Intimate Partner Violence: Not on file    Review of Systems: All negative except as stated above in HPI.  Physical Exam: Vital signs: Vitals:   04/20/22 1507  SpO2: 100%  T 98, P 84, BP 121/70   General:   Elderly, hard of hearing, well-nourished, no acute distress  Head: normocephalic, atraumatic Eyes: anicteric sclera ENT: oropharynx clear Neck: supple, nontender Lungs:  Clear throughout to auscultation.   No wheezes, crackles, or rhonchi. No acute distress. Heart:  Regular rate and rhythm; no murmurs, clicks, rubs,  or gallops. Abdomen: diffuse tenderness with guarding, soft, nondistended, +BS  Rectal:  Deferred Ext: no edema  GI:  Lab Results: Recent Labs    04/20/22 1441 04/20/22 1448  WBC 7.4  --   HGB 9.8* 8.2*  HCT 30.1* 24.0*  PLT 237  --    BMET Recent Labs    04/20/22 1441 04/20/22 1448  NA 134* 134*  K 5.0 5.0  CL 103 104  CO2 22  --   GLUCOSE 135* 131*  BUN 55* 47*  CREATININE 1.66* 1.90*  CALCIUM 8.3*  --    LFT Recent Labs    04/20/22 1441  PROT 5.8*  ALBUMIN 3.2*  AST 16  ALT 13  ALKPHOS 55  BILITOT 0.6  PT/INR Recent Labs    04/20/22 1441  LABPROT 17.5*  INR 1.5*     Studies/Results: No results  found.  Impression/Plan: Painless hematochezia likely diverticular in the setting of Eliquis. If bleeding persists, then needs a CT angiogram to further evaluate. Renal insufficiency. NPO. Supportive care. Would not recommend a colonoscopy unless CTA negative and bleeding ongoing with worsening anemia. Will f/u.    LOS: 0 days   [name]  04/20/2022, 3:51 PM  Questions please call [phone]

## 2022-04-20 NOTE — ED Triage Notes (Signed)
Pt BIBA from home with c/o rectal bleeding that started this morning, denies pain.  Pale, weakness, hx afib  HR 100-150 BP 100/60 18G LAC CBG 152 O2 100 RA

## 2022-04-20 NOTE — Procedures (Signed)
Pre-procedure Diagnosis: Acute upper GI bleeding Post-procedure Diagnosis: Same  Post mesenteric arteriogram and prophylactic coil embolization of active GI bleeding within the descending duodenum demonstrated on preceding CTA.    Complications: None Immediate EBL: None  Keep right leg straight for 4 hrs (until midnight).    Signed: Sandi Mariscal Pager: [phone] 04/20/2022, 8:12 PM

## 2022-04-20 NOTE — Progress Notes (Signed)
Addend  D/w Dr Pascal Lux IR and Dr. Michail Sermon GI Has Contrast in stomach liekly from bleed  D/w wife/family Patient wishes all interventions available and family confirm Full Code  He will go for embolization of GDA per Dr. Pascal Lux  He is stable at this time   CCM peripherally aware if he decompensates  Verneita Griffes, MD [facility] 6:40 PM  >35 min care coord

## 2022-04-21 DIAGNOSIS — T39395A Adverse effect of other nonsteroidal anti-inflammatory drugs [NSAID], initial encounter: Secondary | ICD-10-CM | POA: Diagnosis not present

## 2022-04-21 DIAGNOSIS — K922 Gastrointestinal hemorrhage, unspecified: Secondary | ICD-10-CM | POA: Diagnosis not present

## 2022-04-21 LAB — CBC
HCT: 27.7 % — ABNORMAL LOW (ref 39.0–52.0)
Hemoglobin: 8.9 g/dL — ABNORMAL LOW (ref 13.0–17.0)
MCH: 32.4 pg (ref 26.0–34.0)
MCHC: 32.1 g/dL (ref 30.0–36.0)
MCV: 100.7 fL — ABNORMAL HIGH (ref 80.0–100.0)
Platelets: 167 10*3/uL (ref 150–400)
RBC: 2.75 MIL/uL — ABNORMAL LOW (ref 4.22–5.81)
RDW: 15 % (ref 11.5–15.5)
WBC: 11 10*3/uL — ABNORMAL HIGH (ref 4.0–10.5)
nRBC: 0 % (ref 0.0–0.2)

## 2022-04-21 LAB — MAGNESIUM
Magnesium: 1.9 mg/dL (ref 1.7–2.4)
Magnesium: 1.9 mg/dL (ref 1.7–2.4)

## 2022-04-21 LAB — COMPREHENSIVE METABOLIC PANEL
ALT: 11 U/L (ref 0–44)
AST: 12 U/L — ABNORMAL LOW (ref 15–41)
Albumin: 2.5 g/dL — ABNORMAL LOW (ref 3.5–5.0)
Alkaline Phosphatase: 38 U/L (ref 38–126)
Anion gap: 7 (ref 5–15)
BUN: 47 mg/dL — ABNORMAL HIGH (ref 8–23)
CO2: 18 mmol/L — ABNORMAL LOW (ref 22–32)
Calcium: 7.6 mg/dL — ABNORMAL LOW (ref 8.9–10.3)
Chloride: 110 mmol/L (ref 98–111)
Creatinine, Ser: 1.34 mg/dL — ABNORMAL HIGH (ref 0.61–1.24)
GFR, Estimated: 49 mL/min — ABNORMAL LOW (ref >60)
Glucose, Bld: 125 mg/dL — ABNORMAL HIGH (ref 70–99)
Potassium: 4.6 mmol/L (ref 3.5–5.1)
Sodium: 135 mmol/L (ref 135–145)
Total Bilirubin: 0.9 mg/dL (ref 0.3–1.2)
Total Protein: 4.5 g/dL — ABNORMAL LOW (ref 6.5–8.1)

## 2022-04-21 MED ORDER — METOPROLOL TARTRATE 5 MG/5ML IV SOLN
2.5000 mg | Freq: Once | INTRAVENOUS | Status: AC
  Administered 2022-04-21: 2.5 mg via INTRAVENOUS
  Filled 2022-04-21: qty 5

## 2022-04-21 MED ORDER — AMIODARONE LOAD VIA INFUSION
150.0000 mg | Freq: Once | INTRAVENOUS | Status: DC
  Filled 2022-04-21: qty 83.34

## 2022-04-21 MED ORDER — LACTATED RINGERS IV BOLUS
250.0000 mL | Freq: Once | INTRAVENOUS | Status: AC
  Administered 2022-04-21: 250 mL via INTRAVENOUS

## 2022-04-21 MED ORDER — ONDANSETRON HCL 4 MG/2ML IJ SOLN: 4.0000 mg | Freq: Three times a day (TID) | INTRAMUSCULAR | Status: DC | PRN

## 2022-04-21 MED ORDER — ONDANSETRON HCL 4 MG/2ML IJ SOLN
INTRAMUSCULAR | Status: AC
  Administered 2022-04-21: 2 mg
  Filled 2022-04-21: qty 2

## 2022-04-21 MED ORDER — AMIODARONE HCL IN DEXTROSE 360-4.14 MG/200ML-% IV SOLN: 60.0000 mg/h | INTRAVENOUS | Status: DC

## 2022-04-21 MED ORDER — AMIODARONE HCL IN DEXTROSE 360-4.14 MG/200ML-% IV SOLN: 30.0000 mg/h | INTRAVENOUS | Status: DC

## 2022-04-21 MED ORDER — DILTIAZEM HCL 25 MG/5ML IV SOLN: 15.0000 mg | Freq: Once | INTRAVENOUS | Status: DC

## 2022-04-21 MED ORDER — METOPROLOL SUCCINATE ER 25 MG PO TB24
12.5000 mg | ORAL_TABLET | Freq: Every day | ORAL | Status: DC
  Administered 2022-04-21 – 2022-04-24 (×4): 12.5 mg via ORAL
  Filled 2022-04-21 (×4): qty 1

## 2022-04-21 MED ORDER — AMIODARONE HCL 200 MG PO TABS
400.0000 mg | ORAL_TABLET | Freq: Two times a day (BID) | ORAL | Status: DC
  Administered 2022-04-21 – 2022-04-22 (×4): 400 mg via ORAL
  Filled 2022-04-21 (×4): qty 2

## 2022-04-21 MED ORDER — CHLORHEXIDINE GLUCONATE CLOTH 2 % EX PADS
6.0000 | MEDICATED_PAD | Freq: Every day | CUTANEOUS | Status: DC
  Administered 2022-04-22: 6 via TOPICAL

## 2022-04-21 NOTE — Patient Instructions (Signed)
DUE TO COVID-19 ONLY TWO VISITORS  (aged 87 and older)  ARE ALLOWED TO COME WITH YOU AND STAY IN THE WAITING ROOM ONLY DURING PRE OP AND PROCEDURE.   **NO VISITORS ARE ALLOWED IN THE SHORT STAY AREA OR RECOVERY ROOM!!**  IF YOU WILL BE ADMITTED INTO THE HOSPITAL YOU ARE ALLOWED ONLY FOUR SUPPORT PEOPLE DURING VISITATION HOURS ONLY (7 AM -8PM)   The support person(s) must pass our screening, gel in and out, and wear a mask at all times, including in the patient's room. Patients must also wear a mask when staff or their support person are in the room. Visitors GUEST BADGE MUST BE WORN VISIBLY  One adult visitor may remain with you overnight and MUST be in the room by 8 P.M.     Your procedure is scheduled on: 05/03/22   Report to [facility] Main Entrance    Report to admitting at : 10:30 AM   Call this number if you have problems the morning of surgery [phone]   Do not eat food :After Midnight.   After Midnight you may have the following liquids until : 9:30 AM DAY OF SURGERY  Water Black Coffee (sugar ok, NO MILK/CREAM OR CREAMERS)  Tea (sugar ok, NO MILK/CREAM OR CREAMERS) regular and decaf                             Plain Jell-O (NO RED)                                           Fruit ices (not with fruit pulp, NO RED)                                     Popsicles (NO RED)                                                                  Juice: apple, WHITE grape, WHITE cranberry Sports drinks like Gatorade (NO RED)              Oral Hygiene is also important to reduce your risk of infection.                                    Remember - BRUSH YOUR TEETH THE MORNING OF SURGERY WITH YOUR REGULAR TOOTHPASTE  DENTURES WILL BE REMOVED PRIOR TO SURGERY PLEASE DO NOT APPLY "Poly grip" OR ADHESIVES!!!   Do NOT smoke after Midnight   Take these medicines the morning of surgery with A SIP OF WATER: metoprolol.  Bring CPAP mask and tubing day of surgery.                               You may not have any metal on your body including hair pins, jewelry, and body piercing             Do not wear  lotions, powders, perfumes/cologne, or deodorant              Men may shave face and neck.   Do not bring valuables to the hospital. [place].   Contacts, glasses, or bridgework may not be worn into surgery.   Bring small overnight bag day of surgery.   DO NOT [place]. PHARMACY WILL DISPENSE MEDICATIONS LISTED ON YOUR MEDICATION LIST TO YOU DURING YOUR ADMISSION [place]!    Patients discharged on the day of surgery will not be allowed to drive home.  Someone NEEDS to stay with you for the first 24 hours after anesthesia.   Special Instructions: Bring a copy of your healthcare power of attorney and living will documents         the day of surgery if you haven't scanned them before.              Please read over the following fact sheets you were given: IF YOU HAVE QUESTIONS ABOUT YOUR PRE-OP INSTRUCTIONS PLEASE CALL [phone]    [facility] - Preparing for Surgery Before surgery, you can play an important role.  Because skin is not sterile, your skin needs to be as free of germs as possible.  You can reduce the number of germs on your skin by washing with CHG (chlorahexidine gluconate) soap before surgery.  CHG is an antiseptic cleaner which kills germs and bonds with the skin to continue killing germs even after washing. Please DO NOT use if you have an allergy to CHG or antibacterial soaps.  If your skin becomes reddened/irritated stop using the CHG and inform your nurse when you arrive at Short Stay. Do not shave (including legs and underarms) for at least 48 hours prior to the first CHG shower.  You may shave your face/neck. Please follow these instructions carefully:  1.  Shower with CHG Soap the night before surgery and the  morning of Surgery.  2.  If you choose  to wash your hair, wash your hair first as usual with your  normal  shampoo.  3.  After you shampoo, rinse your hair and body thoroughly to remove the  shampoo.                           4.  Use CHG as you would any other liquid soap.  You can apply chg directly  to the skin and wash                       Gently with a scrungie or clean washcloth.  5.  Apply the CHG Soap to your body ONLY FROM THE NECK DOWN.   Do not use on face/ open                           Wound or open sores. Avoid contact with eyes, ears mouth and genitals (private parts).                       Wash face,  Genitals (private parts) with your normal soap.             6.  Wash thoroughly, paying special attention to the area where your surgery  will be performed.  7.  Thoroughly rinse your body with warm water from the neck down.  8.  DO NOT shower/wash with your normal soap after using and rinsing off  the CHG Soap.                9.  Pat yourself dry with a clean towel.            10.  Wear clean pajamas.            11.  Place clean sheets on your bed the night of your first shower and do not  sleep with pets. Day of Surgery : Do not apply any lotions/deodorants the morning of surgery.  Please wear clean clothes to the hospital/surgery center.  FAILURE TO FOLLOW THESE INSTRUCTIONS MAY RESULT IN THE CANCELLATION OF YOUR SURGERY PATIENT SIGNATURE_________________________________  NURSE SIGNATURE__________________________________  ________________________________________________________________________

## 2022-04-21 NOTE — Progress Notes (Signed)
[name] 89 y.o. 11/18/1925   Subjective: No bleeding overnight. Reports abdominal pain. Very hard of hearing. Family in room.  Objective: Vital signs: Vitals:   04/21/22 0715 04/21/22 0800  BP:    Pulse: (!) 103   Resp: 13   Temp:  98.2 F (36.8 C)  SpO2: 97%   BP 89/73  Physical Exam: GYI:RSWNIOEVO, elderly, thin, no acute distress, pleasant HEENT: anicteric sclera CV: RRR Chest: CTA B Abd: diffuse tenderness with guarding, soft, nondistended, +BS Ext: no edema  Lab Results: Recent Labs    04/20/22 1441 04/20/22 1448 04/21/22 0305 04/21/22 0845  NA 134* 134* 135  --   K 5.0 5.0 4.6  --   CL 103 104 110  --   CO2 22  --  18*  --   GLUCOSE 135* 131* 125*  --   BUN 55* 47* 47*  --   CREATININE 1.66* 1.90* 1.34*  --   CALCIUM 8.3*  --  7.6*  --   MG  --   --  1.9 1.9   Recent Labs    04/20/22 1441 04/21/22 0305  AST 16 12*  ALT 13 11  ALKPHOS 55 38  BILITOT 0.6 0.9  PROT 5.8* 4.5*  ALBUMIN 3.2* 2.5*   Recent Labs    04/20/22 1441 04/20/22 1448 04/21/22 0305  WBC 7.4  --  11.0*  NEUTROABS 6.5  --   --   HGB 9.8* 8.2* 8.9*  HCT 30.1* 24.0* 27.7*  MCV 100.0  --  100.7*  PLT 237  --  167      Assessment/Plan: Upper GI bleed - s/p embolization of GDA following CTA showing active bleeding in the second portion of the duodenum. Hgb stable at 8.9. Continue Protonix drip. NPO. Supportive care. Will follow.   Richard Reed 04/21/2022, 11:25 AM  Questions please call [phone] ID: Richard Reed, male   DOB: 1925/08/30, 89 y.o.   MRN: [id number]

## 2022-04-21 NOTE — Progress Notes (Signed)
PROGRESS NOTE   Richard Reed  DJM:[id number] DOB: 1925/08/03 DOA: 04/20/2022 PCP: [name], FNP  Brief Narrative:  89 year old white male known history of paroxysmal atrial fibrillation on anticoagulation diverticulosis previous CVA with right MCA aneurysm CKD 3 arthritis asbestosis COPD prior BPH--seen by [name] for kidney stones in 2012 and KUB 2023 Prior admission 12/01/2018 with GI bleed aspirin was held patient did not have colonoscopy   Recent surgery 04/07/2022 under [name] cystoscopy and bladder irrigation  Developed dark unformed stool 04/18/2022 Given Protonix Kcentra 2 units of blood Found to be in A-fib with RVR started on Cardizem  CTA showed active extravasation into the stomach   Hospital-Problem based course  GI bleed?  Diverticulosis - CTA showed active extravasation, IR performed mesenteric angiogram and embolization descending duodenum - Continue Protonix gtt. - Saline as below -N.p.o. except as per GI and their instructions   A-fib RVR from PTA on Eliquis - Continue Cardizem gtt. - Add amiodarone 400 twice daily load and check magnesium this morning - If pressure can tolerate we will add back metoprolol  AKI prior to admission superimposed on CKD 3 AA Mild hyperkalemia - Resolving with NS, cut rate from 125 to 75 cc/H   Previous CVA and cerebral aneurysm - Cannot give aspirin in current setting - Eliquis on hold and follow in outpatient setting   Asbestosis?  COPD - Not on inhalers seems stable  DVT prophylaxis: Contraindicated because of bleed Code Status: Confirmed full Family Communication: Discussed with family at bedside Disposition:  Status is: Inpatient Remains inpatient appropriate because:   GI bleed A-fib RVR    Subjective: Presbycusis limits history but no reported bleeding Looks well  Objective: Vitals:   04/21/22 0500 04/21/22 0600 04/21/22 0645 04/21/22 0715  BP: 106/74 101/69 (!) 89/73   Pulse: 69 (!) 117  (!) 49 (!) 103  Resp: 15 (!) 22 (!) 23 13  Temp:      TempSrc:      SpO2: 98% 97% 99% 97%  Weight:      Height:        Intake/Output Summary (Last 24 hours) at 04/21/2022 0818 Last data filed at 04/21/2022 0600 Gross per 24 hour  Intake 2187.02 ml  Output 1700 ml  Net 487.02 ml   Filed Weights   04/20/22 1505 04/20/22 2100  Weight: 64.4 kg 65 kg    Examination:  EOMI NCAT no focal deficit no icterus no pallor Chest clear no added sound ROM intact moving all 4 limbs equally Abdomen is soft no rebound no guarding Neuro is grossly intact Psych euthymic Very hard of hearing  Data Reviewed: personally reviewed   CBC    Component Value Date/Time   WBC 11.0 (H) 04/21/2022 0305   RBC 2.75 (L) 04/21/2022 0305   HGB 8.9 (L) 04/21/2022 0305   HGB 15.0 07/12/2019 1224   HCT 27.7 (L) 04/21/2022 0305   HCT 43.7 07/12/2019 1224   PLT 167 04/21/2022 0305   PLT 122 (L) 07/12/2019 1224   MCV 100.7 (H) 04/21/2022 0305   MCV 95 07/12/2019 1224   MCH 32.4 04/21/2022 0305   MCHC 32.1 04/21/2022 0305   RDW 15.0 04/21/2022 0305   RDW 13.3 07/12/2019 1224   LYMPHSABS 0.5 (L) 04/20/2022 1441   MONOABS 0.4 04/20/2022 1441   EOSABS 0.0 04/20/2022 1441   BASOSABS 0.0 04/20/2022 1441      Latest Ref Rng & Units 04/21/2022    3:05 AM 04/20/2022  2:48 PM 04/20/2022    2:41 PM  CMP  Glucose 70 - 99 mg/dL 125  131  135   BUN 8 - 23 mg/dL 47  47  55   Creatinine 0.61 - 1.24 mg/dL 1.34  1.90  1.66   Sodium 135 - 145 mmol/L 135  134  134   Potassium 3.5 - 5.1 mmol/L 4.6  5.0  5.0   Chloride 98 - 111 mmol/L 110  104  103   CO2 22 - 32 mmol/L 18   22   Calcium 8.9 - 10.3 mg/dL 7.6   8.3   Total Protein 6.5 - 8.1 g/dL 4.5   5.8   Total Bilirubin 0.3 - 1.2 mg/dL 0.9   0.6   Alkaline Phos 38 - 126 U/L 38   55   AST 15 - 41 U/L 12   16   ALT 0 - 44 U/L 11   13      Radiology Studies: CT ANGIO GI BLEED  Result Date: 04/20/2022 CLINICAL DATA:  Rectal bleeding. EXAM: CTA ABDOMEN AND PELVIS  WITHOUT AND WITH CONTRAST TECHNIQUE: Multidetector CT imaging of the abdomen and pelvis was performed using the standard protocol during bolus administration of intravenous contrast. Multiplanar reconstructed images and MIPs were obtained and reviewed to evaluate the vascular anatomy. RADIATION DOSE REDUCTION: This exam was performed according to the departmental dose-optimization program which includes automated exposure control, adjustment of the mA and/or kV according to patient size and/or use of iterative reconstruction technique. CONTRAST:  71m OMNIPAQUE IOHEXOL 350 MG/ML SOLN COMPARISON:  April 06, 2022. FINDINGS: VASCULAR Aorta: Atherosclerosis of abdominal aorta without aneurysm or dissection. Celiac: Patent without evidence of aneurysm, dissection, vasculitis or significant stenosis. SMA: Patent without evidence of aneurysm, dissection, vasculitis or significant stenosis. Renals: Bilateral renal arteries are patent without evidence of aneurysm, dissection, vasculitis, fibromuscular dysplasia or significant stenosis. IMA: Patent without evidence of aneurysm, dissection, vasculitis or significant stenosis. Inflow: Patent without evidence of aneurysm, dissection, vasculitis or significant stenosis. Proximal Outflow: Bilateral common femoral and visualized portions of the superficial and profunda femoral arteries are patent without evidence of aneurysm, dissection, vasculitis or significant stenosis. Veins: No obvious venous abnormality within the limitations of this arterial phase study. Review of the MIP images confirms the above findings. NON-VASCULAR Lower chest: No acute abnormality seen in visualized lung bases. Moderate sized hiatal hernia is noted. Hepatobiliary: No focal liver abnormality is seen. Status post cholecystectomy. No biliary dilatation. Pancreas: Unremarkable. No pancreatic ductal dilatation or surrounding inflammatory changes. Spleen: Normal in size without focal abnormality.  Adrenals/Urinary Tract: Adrenal glands appear normal. Bilateral renal cortical scarring is noted. Stable bilateral renal cysts are noted. Bilateral ureteral stents are noted in grossly good position. No hydronephrosis or renal obstruction is noted. Urinary bladder is decompressed. Stomach/Bowel: There appears to be active contrast extravasation involving the second portion the duodenum suggesting bleeding. There is no evidence of bowel obstruction. Extensive diverticulosis of sigmoid colon is noted without inflammation. Lymphatic: No definite adenopathy is noted. Reproductive: Status post prostatic brachytherapy seed placement. Other: No abdominal wall hernia or abnormality. No abdominopelvic ascites. Musculoskeletal: No acute or significant osseous findings. IMPRESSION: VASCULAR Small amount of focal contrast accumulation is noted in the second portion of the duodenum suggesting gastrointestinal bleeding. Critical Value/emergent results were called by telephone at the time of interpretation on 04/20/2022 at 5:30 pm to provider Dr. KTomi Bamberger who verbally acknowledged these results. Aortic Atherosclerosis (ICD10-I70.0). NON-VASCULAR Moderate size hiatal hernia. Sigmoid diverticulosis without inflammation. Bilateral  ureteral stents are noted in grossly good position. No hydronephrosis or renal obstruction is noted. Bilateral renal cortical atrophy is noted. Status post prostatic brachytherapy seed placement. Electronically Signed   By: Marijo Conception M.D.   On: 04/20/2022 17:30     Scheduled Meds:  sodium chloride   Intravenous Once   amiodarone  400 mg Oral BID   Chlorhexidine Gluconate Cloth  6 each Topical Daily   finasteride  5 mg Oral QHS   tamsulosin  0.4 mg Oral Daily   Continuous Infusions:  sodium chloride Stopped (04/20/22 1922)   diltiazem (CARDIZEM) infusion 15 mg/hr (04/21/22 0600)   pantoprazole 8 mg/hr (04/21/22 0600)     LOS: 1 day   Time spent: [place], MD [facility] To contact the attending provider between 7A-7P or the covering provider during after hours 7P-7A, please log into the web site [email or web address] and access using universal [place] password for that web site. If you do not have the password, please call the hospital operator.  04/21/2022, 8:18 AM

## 2022-04-21 NOTE — Plan of Care (Signed)
Discussed with patient plan of care for the evening, pain management and obtaining bedtime medications earlier with some teach back displayed.  Patient wanted bath in the morning.  Problem: Education: Goal: Understanding of CV disease, CV risk reduction, and recovery process will improve Outcome: Progressing

## 2022-04-21 NOTE — Progress Notes (Addendum)
       CROSS COVER NOTE  NAME: Richard Reed MRN: [id number] DOB : [birth year]    Date of Service   04/21/2022   89 year old male with PMH of PAF on anticoagulation, diverticulosis, previous CVA with right MCA aneurysm, CKD 3, COPD.  Patient presented to ED for GI bleeding.  Patient was started on Protonix, Kcentra, blood transfusion, IVF, and Cardizem gtt.   HPI/Events of Note   Notified by bedside RN of patient maxed on Cardizem gtt. with HR ~140-150s.  EKG confirms A-fib RVR.  Patient remains alert and oriented x 4.  Asymptomatic at this time.  BP 106/57 (73).  IVF noted to be turned off at this time.  Cardizem gtt. at 15 mg/h.  2.5 mg IV Lopressor and 250 cc bolus ordered at this time.   Interventions/ Plan   IV Lopressor LR bolus       Raenette Rover, DNP, [place]

## 2022-04-21 NOTE — TOC Initial Note (Signed)
Transition of Care [facility]) - Initial/Assessment Note    Patient Details  Name: Richard Reed MRN: [id number] Date of Birth: [birth year]  Transition of Care [facility]) CM/SW Contact:    Leeroy Cha, RN Phone Number: 04/21/2022, 7:40 AM  Clinical Narrative:                  Transition of Care [facility]) Screening Note   Patient Details  Name: Richard Reed Date of Birth: [birth year]   Transition of Care [facility]) CM/SW Contact:    Leeroy Cha, RN Phone Number: 04/21/2022, 7:40 AM    Transition of Care Department [facility]) has reviewed patient and no TOC needs have been identified at this time. We will continue to monitor patient advancement through interdisciplinary progression rounds. If new patient transition needs arise, please place a TOC consult.    Expected Discharge Plan: Home/Self Care Barriers to Discharge: Continued Medical Work up   Patient Goals and CMS Choice Patient states their goals for this hospitalization and ongoing recovery are:: not stated   Choice offered to / list presented to : Spouse      Expected Discharge Plan and Services   Discharge Planning Services: CM Consult   Living arrangements for the past 2 months: Single Family Home                                      Prior Living Arrangements/Services Living arrangements for the past 2 months: Single Family Home Lives with:: Spouse Patient language and need for interpreter reviewed:: Yes Do you feel safe going back to the place where you live?: Yes            Criminal Activity/Legal Involvement Pertinent to Current Situation/Hospitalization: No - Comment as needed  Activities of Daily Living      Permission Sought/Granted                  Emotional Assessment Appearance:: Appears stated age Attitude/Demeanor/Rapport: Gracious Affect (typically observed): Appropriate Orientation: : Oriented to Self, Oriented to Place, Oriented to Situation Alcohol / Substance Use: Tobacco  Use (former smoker low risk) Psych Involvement: No (comment)  Admission diagnosis:  Rectal bleeding [K62.5] Atrial fibrillation with RVR ([place]) [I48.91] GI bleed due to NSAIDs [K92.2, T39.395A] Patient Active Problem List   Diagnosis Date Noted   GI bleed due to NSAIDs 04/20/2022   Chronic anticoagulation 06/09/2020   History of stroke 06/09/2020   Left ventricular dysfunction 09/10/2019   Rectal bleed 12/01/2018   Asymptomatic PVCs 05/22/2017   Vertebrobasilar insufficiency 04/28/2016   Coronary artery disease due to lipid rich plaque 04/26/2016   Calcified pleural plaque due to asbestos exposure 03/31/2016   Syncope 02/29/2016   LOC (loss of consciousness) ([place]) 02/28/2016   UTI (urinary tract infection) 02/28/2016   Hemiparesis and alteration of sensations as late effects of stroke ([place]) 04/29/2015   TIA (transient ischemic attack) 12/02/2014   Visual disturbance 03/19/2014   Diplopia 03/19/2014   Essential hypertension 04/30/2013   Dyslipidemia 04/30/2013   PAF (paroxysmal atrial fibrillation) ([place]) 10/29/2012   Aortic valve insufficiency, moderate 10/29/2012   Heme positive stool 75/91/6384   Biliary colic 66/59/9357   Prostate CA ([place]) 04/13/2011   Thrombocytopenia ([place]) 03/24/2011   Encephalopathy 01/77/9390   Metabolic acidosis 30/12/2328   Acute on chronic renal failure ([place]) 03/23/2011   Hypotension 03/23/2011   Hard of hearing 03/23/2011   Chronic renal  insufficiency, stage III (moderate) ([place]) 03/23/2011   PCP:  [name], FNP Pharmacy:   [place] [address] Phone: [phone] Fax: [phone]     Social Determinants of Health (SDOH) Social History: SDOH Screenings   Depression (PHQ2-9): Low Risk  (02/09/2019)  Tobacco Use: Low Risk  (04/20/2022)   SDOH Interventions:     Readmission Risk Interventions   No data to display

## 2022-04-22 ENCOUNTER — Encounter (HOSPITAL_COMMUNITY)
Admission: RE | Admit: 2022-04-22 | Discharge: 2022-04-22 | Disposition: A | Discharge status: Home / Self Care | Payer: Medicare Other | Source: Ambulatory Visit | Attending: Cardiology | Admitting: Cardiology

## 2022-04-22 ENCOUNTER — Ambulatory Visit: Payer: Medicare Other

## 2022-04-22 ENCOUNTER — Inpatient Hospital Stay (HOSPITAL_COMMUNITY): Payer: Medicare Other

## 2022-04-22 DIAGNOSIS — T39395A Adverse effect of other nonsteroidal anti-inflammatory drugs [NSAID], initial encounter: Secondary | ICD-10-CM | POA: Diagnosis not present

## 2022-04-22 DIAGNOSIS — K922 Gastrointestinal hemorrhage, unspecified: Secondary | ICD-10-CM | POA: Diagnosis not present

## 2022-04-22 DIAGNOSIS — I10 Essential (primary) hypertension: Secondary | ICD-10-CM

## 2022-04-22 LAB — MAGNESIUM: Magnesium: 1.9 mg/dL (ref 1.7–2.4)

## 2022-04-22 LAB — LACTIC ACID, PLASMA: Lactic Acid, Venous: 0.8 mmol/L (ref 0.5–1.9)

## 2022-04-22 LAB — HEMOGLOBIN AND HEMATOCRIT, BLOOD
HCT: 30.3 % — ABNORMAL LOW (ref 39.0–52.0)
Hemoglobin: 10.1 g/dL — ABNORMAL LOW (ref 13.0–17.0)

## 2022-04-22 LAB — CBC
HCT: 23.8 % — ABNORMAL LOW (ref 39.0–52.0)
HCT: 17.4 % — ABNORMAL LOW (ref 39.0–52.0)
Hemoglobin: 7.6 g/dL — ABNORMAL LOW (ref 13.0–17.0)
Hemoglobin: 5.4 g/dL — CL (ref 13.0–17.0)
MCH: 31.9 pg (ref 26.0–34.0)
MCH: 32.7 pg (ref 26.0–34.0)
MCHC: 31.9 g/dL (ref 30.0–36.0)
MCHC: 31 g/dL (ref 30.0–36.0)
MCV: 100 fL (ref 80.0–100.0)
MCV: 105.5 fL — ABNORMAL HIGH (ref 80.0–100.0)
Platelets: 157 10*3/uL (ref 150–400)
Platelets: 105 10*3/uL — ABNORMAL LOW (ref 150–400)
RBC: 2.38 MIL/uL — ABNORMAL LOW (ref 4.22–5.81)
RBC: 1.65 MIL/uL — ABNORMAL LOW (ref 4.22–5.81)
RDW: 15.7 % — ABNORMAL HIGH (ref 11.5–15.5)
RDW: 15.9 % — ABNORMAL HIGH (ref 11.5–15.5)
WBC: 5.9 10*3/uL (ref 4.0–10.5)
WBC: 4.5 10*3/uL (ref 4.0–10.5)
nRBC: 0 % (ref 0.0–0.2)
nRBC: 0.4 % — ABNORMAL HIGH (ref 0.0–0.2)

## 2022-04-22 LAB — COMPREHENSIVE METABOLIC PANEL
ALT: 12 U/L (ref 0–44)
AST: 13 U/L — ABNORMAL LOW (ref 15–41)
Albumin: 2.7 g/dL — ABNORMAL LOW (ref 3.5–5.0)
Alkaline Phosphatase: 41 U/L (ref 38–126)
Anion gap: 6 (ref 5–15)
BUN: 35 mg/dL — ABNORMAL HIGH (ref 8–23)
CO2: 19 mmol/L — ABNORMAL LOW (ref 22–32)
Calcium: 7.7 mg/dL — ABNORMAL LOW (ref 8.9–10.3)
Chloride: 112 mmol/L — ABNORMAL HIGH (ref 98–111)
Creatinine, Ser: 1.32 mg/dL — ABNORMAL HIGH (ref 0.61–1.24)
GFR, Estimated: 50 mL/min — ABNORMAL LOW (ref >60)
Glucose, Bld: 120 mg/dL — ABNORMAL HIGH (ref 70–99)
Potassium: 4.1 mmol/L (ref 3.5–5.1)
Sodium: 137 mmol/L (ref 135–145)
Total Bilirubin: 0.8 mg/dL (ref 0.3–1.2)
Total Protein: 4.8 g/dL — ABNORMAL LOW (ref 6.5–8.1)

## 2022-04-22 LAB — MRSA NEXT GEN BY PCR, NASAL: MRSA by PCR Next Gen: DETECTED — AB

## 2022-04-22 LAB — PREPARE RBC (CROSSMATCH)

## 2022-04-22 MED ORDER — MUPIROCIN 2 % EX OINT
1.0000 | TOPICAL_OINTMENT | Freq: Two times a day (BID) | CUTANEOUS | Status: DC
  Administered 2022-04-22 – 2022-04-26 (×9): 1 via NASAL
  Filled 2022-04-22 (×2): qty 22

## 2022-04-22 MED ORDER — FUROSEMIDE 10 MG/ML IJ SOLN
20.0000 mg | Freq: Once | INTRAMUSCULAR | Status: AC
  Administered 2022-04-22: 20 mg via INTRAVENOUS
  Filled 2022-04-22: qty 2

## 2022-04-22 MED ORDER — IOHEXOL 350 MG/ML SOLN
100.0000 mL | Freq: Once | INTRAVENOUS | Status: AC | PRN
  Administered 2022-04-22: 100 mL via INTRAVENOUS

## 2022-04-22 MED ORDER — SODIUM CHLORIDE 0.9% IV SOLUTION: Freq: Once | INTRAVENOUS | Status: AC

## 2022-04-22 MED ORDER — DILTIAZEM HCL ER COATED BEADS 180 MG PO CP24
180.0000 mg | ORAL_CAPSULE | Freq: Every day | ORAL | Status: DC
  Administered 2022-04-23 – 2022-04-26 (×4): 180 mg via ORAL
  Filled 2022-04-22 (×4): qty 1

## 2022-04-22 MED ORDER — ACETAMINOPHEN 325 MG PO TABS
650.0000 mg | ORAL_TABLET | Freq: Once | ORAL | Status: AC
  Administered 2022-04-22: 650 mg via ORAL
  Filled 2022-04-22: qty 2

## 2022-04-22 MED ORDER — DIPHENHYDRAMINE HCL 25 MG PO CAPS
25.0000 mg | ORAL_CAPSULE | Freq: Once | ORAL | Status: AC
  Administered 2022-04-22: 25 mg via ORAL
  Filled 2022-04-22: qty 1

## 2022-04-22 MED ORDER — DILTIAZEM HCL ER COATED BEADS 120 MG PO CP24
240.0000 mg | ORAL_CAPSULE | Freq: Every day | ORAL | Status: DC
  Administered 2022-04-22: 240 mg via ORAL
  Filled 2022-04-22: qty 2

## 2022-04-22 MED ORDER — SODIUM CHLORIDE (PF) 0.9 % IJ SOLN
INTRAMUSCULAR | Status: AC
  Filled 2022-04-22: qty 50

## 2022-04-22 MED ORDER — CHLORHEXIDINE GLUCONATE CLOTH 2 % EX PADS
6.0000 | MEDICATED_PAD | Freq: Every day | CUTANEOUS | Status: DC
  Administered 2022-04-23 – 2022-04-26 (×4): 6 via TOPICAL

## 2022-04-22 NOTE — Progress Notes (Signed)
PROGRESS NOTE   Richard Reed  VPX:[id number] DOB: May 13, 1925 DOA: 04/20/2022 PCP: [name], FNP  Brief Narrative:  89 year old white male known history of paroxysmal atrial fibrillation on anticoagulation diverticulosis previous CVA with right MCA aneurysm CKD 3 arthritis asbestosis COPD prior BPH--seen by [name] for kidney stones in 2012 and KUB 2023 Prior admission 12/01/2018 with GI bleed aspirin was held patient did not have colonoscopy   Recent surgery 04/07/2022 under [name] cystoscopy and bladder irrigation  Developed dark unformed stool 04/18/2022 Given Protonix Kcentra 2 units of blood Found to be in A-fib with RVR started on Cardizem  CTA showed active extravasation into the stomach   Hospital-Problem based course  GI bleed?  Diverticulosis - CTA showed active extravasation, IR performed mesenteric angiogram and embolization descending duodenum - Continue Protonix gtt. as per GI - Saline as below - Will start on only clear liquids   Anemia of acute blood loss - Transfuse 1 unit of blood 1/3 - Further bleeding overnight loose dark BM 1/4, 1/5 therefore hold diet - Has not met transfusion criteria and is yet but monitor  A-fib RVR from PTA on Eliquis - RVR is resolved -Transitioning Cardizem to 240 XL dosing - Continue amiodarone load 400 twice daily and switch to 400 daily - Added back Toprol-XL 12.5 on 1/4 PM  AKI prior to admission superimposed on CKD 3 AA Mild hyperkalemia - Resolving with NS, cut rate from 125 to 75 cc/H - Keep on maintenance secondary to n.p.o. state   Previous CVA and cerebral aneurysm - Cannot give aspirin in current setting - Eliquis on hold and follow in outpatient setting   BPH - Continue Proscar 5 and Flomax 0.4 -Voiding trial today and remove Foley if able  Asbestosis?  COPD - Not on inhalers seems stable  DVT prophylaxis: Contraindicated because of bleed Code Status: Confirmed full Family Communication:  Discussed with family at bedside Disposition:  Status is: Inpatient Remains inpatient appropriate because:   GI bleed A-fib RVR    Subjective:  1 dark stool overnight-hemoglobin has dropped He seems comfortable overall RVR is resolved  Objective: Vitals:   04/22/22 0654 04/22/22 0655 04/22/22 0700 04/22/22 0800  BP: 94/64  (!) 104/58 (!) 95/45  Pulse: 87 83    Resp: (!) '24 19 17 11  '$ Temp:      TempSrc:      SpO2: 96% 95% 96% 94%  Weight:      Height:        Intake/Output Summary (Last 24 hours) at 04/22/2022 0903 Last data filed at 04/22/2022 0842 Gross per 24 hour  Intake 2603.63 ml  Output 1330 ml  Net 1273.63 ml    Filed Weights   04/20/22 1505 04/20/22 2100  Weight: 64.4 kg 65 kg    Examination:  Pleasant coherent no distress Chest is clear no wheeze rales rhonchi S1-S2 no murmur seems to be in sinus Abdomen soft no rebound ROM intact Psych euthymic  Data Reviewed: personally reviewed   CBC    Component Value Date/Time   WBC 5.9 04/22/2022 0540   RBC 2.38 (L) 04/22/2022 0540   HGB 7.6 (L) 04/22/2022 0540   HGB 15.0 07/12/2019 1224   HCT 23.8 (L) 04/22/2022 0540   HCT 43.7 07/12/2019 1224   PLT 157 04/22/2022 0540   PLT 122 (L) 07/12/2019 1224   MCV 100.0 04/22/2022 0540   MCV 95 07/12/2019 1224   MCH 31.9 04/22/2022 0540   MCHC 31.9 04/22/2022 0540  RDW 15.7 (H) 04/22/2022 0540   RDW 13.3 07/12/2019 1224   LYMPHSABS 0.5 (L) 04/20/2022 1441   MONOABS 0.4 04/20/2022 1441   EOSABS 0.0 04/20/2022 1441   BASOSABS 0.0 04/20/2022 1441      Latest Ref Rng & Units 04/22/2022    5:40 AM 04/21/2022    3:05 AM 04/20/2022    2:48 PM  CMP  Glucose 70 - 99 mg/dL 120  125  131   BUN 8 - 23 mg/dL 35  47  47   Creatinine 0.61 - 1.24 mg/dL 1.32  1.34  1.90   Sodium 135 - 145 mmol/L 137  135  134   Potassium 3.5 - 5.1 mmol/L 4.1  4.6  5.0   Chloride 98 - 111 mmol/L 112  110  104   CO2 22 - 32 mmol/L 19  18    Calcium 8.9 - 10.3 mg/dL 7.7  7.6    Total  Protein 6.5 - 8.1 g/dL 4.8  4.5    Total Bilirubin 0.3 - 1.2 mg/dL 0.8  0.9    Alkaline Phos 38 - 126 U/L 41  38    AST 15 - 41 U/L 13  12    ALT 0 - 44 U/L 12  11       Radiology Studies: IR EMBO ART  VEN HEMORR LYMPH EXTRAV  INC GUIDE ROADMAPPING  Result Date: 04/21/2022 INDICATION: Acute GI bleeding. Preceding CTA demonstrate extravasation of contrast into the descending portion of the duodenum. Given findings on preceding CTA as well as poor endoscopy candidacy due to advanced age and comorbidities, please perform mesenteric arteriogram and potential percutaneous embolization. EXAM: 1. ULTRASOUND GUIDANCE FOR ARTERIAL ACCESS 2. SELECTIVE CELIAC ARTERIOGRAM 3. SELECTIVE COMMON HEPATIC ARTERIOGRAM 4. SELECTIVE GASTRODUODENAL ARTERIOGRAM AND PERCUTANEOUS COIL EMBOLIZATION COMPARISON:  CT abdomen pelvis-04/20/2022 MEDICATIONS: None ANESTHESIA/SEDATION: None CONTRAST:  75 cc Omnipaque 300 FLUOROSCOPY TIME:  13 minutes, 18 seconds (124 mGy) COMPLICATIONS: None immediate. PROCEDURE: Informed consent was obtained from the patient, the patient's wife and daughter following explanation of the procedure, risks, benefits and alternatives. All questions were addressed. A time out was performed prior to the initiation of the procedure. Maximal barrier sterile technique utilized including caps, mask, sterile gowns, sterile gloves, large sterile drape, hand hygiene, and Betadine prep. The right femoral head was marked fluoroscopically. Under sterile conditions and local anesthesia, the right common femoral artery access was performed with a micropuncture needle. Under direct ultrasound guidance, the right common femoral was accessed with a micropuncture kit. An ultrasound image was saved for documentation purposes. This allowed for placement of a 5-French vascular sheath. A limited arteriogram was performed through the side arm of the sheath confirming appropriate access within the right common femoral artery. Over a  Bentson wire, a Mickelson catheter was advanced the caudal aspect of the thoracic aorta where was reformed, back bled and flushed. The Mickelson catheter was then utilized to select the celiac artery and a selective celiac arteriogram was performed With the use of a fathom 14 microwire, a regular Renegade microcatheter was utilized to select the gastroduodenal artery and a selective gastroduodenal arteriogram was performed Next, the GDA was percutaneously coil embolized with multiple overlapping 3 mm, 4 mm, 5 mm and 6 mm interlock coils two near the vessel's origin. Completion selective common hepatic arteriogram demonstrates no residual flow through the GDA. At this point, the procedure was terminated. All wires, catheters and sheaths were removed from the patient. Hemostasis was achieved at the right groin access  site with manual compression. The patient tolerated the procedure well without immediate post procedural complication. FINDINGS: Selective celiac arteriogram demonstrates the common hepatic artery ending in a bifurcation of the GDA and left hepatic artery with a replaced right hepatic artery noted to arise from the proximal SMA on preceding CTA. There is mild irregularity and vasospasm of the GDA without discrete area of contrast extravasation. Given location of active extravasation with the descending portion of the duodenum on preceding CTA, prophylactic percutaneous coil embolization of the GDA was performed Post embolization common hepatic arteriogram demonstrates no residual flow through the GDA vascular territory. IMPRESSION: Technically successful prophylactic percutaneous coil embolization of the GDA for active extravasation within the descending portion of the duodenum demonstrated on preceding CTA. PLAN: - The patient is to remain flat for 4 hours with right leg straight. - The patient will continue to experience several additional bloody bowel movements and may continue to require additional  resuscitation (as he was bleeding both before and during the procedure), however ultimately I am hopeful he will stabilize in the coming days. - While the patient's bleeding is presumably secondary to the presence of a duodenal ulcer, further evaluation with endoscopy could be performed as indicated. Electronically Signed   By: Sandi Mariscal M.D.   On: 04/21/2022 10:46   IR Angiogram Visceral Selective  Result Date: 04/21/2022 INDICATION: Acute GI bleeding. Preceding CTA demonstrate extravasation of contrast into the descending portion of the duodenum. Given findings on preceding CTA as well as poor endoscopy candidacy due to advanced age and comorbidities, please perform mesenteric arteriogram and potential percutaneous embolization. EXAM: 1. ULTRASOUND GUIDANCE FOR ARTERIAL ACCESS 2. SELECTIVE CELIAC ARTERIOGRAM 3. SELECTIVE COMMON HEPATIC ARTERIOGRAM 4. SELECTIVE GASTRODUODENAL ARTERIOGRAM AND PERCUTANEOUS COIL EMBOLIZATION COMPARISON:  CT abdomen pelvis-04/20/2022 MEDICATIONS: None ANESTHESIA/SEDATION: None CONTRAST:  75 cc Omnipaque 300 FLUOROSCOPY TIME:  13 minutes, 18 seconds (222 mGy) COMPLICATIONS: None immediate. PROCEDURE: Informed consent was obtained from the patient, the patient's wife and daughter following explanation of the procedure, risks, benefits and alternatives. All questions were addressed. A time out was performed prior to the initiation of the procedure. Maximal barrier sterile technique utilized including caps, mask, sterile gowns, sterile gloves, large sterile drape, hand hygiene, and Betadine prep. The right femoral head was marked fluoroscopically. Under sterile conditions and local anesthesia, the right common femoral artery access was performed with a micropuncture needle. Under direct ultrasound guidance, the right common femoral was accessed with a micropuncture kit. An ultrasound image was saved for documentation purposes. This allowed for placement of a 5-French vascular sheath.  A limited arteriogram was performed through the side arm of the sheath confirming appropriate access within the right common femoral artery. Over a Bentson wire, a Mickelson catheter was advanced the caudal aspect of the thoracic aorta where was reformed, back bled and flushed. The Mickelson catheter was then utilized to select the celiac artery and a selective celiac arteriogram was performed With the use of a fathom 14 microwire, a regular Renegade microcatheter was utilized to select the gastroduodenal artery and a selective gastroduodenal arteriogram was performed Next, the GDA was percutaneously coil embolized with multiple overlapping 3 mm, 4 mm, 5 mm and 6 mm interlock coils two near the vessel's origin. Completion selective common hepatic arteriogram demonstrates no residual flow through the GDA. At this point, the procedure was terminated. All wires, catheters and sheaths were removed from the patient. Hemostasis was achieved at the right groin access site with manual compression. The patient tolerated the  procedure well without immediate post procedural complication. FINDINGS: Selective celiac arteriogram demonstrates the common hepatic artery ending in a bifurcation of the GDA and left hepatic artery with a replaced right hepatic artery noted to arise from the proximal SMA on preceding CTA. There is mild irregularity and vasospasm of the GDA without discrete area of contrast extravasation. Given location of active extravasation with the descending portion of the duodenum on preceding CTA, prophylactic percutaneous coil embolization of the GDA was performed Post embolization common hepatic arteriogram demonstrates no residual flow through the GDA vascular territory. IMPRESSION: Technically successful prophylactic percutaneous coil embolization of the GDA for active extravasation within the descending portion of the duodenum demonstrated on preceding CTA. PLAN: - The patient is to remain flat for 4 hours  with right leg straight. - The patient will continue to experience several additional bloody bowel movements and may continue to require additional resuscitation (as he was bleeding both before and during the procedure), however ultimately I am hopeful he will stabilize in the coming days. - While the patient's bleeding is presumably secondary to the presence of a duodenal ulcer, further evaluation with endoscopy could be performed as indicated. Electronically Signed   By: Sandi Mariscal M.D.   On: 04/21/2022 10:22   IR US Guide Vasc Access Right  Result Date: 04/21/2022 INDICATION: Acute GI bleeding. Preceding CTA demonstrate extravasation of contrast into the descending portion of the duodenum. Given findings on preceding CTA as well as poor endoscopy candidacy due to advanced age and comorbidities, please perform mesenteric arteriogram and potential percutaneous embolization. EXAM: 1. ULTRASOUND GUIDANCE FOR ARTERIAL ACCESS 2. SELECTIVE CELIAC ARTERIOGRAM 3. SELECTIVE COMMON HEPATIC ARTERIOGRAM 4. SELECTIVE GASTRODUODENAL ARTERIOGRAM AND PERCUTANEOUS COIL EMBOLIZATION COMPARISON:  CT abdomen pelvis-04/20/2022 MEDICATIONS: None ANESTHESIA/SEDATION: None CONTRAST:  75 cc Omnipaque 300 FLUOROSCOPY TIME:  13 minutes, 18 seconds (371 mGy) COMPLICATIONS: None immediate. PROCEDURE: Informed consent was obtained from the patient, the patient's wife and daughter following explanation of the procedure, risks, benefits and alternatives. All questions were addressed. A time out was performed prior to the initiation of the procedure. Maximal barrier sterile technique utilized including caps, mask, sterile gowns, sterile gloves, large sterile drape, hand hygiene, and Betadine prep. The right femoral head was marked fluoroscopically. Under sterile conditions and local anesthesia, the right common femoral artery access was performed with a micropuncture needle. Under direct ultrasound guidance, the right common femoral was  accessed with a micropuncture kit. An ultrasound image was saved for documentation purposes. This allowed for placement of a 5-French vascular sheath. A limited arteriogram was performed through the side arm of the sheath confirming appropriate access within the right common femoral artery. Over a Bentson wire, a Mickelson catheter was advanced the caudal aspect of the thoracic aorta where was reformed, back bled and flushed. The Mickelson catheter was then utilized to select the celiac artery and a selective celiac arteriogram was performed With the use of a fathom 14 microwire, a regular Renegade microcatheter was utilized to select the gastroduodenal artery and a selective gastroduodenal arteriogram was performed Next, the GDA was percutaneously coil embolized with multiple overlapping 3 mm, 4 mm, 5 mm and 6 mm interlock coils two near the vessel's origin. Completion selective common hepatic arteriogram demonstrates no residual flow through the GDA. At this point, the procedure was terminated. All wires, catheters and sheaths were removed from the patient. Hemostasis was achieved at the right groin access site with manual compression. The patient tolerated the procedure well without immediate post procedural  complication. FINDINGS: Selective celiac arteriogram demonstrates the common hepatic artery ending in a bifurcation of the GDA and left hepatic artery with a replaced right hepatic artery noted to arise from the proximal SMA on preceding CTA. There is mild irregularity and vasospasm of the GDA without discrete area of contrast extravasation. Given location of active extravasation with the descending portion of the duodenum on preceding CTA, prophylactic percutaneous coil embolization of the GDA was performed Post embolization common hepatic arteriogram demonstrates no residual flow through the GDA vascular territory. IMPRESSION: Technically successful prophylactic percutaneous coil embolization of the GDA for  active extravasation within the descending portion of the duodenum demonstrated on preceding CTA. PLAN: - The patient is to remain flat for 4 hours with right leg straight. - The patient will continue to experience several additional bloody bowel movements and may continue to require additional resuscitation (as he was bleeding both before and during the procedure), however ultimately I am hopeful he will stabilize in the coming days. - While the patient's bleeding is presumably secondary to the presence of a duodenal ulcer, further evaluation with endoscopy could be performed as indicated. Electronically Signed   By: Sandi Mariscal M.D.   On: 04/21/2022 10:22   IR Angiogram Selective Each Additional Vessel  Result Date: 04/21/2022 INDICATION: Acute GI bleeding. Preceding CTA demonstrate extravasation of contrast into the descending portion of the duodenum. Given findings on preceding CTA as well as poor endoscopy candidacy due to advanced age and comorbidities, please perform mesenteric arteriogram and potential percutaneous embolization. EXAM: 1. ULTRASOUND GUIDANCE FOR ARTERIAL ACCESS 2. SELECTIVE CELIAC ARTERIOGRAM 3. SELECTIVE COMMON HEPATIC ARTERIOGRAM 4. SELECTIVE GASTRODUODENAL ARTERIOGRAM AND PERCUTANEOUS COIL EMBOLIZATION COMPARISON:  CT abdomen pelvis-04/20/2022 MEDICATIONS: None ANESTHESIA/SEDATION: None CONTRAST:  75 cc Omnipaque 300 FLUOROSCOPY TIME:  13 minutes, 18 seconds (423 mGy) COMPLICATIONS: None immediate. PROCEDURE: Informed consent was obtained from the patient, the patient's wife and daughter following explanation of the procedure, risks, benefits and alternatives. All questions were addressed. A time out was performed prior to the initiation of the procedure. Maximal barrier sterile technique utilized including caps, mask, sterile gowns, sterile gloves, large sterile drape, hand hygiene, and Betadine prep. The right femoral head was marked fluoroscopically. Under sterile conditions and  local anesthesia, the right common femoral artery access was performed with a micropuncture needle. Under direct ultrasound guidance, the right common femoral was accessed with a micropuncture kit. An ultrasound image was saved for documentation purposes. This allowed for placement of a 5-French vascular sheath. A limited arteriogram was performed through the side arm of the sheath confirming appropriate access within the right common femoral artery. Over a Bentson wire, a Mickelson catheter was advanced the caudal aspect of the thoracic aorta where was reformed, back bled and flushed. The Mickelson catheter was then utilized to select the celiac artery and a selective celiac arteriogram was performed With the use of a fathom 14 microwire, a regular Renegade microcatheter was utilized to select the gastroduodenal artery and a selective gastroduodenal arteriogram was performed Next, the GDA was percutaneously coil embolized with multiple overlapping 3 mm, 4 mm, 5 mm and 6 mm interlock coils two near the vessel's origin. Completion selective common hepatic arteriogram demonstrates no residual flow through the GDA. At this point, the procedure was terminated. All wires, catheters and sheaths were removed from the patient. Hemostasis was achieved at the right groin access site with manual compression. The patient tolerated the procedure well without immediate post procedural complication. FINDINGS: Selective celiac arteriogram demonstrates  the common hepatic artery ending in a bifurcation of the GDA and left hepatic artery with a replaced right hepatic artery noted to arise from the proximal SMA on preceding CTA. There is mild irregularity and vasospasm of the GDA without discrete area of contrast extravasation. Given location of active extravasation with the descending portion of the duodenum on preceding CTA, prophylactic percutaneous coil embolization of the GDA was performed Post embolization common hepatic  arteriogram demonstrates no residual flow through the GDA vascular territory. IMPRESSION: Technically successful prophylactic percutaneous coil embolization of the GDA for active extravasation within the descending portion of the duodenum demonstrated on preceding CTA. PLAN: - The patient is to remain flat for 4 hours with right leg straight. - The patient will continue to experience several additional bloody bowel movements and may continue to require additional resuscitation (as he was bleeding both before and during the procedure), however ultimately I am hopeful he will stabilize in the coming days. - While the patient's bleeding is presumably secondary to the presence of a duodenal ulcer, further evaluation with endoscopy could be performed as indicated. Electronically Signed   By: Sandi Mariscal M.D.   On: 04/21/2022 10:22   IR Angiogram Selective Each Additional Vessel  Result Date: 04/21/2022 INDICATION: Acute GI bleeding. Preceding CTA demonstrate extravasation of contrast into the descending portion of the duodenum. Given findings on preceding CTA as well as poor endoscopy candidacy due to advanced age and comorbidities, please perform mesenteric arteriogram and potential percutaneous embolization. EXAM: 1. ULTRASOUND GUIDANCE FOR ARTERIAL ACCESS 2. SELECTIVE CELIAC ARTERIOGRAM 3. SELECTIVE COMMON HEPATIC ARTERIOGRAM 4. SELECTIVE GASTRODUODENAL ARTERIOGRAM AND PERCUTANEOUS COIL EMBOLIZATION COMPARISON:  CT abdomen pelvis-04/20/2022 MEDICATIONS: None ANESTHESIA/SEDATION: None CONTRAST:  75 cc Omnipaque 300 FLUOROSCOPY TIME:  13 minutes, 18 seconds (564 mGy) COMPLICATIONS: None immediate. PROCEDURE: Informed consent was obtained from the patient, the patient's wife and daughter following explanation of the procedure, risks, benefits and alternatives. All questions were addressed. A time out was performed prior to the initiation of the procedure. Maximal barrier sterile technique utilized including caps,  mask, sterile gowns, sterile gloves, large sterile drape, hand hygiene, and Betadine prep. The right femoral head was marked fluoroscopically. Under sterile conditions and local anesthesia, the right common femoral artery access was performed with a micropuncture needle. Under direct ultrasound guidance, the right common femoral was accessed with a micropuncture kit. An ultrasound image was saved for documentation purposes. This allowed for placement of a 5-French vascular sheath. A limited arteriogram was performed through the side arm of the sheath confirming appropriate access within the right common femoral artery. Over a Bentson wire, a Mickelson catheter was advanced the caudal aspect of the thoracic aorta where was reformed, back bled and flushed. The Mickelson catheter was then utilized to select the celiac artery and a selective celiac arteriogram was performed With the use of a fathom 14 microwire, a regular Renegade microcatheter was utilized to select the gastroduodenal artery and a selective gastroduodenal arteriogram was performed Next, the GDA was percutaneously coil embolized with multiple overlapping 3 mm, 4 mm, 5 mm and 6 mm interlock coils two near the vessel's origin. Completion selective common hepatic arteriogram demonstrates no residual flow through the GDA. At this point, the procedure was terminated. All wires, catheters and sheaths were removed from the patient. Hemostasis was achieved at the right groin access site with manual compression. The patient tolerated the procedure well without immediate post procedural complication. FINDINGS: Selective celiac arteriogram demonstrates the common hepatic artery ending in  a bifurcation of the GDA and left hepatic artery with a replaced right hepatic artery noted to arise from the proximal SMA on preceding CTA. There is mild irregularity and vasospasm of the GDA without discrete area of contrast extravasation. Given location of active extravasation  with the descending portion of the duodenum on preceding CTA, prophylactic percutaneous coil embolization of the GDA was performed Post embolization common hepatic arteriogram demonstrates no residual flow through the GDA vascular territory. IMPRESSION: Technically successful prophylactic percutaneous coil embolization of the GDA for active extravasation within the descending portion of the duodenum demonstrated on preceding CTA. PLAN: - The patient is to remain flat for 4 hours with right leg straight. - The patient will continue to experience several additional bloody bowel movements and may continue to require additional resuscitation (as he was bleeding both before and during the procedure), however ultimately I am hopeful he will stabilize in the coming days. - While the patient's bleeding is presumably secondary to the presence of a duodenal ulcer, further evaluation with endoscopy could be performed as indicated. Electronically Signed   By: Sandi Mariscal M.D.   On: 04/21/2022 10:22   CT ANGIO GI BLEED  Result Date: 04/20/2022 CLINICAL DATA:  Rectal bleeding. EXAM: CTA ABDOMEN AND PELVIS WITHOUT AND WITH CONTRAST TECHNIQUE: Multidetector CT imaging of the abdomen and pelvis was performed using the standard protocol during bolus administration of intravenous contrast. Multiplanar reconstructed images and MIPs were obtained and reviewed to evaluate the vascular anatomy. RADIATION DOSE REDUCTION: This exam was performed according to the departmental dose-optimization program which includes automated exposure control, adjustment of the mA and/or kV according to patient size and/or use of iterative reconstruction technique. CONTRAST:  36m OMNIPAQUE IOHEXOL 350 MG/ML SOLN COMPARISON:  April 06, 2022. FINDINGS: VASCULAR Aorta: Atherosclerosis of abdominal aorta without aneurysm or dissection. Celiac: Patent without evidence of aneurysm, dissection, vasculitis or significant stenosis. SMA: Patent without evidence  of aneurysm, dissection, vasculitis or significant stenosis. Renals: Bilateral renal arteries are patent without evidence of aneurysm, dissection, vasculitis, fibromuscular dysplasia or significant stenosis. IMA: Patent without evidence of aneurysm, dissection, vasculitis or significant stenosis. Inflow: Patent without evidence of aneurysm, dissection, vasculitis or significant stenosis. Proximal Outflow: Bilateral common femoral and visualized portions of the superficial and profunda femoral arteries are patent without evidence of aneurysm, dissection, vasculitis or significant stenosis. Veins: No obvious venous abnormality within the limitations of this arterial phase study. Review of the MIP images confirms the above findings. NON-VASCULAR Lower chest: No acute abnormality seen in visualized lung bases. Moderate sized hiatal hernia is noted. Hepatobiliary: No focal liver abnormality is seen. Status post cholecystectomy. No biliary dilatation. Pancreas: Unremarkable. No pancreatic ductal dilatation or surrounding inflammatory changes. Spleen: Normal in size without focal abnormality. Adrenals/Urinary Tract: Adrenal glands appear normal. Bilateral renal cortical scarring is noted. Stable bilateral renal cysts are noted. Bilateral ureteral stents are noted in grossly good position. No hydronephrosis or renal obstruction is noted. Urinary bladder is decompressed. Stomach/Bowel: There appears to be active contrast extravasation involving the second portion the duodenum suggesting bleeding. There is no evidence of bowel obstruction. Extensive diverticulosis of sigmoid colon is noted without inflammation. Lymphatic: No definite adenopathy is noted. Reproductive: Status post prostatic brachytherapy seed placement. Other: No abdominal wall hernia or abnormality. No abdominopelvic ascites. Musculoskeletal: No acute or significant osseous findings. IMPRESSION: VASCULAR Small amount of focal contrast accumulation is noted in  the second portion of the duodenum suggesting gastrointestinal bleeding. Critical Value/emergent results were called by telephone at  the time of interpretation on 04/20/2022 at 5:30 pm to provider Dr. Tomi Bamberger, who verbally acknowledged these results. Aortic Atherosclerosis (ICD10-I70.0). NON-VASCULAR Moderate size hiatal hernia. Sigmoid diverticulosis without inflammation. Bilateral ureteral stents are noted in grossly good position. No hydronephrosis or renal obstruction is noted. Bilateral renal cortical atrophy is noted. Status post prostatic brachytherapy seed placement. Electronically Signed   By: Marijo Conception M.D.   On: 04/20/2022 17:30     Scheduled Meds:  sodium chloride   Intravenous Once   amiodarone  400 mg Oral BID   [START ON 04/23/2022] Chlorhexidine Gluconate Cloth  6 each Topical Daily   diltiazem  240 mg Oral Daily   finasteride  5 mg Oral QHS   metoprolol succinate  12.5 mg Oral Daily   mupirocin ointment  1 Application Nasal BID   tamsulosin  0.4 mg Oral Daily   Continuous Infusions:  sodium chloride 75 mL/hr at 04/22/22 0842   pantoprazole 8 mg/hr (04/22/22 0842)     LOS: 2 days   Time spent: [place], MD [facility] To contact the attending provider between 7A-7P or the covering provider during after hours 7P-7A, please log into the web site [email or web address] and access using universal [place] password for that web site. If you do not have the password, please call the hospital operator.  04/22/2022, 9:03 AM

## 2022-04-22 NOTE — Progress Notes (Signed)
Interventional Radiology Note   89 yo male, GI bleed, SP embolization of the GDA 04/20/22.   H/H: 9.8 (1/3) --> 8.2 (1/3) --> 8.9 (04/21/22) --> 7.6 (1/5)  Discussed with Dr. Verlon Au before CTA was repeated.  CTA has been completed.  Images reviewed.  Negative for evidence of bleed. No indication for emergent embo at this time.   Endo may be useful to confirm presence of gastritis/ulceration.   VIR is aware and available if change in stability overnight.  VIR to follow  Signed,  Dulcy Fanny. Earleen Newport, DO

## 2022-04-22 NOTE — Progress Notes (Signed)
[facility] Progress Note  Richard Reed 89 y.o. 12/10/1925   Subjective: Sitting in bedside chair. Denies abdominal pain. No family in room.  Objective: Vital signs: Vitals:   04/22/22 1400 04/22/22 1410  BP:  96/68  Pulse: 62 61  Resp: 12 (!) 30  Temp:    SpO2: 99% (!) 86%  T 97.6  Physical Exam: Gen: elderly, lethargic, well-nourished, pleasant, hard of hearing, no acute distress  HEENT: anicteric sclera CV: RRR Chest: CTA B Abd: soft, nontender, nondistended, +BS Ext: no edema  Lab Results: Recent Labs    04/21/22 0305 04/21/22 0845 04/22/22 0540  NA 135  --  137  K 4.6  --  4.1  CL 110  --  112*  CO2 18*  --  19*  GLUCOSE 125*  --  120*  BUN 47*  --  35*  CREATININE 1.34*  --  1.32*  CALCIUM 7.6*  --  7.7*  MG 1.9 1.9 1.9   Recent Labs    04/21/22 0305 04/22/22 0540  AST 12* 13*  ALT 11 12  ALKPHOS 38 41  BILITOT 0.9 0.8  PROT 4.5* 4.8*  ALBUMIN 2.5* 2.7*   Recent Labs    04/20/22 1441 04/20/22 1448 04/21/22 0305 04/22/22 0540  WBC 7.4  --  11.0* 5.9  NEUTROABS 6.5  --   --   --   HGB 9.8*   < > 8.9* 7.6*  HCT 30.1*   < > 27.7* 23.8*  MCV 100.0  --  100.7* 100.0  PLT 237  --  167 157   < > = values in this interval not displayed.      Assessment/Plan: Duodenal bleed presumably due to an ulcer - s/p embolization of GDA following a positive CT angiogram on 04/20/22. Two black stools today. Hgb 7.6 this morning. Recheck H/H this afternoon. Keep on clears due to black stools. May need a repeat CT angiogram if anemia worsens with ongoing melena. Dr. Watt Reed will f/u this weekend.   Richard Reed 04/22/2022, 2:58 PM  Questions please call [phone] ID: Richard Reed, male   DOB: [birth year], 89 y.o.   MRN: [id number]

## 2022-04-23 DIAGNOSIS — T39395A Adverse effect of other nonsteroidal anti-inflammatory drugs [NSAID], initial encounter: Secondary | ICD-10-CM | POA: Diagnosis not present

## 2022-04-23 DIAGNOSIS — K922 Gastrointestinal hemorrhage, unspecified: Secondary | ICD-10-CM | POA: Diagnosis not present

## 2022-04-23 LAB — CBC
HCT: 30 % — ABNORMAL LOW (ref 39.0–52.0)
HCT: 30.7 % — ABNORMAL LOW (ref 39.0–52.0)
Hemoglobin: 9.9 g/dL — ABNORMAL LOW (ref 13.0–17.0)
Hemoglobin: 9.9 g/dL — ABNORMAL LOW (ref 13.0–17.0)
MCH: 32.8 pg (ref 26.0–34.0)
MCH: 32.2 pg (ref 26.0–34.0)
MCHC: 33 g/dL (ref 30.0–36.0)
MCHC: 32.2 g/dL (ref 30.0–36.0)
MCV: 99.3 fL (ref 80.0–100.0)
MCV: 100 fL (ref 80.0–100.0)
Platelets: 146 10*3/uL — ABNORMAL LOW (ref 150–400)
Platelets: 138 10*3/uL — ABNORMAL LOW (ref 150–400)
RBC: 3.02 MIL/uL — ABNORMAL LOW (ref 4.22–5.81)
RBC: 3.07 MIL/uL — ABNORMAL LOW (ref 4.22–5.81)
RDW: 15.8 % — ABNORMAL HIGH (ref 11.5–15.5)
RDW: 16 % — ABNORMAL HIGH (ref 11.5–15.5)
WBC: 5 10*3/uL (ref 4.0–10.5)
WBC: 4.6 10*3/uL (ref 4.0–10.5)
nRBC: 0 % (ref 0.0–0.2)
nRBC: 0 % (ref 0.0–0.2)

## 2022-04-23 LAB — BPAM RBC
Blood Product Expiration Date: 202401312359
Blood Product Expiration Date: 202402032359
Blood Product Expiration Date: 202402032359
ISSUE DATE / TIME: 202401031657
ISSUE DATE / TIME: 202401051642
ISSUE DATE / TIME: 202401051839
Unit Type and Rh: 5100
Unit Type and Rh: 5100
Unit Type and Rh: 5100

## 2022-04-23 LAB — TYPE AND SCREEN
ABO/RH(D): O POS
Antibody Screen: NEGATIVE
Unit division: 0
Unit division: 0
Unit division: 0

## 2022-04-23 LAB — COMPREHENSIVE METABOLIC PANEL
ALT: 13 U/L (ref 0–44)
AST: 14 U/L — ABNORMAL LOW (ref 15–41)
Albumin: 2.3 g/dL — ABNORMAL LOW (ref 3.5–5.0)
Alkaline Phosphatase: 45 U/L (ref 38–126)
Anion gap: 7 (ref 5–15)
BUN: 29 mg/dL — ABNORMAL HIGH (ref 8–23)
CO2: 18 mmol/L — ABNORMAL LOW (ref 22–32)
Calcium: 7.7 mg/dL — ABNORMAL LOW (ref 8.9–10.3)
Chloride: 114 mmol/L — ABNORMAL HIGH (ref 98–111)
Creatinine, Ser: 1.42 mg/dL — ABNORMAL HIGH (ref 0.61–1.24)
GFR, Estimated: 46 mL/min — ABNORMAL LOW (ref >60)
Glucose, Bld: 97 mg/dL (ref 70–99)
Potassium: 3.9 mmol/L (ref 3.5–5.1)
Sodium: 139 mmol/L (ref 135–145)
Total Bilirubin: 1 mg/dL (ref 0.3–1.2)
Total Protein: 4.7 g/dL — ABNORMAL LOW (ref 6.5–8.1)

## 2022-04-23 MED ORDER — HYDROCODONE-ACETAMINOPHEN 5-325 MG PO TABS: 1.0000 | ORAL_TABLET | Freq: Three times a day (TID) | ORAL | Status: DC | PRN

## 2022-04-23 NOTE — Progress Notes (Signed)
[name] 9:40 AM  Subjective: Patient seen and examined and discussed with my partner Dr. Michail Sermon and his hospital computer chart reviewed and his case discussed with his nurse as well as his daughter on the phone and he is actually doing well today has not had a bowel movement no abdominal pain and no new complaints  Objective: Vital signs stable afebrile no acute distress elderly pleasant does not hear well even with hearing aids abdomen is soft nontender hemoglobin increased nicely with 2 units of blood according to the nurse from 5-10 BUN downtrending creatinine slight bump  Assessment: GI bleeding status post angiogram  Plan: I discussed endoscopy including the risks with the daughter and she will discuss it further with her other daughter and would proceed sooner as needed signs of bleeding otherwise consider it electively over the next few days just to be sure and please call me sooner if signs of active bleeding otherwise we will check on tomorrow  [facility]  office [phone] After 5PM or if no answer call [phone]

## 2022-04-23 NOTE — Progress Notes (Signed)
Referring Physician(s): Wilford Corner  Supervising Physician: Corrie Mckusick  Patient Status:  [facility] - In-pt  Chief Complaint:  F/U Embo for GI bleed  Brief History:  Richard Reed is a 89 y.o. male who presented to the hospital on 04/20/22 with the acute onset of rectal bleeding.   He was taking Eliquis for Afib.   He underwent mesenteric arteriogram and prophylactic coil embolization of active GI bleeding within the descending duodenum demonstrated on preceding CTA by Dr. Pascal Lux.  Dr. Earleen Newport was called last night due to trending decrease in hemoglobin = 9.8 (1/3) --> 8.2 (1/3) --> 8.9 (04/21/22) --> 7.6 (1/5)   CTA was negative for bleeding.  Transfused with 2 units PRBCs.  Seen by Dr. Watt Climes this morning to discuss endoscopy - no urgent need at this time.  Subjective:  Doing well. VERY hard of hearing, difficult to talk to because he can't hear me, but doesn't seem to have any complaints.  Allergies: Patient has no known allergies.  Medications: Prior to Admission medications   Medication Sig Start Date End Date Taking? Authorizing Provider  apixaban (ELIQUIS) 2.5 MG TABS tablet Take 1 tablet (2.5 mg total) by mouth 2 (two) times daily. 04/13/22  Yes Lorretta Harp, MD  B Complex-C (SUPER B COMPLEX PO) Take 1 tablet by mouth daily.   Yes [provider]  feeding supplement, ENSURE ENLIVE, (ENSURE ENLIVE) LIQD Take 237 mLs by mouth 2 (two) times daily between meals. Patient taking differently: Take 237 mLs by mouth daily. 03/02/16  Yes Vann, Jessica U, DO  finasteride (PROSCAR) 5 MG tablet Take 1 tablet by mouth daily Patient taking differently: Take 5 mg by mouth at bedtime. 11/24/21  Yes   furosemide (LASIX) 20 MG tablet TAKE 1 TABLET BY MOUTH DAILY. Patient taking differently: Take 20 mg by mouth in the morning. 03/29/21 06/29/22 Yes Lorretta Harp, MD  HYDROcodone-acetaminophen (NORCO/VICODIN) 5-325 MG tablet Take 1 tablet by mouth every 4 (four) hours  as needed for moderate pain. 04/07/22  Yes [name], MD  metoprolol succinate (TOPROL XL) 25 MG 24 hr tablet Take 1 tablet (25 mg) by mouth in the morning, and 1/2 tablet (12.5 mg) by mouth in the evening. Patient taking differently: Take 12.5-25 mg by mouth See admin instructions. Take 12.5 mg by mouth in the morning, and 25 mg in the evening 03/29/22  Yes Lorretta Harp, MD  Multiple Vitamins-Minerals (MULTIVITAMIN WITH MINERALS) tablet Take 1 tablet by mouth daily with breakfast.   Yes [provider]  Multiple Vitamins-Minerals (PRESERVISION AREDS 2) CAPS Take 1 capsule by mouth in the morning and at bedtime.   Yes [provider]  Propylene Glycol (SYSTANE BALANCE OP) Place 1 drop into both eyes 2 (two) times daily as needed (for dryness).   Yes [provider]  rosuvastatin (CRESTOR) 10 MG tablet TAKE 1 TABLET BY MOUTH DAILY. Patient taking differently: Take 10 mg by mouth at bedtime. 12/27/21 12/27/22 Yes Lorretta Harp, MD  tamsulosin (FLOMAX) 0.4 MG CAPS capsule Take 1 capsule (0.4 mg total) by mouth nightly at bedtime. 02/24/22  Yes   cephALEXin (KEFLEX) 500 MG capsule Take 1 capsule by mouth twice daily Patient not taking: Reported on 04/20/2022 04/06/22   [name], MD  ciprofloxacin (CIPRO) 500 MG tablet Take 1 tablet by mouth 2 times a day Patient not taking: Reported on 04/20/2022 06/29/21     finasteride (PROSCAR) 5 MG tablet Take 1 tablet (5 mg total) by  mouth daily. Patient not taking: Reported on 04/20/2022 02/24/22     fluorouracil (EFUDEX) 5 % cream Apply to the affected area twice a day on the scalp x 4 weeks as tolerated Patient not taking: Reported on 04/20/2022 10/28/21     sulfamethoxazole-trimethoprim (BACTRIM) 400-80 MG tablet Take 1 tablet by mouth 2 (two) times daily. Patient not taking: Reported on 04/19/2022 04/11/22     tamsulosin (FLOMAX) 0.4 MG CAPS capsule Take 1 capsule (0.4 mg total) by mouth daily. Patient not taking:  Reported on 04/20/2022 12/12/16   Ardis Hughs, MD  tamsulosin (FLOMAX) 0.4 MG CAPS capsule Take 1 capsule by mouth at bedtime. Patient not taking: Reported on 04/20/2022 06/02/21     trimethoprim (TRIMPEX) 100 MG tablet Take 1/2 tablet (50 mg total) by mouth at bedtime once current antibiotic is finished. Patient not taking: Reported on 04/19/2022 04/13/22        Vital Signs: BP 113/66   Pulse 70   Temp 97.7 F (36.5 C) (Oral)   Resp (!) 8   Ht '5\' 7"'$  (1.702 m)   Wt 143 lb 4.8 oz (65 kg)   SpO2 99%   BMI 22.44 kg/m   Physical Exam Constitutional:      Appearance: Normal appearance.  HENT:     Head: Normocephalic and atraumatic.  Cardiovascular:     Rate and Rhythm: Normal rate.  Pulmonary:     Effort: Pulmonary effort is normal. No respiratory distress.  Abdominal:     Palpations: Abdomen is soft.     Tenderness: There is no abdominal tenderness.  Musculoskeletal:     Comments: Right groin access site looks good, no bleeding, no hematoma.  Skin:    General: Skin is warm and dry.  Neurological:     General: No focal deficit present.     Mental Status: He is alert and oriented to person, place, and time.  Psychiatric:        Mood and Affect: Mood normal.        Behavior: Behavior normal.        Thought Content: Thought content normal.        Judgment: Judgment normal.      Labs:  CBC: Recent Labs    04/21/22 0305 04/22/22 0540 04/22/22 1526 04/22/22 2245 04/23/22 0259  WBC 11.0* 5.9 4.5  --  5.0  HGB 8.9* 7.6* 5.4* 10.1* 9.9*  HCT 27.7* 23.8* 17.4* 30.3* 30.0*  PLT 167 157 105*  --  146*    COAGS: Recent Labs    04/20/22 1441  INR 1.5*  APTT 31    BMP: Recent Labs    04/20/22 1441 04/20/22 1448 04/21/22 0305 04/22/22 0540 04/23/22 0259  NA 134* 134* 135 137 139  K 5.0 5.0 4.6 4.1 3.9  CL 103 104 110 112* 114*  CO2 22  --  18* 19* 18*  GLUCOSE 135* 131* 125* 120* 97  BUN 55* 47* 47* 35* 29*  CALCIUM 8.3*  --  7.6* 7.7* 7.7*   CREATININE 1.66* 1.90* 1.34* 1.32* 1.42*  GFRNONAA 38*  --  49* 50* 46*    LIVER FUNCTION TESTS: Recent Labs    04/20/22 1441 04/21/22 0305 04/22/22 0540 04/23/22 0259  BILITOT 0.6 0.9 0.8 1.0  AST 16 12* 13* 14*  ALT '13 11 12 13  '$ ALKPHOS 55 38 41 45  PROT 5.8* 4.5* 4.8* 4.7*  ALBUMIN 3.2* 2.5* 2.7* 2.3*    Assessment and Plan:  Acute onset of rectal  bleeding = S/P mesenteric arteriogram and prophylactic coil embolization of active GI bleeding within the descending duodenum demonstrated on preceding CTA by Dr. Pascal Lux.  Concern for continued bleeding last night - CTA reviewed by Dr. Earleen Newport. No evidence of re-bleed.  Agree with Dr. Watt Climes note.  Right groin bandage removed, ok for patient to shower. No need for additional bandage.  Electronically Signed: Murrell Redden, PA-C 04/23/2022, 10:49 AM    I spent a total of 15 Minutes at the the patient's bedside AND on the patient's hospital floor or unit, greater than 50% of which was counseling/coordinating care for f/u embo for GI bleed.

## 2022-04-23 NOTE — Progress Notes (Signed)
PROGRESS NOTE   Richard Reed  MKL:[id number] DOB: [birth year] DOA: 04/20/2022 PCP: [name], FNP  Brief Narrative:  89 year old white male known history of paroxysmal atrial fibrillation on anticoagulation diverticulosis previous CVA with right MCA aneurysm CKD 3 arthritis asbestosis COPD prior BPH--seen by [name] for kidney stones in 2012 and KUB 2023 Prior admission 12/01/2018 with GI bleed aspirin was held patient did not have colonoscopy   Recent surgery 04/07/2022 under [name] cystoscopy and bladder irrigation  Developed dark unformed stool 04/18/2022 Given Protonix Kcentra 2 units of blood Found to be in A-fib with RVR started on Cardizem  CTA showed active extravasation into the stomach   Hospital-Problem based course  GI bleed?  Diverticulosis - CTA showed active extravasation, IR performed mesenteric angiogram and embolization descending duodenum - Continue Protonix gtt. as per GI - Patient had further drop in hemoglobin and dark stool per rectum but repeat CTA performed 1/5 showed no active extravasation - Clear liquids only   Anemia of acute blood loss - Transfuse 1 unit of blood 1/3, transfused 2 more units 1/6 because of drop in hemoglobin -Hemodynamically stable but keep on stepdown given high risk of rebleed  A-fib RVR from PTA on Eliquis - RVR is resolved but patient became slightly hypotensive on 1/5 - 1/6 Meds adjusted to Cardizem CD 180, discontinued amiodarone because of narrow therapeutic index - Added back Toprol-XL 12.5 on 1/4 PM  AKI prior to admission superimposed on CKD 3 AA Mild hyperkalemia - Resolving with NS, cut rate from 125 to 75 cc/H-keep on saline - Keep on maintenance secondary to n.p.o. state   Previous CVA and cerebral aneurysm - Cannot give aspirin in current setting - Eliquis on hold and follow in outpatient setting   BPH - Continue Proscar 5 and Flomax 0.4 - Voiding trial today and remove Foley if  able  Asbestosis?  COPD - Not on inhalers seems stable  DVT prophylaxis: Contraindicated because of bleed Code Status: Confirmed full Family Communication: No family at bedside call daughter 1/6 to update Disposition:  Status is: Inpatient Remains inpatient appropriate because:   GI bleed A-fib RVR    Subjective:  Awake coherent No distress No specific further stools  Objective: Vitals:   04/23/22 0403 04/23/22 0500 04/23/22 0600 04/23/22 0838  BP:  113/66    Pulse: (!) 25 67 70   Resp: 14 (!) 8    Temp: 98 F (36.7 C)   97.7 F (36.5 C)  TempSrc: Oral   Oral  SpO2: 99% 99%    Weight:      Height:        Intake/Output Summary (Last 24 hours) at 04/23/2022 0914 Last data filed at 04/23/2022 0400 Gross per 24 hour  Intake 2050.07 ml  Output 1680 ml  Net 370.07 ml    Filed Weights   04/20/22 1505 04/20/22 2100  Weight: 64.4 kg 65 kg    Examination:  Coherent very hard of hearing Chest clear no added sound no wheeze rales rhonchi Holosystolic murmur best heard at LLSE ROM intact moving 4 limbs equally No lower extremity edema Power is 5/5  Data Reviewed: personally reviewed   CBC    Component Value Date/Time   WBC 5.0 04/23/2022 0259   RBC 3.02 (L) 04/23/2022 0259   HGB 9.9 (L) 04/23/2022 0259   HGB 15.0 07/12/2019 1224   HCT 30.0 (L) 04/23/2022 0259   HCT 43.7 07/12/2019 1224   PLT 146 (L) 04/23/2022 0259  PLT 122 (L) 07/12/2019 1224   MCV 99.3 04/23/2022 0259   MCV 95 07/12/2019 1224   MCH 32.8 04/23/2022 0259   MCHC 33.0 04/23/2022 0259   RDW 15.8 (H) 04/23/2022 0259   RDW 13.3 07/12/2019 1224   LYMPHSABS 0.5 (L) 04/20/2022 1441   MONOABS 0.4 04/20/2022 1441   EOSABS 0.0 04/20/2022 1441   BASOSABS 0.0 04/20/2022 1441      Latest Ref Rng & Units 04/23/2022    2:59 AM 04/22/2022    5:40 AM 04/21/2022    3:05 AM  CMP  Glucose 70 - 99 mg/dL 97  120  125   BUN 8 - 23 mg/dL 29  35  47   Creatinine 0.61 - 1.24 mg/dL 1.42  1.32  1.34   Sodium  135 - 145 mmol/L 139  137  135   Potassium 3.5 - 5.1 mmol/L 3.9  4.1  4.6   Chloride 98 - 111 mmol/L 114  112  110   CO2 22 - 32 mmol/L '18  19  18   '$ Calcium 8.9 - 10.3 mg/dL 7.7  7.7  7.6   Total Protein 6.5 - 8.1 g/dL 4.7  4.8  4.5   Total Bilirubin 0.3 - 1.2 mg/dL 1.0  0.8  0.9   Alkaline Phos 38 - 126 U/L 45  41  38   AST 15 - 41 U/L '14  13  12   '$ ALT 0 - 44 U/L '13  12  11      '$ Radiology Studies: CT ANGIO GI BLEED  Result Date: 04/22/2022 CLINICAL DATA:  Recent cystoscopy and bladder irrigation. Lower GI bleeding. GDA active bleeding. EXAM: CTA ABDOMEN AND PELVIS WITHOUT AND WITH CONTRAST TECHNIQUE: Multidetector CT imaging of the abdomen and pelvis was performed using the standard protocol during bolus administration of intravenous contrast. Multiplanar reconstructed images and MIPs were obtained and reviewed to evaluate the vascular anatomy. RADIATION DOSE REDUCTION: This exam was performed according to the departmental dose-optimization program which includes automated exposure control, adjustment of the mA and/or kV according to patient size and/or use of iterative reconstruction technique. CONTRAST:  152m OMNIPAQUE IOHEXOL 350 MG/ML SOLN COMPARISON:  CTA GI bleed 04/20/2022 FINDINGS: VASCULAR Aorta: Normal caliber aorta without aneurysm, dissection, vasculitis or significant stenosis. There is moderate atherosclerotic disease throughout the aorta similar to prior. Celiac: Patent without evidence of aneurysm, dissection, vasculitis or significant stenosis. There are new embolic coils in the region of the gastroduodenal artery. SMA: Patent without evidence of aneurysm, dissection, vasculitis or significant stenosis. Renals: Both renal arteries are patent without evidence of aneurysm, dissection, vasculitis, fibromuscular dysplasia or significant stenosis. Accessory right renal artery again noted. IMA: Patent without evidence of aneurysm, dissection, vasculitis or significant stenosis. Inflow:  Patent without evidence of aneurysm, dissection, vasculitis or significant stenosis. There is calcified atherosclerotic disease, unchanged. Proximal Outflow: Bilateral common femoral and visualized portions of the superficial and profunda femoral arteries are patent without evidence of aneurysm, dissection, vasculitis or significant stenosis. Veins: Within normal limits. Note is made that there is poor opacification of the IVC and venous structures due to timing of the contrast bolus. Review of the MIP images confirms the above findings. NON-VASCULAR Lower chest: Are small bilateral pleural effusions which have mildly increased. There is atelectasis in the lung bases. Hepatobiliary: Small arterial enhancing area seen in the left lobe of the liver measuring 8 mm image 5/50. This is not definitely seen on prior, possibly a flash fill hemangioma. No other liver lesions  are seen. The gallbladder is surgically absent. There is no biliary ductal dilatation. Status post cholecystectomy. No biliary dilatation. Pancreas: Unremarkable. No pancreatic ductal dilatation or surrounding inflammatory changes. Spleen: Normal in size without focal abnormality. Adrenals/Urinary Tract: There is no hydronephrosis or perinephric fluid collection. There is bilateral renal cortical scarring. Bilateral ureteral stents are in place. Left peripelvic cysts appear unchanged. Right renal cortical cyst, mildly complex, measures less than 1 cm and is unchanged. The adrenal glands are within normal limits. The bladder is decompressed by Foley catheter. Moderate-sized hiatal hernia is unchanged. Appendix appears normal. No evidence of bowel wall thickening, distention, or inflammatory changes. There is no evidence for active gastrointestinal bleeding. There is colonic diverticulosis without evidence for acute diverticulitis. The appendix is not seen. Lymphatic: No enlarged lymph nodes are identified. Reproductive: Prostate radiotherapy seeds are  present. Other: There is a small fat containing left inguinal hernia. Musculoskeletal: The bones are osteopenic. Multilevel degenerative changes affect the spine. Mild chronic compression deformity of T10 appears unchanged. Grade 1 anterolisthesis at L4-L5 appears unchanged. IMPRESSION: 1. No evidence for active gastrointestinal bleeding. 2. New embolic coils in the region of the gastroduodenal artery. Aortic Atherosclerosis (ICD10-I70.0). NON-VASCULAR 1. No acute localizing process in the abdomen or pelvis. 2. Small bilateral pleural effusions have mildly increased in size. 3. Bilateral ureteral stents in place. No hydronephrosis. 4. Colonic diverticulosis without evidence for acute diverticulitis. 5. Stable moderate-sized hiatal hernia. 6. Small arterial enhancing lesion in the left lobe of the liver, not definitely seen on prior, possibly a flash fill hemangioma. Electronically Signed   By: Ronney Asters M.D.   On: 04/22/2022 19:02     Scheduled Meds:  amiodarone  400 mg Oral BID   Chlorhexidine Gluconate Cloth  6 each Topical Daily   diltiazem  180 mg Oral Daily   finasteride  5 mg Oral QHS   metoprolol succinate  12.5 mg Oral Daily   mupirocin ointment  1 Application Nasal BID   tamsulosin  0.4 mg Oral Daily   Continuous Infusions:  sodium chloride 75 mL/hr at 04/23/22 0549   pantoprazole 8 mg/hr (04/23/22 0236)     LOS: 3 days   Time spent: 2  Nita Sells, MD [facility] To contact the attending provider between 7A-7P or the covering provider during after hours 7P-7A, please log into the web site [email or web address] and access using universal [place] password for that web site. If you do not have the password, please call the hospital operator.  04/23/2022, 9:14 AM

## 2022-04-24 DIAGNOSIS — T39395A Adverse effect of other nonsteroidal anti-inflammatory drugs [NSAID], initial encounter: Secondary | ICD-10-CM | POA: Diagnosis not present

## 2022-04-24 DIAGNOSIS — K922 Gastrointestinal hemorrhage, unspecified: Secondary | ICD-10-CM | POA: Diagnosis not present

## 2022-04-24 LAB — COMPREHENSIVE METABOLIC PANEL
ALT: 13 U/L (ref 0–44)
AST: 13 U/L — ABNORMAL LOW (ref 15–41)
Albumin: 2.4 g/dL — ABNORMAL LOW (ref 3.5–5.0)
Alkaline Phosphatase: 46 U/L (ref 38–126)
Anion gap: 5 (ref 5–15)
BUN: 20 mg/dL (ref 8–23)
CO2: 17 mmol/L — ABNORMAL LOW (ref 22–32)
Calcium: 7.7 mg/dL — ABNORMAL LOW (ref 8.9–10.3)
Chloride: 114 mmol/L — ABNORMAL HIGH (ref 98–111)
Creatinine, Ser: 1.15 mg/dL (ref 0.61–1.24)
GFR, Estimated: 59 mL/min — ABNORMAL LOW (ref >60)
Glucose, Bld: 90 mg/dL (ref 70–99)
Potassium: 3.8 mmol/L (ref 3.5–5.1)
Sodium: 136 mmol/L (ref 135–145)
Total Bilirubin: 1.3 mg/dL — ABNORMAL HIGH (ref 0.3–1.2)
Total Protein: 4.9 g/dL — ABNORMAL LOW (ref 6.5–8.1)

## 2022-04-24 LAB — CBC
HCT: 32.3 % — ABNORMAL LOW (ref 39.0–52.0)
Hemoglobin: 10.3 g/dL — ABNORMAL LOW (ref 13.0–17.0)
MCH: 32.4 pg (ref 26.0–34.0)
MCHC: 31.9 g/dL (ref 30.0–36.0)
MCV: 101.6 fL — ABNORMAL HIGH (ref 80.0–100.0)
Platelets: 146 10*3/uL — ABNORMAL LOW (ref 150–400)
RBC: 3.18 MIL/uL — ABNORMAL LOW (ref 4.22–5.81)
RDW: 16.3 % — ABNORMAL HIGH (ref 11.5–15.5)
WBC: 4.4 10*3/uL (ref 4.0–10.5)
nRBC: 0 % (ref 0.0–0.2)

## 2022-04-24 MED ORDER — METOPROLOL TARTRATE 25 MG PO TABS
25.0000 mg | ORAL_TABLET | Freq: Two times a day (BID) | ORAL | Status: DC
  Administered 2022-04-24 – 2022-04-26 (×4): 25 mg via ORAL
  Filled 2022-04-24 (×4): qty 1

## 2022-04-24 MED ORDER — METOPROLOL SUCCINATE ER 25 MG PO TB24: 12.5000 mg | ORAL_TABLET | Freq: Once | ORAL | Status: DC

## 2022-04-24 NOTE — Progress Notes (Signed)
Richard Reed 9:08 AM  Subjective: Patient doing well without any new complaints and tolerating clear liquids and no further bowel movements and no new complaints and case discussed with a nurse as well  Objective: Vital signs stable afebrile no acute distress abdomen is soft nontender hemoglobin stable BUN slight decrease  Assessment: Seemingly resolved GI bleeding  Plan: 1 more day of ICU monitoring and clear liquids and then if no signs of further bleeding probably can advance diet tomorrow and will ask my partner Dr. Therisa Doyne tomorrow to check on patient and decide with family whether an endoscopy just to confirm what was bleeding should be done and please call me sooner if any GI problem this weekend  [facility]  office [phone] After 5PM or if no answer call [phone]

## 2022-04-24 NOTE — Progress Notes (Signed)
PROGRESS NOTE   Richard Reed  YTK:[id number] DOB: [birth year] DOA: 04/20/2022 PCP: [name], FNP  Brief Narrative:  89 year old white male known history of paroxysmal atrial fibrillation on anticoagulation diverticulosis previous CVA with right MCA aneurysm CKD 3 arthritis asbestosis COPD prior BPH--seen by [name] for kidney stones in 2012 and KUB 2023 Prior admission 12/01/2018 with GI bleed aspirin was held patient did not have colonoscopy   Recent surgery 04/07/2022 under [name] cystoscopy and bladder irrigation  Developed dark unformed stool 04/18/2022 Given Protonix Kcentra 2 units of blood Found to be in A-fib with RVR started on Cardizem  CTA showed active extravasation into the stomach   Hospital-Problem based course  GI bleed?  Diverticulosis - CTA showed active extravasation, IR performed mesenteric angiogram and embolization descending duodenum - Continue Protonix gtt. as per GI - Patient had further drop in hemoglobin and dark stool per rectum but repeat CTA performed 1/5 showed no active extravasation - Clear liquids only -Defer scope as well as next steps to gastroenterology   Anemia of acute blood loss - Transfuse 1 unit of blood 1/3, transfused 2 more units 1/6 because of drop in hemoglobin -Hemodynamically stable from bleeding perspective  A-fib RVR from PTA on Eliquis - RVR is resolved but patient became slightly hypotensive on 1/5 - 1/6 Meds adjusted to Cardizem CD 180, discontinued amiodarone because of narrow therapeutic index - 1/7 extra dose of Toprol 12.5 now and then changed to 25 twice daily for 1/8  AKI prior to admission superimposed on CKD 3 AA Mild hyperkalemia - Resolving with NS, cut rate from 125 to 75 cc/H-keep on saline - Keep on maintenance secondary to n.p.o. state   Previous CVA and cerebral aneurysm - Cannot give aspirin in current setting - Eliquis on hold and follow in outpatient setting   BPH - Continue Proscar 5  and Flomax 0.4 - Foley removed  Asbestosis?  COPD - Not on inhalers seems stable  DVT prophylaxis: Contraindicated because of bleed Code Status: Confirmed full Family Communication: Updated family who are at the bedside they are aware of plans Disposition:  Status is: Inpatient Remains inpatient appropriate because:   GI bleed A-fib RVR    Subjective:  Awake coherent No distress No further bleeding He looks comfortable   Objective: Vitals:   04/24/22 0800 04/24/22 0900 04/24/22 1000 04/24/22 1058  BP:   (!) 119/44   Pulse: 88 76 (!) 50   Resp: 13 (!) 23 16   Temp:    97.8 F (36.6 C)  TempSrc:    Oral  SpO2: 96% 98% 97%   Weight:      Height:        Intake/Output Summary (Last 24 hours) at 04/24/2022 1115 Last data filed at 04/24/2022 1000 Gross per 24 hour  Intake 2005.16 ml  Output 1220 ml  Net 785.16 ml    Filed Weights   04/20/22 1505 04/20/22 2100  Weight: 64.4 kg 65 kg    Examination:  Coherent very hard of hearing Chest clear no rales rhonchi sitting in chair Holosystolic murmur ROM intact No lower extremity edema  Data Reviewed: personally reviewed   CBC    Component Value Date/Time   WBC 4.4 04/24/2022 0338   RBC 3.18 (L) 04/24/2022 0338   HGB 10.3 (L) 04/24/2022 0338   HGB 15.0 07/12/2019 1224   HCT 32.3 (L) 04/24/2022 0338   HCT 43.7 07/12/2019 1224   PLT 146 (L) 04/24/2022 0338   PLT  122 (L) 07/12/2019 1224   MCV 101.6 (H) 04/24/2022 0338   MCV 95 07/12/2019 1224   MCH 32.4 04/24/2022 0338   MCHC 31.9 04/24/2022 0338   RDW 16.3 (H) 04/24/2022 0338   RDW 13.3 07/12/2019 1224   LYMPHSABS 0.5 (L) 04/20/2022 1441   MONOABS 0.4 04/20/2022 1441   EOSABS 0.0 04/20/2022 1441   BASOSABS 0.0 04/20/2022 1441      Latest Ref Rng & Units 04/24/2022    3:38 AM 04/23/2022    2:59 AM 04/22/2022    5:40 AM  CMP  Glucose 70 - 99 mg/dL 90  97  120   BUN 8 - 23 mg/dL 20  29  35   Creatinine 0.61 - 1.24 mg/dL 1.15  1.42  1.32   Sodium 135 -  145 mmol/L 136  139  137   Potassium 3.5 - 5.1 mmol/L 3.8  3.9  4.1   Chloride 98 - 111 mmol/L 114  114  112   CO2 22 - 32 mmol/L '17  18  19   '$ Calcium 8.9 - 10.3 mg/dL 7.7  7.7  7.7   Total Protein 6.5 - 8.1 g/dL 4.9  4.7  4.8   Total Bilirubin 0.3 - 1.2 mg/dL 1.3  1.0  0.8   Alkaline Phos 38 - 126 U/L 46  45  41   AST 15 - 41 U/L '13  14  13   '$ ALT 0 - 44 U/L '13  13  12      '$ Radiology Studies: CT ANGIO GI BLEED  Result Date: 04/22/2022 CLINICAL DATA:  Recent cystoscopy and bladder irrigation. Lower GI bleeding. GDA active bleeding. EXAM: CTA ABDOMEN AND PELVIS WITHOUT AND WITH CONTRAST TECHNIQUE: Multidetector CT imaging of the abdomen and pelvis was performed using the standard protocol during bolus administration of intravenous contrast. Multiplanar reconstructed images and MIPs were obtained and reviewed to evaluate the vascular anatomy. RADIATION DOSE REDUCTION: This exam was performed according to the departmental dose-optimization program which includes automated exposure control, adjustment of the mA and/or kV according to patient size and/or use of iterative reconstruction technique. CONTRAST:  133m OMNIPAQUE IOHEXOL 350 MG/ML SOLN COMPARISON:  CTA GI bleed 04/20/2022 FINDINGS: VASCULAR Aorta: Normal caliber aorta without aneurysm, dissection, vasculitis or significant stenosis. There is moderate atherosclerotic disease throughout the aorta similar to prior. Celiac: Patent without evidence of aneurysm, dissection, vasculitis or significant stenosis. There are new embolic coils in the region of the gastroduodenal artery. SMA: Patent without evidence of aneurysm, dissection, vasculitis or significant stenosis. Renals: Both renal arteries are patent without evidence of aneurysm, dissection, vasculitis, fibromuscular dysplasia or significant stenosis. Accessory right renal artery again noted. IMA: Patent without evidence of aneurysm, dissection, vasculitis or significant stenosis. Inflow: Patent  without evidence of aneurysm, dissection, vasculitis or significant stenosis. There is calcified atherosclerotic disease, unchanged. Proximal Outflow: Bilateral common femoral and visualized portions of the superficial and profunda femoral arteries are patent without evidence of aneurysm, dissection, vasculitis or significant stenosis. Veins: Within normal limits. Note is made that there is poor opacification of the IVC and venous structures due to timing of the contrast bolus. Review of the MIP images confirms the above findings. NON-VASCULAR Lower chest: Are small bilateral pleural effusions which have mildly increased. There is atelectasis in the lung bases. Hepatobiliary: Small arterial enhancing area seen in the left lobe of the liver measuring 8 mm image 5/50. This is not definitely seen on prior, possibly a flash fill hemangioma. No other liver lesions  are seen. The gallbladder is surgically absent. There is no biliary ductal dilatation. Status post cholecystectomy. No biliary dilatation. Pancreas: Unremarkable. No pancreatic ductal dilatation or surrounding inflammatory changes. Spleen: Normal in size without focal abnormality. Adrenals/Urinary Tract: There is no hydronephrosis or perinephric fluid collection. There is bilateral renal cortical scarring. Bilateral ureteral stents are in place. Left peripelvic cysts appear unchanged. Right renal cortical cyst, mildly complex, measures less than 1 cm and is unchanged. The adrenal glands are within normal limits. The bladder is decompressed by Foley catheter. Moderate-sized hiatal hernia is unchanged. Appendix appears normal. No evidence of bowel wall thickening, distention, or inflammatory changes. There is no evidence for active gastrointestinal bleeding. There is colonic diverticulosis without evidence for acute diverticulitis. The appendix is not seen. Lymphatic: No enlarged lymph nodes are identified. Reproductive: Prostate radiotherapy seeds are present.  Other: There is a small fat containing left inguinal hernia. Musculoskeletal: The bones are osteopenic. Multilevel degenerative changes affect the spine. Mild chronic compression deformity of T10 appears unchanged. Grade 1 anterolisthesis at L4-L5 appears unchanged. IMPRESSION: 1. No evidence for active gastrointestinal bleeding. 2. New embolic coils in the region of the gastroduodenal artery. Aortic Atherosclerosis (ICD10-I70.0). NON-VASCULAR 1. No acute localizing process in the abdomen or pelvis. 2. Small bilateral pleural effusions have mildly increased in size. 3. Bilateral ureteral stents in place. No hydronephrosis. 4. Colonic diverticulosis without evidence for acute diverticulitis. 5. Stable moderate-sized hiatal hernia. 6. Small arterial enhancing lesion in the left lobe of the liver, not definitely seen on prior, possibly a flash fill hemangioma. Electronically Signed   By: Ronney Asters M.D.   On: 04/22/2022 19:02     Scheduled Meds:  Chlorhexidine Gluconate Cloth  6 each Topical Daily   diltiazem  180 mg Oral Daily   finasteride  5 mg Oral QHS   metoprolol succinate  12.5 mg Oral Once   metoprolol tartrate  25 mg Oral BID   mupirocin ointment  1 Application Nasal BID   tamsulosin  0.4 mg Oral Daily   Continuous Infusions:  sodium chloride 75 mL/hr at 04/24/22 0945     LOS: 4 days   Time spent: [place], MD [facility] To contact the attending provider between 7A-7P or the covering provider during after hours 7P-7A, please log into the web site [email or web address] and access using universal [place] password for that web site. If you do not have the password, please call the hospital operator.  04/24/2022, 11:15 AM

## 2022-04-25 DIAGNOSIS — T39395A Adverse effect of other nonsteroidal anti-inflammatory drugs [NSAID], initial encounter: Secondary | ICD-10-CM | POA: Diagnosis not present

## 2022-04-25 DIAGNOSIS — K922 Gastrointestinal hemorrhage, unspecified: Secondary | ICD-10-CM | POA: Diagnosis not present

## 2022-04-25 LAB — CBC WITH DIFFERENTIAL/PLATELET
Abs Immature Granulocytes: 0.03 10*3/uL (ref 0.00–0.07)
Basophils Absolute: 0 10*3/uL (ref 0.0–0.1)
Basophils Relative: 1 %
Eosinophils Absolute: 0.1 10*3/uL (ref 0.0–0.5)
Eosinophils Relative: 1 %
HCT: 32.7 % — ABNORMAL LOW (ref 39.0–52.0)
Hemoglobin: 10.4 g/dL — ABNORMAL LOW (ref 13.0–17.0)
Immature Granulocytes: 1 %
Lymphocytes Relative: 14 %
Lymphs Abs: 0.6 10*3/uL — ABNORMAL LOW (ref 0.7–4.0)
MCH: 32 pg (ref 26.0–34.0)
MCHC: 31.8 g/dL (ref 30.0–36.0)
MCV: 100.6 fL — ABNORMAL HIGH (ref 80.0–100.0)
Monocytes Absolute: 0.3 10*3/uL (ref 0.1–1.0)
Monocytes Relative: 9 %
Neutro Abs: 2.9 10*3/uL (ref 1.7–7.7)
Neutrophils Relative %: 74 %
Platelets: 151 10*3/uL (ref 150–400)
RBC: 3.25 MIL/uL — ABNORMAL LOW (ref 4.22–5.81)
RDW: 16.1 % — ABNORMAL HIGH (ref 11.5–15.5)
WBC: 3.9 10*3/uL — ABNORMAL LOW (ref 4.0–10.5)
nRBC: 0 % (ref 0.0–0.2)

## 2022-04-25 MED ORDER — PANTOPRAZOLE SODIUM 40 MG PO TBEC
40.0000 mg | DELAYED_RELEASE_TABLET | Freq: Two times a day (BID) | ORAL | Status: DC
  Administered 2022-04-25 – 2022-04-26 (×2): 40 mg via ORAL
  Filled 2022-04-25 (×2): qty 1

## 2022-04-25 NOTE — Progress Notes (Signed)
The patient ambulated a half a lap around the unit with very little assistance. He was able to get out of the bed with minimal assistance and utilized a walker while ambulating(normally uses a cane).

## 2022-04-25 NOTE — Progress Notes (Signed)
Subjective: Patient is very hard of hearing, denies abdominal pain, is aware that he is in the hospital. Spoke with his daughter Arrie Aran at [phone] and his nurse Shanon Brow.  No further evidence of bleeding.  Objective: Vital signs in last 24 hours: Temp:  [97.5 F (36.4 C)-97.9 F (36.6 C)] 97.6 F (36.4 C) (01/08 0800) Pulse Rate:  [35-122] 72 (01/08 1000) Resp:  [13-23] 20 (01/08 1000) BP: (104-176)/(54-131) 148/131 (01/08 1000) SpO2:  [87 %-100 %] 95 % (01/08 1000) Weight change:  Last BM Date : 04/24/22  PE: Elderly, hard of hearing, prominent pallor GENERAL: Awake, follows commands  ABDOMEN: Soft, nondistended, nontender, normoactive bowel sounds EXTREMITIES: No deformity  Lab Results: Results for orders placed or performed during the hospital encounter of 04/20/22 (from the past 48 hour(s))  CBC     Status: Abnormal   Collection Time: 04/23/22  3:15 PM  Result Value Ref Range   WBC 4.6 4.0 - 10.5 K/uL   RBC 3.07 (L) 4.22 - 5.81 MIL/uL   Hemoglobin 9.9 (L) 13.0 - 17.0 g/dL   HCT 30.7 (L) 39.0 - 52.0 %   MCV 100.0 80.0 - 100.0 fL   MCH 32.2 26.0 - 34.0 pg   MCHC 32.2 30.0 - 36.0 g/dL   RDW 16.0 (H) 11.5 - 15.5 %   Platelets 138 (L) 150 - 400 K/uL   nRBC 0.0 0.0 - 0.2 %    Comment: Performed at [facility], [place] [address] [place], [place] [address]  CBC     Status: Abnormal   Collection Time: 04/24/22  3:38 AM  Result Value Ref Range   WBC 4.4 4.0 - 10.5 K/uL   RBC 3.18 (L) 4.22 - 5.81 MIL/uL   Hemoglobin 10.3 (L) 13.0 - 17.0 g/dL   HCT 32.3 (L) 39.0 - 52.0 %   MCV 101.6 (H) 80.0 - 100.0 fL   MCH 32.4 26.0 - 34.0 pg   MCHC 31.9 30.0 - 36.0 g/dL   RDW 16.3 (H) 11.5 - 15.5 %   Platelets 146 (L) 150 - 400 K/uL   nRBC 0.0 0.0 - 0.2 %    Comment: Performed at [facility], [place] [address], [place], [place] [address]  Comprehensive metabolic panel     Status: Abnormal   Collection Time: 04/24/22  3:38 AM  Result Value Ref Range    Sodium 136 135 - 145 mmol/L   Potassium 3.8 3.5 - 5.1 mmol/L   Chloride 114 (H) 98 - 111 mmol/L   CO2 17 (L) 22 - 32 mmol/L   Glucose, Bld 90 70 - 99 mg/dL    Comment: Glucose reference range applies only to samples taken after fasting for at least 8 hours.   BUN 20 8 - 23 mg/dL   Creatinine, Ser 1.15 0.61 - 1.24 mg/dL   Calcium 7.7 (L) 8.9 - 10.3 mg/dL   Total Protein 4.9 (L) 6.5 - 8.1 g/dL   Albumin 2.4 (L) 3.5 - 5.0 g/dL   AST 13 (L) 15 - 41 U/L   ALT 13 0 - 44 U/L   Alkaline Phosphatase 46 38 - 126 U/L   Total Bilirubin 1.3 (H) 0.3 - 1.2 mg/dL   GFR, Estimated 59 (L) >60 mL/min    Comment: (NOTE) Calculated using the CKD-EPI Creatinine Equation (2021)    Anion gap 5 5 - 15    Comment: Performed at [facility], [place] [address] [place], [place] [address]  CBC with Differential/Platelet  Status: Abnormal   Collection Time: 04/25/22  3:10 AM  Result Value Ref Range   WBC 3.9 (L) 4.0 - 10.5 K/uL   RBC 3.25 (L) 4.22 - 5.81 MIL/uL   Hemoglobin 10.4 (L) 13.0 - 17.0 g/dL   HCT 32.7 (L) 39.0 - 52.0 %   MCV 100.6 (H) 80.0 - 100.0 fL   MCH 32.0 26.0 - 34.0 pg   MCHC 31.8 30.0 - 36.0 g/dL   RDW 16.1 (H) 11.5 - 15.5 %   Platelets 151 150 - 400 K/uL   nRBC 0.0 0.0 - 0.2 %   Neutrophils Relative % 74 %   Neutro Abs 2.9 1.7 - 7.7 K/uL   Lymphocytes Relative 14 %   Lymphs Abs 0.6 (L) 0.7 - 4.0 K/uL   Monocytes Relative 9 %   Monocytes Absolute 0.3 0.1 - 1.0 K/uL   Eosinophils Relative 1 %   Eosinophils Absolute 0.1 0.0 - 0.5 K/uL   Basophils Relative 1 %   Basophils Absolute 0.0 0.0 - 0.1 K/uL   Immature Granulocytes 1 %   Abs Immature Granulocytes 0.03 0.00 - 0.07 K/uL    Comment: Performed at [facility], [place] [address] [place], [place] [address]    Studies/Results: No results found.  Medications: I have reviewed the patient's current medications.  Assessment: GI bleed-suspected upper GI bleed with positive CTA, small amount of  focal contrast accumulation in second portion of duodenum Noted to have moderate-sized hiatal hernia, sigmoid diverticulosis Status post selective celiac arteriogram, selective common hepatic arteriogram, selective gastroduodenal arteriogram and percutaneous coil embolization on 04/21/2022  Due to advanced age, 73 years, and comorbidities, patient considered a poor endoscopy candidate. PRBC 1 unit transfusion on 04/20/2022 and 2 units and 04/22/2022. Remains hemodynamically stable Hemoglobin on admission was 5.4 since then has been 10.1/9.9/9.9/10.3/10.4 BUN has trended down from 47 and normalized to 20  Plan: Discussed with the patient and his daughter Arrie Aran over the phone. Upper GI bleeding seems to have resolved. They do not want an endoscopy due to his advanced age. Will start patient on regular diet. Since it is presumed that he was bleeding from second portion of the duodenum, possibly peptic ulcer disease related, I have started patient on pantoprazole 40 mg twice daily and recommend to continue it for the next 60 days. Patient used to be on Eliquis, recommend holding it for the next 7 days. GI will sign off, please recall if needed.  Ronnette Juniper, MD 04/25/2022, 10:32 AM

## 2022-04-25 NOTE — Progress Notes (Incomplete)
Patient

## 2022-04-25 NOTE — Progress Notes (Addendum)
PROGRESS NOTE   Richard Reed  WER:[id number] DOB: 1925/05/07 DOA: 04/20/2022 PCP: [name], FNP  Brief Narrative:  89 year old white male known history of paroxysmal atrial fibrillation on anticoagulation diverticulosis previous CVA with right MCA aneurysm CKD 3 arthritis asbestosis COPD prior BPH--seen by [name] for kidney stones in 2012 and KUB 2023 Prior admission 12/01/2018 with GI bleed aspirin was held patient did not have colonoscopy   Recent surgery 04/07/2022 under [name] cystoscopy and bladder irrigation  Developed dark unformed stool 04/18/2022 Given Protonix Kcentra 2 units of blood Found to be in A-fib with RVR started on Cardizem  CTA showed active extravasation into the stomach   Hospital-Problem based course  GI bleed?  Diverticulosis - CTA showed active extravasation, IR performed mesenteric angiogram and embolization descending duodenum - Patient had further drop in hemoglobin and dark stool per rectum but repeat CTA performed 1/5 showed no active extravasation - Protonix no woral twice daily - Diet to be graduated as per GI - Family does not wish to pursue EGD - Hold Eliquis for 7 days until 1/50   Anemia of acute blood loss - Transfuse 1 unit of blood 1/3, transfused 2 more units 1/6 because of drop in hemoglobin -Hemodynamically stable from bleeding perspective  A-fib RVR from PTA on Eliquis - RVR is resolved but patient became slightly hypotensive on 1/5 - 1/6 Meds adjusted to Cardizem CD 180, discontinued amiodarone because of narrow therapeutic index - 1/7 extra dose of Toprol 12.5 now and then changed to 25 twice daily for 1/8 - Heart rate is moderately well-controlled  AKI prior to admission superimposed on CKD 3 AA Mild hyperkalemia - NS discontinued 04/25/2022 - Azotemia is resolved   Previous CVA and cerebral aneurysm - Cannot give aspirin in current setting - Eliquis on hold, resume as above   BPH - Continue Proscar 5 and  Flomax 0.4 - Foley  To be replaced on 1/8 PM so therefore would keep in place  Asbestosis?  COPD - Not on inhalers seems stable  DVT prophylaxis: Contraindicated because of bleed Code Status: Confirmed full Family Communication: No family present this morning Disposition:  Status is: Inpatient Remains inpatient appropriate because:   GI bleed A-fib RVR Going to telemetry floor will need input from therapy services    Subjective:  Patient seen alone, history limited by very poor hearing No overt stools overnight Foley catheter replaced   Objective: Vitals:   04/25/22 0500 04/25/22 0600 04/25/22 0700 04/25/22 0800  BP: (!) 162/64 (!) 149/65 (!) 138/92   Pulse: 83 (!) 35 93   Resp: '13 16 13   '$ Temp:    97.6 F (36.4 C)  TempSrc:    Axillary  SpO2: 93% 96% 93%   Weight:      Height:        Intake/Output Summary (Last 24 hours) at 04/25/2022 0913 Last data filed at 04/25/2022 0600 Gross per 24 hour  Intake 1808.96 ml  Output 2225 ml  Net -416.04 ml    Filed Weights   04/20/22 1505 04/20/22 2100  Weight: 64.4 kg 65 kg    Examination:  Very hard of hearing Chest clear no wheezes rales rhonchi Abdomen is soft no rebound no guarding ROM is intact S1-S2 no murmur seems to be in A-fib but rate controlled  Data Reviewed: personally reviewed   CBC    Component Value Date/Time   WBC 3.9 (L) 04/25/2022 0310   RBC 3.25 (L) 04/25/2022 0310  HGB 10.4 (L) 04/25/2022 0310   HGB 15.0 07/12/2019 1224   HCT 32.7 (L) 04/25/2022 0310   HCT 43.7 07/12/2019 1224   PLT 151 04/25/2022 0310   PLT 122 (L) 07/12/2019 1224   MCV 100.6 (H) 04/25/2022 0310   MCV 95 07/12/2019 1224   MCH 32.0 04/25/2022 0310   MCHC 31.8 04/25/2022 0310   RDW 16.1 (H) 04/25/2022 0310   RDW 13.3 07/12/2019 1224   LYMPHSABS 0.6 (L) 04/25/2022 0310   MONOABS 0.3 04/25/2022 0310   EOSABS 0.1 04/25/2022 0310   BASOSABS 0.0 04/25/2022 0310      Latest Ref Rng & Units 04/24/2022    3:38 AM  04/23/2022    2:59 AM 04/22/2022    5:40 AM  CMP  Glucose 70 - 99 mg/dL 90  97  120   BUN 8 - 23 mg/dL 20  29  35   Creatinine 0.61 - 1.24 mg/dL 1.15  1.42  1.32   Sodium 135 - 145 mmol/L 136  139  137   Potassium 3.5 - 5.1 mmol/L 3.8  3.9  4.1   Chloride 98 - 111 mmol/L 114  114  112   CO2 22 - 32 mmol/L '17  18  19   '$ Calcium 8.9 - 10.3 mg/dL 7.7  7.7  7.7   Total Protein 6.5 - 8.1 g/dL 4.9  4.7  4.8   Total Bilirubin 0.3 - 1.2 mg/dL 1.3  1.0  0.8   Alkaline Phos 38 - 126 U/L 46  45  41   AST 15 - 41 U/L '13  14  13   '$ ALT 0 - 44 U/L '13  13  12      '$ Radiology Studies: No results found.   Scheduled Meds:  Chlorhexidine Gluconate Cloth  6 each Topical Daily   diltiazem  180 mg Oral Daily   finasteride  5 mg Oral QHS   metoprolol succinate  12.5 mg Oral Once   metoprolol tartrate  25 mg Oral BID   mupirocin ointment  1 Application Nasal BID   pantoprazole  40 mg Oral BID AC   tamsulosin  0.4 mg Oral Daily   Continuous Infusions:  sodium chloride 75 mL/hr at 04/25/22 0600     LOS: 5 days   Time spent: Bassett, MD [facility] To contact the attending provider between 7A-7P or the covering provider during after hours 7P-7A, please log into the web site [email or web address] and access using universal [place] password for that web site. If you do not have the password, please call the hospital operator.  04/25/2022, 9:13 AM

## 2022-04-26 ENCOUNTER — Other Ambulatory Visit (HOSPITAL_COMMUNITY): Payer: Self-pay

## 2022-04-26 DIAGNOSIS — T39395A Adverse effect of other nonsteroidal anti-inflammatory drugs [NSAID], initial encounter: Secondary | ICD-10-CM | POA: Diagnosis not present

## 2022-04-26 DIAGNOSIS — K922 Gastrointestinal hemorrhage, unspecified: Secondary | ICD-10-CM | POA: Diagnosis not present

## 2022-04-26 LAB — BASIC METABOLIC PANEL
Anion gap: 5 (ref 5–15)
BUN: 14 mg/dL (ref 8–23)
CO2: 20 mmol/L — ABNORMAL LOW (ref 22–32)
Calcium: 8 mg/dL — ABNORMAL LOW (ref 8.9–10.3)
Chloride: 111 mmol/L (ref 98–111)
Creatinine, Ser: 1.15 mg/dL (ref 0.61–1.24)
GFR, Estimated: 59 mL/min — ABNORMAL LOW (ref >60)
Glucose, Bld: 97 mg/dL (ref 70–99)
Potassium: 3.9 mmol/L (ref 3.5–5.1)
Sodium: 136 mmol/L (ref 135–145)

## 2022-04-26 LAB — CBC
HCT: 31.9 % — ABNORMAL LOW (ref 39.0–52.0)
Hemoglobin: 10.5 g/dL — ABNORMAL LOW (ref 13.0–17.0)
MCH: 32.3 pg (ref 26.0–34.0)
MCHC: 32.9 g/dL (ref 30.0–36.0)
MCV: 98.2 fL (ref 80.0–100.0)
Platelets: 169 10*3/uL (ref 150–400)
RBC: 3.25 MIL/uL — ABNORMAL LOW (ref 4.22–5.81)
RDW: 15.9 % — ABNORMAL HIGH (ref 11.5–15.5)
WBC: 4.3 10*3/uL (ref 4.0–10.5)
nRBC: 0 % (ref 0.0–0.2)

## 2022-04-26 MED ORDER — METOPROLOL TARTRATE 25 MG PO TABS
25.0000 mg | ORAL_TABLET | Freq: Two times a day (BID) | ORAL | 0 refills | Status: DC
  Filled 2022-04-26: qty 30, 15d supply, fill #0

## 2022-04-26 MED ORDER — DILTIAZEM HCL ER COATED BEADS 180 MG PO CP24
180.0000 mg | ORAL_CAPSULE | Freq: Every day | ORAL | 1 refills | Status: DC
  Filled 2022-04-26: qty 30, 30d supply, fill #0
  Filled 2022-05-26 (×2): qty 30, 30d supply, fill #1

## 2022-04-26 MED ORDER — APIXABAN 2.5 MG PO TABS: 2.5000 mg | ORAL_TABLET | Freq: Two times a day (BID) | ORAL | 11 refills | Status: DC

## 2022-04-26 MED ORDER — PANTOPRAZOLE SODIUM 40 MG PO TBEC
40.0000 mg | DELAYED_RELEASE_TABLET | Freq: Two times a day (BID) | ORAL | 1 refills | Status: DC
  Filled 2022-04-26: qty 60, 30d supply, fill #0
  Filled 2022-05-26 (×2): qty 60, 30d supply, fill #1

## 2022-04-26 NOTE — TOC Transition Note (Signed)
Transition of Care [facility]) - CM/SW Discharge Note   Patient Details  Name: Richard Reed MRN: [id number] Date of Birth: Mar 10, 1926  Transition of Care [facility]) CM/SW Contact:  Leeroy Cha, RN Phone Number: 04/26/2022, 9:50 AM   Clinical Narrative:    Patient discharged to return home with self care.   Final next level of care: Home/Self Care Barriers to Discharge: Barriers Resolved   Patient Goals and CMS Choice   Choice offered to / list presented to : Spouse  Discharge Placement                         Discharge Plan and Services Additional resources added to the After Visit Summary for     Discharge Planning Services: CM Consult                                 Social Determinants of Health (SDOH) Interventions SDOH Screenings   Food Insecurity: No Food Insecurity (04/21/2022)  Housing: Low Risk  (04/21/2022)  Transportation Needs: No Transportation Needs (04/21/2022)  Utilities: Not At Risk (04/21/2022)  Depression (PHQ2-9): Low Risk  (02/09/2019)  Tobacco Use: Low Risk  (04/21/2022)     Readmission Risk Interventions   No data to display

## 2022-04-26 NOTE — Discharge Summary (Signed)
Physician Discharge Summary  Richard Reed NFA:[id number] DOB: October 08, 1925 DOA: 04/20/2022  PCP: Richard Loader, FNP  Admit date: 04/20/2022 Discharge date: 04/26/2022  Time spent: 33 minutes  Recommendations for Outpatient Follow-up:  Will need lab work in about 7 to 10 days to check hemoglobin and chemistries Should hold Eliquis until date indicated on discharge MAR, new start Protonix 40 twice daily Resume suppressive therapy for frequent urinary infections - May need to keep Foley on discharge depending on bladder scan and voiding trial May be able to resume aspirin on follow-up as long as hemoglobin is stable  Discharge Diagnoses:  MAIN problem for hospitalization   Acute GI bleed,  A-fib RVR,  BPH and urinary retention Prior CVA and aneurysm of right MCA Acute blood loss anemia from bleeding  Please see below for itemized issues addressed in HOpsital- refer to other progress notes for clarity if needed  Discharge Condition: Improved  Diet recommendation: Regular  Filed Weights   04/20/22 1505 04/20/22 2100  Weight: 64.4 kg 65 kg    History of present illness:  89 year old white male known history of paroxysmal atrial fibrillation on anticoagulation diverticulosis previous CVA with right MCA aneurysm CKD 3 arthritis asbestosis COPD prior BPH--seen by [name] for kidney stones in 2012 and KUB 2023 Prior admission 12/01/2018 with GI bleed aspirin was held patient did not have colonoscopy   Recent surgery 04/07/2022 under [name] cystoscopy and bladder irrigation   Developed dark unformed stool 04/18/2022 Given Protonix Kcentra 2 units of blood Found to be in A-fib with RVR started on Cardizem   CTA showed active extravasation into the stomach Hospitalization complicated by rebleed and A-fib RVR which was ultimately controlled and meds were added  Hospital Course:  GI bleed?  Diverticulosis - CTA showed active extravasation, IR performed mesenteric angiogram and  embolization descending duodenum - Patient had further drop in hemoglobin and dark stool per rectum but repeat CTA performed 1/5 showed no active extravasation - Protonix on discharge twice a day for 60 days at least - Patient was graduated to regular diet on 1/8 - Family does not wish to pursue EGD-suggest outpatient discussion about this - Hold Eliquis for 7 days until 1/15   Anemia of acute blood loss - Transfuse 1 unit of blood 1/3, transfused 2 more units 1/6 because of drop in hemoglobin -Hemodynamically stable from bleeding perspective   A-fib RVR from PTA on Eliquis - RVR is resolved but patient became slightly hypotensive on 1/5 - 1/6 Meds adjusted to Cardizem CD 180, discontinued amiodarone because of narrow therapeutic index - Meds were adjusted during the course of hospital stay and ultimately was changed to metoprolol 25 twice daily and can go home on this and Cardizem - Heart rate is moderately well-controlled   AKI prior to admission superimposed on CKD 3 AA Mild hyperkalemia - NS discontinued 04/25/2022 - Azotemia is resolved   Previous CVA and cerebral aneurysm - Cannot give aspirin in current setting-suggest outpatient reeval - Eliquis on hold, resume as above   BPH - Continue Proscar 5 and Flomax 0.4 - Foley  To be replaced on 1/8 PM so therefore would keep in place -May need leg bag and teaching and Foley catheter on discharge   Asbestosis?  COPD - Not on inhalers seems stable   Discharge Exam: Vitals:   04/26/22 0600 04/26/22 0800  BP: (!) 100/51   Pulse: 86   Resp: 15   Temp:  98.9 F (37.2 C)  SpO2:  96%     Subj on day of d/c   Awake coherent no distress looks comfortable Eating breakfast No dark stool no tarry stool CTAB no added so Slight tachycardia on monitors but has not received meds   Discharge Instructions    Allergies as of 04/26/2022   No Known Allergies      Medication List     STOP taking these medications     cephALEXin 500 MG capsule Commonly known as: KEFLEX   ciprofloxacin 500 MG tablet Commonly known as: Cipro   fluorouracil 5 % cream Commonly known as: Efudex   furosemide 20 MG tablet Commonly known as: LASIX   metoprolol succinate 25 MG 24 hr tablet Commonly known as: Toprol XL   sulfamethoxazole-trimethoprim 400-80 MG tablet Commonly known as: BACTRIM       TAKE these medications    apixaban 2.5 MG Tabs tablet Commonly known as: ELIQUIS Take 1 tablet (2.5 mg total) by mouth 2 (two) times daily. Start taking on: May 01, 2022 What changed: These instructions start on May 01, 2022. If you are unsure what to do until then, ask your doctor or other care provider.   diltiazem 180 MG 24 hr capsule Commonly known as: CARDIZEM CD Take 1 capsule (180 mg total) by mouth daily.   feeding supplement Liqd Take 237 mLs by mouth 2 (two) times daily between meals. What changed: when to take this   finasteride 5 MG tablet Commonly known as: PROSCAR Take 1 tablet (5 mg total) by mouth daily. What changed: Another medication with the same name was removed. Continue taking this medication, and follow the directions you see here.   HYDROcodone-acetaminophen 5-325 MG tablet Commonly known as: NORCO/VICODIN Take 1 tablet by mouth every 4 (four) hours as needed for moderate pain.   metoprolol tartrate 25 MG tablet Commonly known as: LOPRESSOR Take 1 tablet (25 mg total) by mouth 2 (two) times daily.   pantoprazole 40 MG tablet Commonly known as: PROTONIX Take 1 tablet (40 mg total) by mouth 2 (two) times daily before a meal.   PreserVision AREDS 2 Caps Take 1 capsule by mouth in the morning and at bedtime. What changed: Another medication with the same name was removed. Continue taking this medication, and follow the directions you see here.   rosuvastatin 10 MG tablet Commonly known as: CRESTOR TAKE 1 TABLET BY MOUTH DAILY. What changed:  how much to take when to  take this   SUPER B COMPLEX PO Take 1 tablet by mouth daily.   SYSTANE BALANCE OP Place 1 drop into both eyes 2 (two) times daily as needed (for dryness).   tamsulosin 0.4 MG Caps capsule Commonly known as: FLOMAX Take 1 capsule (0.4 mg total) by mouth nightly at bedtime. What changed: Another medication with the same name was removed. Continue taking this medication, and follow the directions you see here.   trimethoprim 100 MG tablet Commonly known as: TRIMPEX Take 1/2 tablet (50 mg total) by mouth at bedtime once current antibiotic is finished.       No Known Allergies    The results of significant diagnostics from this hospitalization (including imaging, microbiology, ancillary and laboratory) are listed below for reference.    Significant Diagnostic Studies: CT ANGIO GI BLEED  Result Date: 04/22/2022 CLINICAL DATA:  Recent cystoscopy and bladder irrigation. Lower GI bleeding. GDA active bleeding. EXAM: CTA ABDOMEN AND PELVIS WITHOUT AND WITH CONTRAST TECHNIQUE: Multidetector CT imaging of the abdomen and pelvis was performed  using the standard protocol during bolus administration of intravenous contrast. Multiplanar reconstructed images and MIPs were obtained and reviewed to evaluate the vascular anatomy. RADIATION DOSE REDUCTION: This exam was performed according to the departmental dose-optimization program which includes automated exposure control, adjustment of the mA and/or kV according to patient size and/or use of iterative reconstruction technique. CONTRAST:  155m OMNIPAQUE IOHEXOL 350 MG/ML SOLN COMPARISON:  CTA GI bleed 04/20/2022 FINDINGS: VASCULAR Aorta: Normal caliber aorta without aneurysm, dissection, vasculitis or significant stenosis. There is moderate atherosclerotic disease throughout the aorta similar to prior. Celiac: Patent without evidence of aneurysm, dissection, vasculitis or significant stenosis. There are new embolic coils in the region of the  gastroduodenal artery. SMA: Patent without evidence of aneurysm, dissection, vasculitis or significant stenosis. Renals: Both renal arteries are patent without evidence of aneurysm, dissection, vasculitis, fibromuscular dysplasia or significant stenosis. Accessory right renal artery again noted. IMA: Patent without evidence of aneurysm, dissection, vasculitis or significant stenosis. Inflow: Patent without evidence of aneurysm, dissection, vasculitis or significant stenosis. There is calcified atherosclerotic disease, unchanged. Proximal Outflow: Bilateral common femoral and visualized portions of the superficial and profunda femoral arteries are patent without evidence of aneurysm, dissection, vasculitis or significant stenosis. Veins: Within normal limits. Note is made that there is poor opacification of the IVC and venous structures due to timing of the contrast bolus. Review of the MIP images confirms the above findings. NON-VASCULAR Lower chest: Are small bilateral pleural effusions which have mildly increased. There is atelectasis in the lung bases. Hepatobiliary: Small arterial enhancing area seen in the left lobe of the liver measuring 8 mm image 5/50. This is not definitely seen on prior, possibly a flash fill hemangioma. No other liver lesions are seen. The gallbladder is surgically absent. There is no biliary ductal dilatation. Status post cholecystectomy. No biliary dilatation. Pancreas: Unremarkable. No pancreatic ductal dilatation or surrounding inflammatory changes. Spleen: Normal in size without focal abnormality. Adrenals/Urinary Tract: There is no hydronephrosis or perinephric fluid collection. There is bilateral renal cortical scarring. Bilateral ureteral stents are in place. Left peripelvic cysts appear unchanged. Right renal cortical cyst, mildly complex, measures less than 1 cm and is unchanged. The adrenal glands are within normal limits. The bladder is decompressed by Foley catheter.  Moderate-sized hiatal hernia is unchanged. Appendix appears normal. No evidence of bowel wall thickening, distention, or inflammatory changes. There is no evidence for active gastrointestinal bleeding. There is colonic diverticulosis without evidence for acute diverticulitis. The appendix is not seen. Lymphatic: No enlarged lymph nodes are identified. Reproductive: Prostate radiotherapy seeds are present. Other: There is a small fat containing left inguinal hernia. Musculoskeletal: The bones are osteopenic. Multilevel degenerative changes affect the spine. Mild chronic compression deformity of T10 appears unchanged. Grade 1 anterolisthesis at L4-L5 appears unchanged. IMPRESSION: 1. No evidence for active gastrointestinal bleeding. 2. New embolic coils in the region of the gastroduodenal artery. Aortic Atherosclerosis (ICD10-I70.0). NON-VASCULAR 1. No acute localizing process in the abdomen or pelvis. 2. Small bilateral pleural effusions have mildly increased in size. 3. Bilateral ureteral stents in place. No hydronephrosis. 4. Colonic diverticulosis without evidence for acute diverticulitis. 5. Stable moderate-sized hiatal hernia. 6. Small arterial enhancing lesion in the left lobe of the liver, not definitely seen on prior, possibly a flash fill hemangioma. Electronically Signed   By: ARonney AstersM.D.   On: 04/22/2022 19:02   IR EMBO ART  VEN HEMORR LYMPH EXTRAV  INC GUIDE ROADMAPPING  Result Date: 04/21/2022 INDICATION: Acute GI bleeding. Preceding CTA  demonstrate extravasation of contrast into the descending portion of the duodenum. Given findings on preceding CTA as well as poor endoscopy candidacy due to advanced age and comorbidities, please perform mesenteric arteriogram and potential percutaneous embolization. EXAM: 1. ULTRASOUND GUIDANCE FOR ARTERIAL ACCESS 2. SELECTIVE CELIAC ARTERIOGRAM 3. SELECTIVE COMMON HEPATIC ARTERIOGRAM 4. SELECTIVE GASTRODUODENAL ARTERIOGRAM AND PERCUTANEOUS COIL EMBOLIZATION  COMPARISON:  CT abdomen pelvis-04/20/2022 MEDICATIONS: None ANESTHESIA/SEDATION: None CONTRAST:  75 cc Omnipaque 300 FLUOROSCOPY TIME:  13 minutes, 18 seconds (242 mGy) COMPLICATIONS: None immediate. PROCEDURE: Informed consent was obtained from the patient, the patient's wife and daughter following explanation of the procedure, risks, benefits and alternatives. All questions were addressed. A time out was performed prior to the initiation of the procedure. Maximal barrier sterile technique utilized including caps, mask, sterile gowns, sterile gloves, large sterile drape, hand hygiene, and Betadine prep. The right femoral head was marked fluoroscopically. Under sterile conditions and local anesthesia, the right common femoral artery access was performed with a micropuncture needle. Under direct ultrasound guidance, the right common femoral was accessed with a micropuncture kit. An ultrasound image was saved for documentation purposes. This allowed for placement of a 5-French vascular sheath. A limited arteriogram was performed through the side arm of the sheath confirming appropriate access within the right common femoral artery. Over a Bentson wire, a Mickelson catheter was advanced the caudal aspect of the thoracic aorta where was reformed, back bled and flushed. The Mickelson catheter was then utilized to select the celiac artery and a selective celiac arteriogram was performed With the use of a fathom 14 microwire, a regular Renegade microcatheter was utilized to select the gastroduodenal artery and a selective gastroduodenal arteriogram was performed Next, the GDA was percutaneously coil embolized with multiple overlapping 3 mm, 4 mm, 5 mm and 6 mm interlock coils two near the vessel's origin. Completion selective common hepatic arteriogram demonstrates no residual flow through the GDA. At this point, the procedure was terminated. All wires, catheters and sheaths were removed from the patient. Hemostasis was  achieved at the right groin access site with manual compression. The patient tolerated the procedure well without immediate post procedural complication. FINDINGS: Selective celiac arteriogram demonstrates the common hepatic artery ending in a bifurcation of the GDA and left hepatic artery with a replaced right hepatic artery noted to arise from the proximal SMA on preceding CTA. There is mild irregularity and vasospasm of the GDA without discrete area of contrast extravasation. Given location of active extravasation with the descending portion of the duodenum on preceding CTA, prophylactic percutaneous coil embolization of the GDA was performed Post embolization common hepatic arteriogram demonstrates no residual flow through the GDA vascular territory. IMPRESSION: Technically successful prophylactic percutaneous coil embolization of the GDA for active extravasation within the descending portion of the duodenum demonstrated on preceding CTA. PLAN: - The patient is to remain flat for 4 hours with right leg straight. - The patient will continue to experience several additional bloody bowel movements and may continue to require additional resuscitation (as he was bleeding both before and during the procedure), however ultimately I am hopeful he will stabilize in the coming days. - While the patient's bleeding is presumably secondary to the presence of a duodenal ulcer, further evaluation with endoscopy could be performed as indicated. Electronically Signed   By: Sandi Mariscal M.D.   On: 04/21/2022 10:46   IR Angiogram Visceral Selective  Result Date: 04/21/2022 INDICATION: Acute GI bleeding. Preceding CTA demonstrate extravasation of contrast into the descending portion  of the duodenum. Given findings on preceding CTA as well as poor endoscopy candidacy due to advanced age and comorbidities, please perform mesenteric arteriogram and potential percutaneous embolization. EXAM: 1. ULTRASOUND GUIDANCE FOR ARTERIAL ACCESS  2. SELECTIVE CELIAC ARTERIOGRAM 3. SELECTIVE COMMON HEPATIC ARTERIOGRAM 4. SELECTIVE GASTRODUODENAL ARTERIOGRAM AND PERCUTANEOUS COIL EMBOLIZATION COMPARISON:  CT abdomen pelvis-04/20/2022 MEDICATIONS: None ANESTHESIA/SEDATION: None CONTRAST:  75 cc Omnipaque 300 FLUOROSCOPY TIME:  13 minutes, 18 seconds (937 mGy) COMPLICATIONS: None immediate. PROCEDURE: Informed consent was obtained from the patient, the patient's wife and daughter following explanation of the procedure, risks, benefits and alternatives. All questions were addressed. A time out was performed prior to the initiation of the procedure. Maximal barrier sterile technique utilized including caps, mask, sterile gowns, sterile gloves, large sterile drape, hand hygiene, and Betadine prep. The right femoral head was marked fluoroscopically. Under sterile conditions and local anesthesia, the right common femoral artery access was performed with a micropuncture needle. Under direct ultrasound guidance, the right common femoral was accessed with a micropuncture kit. An ultrasound image was saved for documentation purposes. This allowed for placement of a 5-French vascular sheath. A limited arteriogram was performed through the side arm of the sheath confirming appropriate access within the right common femoral artery. Over a Bentson wire, a Mickelson catheter was advanced the caudal aspect of the thoracic aorta where was reformed, back bled and flushed. The Mickelson catheter was then utilized to select the celiac artery and a selective celiac arteriogram was performed With the use of a fathom 14 microwire, a regular Renegade microcatheter was utilized to select the gastroduodenal artery and a selective gastroduodenal arteriogram was performed Next, the GDA was percutaneously coil embolized with multiple overlapping 3 mm, 4 mm, 5 mm and 6 mm interlock coils two near the vessel's origin. Completion selective common hepatic arteriogram demonstrates no residual  flow through the GDA. At this point, the procedure was terminated. All wires, catheters and sheaths were removed from the patient. Hemostasis was achieved at the right groin access site with manual compression. The patient tolerated the procedure well without immediate post procedural complication. FINDINGS: Selective celiac arteriogram demonstrates the common hepatic artery ending in a bifurcation of the GDA and left hepatic artery with a replaced right hepatic artery noted to arise from the proximal SMA on preceding CTA. There is mild irregularity and vasospasm of the GDA without discrete area of contrast extravasation. Given location of active extravasation with the descending portion of the duodenum on preceding CTA, prophylactic percutaneous coil embolization of the GDA was performed Post embolization common hepatic arteriogram demonstrates no residual flow through the GDA vascular territory. IMPRESSION: Technically successful prophylactic percutaneous coil embolization of the GDA for active extravasation within the descending portion of the duodenum demonstrated on preceding CTA. PLAN: - The patient is to remain flat for 4 hours with right leg straight. - The patient will continue to experience several additional bloody bowel movements and may continue to require additional resuscitation (as he was bleeding both before and during the procedure), however ultimately I am hopeful he will stabilize in the coming days. - While the patient's bleeding is presumably secondary to the presence of a duodenal ulcer, further evaluation with endoscopy could be performed as indicated. Electronically Signed   By: Sandi Mariscal M.D.   On: 04/21/2022 10:22   IR US Guide Vasc Access Right  Result Date: 04/21/2022 INDICATION: Acute GI bleeding. Preceding CTA demonstrate extravasation of contrast into the descending portion of the duodenum. Given findings on  preceding CTA as well as poor endoscopy candidacy due to advanced age  and comorbidities, please perform mesenteric arteriogram and potential percutaneous embolization. EXAM: 1. ULTRASOUND GUIDANCE FOR ARTERIAL ACCESS 2. SELECTIVE CELIAC ARTERIOGRAM 3. SELECTIVE COMMON HEPATIC ARTERIOGRAM 4. SELECTIVE GASTRODUODENAL ARTERIOGRAM AND PERCUTANEOUS COIL EMBOLIZATION COMPARISON:  CT abdomen pelvis-04/20/2022 MEDICATIONS: None ANESTHESIA/SEDATION: None CONTRAST:  75 cc Omnipaque 300 FLUOROSCOPY TIME:  13 minutes, 18 seconds (098 mGy) COMPLICATIONS: None immediate. PROCEDURE: Informed consent was obtained from the patient, the patient's wife and daughter following explanation of the procedure, risks, benefits and alternatives. All questions were addressed. A time out was performed prior to the initiation of the procedure. Maximal barrier sterile technique utilized including caps, mask, sterile gowns, sterile gloves, large sterile drape, hand hygiene, and Betadine prep. The right femoral head was marked fluoroscopically. Under sterile conditions and local anesthesia, the right common femoral artery access was performed with a micropuncture needle. Under direct ultrasound guidance, the right common femoral was accessed with a micropuncture kit. An ultrasound image was saved for documentation purposes. This allowed for placement of a 5-French vascular sheath. A limited arteriogram was performed through the side arm of the sheath confirming appropriate access within the right common femoral artery. Over a Bentson wire, a Mickelson catheter was advanced the caudal aspect of the thoracic aorta where was reformed, back bled and flushed. The Mickelson catheter was then utilized to select the celiac artery and a selective celiac arteriogram was performed With the use of a fathom 14 microwire, a regular Renegade microcatheter was utilized to select the gastroduodenal artery and a selective gastroduodenal arteriogram was performed Next, the GDA was percutaneously coil embolized with multiple overlapping  3 mm, 4 mm, 5 mm and 6 mm interlock coils two near the vessel's origin. Completion selective common hepatic arteriogram demonstrates no residual flow through the GDA. At this point, the procedure was terminated. All wires, catheters and sheaths were removed from the patient. Hemostasis was achieved at the right groin access site with manual compression. The patient tolerated the procedure well without immediate post procedural complication. FINDINGS: Selective celiac arteriogram demonstrates the common hepatic artery ending in a bifurcation of the GDA and left hepatic artery with a replaced right hepatic artery noted to arise from the proximal SMA on preceding CTA. There is mild irregularity and vasospasm of the GDA without discrete area of contrast extravasation. Given location of active extravasation with the descending portion of the duodenum on preceding CTA, prophylactic percutaneous coil embolization of the GDA was performed Post embolization common hepatic arteriogram demonstrates no residual flow through the GDA vascular territory. IMPRESSION: Technically successful prophylactic percutaneous coil embolization of the GDA for active extravasation within the descending portion of the duodenum demonstrated on preceding CTA. PLAN: - The patient is to remain flat for 4 hours with right leg straight. - The patient will continue to experience several additional bloody bowel movements and may continue to require additional resuscitation (as he was bleeding both before and during the procedure), however ultimately I am hopeful he will stabilize in the coming days. - While the patient's bleeding is presumably secondary to the presence of a duodenal ulcer, further evaluation with endoscopy could be performed as indicated. Electronically Signed   By: Sandi Mariscal M.D.   On: 04/21/2022 10:22   IR Angiogram Selective Each Additional Vessel  Result Date: 04/21/2022 INDICATION: Acute GI bleeding. Preceding CTA demonstrate  extravasation of contrast into the descending portion of the duodenum. Given findings on preceding CTA as well as  poor endoscopy candidacy due to advanced age and comorbidities, please perform mesenteric arteriogram and potential percutaneous embolization. EXAM: 1. ULTRASOUND GUIDANCE FOR ARTERIAL ACCESS 2. SELECTIVE CELIAC ARTERIOGRAM 3. SELECTIVE COMMON HEPATIC ARTERIOGRAM 4. SELECTIVE GASTRODUODENAL ARTERIOGRAM AND PERCUTANEOUS COIL EMBOLIZATION COMPARISON:  CT abdomen pelvis-04/20/2022 MEDICATIONS: None ANESTHESIA/SEDATION: None CONTRAST:  75 cc Omnipaque 300 FLUOROSCOPY TIME:  13 minutes, 18 seconds (355 mGy) COMPLICATIONS: None immediate. PROCEDURE: Informed consent was obtained from the patient, the patient's wife and daughter following explanation of the procedure, risks, benefits and alternatives. All questions were addressed. A time out was performed prior to the initiation of the procedure. Maximal barrier sterile technique utilized including caps, mask, sterile gowns, sterile gloves, large sterile drape, hand hygiene, and Betadine prep. The right femoral head was marked fluoroscopically. Under sterile conditions and local anesthesia, the right common femoral artery access was performed with a micropuncture needle. Under direct ultrasound guidance, the right common femoral was accessed with a micropuncture kit. An ultrasound image was saved for documentation purposes. This allowed for placement of a 5-French vascular sheath. A limited arteriogram was performed through the side arm of the sheath confirming appropriate access within the right common femoral artery. Over a Bentson wire, a Mickelson catheter was advanced the caudal aspect of the thoracic aorta where was reformed, back bled and flushed. The Mickelson catheter was then utilized to select the celiac artery and a selective celiac arteriogram was performed With the use of a fathom 14 microwire, a regular Renegade microcatheter was utilized to  select the gastroduodenal artery and a selective gastroduodenal arteriogram was performed Next, the GDA was percutaneously coil embolized with multiple overlapping 3 mm, 4 mm, 5 mm and 6 mm interlock coils two near the vessel's origin. Completion selective common hepatic arteriogram demonstrates no residual flow through the GDA. At this point, the procedure was terminated. All wires, catheters and sheaths were removed from the patient. Hemostasis was achieved at the right groin access site with manual compression. The patient tolerated the procedure well without immediate post procedural complication. FINDINGS: Selective celiac arteriogram demonstrates the common hepatic artery ending in a bifurcation of the GDA and left hepatic artery with a replaced right hepatic artery noted to arise from the proximal SMA on preceding CTA. There is mild irregularity and vasospasm of the GDA without discrete area of contrast extravasation. Given location of active extravasation with the descending portion of the duodenum on preceding CTA, prophylactic percutaneous coil embolization of the GDA was performed Post embolization common hepatic arteriogram demonstrates no residual flow through the GDA vascular territory. IMPRESSION: Technically successful prophylactic percutaneous coil embolization of the GDA for active extravasation within the descending portion of the duodenum demonstrated on preceding CTA. PLAN: - The patient is to remain flat for 4 hours with right leg straight. - The patient will continue to experience several additional bloody bowel movements and may continue to require additional resuscitation (as he was bleeding both before and during the procedure), however ultimately I am hopeful he will stabilize in the coming days. - While the patient's bleeding is presumably secondary to the presence of a duodenal ulcer, further evaluation with endoscopy could be performed as indicated. Electronically Signed   By: Sandi Mariscal M.D.   On: 04/21/2022 10:22   IR Angiogram Selective Each Additional Vessel  Result Date: 04/21/2022 INDICATION: Acute GI bleeding. Preceding CTA demonstrate extravasation of contrast into the descending portion of the duodenum. Given findings on preceding CTA as well as poor endoscopy candidacy due to advanced  age and comorbidities, please perform mesenteric arteriogram and potential percutaneous embolization. EXAM: 1. ULTRASOUND GUIDANCE FOR ARTERIAL ACCESS 2. SELECTIVE CELIAC ARTERIOGRAM 3. SELECTIVE COMMON HEPATIC ARTERIOGRAM 4. SELECTIVE GASTRODUODENAL ARTERIOGRAM AND PERCUTANEOUS COIL EMBOLIZATION COMPARISON:  CT abdomen pelvis-04/20/2022 MEDICATIONS: None ANESTHESIA/SEDATION: None CONTRAST:  75 cc Omnipaque 300 FLUOROSCOPY TIME:  13 minutes, 18 seconds (841 mGy) COMPLICATIONS: None immediate. PROCEDURE: Informed consent was obtained from the patient, the patient's wife and daughter following explanation of the procedure, risks, benefits and alternatives. All questions were addressed. A time out was performed prior to the initiation of the procedure. Maximal barrier sterile technique utilized including caps, mask, sterile gowns, sterile gloves, large sterile drape, hand hygiene, and Betadine prep. The right femoral head was marked fluoroscopically. Under sterile conditions and local anesthesia, the right common femoral artery access was performed with a micropuncture needle. Under direct ultrasound guidance, the right common femoral was accessed with a micropuncture kit. An ultrasound image was saved for documentation purposes. This allowed for placement of a 5-French vascular sheath. A limited arteriogram was performed through the side arm of the sheath confirming appropriate access within the right common femoral artery. Over a Bentson wire, a Mickelson catheter was advanced the caudal aspect of the thoracic aorta where was reformed, back bled and flushed. The Mickelson catheter was then utilized to  select the celiac artery and a selective celiac arteriogram was performed With the use of a fathom 14 microwire, a regular Renegade microcatheter was utilized to select the gastroduodenal artery and a selective gastroduodenal arteriogram was performed Next, the GDA was percutaneously coil embolized with multiple overlapping 3 mm, 4 mm, 5 mm and 6 mm interlock coils two near the vessel's origin. Completion selective common hepatic arteriogram demonstrates no residual flow through the GDA. At this point, the procedure was terminated. All wires, catheters and sheaths were removed from the patient. Hemostasis was achieved at the right groin access site with manual compression. The patient tolerated the procedure well without immediate post procedural complication. FINDINGS: Selective celiac arteriogram demonstrates the common hepatic artery ending in a bifurcation of the GDA and left hepatic artery with a replaced right hepatic artery noted to arise from the proximal SMA on preceding CTA. There is mild irregularity and vasospasm of the GDA without discrete area of contrast extravasation. Given location of active extravasation with the descending portion of the duodenum on preceding CTA, prophylactic percutaneous coil embolization of the GDA was performed Post embolization common hepatic arteriogram demonstrates no residual flow through the GDA vascular territory. IMPRESSION: Technically successful prophylactic percutaneous coil embolization of the GDA for active extravasation within the descending portion of the duodenum demonstrated on preceding CTA. PLAN: - The patient is to remain flat for 4 hours with right leg straight. - The patient will continue to experience several additional bloody bowel movements and may continue to require additional resuscitation (as he was bleeding both before and during the procedure), however ultimately I am hopeful he will stabilize in the coming days. - While the patient's bleeding is  presumably secondary to the presence of a duodenal ulcer, further evaluation with endoscopy could be performed as indicated. Electronically Signed   By: Sandi Mariscal M.D.   On: 04/21/2022 10:22   CT ANGIO GI BLEED  Result Date: 04/20/2022 CLINICAL DATA:  Rectal bleeding. EXAM: CTA ABDOMEN AND PELVIS WITHOUT AND WITH CONTRAST TECHNIQUE: Multidetector CT imaging of the abdomen and pelvis was performed using the standard protocol during bolus administration of intravenous contrast. Multiplanar reconstructed images and  MIPs were obtained and reviewed to evaluate the vascular anatomy. RADIATION DOSE REDUCTION: This exam was performed according to the departmental dose-optimization program which includes automated exposure control, adjustment of the mA and/or kV according to patient size and/or use of iterative reconstruction technique. CONTRAST:  24m OMNIPAQUE IOHEXOL 350 MG/ML SOLN COMPARISON:  April 06, 2022. FINDINGS: VASCULAR Aorta: Atherosclerosis of abdominal aorta without aneurysm or dissection. Celiac: Patent without evidence of aneurysm, dissection, vasculitis or significant stenosis. SMA: Patent without evidence of aneurysm, dissection, vasculitis or significant stenosis. Renals: Bilateral renal arteries are patent without evidence of aneurysm, dissection, vasculitis, fibromuscular dysplasia or significant stenosis. IMA: Patent without evidence of aneurysm, dissection, vasculitis or significant stenosis. Inflow: Patent without evidence of aneurysm, dissection, vasculitis or significant stenosis. Proximal Outflow: Bilateral common femoral and visualized portions of the superficial and profunda femoral arteries are patent without evidence of aneurysm, dissection, vasculitis or significant stenosis. Veins: No obvious venous abnormality within the limitations of this arterial phase study. Review of the MIP images confirms the above findings. NON-VASCULAR Lower chest: No acute abnormality seen in visualized  lung bases. Moderate sized hiatal hernia is noted. Hepatobiliary: No focal liver abnormality is seen. Status post cholecystectomy. No biliary dilatation. Pancreas: Unremarkable. No pancreatic ductal dilatation or surrounding inflammatory changes. Spleen: Normal in size without focal abnormality. Adrenals/Urinary Tract: Adrenal glands appear normal. Bilateral renal cortical scarring is noted. Stable bilateral renal cysts are noted. Bilateral ureteral stents are noted in grossly good position. No hydronephrosis or renal obstruction is noted. Urinary bladder is decompressed. Stomach/Bowel: There appears to be active contrast extravasation involving the second portion the duodenum suggesting bleeding. There is no evidence of bowel obstruction. Extensive diverticulosis of sigmoid colon is noted without inflammation. Lymphatic: No definite adenopathy is noted. Reproductive: Status post prostatic brachytherapy seed placement. Other: No abdominal wall hernia or abnormality. No abdominopelvic ascites. Musculoskeletal: No acute or significant osseous findings. IMPRESSION: VASCULAR Small amount of focal contrast accumulation is noted in the second portion of the duodenum suggesting gastrointestinal bleeding. Critical Value/emergent results were called by telephone at the time of interpretation on 04/20/2022 at 5:30 pm to provider Dr. KTomi Bamberger who verbally acknowledged these results. Aortic Atherosclerosis (ICD10-I70.0). NON-VASCULAR Moderate size hiatal hernia. Sigmoid diverticulosis without inflammation. Bilateral ureteral stents are noted in grossly good position. No hydronephrosis or renal obstruction is noted. Bilateral renal cortical atrophy is noted. Status post prostatic brachytherapy seed placement. Electronically Signed   By: JMarijo ConceptionM.D.   On: 04/20/2022 17:30   DG C-Arm 1-60 Min-No Report  Result Date: 04/07/2022 Fluoroscopy was utilized by the requesting physician.  No radiographic interpretation.     Microbiology: Recent Results (from the past 240 hour(s))  MRSA Next Gen by PCR, Nasal     Status: Abnormal   Collection Time: 04/21/22 11:44 PM   Specimen: Nasal Mucosa; Nasal Swab  Result Value Ref Range Status   MRSA by PCR Next Gen DETECTED (A) NOT DETECTED Final    Comment: (NOTE) The GeneXpert MRSA Assay (FDA approved for NASAL specimens only), is one component of a comprehensive MRSA colonization surveillance program. It is not intended to diagnose MRSA infection nor to guide or monitor treatment for MRSA infections. Test performance is not FDA approved in patients less than 242years old. [facility]formed at [facility] [address] [place] [id number]     Labs: Basic Metabolic Panel: Recent Labs  Lab 04/21/22 [id number]/24 [id number] 04/22/22 0540 04/23/22 0259 04/24/22 [id number]/09/24 0253  NA  135  --  137 139 136 136  K 4.6  --  4.1 3.9 3.8 3.9  CL 110  --  112* 114* 114* 111  CO2 18*  --  19* 18* 17* 20*  GLUCOSE 125*  --  120* 97 90 97  BUN 47*  --  35* 29* 20 14  CREATININE 1.34*  --  1.32* 1.42* 1.15 1.15  CALCIUM 7.6*  --  7.7* 7.7* 7.7* 8.0*  MG 1.9 1.9 1.9  --   --   --    Liver Function Tests: Recent Labs  Lab 04/20/22 1441 04/21/22 0305 04/22/22 0540 04/23/22 0259 04/24/22 0338  AST 16 12* 13* 14* 13*  ALT '13 11 12 13 13  '$ ALKPHOS 55 38 41 45 46  BILITOT 0.6 0.9 0.8 1.0 1.3*  PROT 5.8* 4.5* 4.8* 4.7* 4.9*  ALBUMIN 3.2* 2.5* 2.7* 2.3* 2.4*   Recent Labs  Lab 04/20/22 1441  LIPASE 39   Recent Labs  Lab 04/20/22 1441  AMMONIA 12   CBC: Recent Labs  Lab 04/20/22 1441 04/20/22 1448 04/23/22 0259 04/23/22 1515 04/24/22 0338 04/25/22 0310 04/26/22 0253  WBC 7.4   < > 5.0 4.6 4.4 3.9* 4.3  NEUTROABS 6.5  --   --   --   --  2.9  --   HGB 9.8*   < > 9.9* 9.9* 10.3* 10.4* 10.5*  HCT 30.1*   < > 30.0* 30.7* 32.3* 32.7* 31.9*  MCV 100.0   < > 99.3 100.0 101.6* 100.6* 98.2  PLT 237   < > 146* 138* 146* 151 169   <  > = values in this interval not displayed.   Cardiac Enzymes: No results for input(s): "CKTOTAL", "CKMB", "CKMBINDEX", "TROPONINI" in the last 168 hours. BNP: BNP (last 3 results) No results for input(s): "BNP" in the last 8760 hours.  ProBNP (last 3 results) No results for input(s): "PROBNP" in the last 8760 hours.  CBG: No results for input(s): "GLUCAP" in the last 168 hours.     Signed:  Nita Sells MD   [facility] 04/26/2022, 9:30 AM

## 2022-04-28 ENCOUNTER — Ambulatory Visit: Payer: Medicare Other | Attending: Nurse Practitioner | Admitting: Nurse Practitioner

## 2022-04-28 ENCOUNTER — Encounter: Payer: Self-pay | Admitting: Nurse Practitioner

## 2022-04-28 ENCOUNTER — Telehealth: Payer: Self-pay | Admitting: Cardiovascular Disease

## 2022-04-28 DIAGNOSIS — Z01810 Encounter for preprocedural cardiovascular examination: Secondary | ICD-10-CM

## 2022-04-28 NOTE — Telephone Encounter (Signed)
See 04/12/22 encounter  Richard Reed with [facility] states anesthesia is requesting an updated clearance due to a GI bleed during recent ED admission. She states they need to know if there is a change in plan of care. Please advise.  Phone#: [phone] ext [phone]  Fax#: [phone]

## 2022-04-28 NOTE — Telephone Encounter (Signed)
  Patient Consent for Virtual Visit        Richard Reed has provided verbal consent on 04/28/2022 for a virtual visit (video or telephone).   CONSENT FOR VIRTUAL VISIT FOR:  Richard Reed  By participating in this virtual visit I agree to the following:  I hereby voluntarily request, consent and authorize [facility] and its employed or contracted physicians, physician assistants, nurse practitioners or other licensed health care professionals (the Practitioner), to provide me with telemedicine health care services (the "Services") as deemed necessary by the treating Practitioner. I acknowledge and consent to receive the Services by the Practitioner via telemedicine. I understand that the telemedicine visit will involve communicating with the Practitioner through live audiovisual communication technology and the disclosure of certain medical information by electronic transmission. I acknowledge that I have been given the opportunity to request an in-person assessment or other available alternative prior to the telemedicine visit and am voluntarily participating in the telemedicine visit.  I understand that I have the right to withhold or withdraw my consent to the use of telemedicine in the course of my care at any time, without affecting my right to future care or treatment, and that the Practitioner or I may terminate the telemedicine visit at any time. I understand that I have the right to inspect all information obtained and/or recorded in the course of the telemedicine visit and may receive copies of available information for a reasonable fee.  I understand that some of the potential risks of receiving the Services via telemedicine include:  Delay or interruption in medical evaluation due to technological equipment failure or disruption; Information transmitted may not be sufficient (e.g. poor resolution of images) to allow for appropriate medical decision making by the Practitioner;  and/or  In rare instances, security protocols could fail, causing a breach of personal health information.  Furthermore, I acknowledge that it is my responsibility to provide information about my medical history, conditions and care that is complete and accurate to the best of my ability. I acknowledge that Practitioner's advice, recommendations, and/or decision may be based on factors not within their control, such as incomplete or inaccurate data provided by me or distortions of diagnostic images or specimens that may result from electronic transmissions. I understand that the practice of medicine is not an exact science and that Practitioner makes no warranties or guarantees regarding treatment outcomes. I acknowledge that a copy of this consent can be made available to me via my patient portal ([facility]), or I can request a printed copy by calling the office of [facility].    I understand that my insurance will be billed for this visit.   I have read or had this consent read to me. I understand the contents of this consent, which adequately explains the benefits and risks of the Services being provided via telemedicine.  I have been provided ample opportunity to ask questions regarding this consent and the Services and have had my questions answered to my satisfaction. I give my informed consent for the services to be provided through the use of telemedicine in my medical care

## 2022-04-28 NOTE — Telephone Encounter (Signed)
Primary Cardiologist:Jonathan [name], MD   Preoperative team, please contact this patient and set up a phone call appointment for further preoperative risk assessment. Please obtain consent and complete medication review. Thank you for your help.   Eliquis is currently on hold until 05/02/22 due to GIB. Reached out to [facility], [facility] who advised patient may hold an additional day for upcoming procedure and should resume as directed by GI and requesting provider. He does not need bridging with Lovenox.   Emmaline Life, NP-C  04/28/2022, 12:34 PM [address] Office [phone] Fax [phone]

## 2022-04-28 NOTE — Telephone Encounter (Signed)
I will forward this to pre op pool for review and recommendations.

## 2022-04-28 NOTE — Progress Notes (Signed)
Virtual Visit via Telephone Note   Because of Richard Reed co-morbid illnesses, he is at least at moderate risk for complications without adequate follow up.  This format is felt to be most appropriate for this patient at this time.  The patient did not have access to video technology/had technical difficulties with video requiring transitioning to audio format only (telephone).  All issues noted in this document were discussed and addressed.  No physical exam could be performed with this format.  Please refer to the patient's chart for his consent to telehealth for [facility].  Evaluation Performed:  Preoperative cardiovascular risk assessment _____________   Date:  04/28/2022   Patient ID:  Richard Reed, DOB [birth year], MRN [id number] Patient Location:  Home Provider location:   Office  Primary Care Provider:  [name], FNP Primary Cardiologist:  Quay Burow, MD  Chief Complaint / Patient Profile   89 y.o. y/o male with a h/o CAD with 109 to 60% stenosis RCA, normal NST 03/2016,, PAF, moderate aortic insufficiency, chronic combined systolic and diastolic heart failure, carotid artery disease, cerebral aneurysm, HTN, HLD, CKD who is pending cystoscopy, bilateral ureteroscopy and stent placement and presents today for telephonic preoperative cardiovascular risk assessment.  History of Present Illness    Richard Reed is a 89 y.o. male who presents via audio/video conferencing for a telehealth visit today.  Pt was last seen in cardiology clinic on 01/12/22 by Dr. [name]. At that time Richard Reed was doing well. The patient is now pending procedure as outlined above. Since his last visit, he  was hospitalized for GIB and his Eliquis is currently on hold. He had an episode of AF with RVR during hospitalization. Since discharge, his daughters are staying with him. He is using his walker and moves around the house without difficulty. Has noted mild lower extremity  swelling since discharge but no orthopnea, PND. She denies that he is having chest pain, shortness of breath, fatigue, palpitations, melena, diaphoresis, weakness, presyncope, syncope, orthopnea, and PND.  Past Medical History    Past Medical History:  Diagnosis Date   Angina    Aortic valve insufficiency, moderate    a. 02/2016 Echo: mild AS, mild to mod AI.   Arthritis    Arthritis pain    CAD (coronary artery disease)    a. 09/2010 Cath: RCA 40-60 ost, otw nl cors;  b. 03/2016 Lexiscan MV: EF 46%, no ischemia, low risk.   Cancer [facility])    Carotid disease, bilateral (Richard Reed)    a. 02/2016 Carotid U/S: 1-39% bilat ICA plaquing.   Cerebral aneurysm    a. 02/2016 MRA: fusiform bilobed aneurysm @ R MCA bifurcation.   Cerebrovascular disease    a. 02/2016 MRA: long segment narrowing of basilar artery, moderate bilat posterior inferior cerebellar artery dzs.   Chronic combined systolic and diastolic CHF (congestive heart failure) ([place])    a. 02/2016 Admitted to hospital in [place] w/ CHF;  b. 02/2016 Echo ([facility]): EF 45-50%, diff HK, gr1DD, mild AS, mild to mod AI, mildly dil Ao root, PASP 72mHg.   CKD (chronic kidney disease), stage III (HCC)    Dysrhythmia    Essential hypertension    Hard of hearing    Hemiparesis and alteration of sensations as late effects of stroke ([place] 04/29/2015   Hiatal hernia    History of kidney stones    HOH (hard of hearing)    Hyperlipidemia    Kidney stones  Moderate aortic insufficiency    NICM (nonischemic cardiomyopathy) (Richard Reed)    a. 02/2016 Echo: EF 45-50%, diff HK;  b. 03/2016 Lexiscan MV: No ischemia, EF 46%.   NSVT (nonsustained ventricular tachycardia) (Richard Reed)    a. 03/2016 noted on event monitor.   PAF (paroxysmal atrial fibrillation) (Richard Reed)    a. 2012 - noted in setting of E coli bacteremia and septic shock.   Pneumonia 03/23/2011   "first time"   Prostate cancer (Richard Reed)    Sepsis due to Escherichia coli (Richard Reed) 03/24/11   Syncope    a. 02/2016  presumed to be 2/2 hypotension/dehydration and basilar artery dzs noted on MRA.   TIA (transient ischemic attack)    Vertebrobasilar insufficiency 04/28/2016   Visual disturbance 03/19/2014   Past Surgical History:  Procedure Laterality Date   CARDIAC CATHETERIZATION  09/2010   non obstructive CAD, 40-60% ostial RCA stenosis without dampening    CATARACT EXTRACTION Bilateral    cerebral angiography     CHOLECYSTECTOMY  04/15/2011   Procedure: LAPAROSCOPIC CHOLECYSTECTOMY WITH INTRAOPERATIVE CHOLANGIOGRAM;  Surgeon: Belva Crome, MD;  Location: [place];  Service: General;  Laterality: N/A;   CYSTOSCOPY WITH STENT PLACEMENT Bilateral 04/07/2022   Procedure: CYSTOSCOPY WITH BILATERAL STENT PLACEMENT;  Surgeon: [name], MD;  Location: [facility];  Service: Urology;  Laterality: Bilateral;  30 MINS FRO CASE   EXTRACORPOREAL SHOCK WAVE LITHOTRIPSY Left 12/12/2016   Procedure: LEFT EXTRACORPOREAL SHOCK WAVE LITHOTRIPSY (ESWL);  Surgeon: Ardis Hughs, MD;  Location: [facility];  Service: Urology;  Laterality: Left;   IR ANGIO INTRA EXTRACRAN SEL COM CAROTID INNOMINATE BILAT MOD SED  03/21/2019   IR ANGIO VERTEBRAL SEL SUBCLAVIAN INNOMINATE BILAT MOD SED  03/21/2019   IR ANGIOGRAM SELECTIVE EACH ADDITIONAL VESSEL  04/20/2022   IR ANGIOGRAM SELECTIVE EACH ADDITIONAL VESSEL  04/20/2022   IR ANGIOGRAM VISCERAL SELECTIVE  04/20/2022   IR EMBO ART  VEN HEMORR LYMPH EXTRAV  INC GUIDE ROADMAPPING  04/20/2022   IR GENERIC HISTORICAL  03/17/2016   IR ANGIO VERTEBRAL SEL SUBCLAVIAN INNOMINATE BILAT MOD SED 03/17/2016 [name], MD [facility]   IR GENERIC HISTORICAL  03/17/2016   IR ANGIO INTRA EXTRACRAN SEL COM CAROTID INNOMINATE BILAT MOD SED 03/17/2016 [name], MD [facility]   IR US GUIDE VASC ACCESS RIGHT  04/20/2022   PROSTATE SURGERY     approx 10 years ago, seed implant radiation   TONSILLECTOMY      Allergies  No Known Allergies  Home Medications    Prior to Admission  medications   Medication Sig Start Date End Date Taking? Authorizing Provider  apixaban (ELIQUIS) 2.5 MG TABS tablet Take 1 tablet (2.5 mg total) by mouth 2 (two) times daily. Patient not taking: Reported on 04/28/2022 05/01/22   Nita Sells, MD  B Complex-C (SUPER B COMPLEX PO) Take 1 tablet by mouth daily.    [provider]  diltiazem (CARDIZEM CD) 180 MG 24 hr capsule Take 1 capsule (180 mg total) by mouth daily. 04/26/22   Nita Sells, MD  feeding supplement, ENSURE ENLIVE, (ENSURE ENLIVE) LIQD Take 237 mLs by mouth 2 (two) times daily between meals. Patient taking differently: Take 237 mLs by mouth daily. 03/02/16   Geradine Girt, DO  finasteride (PROSCAR) 5 MG tablet Take 1 tablet (5 mg total) by mouth daily. 02/24/22     HYDROcodone-acetaminophen (NORCO/VICODIN) 5-325 MG tablet Take 1 tablet by mouth every 4 (four) hours as needed for moderate pain. Patient not taking:  Reported on 04/28/2022 04/07/22   [name], MD  metoprolol tartrate (LOPRESSOR) 25 MG tablet Take 1 tablet (25 mg total) by mouth 2 (two) times daily. 04/26/22   Nita Sells, MD  Multiple Vitamins-Minerals (PRESERVISION AREDS 2) CAPS Take 1 capsule by mouth in the morning and at bedtime.    [provider]  pantoprazole (PROTONIX) 40 MG tablet Take 1 tablet (40 mg total) by mouth 2 (two) times daily before a meal. 04/26/22 06/25/22  Nita Sells, MD  Propylene Glycol (SYSTANE BALANCE OP) Place 1 drop into both eyes 2 (two) times daily as needed (for dryness).    [provider]  rosuvastatin (CRESTOR) 10 MG tablet TAKE 1 TABLET BY MOUTH DAILY. Patient taking differently: Take 10 mg by mouth at bedtime. 12/27/21 12/27/22  Lorretta Harp, MD  tamsulosin (FLOMAX) 0.4 MG CAPS capsule Take 1 capsule (0.4 mg total) by mouth nightly at bedtime. 02/24/22     trimethoprim (TRIMPEX) 100 MG tablet Take 1/2 tablet (50 mg total) by mouth at bedtime once current antibiotic is  finished. 04/13/22       Physical Exam    Vital Signs:  MAYFORD ALBERG does not have vital signs available for review today.  Given telephonic nature of communication, physical exam is limited. AAOx3. NAD. Normal affect.  Speech and respirations are unlabored.  Accessory Clinical Findings    None  Assessment & Plan    1.  Preoperative Cardiovascular Risk Assessment: The patient is doing well from a cardiac perspective. Therefore, based on ACC/AHA guidelines, the patient would be at acceptable risk for the planned procedure without further cardiovascular testing. According to the Revised Cardiac Risk Index (RCRI), his Perioperative Risk of Major Cardiac Event is (%): 11 His Functional Capacity in METs is: 4.4 according to the Duke Activity Status Index (DASI).  The patient was advised that if he develops new symptoms prior to surgery to contact our office to arrange for a follow-up visit, and he verbalized understanding.  He is currently holding Eliquis until 05/02/22 secondary to recent GI bleed. He may hold an additional day and resume when deemed hemodynamically stable after the procedure.  A copy of this note will be routed to requesting surgeon.  Time:   Today, I have spent 10 minutes with the patient with telehealth technology discussing medical history, symptoms, and management plan.     Emmaline Life, NP-C  04/28/2022, 3:26 PM [address] Office [phone] Fax [phone]

## 2022-04-29 ENCOUNTER — Encounter (HOSPITAL_COMMUNITY)
Admission: RE | Admit: 2022-04-29 | Discharge: 2022-04-29 | Disposition: A | Discharge status: Home / Self Care | Payer: Medicare Other | Source: Ambulatory Visit | Attending: Urology | Admitting: Urology

## 2022-04-29 ENCOUNTER — Other Ambulatory Visit: Payer: Self-pay

## 2022-04-29 ENCOUNTER — Encounter (HOSPITAL_COMMUNITY): Payer: Self-pay

## 2022-04-29 DIAGNOSIS — I517 Cardiomegaly: Secondary | ICD-10-CM | POA: Insufficient documentation

## 2022-04-29 DIAGNOSIS — I509 Heart failure, unspecified: Secondary | ICD-10-CM | POA: Diagnosis not present

## 2022-04-29 DIAGNOSIS — Z7901 Long term (current) use of anticoagulants: Secondary | ICD-10-CM | POA: Insufficient documentation

## 2022-04-29 DIAGNOSIS — R1084 Generalized abdominal pain: Secondary | ICD-10-CM | POA: Diagnosis not present

## 2022-04-29 DIAGNOSIS — I480 Paroxysmal atrial fibrillation: Secondary | ICD-10-CM | POA: Diagnosis not present

## 2022-04-29 DIAGNOSIS — N200 Calculus of kidney: Secondary | ICD-10-CM | POA: Diagnosis not present

## 2022-04-29 DIAGNOSIS — I351 Nonrheumatic aortic (valve) insufficiency: Secondary | ICD-10-CM | POA: Insufficient documentation

## 2022-04-29 NOTE — Anesthesia Preprocedure Evaluation (Signed)
Anesthesia Evaluation  Patient identified by MRN, date of birth, ID band Patient awake    Reviewed: Allergy & Precautions, NPO status , Patient's Chart, lab work & pertinent test results  Airway Mallampati: II  TM Distance: >3 FB Neck ROM: Full    Dental  (+) Edentulous Upper, Upper Dentures, Dental Advisory Given   Pulmonary COPD   Pulmonary exam normal breath sounds clear to auscultation       Cardiovascular hypertension, + angina  + CAD and +CHF  Normal cardiovascular exam+ dysrhythmias Atrial Fibrillation + Valvular Problems/Murmurs AI, AS and MR  Rhythm:Regular Rate:Normal  TTE 09/30/2019: 1. Left ventricular ejection fraction, by estimation, is 40 to 45%. The  left ventricle has mildly decreased function. The left ventricle  demonstrates global hypokinesis. The left ventricular internal cavity size  was moderately dilated. There is mild  left ventricular hypertrophy of the basal-septal segment. Left ventricular  diastolic parameters are consistent with Grade I diastolic dysfunction  (impaired relaxation). Elevated left ventricular end-diastolic pressure.   2. Right ventricular systolic function is normal. The right ventricular  size is normal. There is normal pulmonary artery systolic pressure.   3. Left atrial size was severely dilated.   4. The mitral valve is degenerative. Mild to moderate mitral valve  regurgitation. No evidence of mitral stenosis.   5. The aortic valve is tricuspid. Aortic valve regurgitation is moderate.  Moderate aortic valve stenosis. Aortic regurgitation PHT measures 410  msec. Aortic valve area, by VTI measures 1.23 cm. Aortic valve mean  gradient measures 22.0 mmHg. Aortic  valve Vmax measures 3.03 m/s.   6. Aortic dilatation noted. There is mild dilatation of the aortic root  and moderate dilatation of the ascending aorta measuring 41 mm and 19m.   7. The inferior vena cava is normal in size  with greater than 50%  respiratory variability, suggesting right atrial pressure of 3 mmHg.   Comparison(s): 07/16/19 EF 35-40%. Mild AS 132mg mean PG, 2976m peak PG.  Compared to prior echo 3/21, LVF appears improved. AS appears moderate  with mean AVG 56m58mand AVA 1.23cm2 with peak AV velociy 66m/s12mortic  insusficiency is moderate and  eccentrically directed towards the anterior mitral valve leaflet.    Nuclear stress 03/30/2016: The left ventricular ejection fraction is mildly decreased (45-54%). Nuclear stress EF: 46%. There was no ST segment deviation noted during stress. This is a low risk study. Normal perfusion EF calculated at 46% but no discrete RWMA;s May be due to frequent ventricular ectopy    Neuro/Psych CVA  negative psych ROS   GI/Hepatic Neg liver ROS, hiatal hernia,,,  Endo/Other  negative endocrine ROS    Renal/GU Renal InsufficiencyRenal disease  negative genitourinary   Musculoskeletal  (+) Arthritis ,    Abdominal   Peds  Hematology negative hematology ROS (+)   Anesthesia Other Findings   Reproductive/Obstetrics                             Anesthesia Physical Anesthesia Plan  ASA: 3  Anesthesia Plan: General   Post-op Pain Management: Tylenol PO (pre-op)*   Induction: Intravenous  PONV Risk Score and Plan: 2 and Ondansetron and Dexamethasone  Airway Management Planned: LMA  Additional Equipment:   Intra-op Plan:   Post-operative Plan: Extubation in OR  Informed Consent: I have reviewed the patients History and Physical, chart, labs and discussed the procedure including the risks, benefits and alternatives for the proposed anesthesia  with the patient or authorized representative who has indicated his/her understanding and acceptance.     Dental advisory given  Plan Discussed with: CRNA  Anesthesia Plan Comments:         Anesthesia Quick Evaluation

## 2022-04-29 NOTE — Progress Notes (Signed)
Anesthesia Chart Review:  89 year old male with pertinent history of paroxysmal A-fib on anticoagulation, chronic combined heart failure, valvular disease (moderate AI, AS, and MR with moderate LV dysfunction - per Dr. Kennon Holter note 01/12/2022, patient was completely asymptomatic from his valvular issues and shared decision was made not to continue to follow with additional echocardiograms), mild carotid stenosis (bilateral 1 to 39% by duplex 02/2016), HTN, previous CVA with right MCA aneurysm, CKD 3, asbestos related COPD, GI bleed.    Recent admission 1/3 through 04/26/2018 for for A-fib RVR and acute GI bleed.  CTA showed active extravasation, IR performed mesenteric angiogram and embolization descending duodenum. Patient had further drop in hemoglobin and dark stool per rectum but repeat CTA performed 1/5 showed no active extravasation.  He received a total of 3 units PRBC. He was discharged on twice daily Protonix and advised to hold Eliquis until 1/15. Cardiac meds were also adjusted during admission he was ultimately placed on metoprolol 25 twice daily and Cardizem CD 180.  His amiodarone was discontinued due to narrow therapeutic index.  His heart rate was noted to be moderately well-controlled at discharge.  Post discharge patient was seen by cardiology APP Stephan Minister, NP via televisit on 04/28/2022 for preop evaluation.  Per note, " Preoperative Cardiovascular Risk Assessment: The patient is doing well from a cardiac perspective. Therefore, based on ACC/AHA guidelines, the patient would be at acceptable risk for the planned procedure without further cardiovascular testing. According to the Revised Cardiac Risk Index (RCRI), his Perioperative Risk of Major Cardiac Event is (%): 11 His Functional Capacity in METs is: 4.4 according to the Duke Activity Status Index (DASI). The patient was advised that if he develops new symptoms prior to surgery to contact our office to arrange for a follow-up visit,  and he verbalized understanding. He is currently holding Eliquis until 05/02/22 secondary to recent GI bleed. He may hold an additional day and resume when deemed hemodynamically stable after the procedure."  CBC and BMP from 04/26/2022 reviewed, mild anemia with hemoglobin 10.5, otherwise unremarkable.  TTE 09/30/2019: 1. Left ventricular ejection fraction, by estimation, is 40 to 45%. The  left ventricle has mildly decreased function. The left ventricle  demonstrates global hypokinesis. The left ventricular internal cavity size  was moderately dilated. There is mild  left ventricular hypertrophy of the basal-septal segment. Left ventricular  diastolic parameters are consistent with Grade I diastolic dysfunction  (impaired relaxation). Elevated left ventricular end-diastolic pressure.   2. Right ventricular systolic function is normal. The right ventricular  size is normal. There is normal pulmonary artery systolic pressure.   3. Left atrial size was severely dilated.   4. The mitral valve is degenerative. Mild to moderate mitral valve  regurgitation. No evidence of mitral stenosis.   5. The aortic valve is tricuspid. Aortic valve regurgitation is moderate.  Moderate aortic valve stenosis. Aortic regurgitation PHT measures 410  msec. Aortic valve area, by VTI measures 1.23 cm. Aortic valve mean  gradient measures 22.0 mmHg. Aortic  valve Vmax measures 3.03 m/s.   6. Aortic dilatation noted. There is mild dilatation of the aortic root  and moderate dilatation of the ascending aorta measuring 41 mm and 53m.   7. The inferior vena cava is normal in size with greater than 50%  respiratory variability, suggesting right atrial pressure of 3 mmHg.   Comparison(s): 07/16/19 EF 35-40%. Mild AS 179mg mean PG, 2921m peak PG.  Compared to prior echo 3/21, LVF appears improved. AS appears moderate  with mean AVG 54mHg and AVA 1.23cm2 with peak AV velociy 371m. Aortic  insusficiency is moderate and   eccentrically directed towards the anterior mitral valve leaflet.   Nuclear stress 03/30/2016: The left ventricular ejection fraction is mildly decreased (45-54%). Nuclear stress EF: 46%. There was no ST segment deviation noted during stress. This is a low risk study.   Normal perfusion EF calculated at 46% but no discrete RWMA;s May be due to frequent ventricular ectopy    Richard Reed, StyerCThe University Of [facility]/Anesthesiology Phone ([phone]/03/2023 10:38 AM

## 2022-04-29 NOTE — Progress Notes (Signed)
For Short Stay: [name] appointment date:  Bowel Prep reminder:   For Anesthesia: PCP - Jillyn Ledger: FNP Cardiologist - Dr. Carmin Richmond. Clearance: Christen Bame: NP-C: 04/28/22: EPIC  Chest x-ray -  EKG - 04/25/22 Stress Test -  ECHO - 09/30/19 Cardiac Cath - 09/2010 Pacemaker/ICD device last checked: Pacemaker orders received: Device Rep notified:  Spinal Cord Stimulator:  Sleep Study -  CPAP -   Fasting Blood Sugar -  Checks Blood Sugar _____ times a day Date and result of last Hgb A1c-  Last dose of GLP1 agonist-  GLP1 instructions:   Last dose of SGLT-2 inhibitors-  SGLT-2 instructions:   Blood Thinner Instructions: Eliquis has been on hold because of GI bleed,and will be resume after surgery. Aspirin Instructions: Last Dose:  Activity level: Can go up a flight of stairs and activities of daily living without stopping and without chest pain and/or shortness of breath   Able to exercise without chest pain and/or shortness of breath  Anesthesia review: Hx: CAD,CKD III,Afib,TIA;s. Pt. Is a little weak after hospitalization,but he lives at home with his wife and is very independent.  Patient denies shortness of breath, fever, cough and chest pain at PAT appointment   Patient verbalized understanding of instructions that were given to them at the PAT appointment. Patient was also instructed that they will need to review over the PAT instructions again at home before surgery.

## 2022-05-03 ENCOUNTER — Encounter (HOSPITAL_COMMUNITY): Payer: Self-pay | Admitting: Urology

## 2022-05-03 ENCOUNTER — Ambulatory Visit (HOSPITAL_BASED_OUTPATIENT_CLINIC_OR_DEPARTMENT_OTHER): Payer: Medicare Other | Admitting: Anesthesiology

## 2022-05-03 ENCOUNTER — Ambulatory Visit (HOSPITAL_COMMUNITY): Payer: Medicare Other | Admitting: Physician Assistant

## 2022-05-03 ENCOUNTER — Ambulatory Visit (HOSPITAL_COMMUNITY)
Admission: RE | Admit: 2022-05-03 | Discharge: 2022-05-03 | Disposition: A | Discharge status: Home / Self Care | Payer: Medicare Other | Source: Ambulatory Visit | Attending: Urology | Admitting: Urology

## 2022-05-03 ENCOUNTER — Ambulatory Visit (HOSPITAL_COMMUNITY): Payer: Medicare Other

## 2022-05-03 ENCOUNTER — Encounter (HOSPITAL_COMMUNITY)
Admission: RE | Disposition: A | Discharge status: Home / Self Care | Payer: Self-pay | Source: Ambulatory Visit | Attending: Urology

## 2022-05-03 DIAGNOSIS — I10 Essential (primary) hypertension: Secondary | ICD-10-CM

## 2022-05-03 DIAGNOSIS — I110 Hypertensive heart disease with heart failure: Secondary | ICD-10-CM | POA: Diagnosis not present

## 2022-05-03 DIAGNOSIS — I509 Heart failure, unspecified: Secondary | ICD-10-CM | POA: Diagnosis not present

## 2022-05-03 DIAGNOSIS — R3915 Urgency of urination: Secondary | ICD-10-CM | POA: Diagnosis not present

## 2022-05-03 DIAGNOSIS — I25119 Atherosclerotic heart disease of native coronary artery with unspecified angina pectoris: Secondary | ICD-10-CM

## 2022-05-03 DIAGNOSIS — R8271 Bacteriuria: Secondary | ICD-10-CM | POA: Insufficient documentation

## 2022-05-03 DIAGNOSIS — N200 Calculus of kidney: Secondary | ICD-10-CM

## 2022-05-03 DIAGNOSIS — R3911 Hesitancy of micturition: Secondary | ICD-10-CM | POA: Diagnosis not present

## 2022-05-03 DIAGNOSIS — J449 Chronic obstructive pulmonary disease, unspecified: Secondary | ICD-10-CM

## 2022-05-03 DIAGNOSIS — I4891 Unspecified atrial fibrillation: Secondary | ICD-10-CM | POA: Insufficient documentation

## 2022-05-03 DIAGNOSIS — N401 Enlarged prostate with lower urinary tract symptoms: Secondary | ICD-10-CM | POA: Diagnosis not present

## 2022-05-03 HISTORY — PX: CYSTOSCOPY/URETEROSCOPY/HOLMIUM LASER/STENT PLACEMENT: SHX6546

## 2022-05-03 LAB — CBC
HCT: 34.9 % — ABNORMAL LOW (ref 39.0–52.0)
Hemoglobin: 11.2 g/dL — ABNORMAL LOW (ref 13.0–17.0)
MCH: 31.4 pg (ref 26.0–34.0)
MCHC: 32.1 g/dL (ref 30.0–36.0)
MCV: 97.8 fL (ref 80.0–100.0)
Platelets: 176 10*3/uL (ref 150–400)
RBC: 3.57 MIL/uL — ABNORMAL LOW (ref 4.22–5.81)
RDW: 15.8 % — ABNORMAL HIGH (ref 11.5–15.5)
WBC: 3.8 10*3/uL — ABNORMAL LOW (ref 4.0–10.5)
nRBC: 0 % (ref 0.0–0.2)

## 2022-05-03 SURGERY — CYSTOSCOPY/URETEROSCOPY/HOLMIUM LASER/STENT PLACEMENT
Anesthesia: General | Site: Ureter | Laterality: Bilateral

## 2022-05-03 MED ORDER — PHENYLEPHRINE HCL (PRESSORS) 10 MG/ML IV SOLN
INTRAVENOUS | Status: AC
  Filled 2022-05-03: qty 1

## 2022-05-03 MED ORDER — LIDOCAINE HCL (PF) 2 % IJ SOLN
INTRAMUSCULAR | Status: AC
  Filled 2022-05-03: qty 5

## 2022-05-03 MED ORDER — IOHEXOL 300 MG/ML  SOLN
INTRAMUSCULAR | Status: DC | PRN
  Administered 2022-05-03: 19 mL

## 2022-05-03 MED ORDER — PROPOFOL 10 MG/ML IV BOLUS
INTRAVENOUS | Status: AC
  Filled 2022-05-03: qty 20

## 2022-05-03 MED ORDER — PHENYLEPHRINE HCL-NACL 20-0.9 MG/250ML-% IV SOLN
INTRAVENOUS | Status: DC | PRN
  Administered 2022-05-03: 40 ug/min via INTRAVENOUS

## 2022-05-03 MED ORDER — FENTANYL CITRATE (PF) 100 MCG/2ML IJ SOLN
INTRAMUSCULAR | Status: DC | PRN
  Administered 2022-05-03: 50 ug via INTRAVENOUS

## 2022-05-03 MED ORDER — ORAL CARE MOUTH RINSE: 15.0000 mL | Freq: Once | OROMUCOSAL | Status: AC

## 2022-05-03 MED ORDER — LACTATED RINGERS IV SOLN: INTRAVENOUS | Status: DC

## 2022-05-03 MED ORDER — CHLORHEXIDINE GLUCONATE 0.12 % MT SOLN
15.0000 mL | Freq: Once | OROMUCOSAL | Status: AC
  Administered 2022-05-03: 15 mL via OROMUCOSAL

## 2022-05-03 MED ORDER — FENTANYL CITRATE (PF) 100 MCG/2ML IJ SOLN
INTRAMUSCULAR | Status: AC
  Filled 2022-05-03: qty 2

## 2022-05-03 MED ORDER — ONDANSETRON HCL 4 MG/2ML IJ SOLN
INTRAMUSCULAR | Status: DC | PRN
  Administered 2022-05-03: 4 mg via INTRAVENOUS

## 2022-05-03 MED ORDER — SODIUM CHLORIDE 0.9 % IR SOLN
Status: DC | PRN
  Administered 2022-05-03: 3000 mL via INTRAVESICAL

## 2022-05-03 MED ORDER — FENTANYL CITRATE PF 50 MCG/ML IJ SOSY
25.0000 ug | PREFILLED_SYRINGE | INTRAMUSCULAR | Status: DC | PRN
  Administered 2022-05-03: 25 ug via INTRAVENOUS

## 2022-05-03 MED ORDER — FENTANYL CITRATE PF 50 MCG/ML IJ SOSY
PREFILLED_SYRINGE | INTRAMUSCULAR | Status: AC
  Filled 2022-05-03: qty 1

## 2022-05-03 MED ORDER — PHENYLEPHRINE 80 MCG/ML (10ML) SYRINGE FOR IV PUSH (FOR BLOOD PRESSURE SUPPORT)
PREFILLED_SYRINGE | INTRAVENOUS | Status: DC | PRN
  Administered 2022-05-03: 80 ug via INTRAVENOUS
  Administered 2022-05-03 (×4): 160 ug via INTRAVENOUS

## 2022-05-03 MED ORDER — 0.9 % SODIUM CHLORIDE (POUR BTL) OPTIME
TOPICAL | Status: DC | PRN
  Administered 2022-05-03: 1000 mL

## 2022-05-03 MED ORDER — LIDOCAINE 2% (20 MG/ML) 5 ML SYRINGE
INTRAMUSCULAR | Status: DC | PRN
  Administered 2022-05-03: 20 mg via INTRAVENOUS

## 2022-05-03 MED ORDER — PROPOFOL 10 MG/ML IV BOLUS
INTRAVENOUS | Status: DC | PRN
  Administered 2022-05-03: 70 mg via INTRAVENOUS

## 2022-05-03 MED ORDER — APIXABAN 2.5 MG PO TABS: 2.5000 mg | ORAL_TABLET | Freq: Two times a day (BID) | ORAL | 11 refills | Status: DC

## 2022-05-03 MED ORDER — DEXAMETHASONE SODIUM PHOSPHATE 10 MG/ML IJ SOLN
INTRAMUSCULAR | Status: DC | PRN
  Administered 2022-05-03: 10 mg via INTRAVENOUS

## 2022-05-03 MED ORDER — ACETAMINOPHEN 500 MG PO TABS
1000.0000 mg | ORAL_TABLET | Freq: Once | ORAL | Status: AC
  Administered 2022-05-03: 1000 mg via ORAL
  Filled 2022-05-03: qty 2

## 2022-05-03 MED ORDER — SODIUM CHLORIDE 0.9 % IV SOLN
2.0000 g | Freq: Once | INTRAVENOUS | Status: AC
  Administered 2022-05-03: 2 g via INTRAVENOUS
  Filled 2022-05-03: qty 12.5

## 2022-05-03 SURGICAL SUPPLY — 29 items
BAG DRN RND TRDRP ANRFLXCHMBR (UROLOGICAL SUPPLIES) ×1
BAG URINE DRAIN 2000ML AR STRL (UROLOGICAL SUPPLIES) IMPLANT
BAG URO CATCHER STRL LF (MISCELLANEOUS) ×1 IMPLANT
BASKET ZERO TIP NITINOL 2.4FR (BASKET) IMPLANT
BSKT STON RTRVL ZERO TP 2.4FR (BASKET)
CATH FOLEY 2WAY SLVR  5CC 16FR (CATHETERS) ×1
CATH FOLEY 2WAY SLVR 5CC 16FR (CATHETERS) IMPLANT
CATH URET 5FR 28IN CONE TIP (BALLOONS)
CATH URET 5FR 70CM CONE TIP (BALLOONS) IMPLANT
CATH URETL OPEN END 6FR 70 (CATHETERS) ×1 IMPLANT
CLOTH BEACON ORANGE TIMEOUT ST (SAFETY) ×1 IMPLANT
DRSG TEGADERM 2-3/8X2-3/4 SM (GAUZE/BANDAGES/DRESSINGS) IMPLANT
EXTRACTOR STONE 1.7FRX115CM (UROLOGICAL SUPPLIES) IMPLANT
GLOVE BIO SURGEON STRL SZ7.5 (GLOVE) ×1 IMPLANT
GOWN STRL REUS W/ TWL XL LVL3 (GOWN DISPOSABLE) ×1 IMPLANT
GOWN STRL REUS W/TWL XL LVL3 (GOWN DISPOSABLE) ×1
GUIDEWIRE STR DUAL SENSOR (WIRE) ×1 IMPLANT
GUIDEWIRE ZIPWRE .038 STRAIGHT (WIRE) IMPLANT
KIT TURNOVER KIT A (KITS) IMPLANT
LASER FIB FLEXIVA PULSE ID 365 (Laser) IMPLANT
MANIFOLD NEPTUNE II (INSTRUMENTS) ×1 IMPLANT
PACK CYSTO (CUSTOM PROCEDURE TRAY) ×1 IMPLANT
SHEATH NAVIGATOR HD 11/13X28 (SHEATH) IMPLANT
SHEATH NAVIGATOR HD 11/13X36 (SHEATH) IMPLANT
STENT URET 6FRX24 CONTOUR (STENTS) IMPLANT
TRACTIP FLEXIVA PULS ID 200XHI (Laser) IMPLANT
TRACTIP FLEXIVA PULSE ID 200 (Laser)
TUBING CONNECTING 10 (TUBING) ×1 IMPLANT
TUBING UROLOGY SET (TUBING) ×1 IMPLANT

## 2022-05-03 NOTE — Anesthesia Postprocedure Evaluation (Signed)
Anesthesia Post Note  Patient: SEELEY HISSONG  Procedure(s) Performed: CYSTOSCOPY BILATERAL URETEROSCOPY/HOLMIUM LASER/STENT PLACEMENT (Bilateral: Ureter)     Patient location during evaluation: PACU Anesthesia Type: General Level of consciousness: awake and alert Pain management: pain level controlled Vital Signs Assessment: post-procedure vital signs reviewed and stable Respiratory status: spontaneous breathing, nonlabored ventilation, respiratory function stable and patient connected to nasal cannula oxygen Cardiovascular status: blood pressure returned to baseline and stable Postop Assessment: no apparent nausea or vomiting Anesthetic complications: no  No notable events documented.  Last Vitals:  Vitals:   05/03/22 1545 05/03/22 1602  BP: 133/77 133/73  Pulse: 66 67  Resp: 13 18  Temp:    SpO2: 100% 99%    Last Pain:  Vitals:   05/03/22 1602  TempSrc:   PainSc: 4    Pain Goal:                   Jazmin Ley L Tayte Childers

## 2022-05-03 NOTE — Transfer of Care (Signed)
Immediate Anesthesia Transfer of Care Note  Patient: Richard Reed  Procedure(s) Performed: CYSTOSCOPY BILATERAL URETEROSCOPY/HOLMIUM LASER/STENT PLACEMENT (Bilateral: Ureter)  Patient Location: PACU  Anesthesia Type:General  Level of Consciousness: sedated, patient cooperative, and responds to stimulation  Airway & Oxygen Therapy: Patient Spontanous Breathing and Patient connected to face mask oxygen  Post-op Assessment: Report given to RN and Post -op Vital signs reviewed and stable  Post vital signs: Reviewed and stable  Last Vitals:  Vitals Value Taken Time  BP 130/84 05/03/22 1528  Temp    Pulse 69 05/03/22 1529  Resp 12 05/03/22 1529  SpO2 100 % 05/03/22 1529  Vitals shown include unvalidated device data.  Last Pain:  Vitals:   05/03/22 1041  TempSrc:   PainSc: 0-No pain         Complications: No notable events documented.

## 2022-05-03 NOTE — Interval H&P Note (Signed)
History and Physical Interval Note:  05/03/2022 12:35 PM  [name]  has presented today for surgery, with the diagnosis of BILATERAL RENAL STONES.  The various methods of treatment have been discussed with the patient and family. After consideration of risks, benefits and other options for treatment, the patient has consented to  Procedure(s) with comments: CYSTOSCOPY BILATERAL URETEROSCOPY/HOLMIUM LASER/STENT PLACEMENT (Bilateral) - 2 HRS FOR CASE as a surgical intervention.  The patient's history has been reviewed, patient examined, no change in status, stable for surgery. Admission notes reviewed. Saw Cards. Holding eliquis. Can restart p this procedure. Pt stable. No further bloody BM. No N/V. No cough or congestion. Voiding well and urine clear. On 1/2 TMP nightly.  I have reviewed the patient's chart and labs.  Questions were answered to the patient's satisfaction.  Discussed stent exchange and possible foley with pt and [name].    [name]

## 2022-05-03 NOTE — Anesthesia Procedure Notes (Signed)
Procedure Name: LMA Insertion Date/Time: 05/03/2022 2:03 PM  Performed by: Gean Maidens, CRNAPre-anesthesia Checklist: Patient identified, Emergency Drugs available, Suction available, Patient being monitored and Timeout performed Patient Re-evaluated:Patient Re-evaluated prior to induction Oxygen Delivery Method: Circle system utilized Preoxygenation: Pre-oxygenation with 100% oxygen Induction Type: IV induction Ventilation: Mask ventilation without difficulty LMA: LMA inserted LMA Size: 4.0 Number of attempts: 1 Placement Confirmation: positive ETCO2 and breath sounds checked- equal and bilateral Tube secured with: Tape Dental Injury: Teeth and Oropharynx as per pre-operative assessment

## 2022-05-03 NOTE — Op Note (Signed)
Preoperative diagnosis: Bilateral renal stones Postoperative diagnosis: Same  Procedure: Cystoscopy with bilateral ureteroscopy, laser lithotripsy, stone basket extraction, stent exchange, left retrograde pyelogram  Surgeon: Junious Silk  Anesthesia: General  Indication for procedure: Richard Reed is a 89 year old male who was seen in the office and was not feeling well.  There was concern about intermittent obstruction from bilateral renal pelvic stones.  His creatinine was elevated.  He underwent urgent bilateral stents.  He is brought today for definitive stone management.  Findings: Stone in right renal pelvis fragmented and removed in 3 pieces.  2 stones in left renal pelvis dusted.  Both proximal ureters had a slight tortuosity and fold going into the renal pelvis.  Left retrograde pyelogram-this outlined a single ureter single collecting system unit with some mild dilation but no significant filling defect.  Description of procedure: After consent was obtained patient brought to the operating room.  After adequate anesthesia was placed lithotomy position and prepped and draped in the usual sterile fashion.  Timeout was performed to confirm the patient and procedure.  Cystoscope was passed per urethra and the bladder irrigated several times.  Right ureteral stent grasped and removed through the urethral meatus and a sensor wire was advanced up into the collecting system.  The stent was removed.  I then used a medium access sheath to get 2 wires in place.  I went adjacent to the sensor wire leaving it as the safety.  Dual channel digital ureteroscope was advanced where the stone was located in the renal pelvis and it washed into a midpole calyx where a 200 m laser fiber was advanced and the stone broken into 3 pieces.  They were sequentially removed with the engage basket.  No other stones were noted in the right.  The access sheath was backed out on the cystoscope and the renal pelvis collecting system  and ureter inspected on the way out all noted to be free of injury or stone fragment.  The cystoscope was then repassed where the left ureteral stent was grasped and removed through the urethral meatus.  Another sensor wire was advanced up into the left side.  The access sheath again used to get 2 wires in place.  Access sheath then placed beside the safety wire.  I will add the access sheath went easily on both sides.  Dual channel digital was advanced up into the left collecting system where 2 stones were noted and they were pushed up into the upper calyx.  This kidney was rotated or stat a bit funny with the middle and lower pole calyces more anterior.  I shot a retrograde pyelogram which outlined normal collecting system and proximal ureter.  I wanted to make sure I was not missing a duplicated system or another channel.  Having outline the collecting system the 200 m laser fiber was then advanced where the stones in the upper pole calyx were dusted.  I passed the engage basket and pulled out a fragment which was about 2 mm in size.  No other significant fragments were noted.  Collecting system carefully inspected.  Access sheath was backed out on the ureteroscope and again the collecting system renal pelvis ureter inspected on the way out and again noted to be free of any significant stone fragment free of injury.  The wire was backloaded on the cystoscope and a 6 x 24 cm left ureteral stent was placed.  The wire was then backloaded again on the cystoscope and a 6 x 24 cm right  ureteral stent was placed.  A left strings on both stents.  A 16 French Foley was placed and the strings were taped to the Foley.  He was then awakened taken recovery room in stable condition.  Complications: None  Blood loss: Minimal  Specimens to office: Stone fragments  Drains: Bilateral 6 x 24 cm ureteral stent with strings taped to a 16 French Foley catheter.  Disposition: Patient stable to PACU

## 2022-05-04 ENCOUNTER — Encounter (HOSPITAL_COMMUNITY): Payer: Self-pay | Admitting: Urology

## 2022-05-04 DIAGNOSIS — R3914 Feeling of incomplete bladder emptying: Secondary | ICD-10-CM | POA: Diagnosis not present

## 2022-05-04 DIAGNOSIS — N401 Enlarged prostate with lower urinary tract symptoms: Secondary | ICD-10-CM | POA: Diagnosis not present

## 2022-05-04 DIAGNOSIS — N1330 Unspecified hydronephrosis: Secondary | ICD-10-CM | POA: Diagnosis not present

## 2022-05-05 ENCOUNTER — Telehealth: Payer: Self-pay | Admitting: Cardiovascular Disease

## 2022-05-05 ENCOUNTER — Other Ambulatory Visit: Payer: Self-pay | Admitting: Cardiovascular Disease

## 2022-05-05 ENCOUNTER — Other Ambulatory Visit: Payer: Self-pay

## 2022-05-05 ENCOUNTER — Other Ambulatory Visit (HOSPITAL_COMMUNITY): Payer: Self-pay

## 2022-05-05 DIAGNOSIS — J439 Emphysema, unspecified: Secondary | ICD-10-CM | POA: Diagnosis not present

## 2022-05-05 DIAGNOSIS — I480 Paroxysmal atrial fibrillation: Secondary | ICD-10-CM | POA: Diagnosis not present

## 2022-05-05 DIAGNOSIS — N1832 Chronic kidney disease, stage 3b: Secondary | ICD-10-CM | POA: Diagnosis not present

## 2022-05-05 DIAGNOSIS — N200 Calculus of kidney: Secondary | ICD-10-CM | POA: Diagnosis not present

## 2022-05-05 DIAGNOSIS — R338 Other retention of urine: Secondary | ICD-10-CM | POA: Diagnosis not present

## 2022-05-05 DIAGNOSIS — I5032 Chronic diastolic (congestive) heart failure: Secondary | ICD-10-CM | POA: Diagnosis not present

## 2022-05-05 DIAGNOSIS — D6869 Other thrombophilia: Secondary | ICD-10-CM | POA: Diagnosis not present

## 2022-05-05 MED ORDER — METOPROLOL TARTRATE 25 MG PO TABS
25.0000 mg | ORAL_TABLET | Freq: Two times a day (BID) | ORAL | 6 refills | Status: DC
  Filled 2022-05-05: qty 60, 30d supply, fill #0
  Filled 2022-05-26 (×2): qty 60, 30d supply, fill #1
  Filled 2022-07-01: qty 60, 30d supply, fill #2

## 2022-05-05 NOTE — Telephone Encounter (Signed)
*  STAT* If patient is at the pharmacy, call can be transferred to refill team.   1. Which medications need to be refilled? (please list name of each medication and dose if known) metoprolol tartrate (LOPRESSOR) 25 MG tablet   2. Which pharmacy/location (including street and city if local pharmacy) is medication to be sent to?   [facility]    3. Do they need a 30 day or 90 day supply? [place]

## 2022-05-10 ENCOUNTER — Other Ambulatory Visit (HOSPITAL_COMMUNITY): Payer: Self-pay

## 2022-05-10 MED ORDER — NITROFURANTOIN MACROCRYSTAL 100 MG PO CAPS
100.0000 mg | ORAL_CAPSULE | Freq: Two times a day (BID) | ORAL | 0 refills | Status: DC
  Filled 2022-05-10: qty 14, 7d supply, fill #0

## 2022-05-16 DIAGNOSIS — E785 Hyperlipidemia, unspecified: Secondary | ICD-10-CM | POA: Diagnosis not present

## 2022-05-16 DIAGNOSIS — I5032 Chronic diastolic (congestive) heart failure: Secondary | ICD-10-CM | POA: Diagnosis not present

## 2022-05-16 DIAGNOSIS — N1832 Chronic kidney disease, stage 3b: Secondary | ICD-10-CM | POA: Diagnosis not present

## 2022-05-16 DIAGNOSIS — I10 Essential (primary) hypertension: Secondary | ICD-10-CM | POA: Diagnosis not present

## 2022-05-26 ENCOUNTER — Other Ambulatory Visit (HOSPITAL_COMMUNITY): Payer: Self-pay

## 2022-06-01 DIAGNOSIS — N3941 Urge incontinence: Secondary | ICD-10-CM | POA: Diagnosis not present

## 2022-06-01 DIAGNOSIS — N183 Chronic kidney disease, stage 3 unspecified: Secondary | ICD-10-CM | POA: Diagnosis not present

## 2022-06-01 DIAGNOSIS — Z8546 Personal history of malignant neoplasm of prostate: Secondary | ICD-10-CM | POA: Diagnosis not present

## 2022-06-08 ENCOUNTER — Ambulatory Visit: Payer: Medicare Other | Attending: Cardiovascular Disease | Admitting: Cardiovascular Disease

## 2022-06-08 ENCOUNTER — Encounter: Payer: Self-pay | Admitting: Cardiovascular Disease

## 2022-06-08 VITALS — BP 116/62 | HR 62 | Ht 67.0 in | Wt 138.2 lb

## 2022-06-08 DIAGNOSIS — I2583 Coronary atherosclerosis due to lipid rich plaque: Secondary | ICD-10-CM

## 2022-06-08 DIAGNOSIS — I519 Heart disease, unspecified: Secondary | ICD-10-CM

## 2022-06-08 DIAGNOSIS — I351 Nonrheumatic aortic (valve) insufficiency: Secondary | ICD-10-CM

## 2022-06-08 DIAGNOSIS — I2510 Atherosclerotic heart disease of native coronary artery without angina pectoris: Secondary | ICD-10-CM

## 2022-06-08 DIAGNOSIS — E785 Hyperlipidemia, unspecified: Secondary | ICD-10-CM | POA: Diagnosis not present

## 2022-06-08 DIAGNOSIS — I10 Essential (primary) hypertension: Secondary | ICD-10-CM | POA: Diagnosis not present

## 2022-06-08 DIAGNOSIS — I480 Paroxysmal atrial fibrillation: Secondary | ICD-10-CM

## 2022-06-08 NOTE — Patient Instructions (Signed)
Medication Instructions:  Your physician recommends that you continue on your current medications as directed. Please refer to the Current Medication list given to you today.  *If you need a refill on your cardiac medications before your next appointment, please call your pharmacy*   Follow-Up: At [facility], you and your health needs are our priority.  As part of our continuing mission to provide you with exceptional heart care, we have created designated Provider Care Teams.  These Care Teams include your primary Cardiologist (physician) and Advanced Practice Providers (APPs -  Physician Assistants and Nurse Practitioners) who all work together to provide you with the care you need, when you need it.  We recommend signing up for the patient portal called "MyChart".  Sign up information is provided on this After Visit Summary.  MyChart is used to connect with patients for Virtual Visits (Telemedicine).  Patients are able to view lab/test results, encounter notes, upcoming appointments, etc.  Non-urgent messages can be sent to your provider as well.   To learn more about what you can do with MyChart, go to [email or web address].    Your next appointment:   6 month(s)  Provider:   Fabian Sharp, PA-C, Sande Rives, PA-C, Caron Presume, PA-C, Jory Sims, DNP, ANP, Almyra Deforest, PA-C, or Diona Browner, NP      Then, Quay Burow, MD will plan to see you again in 12 month(s).

## 2022-06-08 NOTE — Assessment & Plan Note (Signed)
History of PAF maintaining sinus rhythm on low-dose Eliquis.

## 2022-06-08 NOTE — Progress Notes (Signed)
06/08/2022 Richard Reed   May 03, 1925  [id number]  Primary Physician [name], FNP Primary Cardiologist: Lorretta Harp MD Garret Reddish, [place], [place]  HPI:  Richard Reed is a 89 y.o.   thin-appearing married Caucasian male, father of 26, grandfather to 11 grandchildren, who I last saw in the office 01/13/2019 Richard Reed. He is accompanied by his daughter Richard Reed, and wife Richard Reed  He was hospitalized late 2012 with E. coli bacteremia and septic shock. He did develop some arrhythmias with PAF during that hospitalization. His EF was 45% to 50% with moderate aortic insufficiency. His other problems include hypertension and hyperlipidemia. I catheterized him in June 2012, revealing normal coronary arteries, with 40% to 60% ostial right coronary artery stenosis without damping. His right heart catheterization was unremarkable. He has had an uncomplicated laparoscopic cholecystectomy performed by Dr. Chucky May, April 12, 2011.   His most recent lab work performed 04/25/16 revealed total cholesterol 99, LDL of 47 and HDL 39. He was admitted to the hospital in [place] in November of last year with congestive heart failure. He is begun on diuretics. He admits that this was probably related to dietary indiscretion. He did have an episode of syncope substantive thought to be related to dehydration. Workup included carotid Dopplers that were unremarkable. Cerebral angiography did reveal an MCA aneurysm as well as a stenosis at the vertebrobasilar junction demonstrated by angiography performed by Dr. Patrecia Pour. Medical therapy was recommended. A 2-D echo revealed low normal LV function with EF of 50%, mild AS and moderate AI with mild elevation of pulmonary artery pressures and Myoview stress test was low risk as well. Since adjusting his diet he has become for  the most part asymptomatic. He did see Ignacia Bayley NP in the office 07/20/16 which time he was cardiac stable what was complaining of some left hip pain  with ambulation. They did change the insoles and shoes which somewhat helped and try to statin holiday which did not impact his pain and therefore was placed back on statin therapy.   He  was hospitalized in [place] from 5/7 through the 13th of this month with pneumonia, A. fib and heart failure.  He was diuresed.  He is placed antibiotics.  2D echo revealed EF of 30% with severe MR. He does have aortic insufficiency and aortic stenosis as well.  He was decided not to pursue follow-up 2D echocardiograms.   Since I saw him in the office 6 months ago he was hospitalized from 04/20/2022 for 6 days because of GI bleed.  He had nephrolithiasis and had ureteral stents placed.  He ultimately had interventional radiology perform intervention on his stomach to stop his bleeding.  He was transfused 3 units of packed red blood cells and his last hemoglobin performed 05/03/2022 was 11.2, up from 9.9.  He was placed back on low-dose Eliquis 2.5 mg p.o. twice daily.  He did go into A-fib during the hospitalization but currently is in sinus rhythm.  His ureteral stents been removed.  He denies chest pain or shortness of breath.     Current Meds  Medication Sig   apixaban (ELIQUIS) 2.5 MG TABS tablet Take 1 tablet (2.5 mg total) by mouth 2 (two) times daily.   B Complex-C (SUPER B COMPLEX PO) Take 1 tablet by mouth daily.   diltiazem (CARDIZEM CD) 180 MG 24 hr capsule Take 1 capsule (180 mg total) by mouth daily.   feeding supplement, ENSURE ENLIVE, (ENSURE ENLIVE) LIQD  Take 237 mLs by mouth 2 (two) times daily between meals. (Patient taking differently: Take 237 mLs by mouth daily.)   finasteride (PROSCAR) 5 MG tablet Take 1 tablet (5 mg total) by mouth daily.   furosemide (LASIX) 20 MG tablet Take 20 mg by mouth daily.   metoprolol tartrate (LOPRESSOR) 25 MG tablet Take 1 tablet (25 mg total) by mouth 2 (two) times daily.   Multiple Vitamins-Minerals (PRESERVISION AREDS 2) CAPS Take 1 capsule by mouth in the morning  and at bedtime.   pantoprazole (PROTONIX) 40 MG tablet Take 1 tablet (40 mg total) by mouth 2 (two) times daily before a meal.   Propylene Glycol (SYSTANE BALANCE OP) Place 1 drop into both eyes 2 (two) times daily as needed (for dryness).   rosuvastatin (CRESTOR) 10 MG tablet TAKE 1 TABLET BY MOUTH DAILY. (Patient taking differently: Take 10 mg by mouth at bedtime.)   tamsulosin (FLOMAX) 0.4 MG CAPS capsule Take 1 capsule (0.4 mg total) by mouth nightly at bedtime.     No Known Allergies  Social History   Socioeconomic History   Marital status: Married    Spouse name: Not on file   Number of children: 2   Years of education: HS   Highest education level: Not on file  Occupational History   Occupation: Retired  Tobacco Use   Smoking status: Never   Smokeless tobacco: Never  Vaping Use   Vaping Use: Never used  Substance and Sexual Activity   Alcohol use: No   Drug use: No   Sexual activity: Yes  Other Topics Concern   Not on file  Social History Narrative   Medical Decision Maker:  Richard Reed (wife) Cell. [place]: [phone].   Daughter: Nigel Sloop: Cell [phone]   Full Code   Patient is Left handed.   Patient drinks 1 cup of caffeine daily.   Patient is left handed.   Social Determinants of Health   Financial Resource Strain: Not on file  Food Insecurity: No Food Insecurity (04/21/2022)   Hunger Vital Sign    Worried About Running Out of Food in the Last Year: Never true    Ran Out of Food in the Last Year: Never true  Transportation Needs: No Transportation Needs (04/21/2022)   PRAPARE - Hydrologist (Medical): No    Lack of Transportation (Non-Medical): No  Physical Activity: Not on file  Stress: Not on file  Social Connections: Not on file  Intimate Partner Violence: Not At Risk (04/21/2022)   Humiliation, Afraid, Rape, and Kick questionnaire    Fear of Current or Ex-Partner: No    Emotionally Abused: No     Physically Abused: No    Sexually Abused: No     Review of Systems: General: negative for chills, fever, night sweats or weight changes.  Cardiovascular: negative for chest pain, dyspnea on exertion, edema, orthopnea, palpitations, paroxysmal nocturnal dyspnea or shortness of breath Dermatological: negative for rash Respiratory: negative for cough or wheezing Urologic: negative for hematuria Abdominal: negative for nausea, vomiting, diarrhea, bright red blood per rectum, melena, or hematemesis Neurologic: negative for visual changes, syncope, or dizziness All other systems reviewed and are otherwise negative except as noted above.    Blood pressure 116/62, pulse 62, height 5' 7"$  (1.702 m), weight 138 lb 3.2 oz (62.7 kg), SpO2 99 %.  General appearance: alert and no distress Neck: no adenopathy, no carotid bruit, no JVD, supple, symmetrical, trachea midline, and thyroid  not enlarged, symmetric, no tenderness/mass/nodules Lungs: clear to auscultation bilaterally Heart: Soft apical systolic murmur consistent with mitral vegetation. Extremities: extremities normal, atraumatic, no cyanosis or edema Pulses: 2+ and symmetric Skin: Skin color, texture, turgor normal. No rashes or lesions Neurologic: Grossly normal  EKG sinus rhythm at 62 with LVH voltage and occasional PACs.  I personally reviewed this EKG.  ASSESSMENT AND PLAN:   PAF (paroxysmal atrial fibrillation) (HCC) History of PAF maintaining sinus rhythm on low-dose Eliquis.  Aortic valve insufficiency, moderate History of multiple valvular abnormalities including severe MR as well as AI and AAS with LV dysfunction.  Given his advanced age we decided not to pursue ongoing serial echoes or intervention.  He is otherwise asymptomatic.  Essential hypertension History of essential hypertension blood pressure measured today at 116/62.  He is on diltiazem and metoprolol.  Dyslipidemia History of dyslipidemia on statin therapy with  lipid profile performed 07/16/2021 revealing total cholesterol 116, LDL of 54 and HDL 47.  Coronary artery disease due to lipid rich plaque History of CAD by cardiac catheterization but I performed June 2012.  He had 40 to 60% ostial right coronary artery without dampening.  He denies chest pain.  Left ventricular dysfunction 2D echo performed 09/30/2019 revealed EF of 40 to 45%.     Lorretta Harp MD FACP,FACC,FAHA, [facility] 06/08/2022 11:35 AM

## 2022-06-08 NOTE — Assessment & Plan Note (Signed)
History of CAD by cardiac catheterization but I performed June 2012.  He had 40 to 60% ostial right coronary artery without dampening.  He denies chest pain.

## 2022-06-08 NOTE — Assessment & Plan Note (Signed)
History of dyslipidemia on statin therapy with lipid profile performed 07/16/2021 revealing total cholesterol 116, LDL of 54 and HDL 47.

## 2022-06-08 NOTE — Assessment & Plan Note (Signed)
History of multiple valvular abnormalities including severe MR as well as AI and AAS with LV dysfunction.  Given his advanced age we decided not to pursue ongoing serial echoes or intervention.  He is otherwise asymptomatic.

## 2022-06-08 NOTE — Assessment & Plan Note (Signed)
History of essential hypertension blood pressure measured today at 116/62.  He is on diltiazem and metoprolol.

## 2022-06-08 NOTE — Assessment & Plan Note (Signed)
2D echo performed 09/30/2019 revealed EF of 40 to 45%.

## 2022-06-24 ENCOUNTER — Other Ambulatory Visit (HOSPITAL_COMMUNITY): Payer: Self-pay

## 2022-06-24 ENCOUNTER — Other Ambulatory Visit: Payer: Self-pay | Admitting: Cardiovascular Disease

## 2022-06-24 MED FILL — Diltiazem HCl Coated Beads Cap ER 24HR 180 MG: ORAL | 30 days supply | Qty: 30 | Fill #0 | Status: CN

## 2022-06-25 ENCOUNTER — Other Ambulatory Visit (HOSPITAL_COMMUNITY): Payer: Self-pay

## 2022-06-27 ENCOUNTER — Other Ambulatory Visit (HOSPITAL_COMMUNITY): Payer: Self-pay

## 2022-06-27 ENCOUNTER — Other Ambulatory Visit: Payer: Self-pay

## 2022-06-28 ENCOUNTER — Other Ambulatory Visit (HOSPITAL_COMMUNITY): Payer: Self-pay

## 2022-06-28 ENCOUNTER — Other Ambulatory Visit: Payer: Self-pay

## 2022-06-28 MED ORDER — PANTOPRAZOLE SODIUM 40 MG PO TBEC
40.0000 mg | DELAYED_RELEASE_TABLET | Freq: Two times a day (BID) | ORAL | 0 refills | Status: DC
  Filled 2022-06-28: qty 60, 30d supply, fill #0
  Filled 2022-08-01: qty 60, 30d supply, fill #1
  Filled 2022-08-17: qty 60, 30d supply, fill #2

## 2022-06-28 MED FILL — Diltiazem HCl Coated Beads Cap ER 24HR 180 MG: ORAL | 30 days supply | Qty: 30 | Fill #0 | Status: AC

## 2022-07-01 ENCOUNTER — Other Ambulatory Visit: Payer: Self-pay | Admitting: Cardiovascular Disease

## 2022-07-01 ENCOUNTER — Other Ambulatory Visit (HOSPITAL_COMMUNITY): Payer: Self-pay

## 2022-07-01 ENCOUNTER — Other Ambulatory Visit: Payer: Self-pay

## 2022-07-03 ENCOUNTER — Emergency Department (HOSPITAL_COMMUNITY): Payer: Medicare Other

## 2022-07-03 ENCOUNTER — Emergency Department (HOSPITAL_COMMUNITY)
Admission: EM | Admit: 2022-07-03 | Discharge: 2022-07-03 | Disposition: A | Discharge status: Home / Self Care | Payer: Medicare Other | Attending: Emergency Medicine | Admitting: Emergency Medicine

## 2022-07-03 ENCOUNTER — Other Ambulatory Visit: Payer: Self-pay

## 2022-07-03 ENCOUNTER — Encounter (HOSPITAL_COMMUNITY): Payer: Self-pay

## 2022-07-03 DIAGNOSIS — M79675 Pain in left toe(s): Secondary | ICD-10-CM | POA: Insufficient documentation

## 2022-07-03 DIAGNOSIS — R600 Localized edema: Secondary | ICD-10-CM | POA: Insufficient documentation

## 2022-07-03 DIAGNOSIS — M792 Neuralgia and neuritis, unspecified: Secondary | ICD-10-CM | POA: Diagnosis not present

## 2022-07-03 DIAGNOSIS — M79674 Pain in right toe(s): Secondary | ICD-10-CM | POA: Diagnosis not present

## 2022-07-03 DIAGNOSIS — Z7901 Long term (current) use of anticoagulants: Secondary | ICD-10-CM | POA: Diagnosis not present

## 2022-07-03 DIAGNOSIS — Z8679 Personal history of other diseases of the circulatory system: Secondary | ICD-10-CM

## 2022-07-03 DIAGNOSIS — M7989 Other specified soft tissue disorders: Secondary | ICD-10-CM | POA: Diagnosis not present

## 2022-07-03 DIAGNOSIS — G5761 Lesion of plantar nerve, right lower limb: Secondary | ICD-10-CM | POA: Diagnosis not present

## 2022-07-03 LAB — BASIC METABOLIC PANEL
Anion gap: 7 (ref 5–15)
BUN: 30 mg/dL — ABNORMAL HIGH (ref 8–23)
CO2: 23 mmol/L (ref 22–32)
Calcium: 8.2 mg/dL — ABNORMAL LOW (ref 8.9–10.3)
Chloride: 107 mmol/L (ref 98–111)
Creatinine, Ser: 1.54 mg/dL — ABNORMAL HIGH (ref 0.61–1.24)
GFR, Estimated: 42 mL/min — ABNORMAL LOW (ref >60)
Glucose, Bld: 124 mg/dL — ABNORMAL HIGH (ref 70–99)
Potassium: 3.6 mmol/L (ref 3.5–5.1)
Sodium: 137 mmol/L (ref 135–145)

## 2022-07-03 LAB — CBC WITH DIFFERENTIAL/PLATELET
Abs Immature Granulocytes: 0.01 10*3/uL (ref 0.00–0.07)
Basophils Absolute: 0 10*3/uL (ref 0.0–0.1)
Basophils Relative: 0 %
Eosinophils Absolute: 0 10*3/uL (ref 0.0–0.5)
Eosinophils Relative: 1 %
HCT: 37.5 % — ABNORMAL LOW (ref 39.0–52.0)
Hemoglobin: 12.1 g/dL — ABNORMAL LOW (ref 13.0–17.0)
Immature Granulocytes: 0 %
Lymphocytes Relative: 36 %
Lymphs Abs: 1.1 10*3/uL (ref 0.7–4.0)
MCH: 31.8 pg (ref 26.0–34.0)
MCHC: 32.3 g/dL (ref 30.0–36.0)
MCV: 98.4 fL (ref 80.0–100.0)
Monocytes Absolute: 0.3 10*3/uL (ref 0.1–1.0)
Monocytes Relative: 8 %
Neutro Abs: 1.7 10*3/uL (ref 1.7–7.7)
Neutrophils Relative %: 55 %
Platelets: 119 10*3/uL — ABNORMAL LOW (ref 150–400)
RBC: 3.81 MIL/uL — ABNORMAL LOW (ref 4.22–5.81)
RDW: 15.2 % (ref 11.5–15.5)
WBC: 3.1 10*3/uL — ABNORMAL LOW (ref 4.0–10.5)
nRBC: 0 % (ref 0.0–0.2)

## 2022-07-03 LAB — BRAIN NATRIURETIC PEPTIDE: B Natriuretic Peptide: 265.6 pg/mL — ABNORMAL HIGH (ref 0.0–100.0)

## 2022-07-03 MED ORDER — ACETAMINOPHEN 500 MG PO TABS
1000.0000 mg | ORAL_TABLET | Freq: Once | ORAL | Status: AC
  Administered 2022-07-03: 1000 mg via ORAL
  Filled 2022-07-03: qty 2

## 2022-07-03 MED ORDER — TRAMADOL HCL 50 MG PO TABS
50.0000 mg | ORAL_TABLET | Freq: Three times a day (TID) | ORAL | 0 refills | Status: DC | PRN
  Filled 2022-07-03: qty 10, 4d supply, fill #0

## 2022-07-03 MED ORDER — CEPHALEXIN 500 MG PO CAPS
500.0000 mg | ORAL_CAPSULE | Freq: Once | ORAL | Status: AC
  Administered 2022-07-03: 500 mg via ORAL
  Filled 2022-07-03: qty 1

## 2022-07-03 MED ORDER — TRAMADOL HCL 50 MG PO TABS
50.0000 mg | ORAL_TABLET | Freq: Once | ORAL | Status: AC
  Administered 2022-07-03: 50 mg via ORAL
  Filled 2022-07-03: qty 1

## 2022-07-03 MED ORDER — CEPHALEXIN 500 MG PO CAPS
500.0000 mg | ORAL_CAPSULE | Freq: Three times a day (TID) | ORAL | 0 refills | Status: DC
  Filled 2022-07-03: qty 15, 5d supply, fill #0

## 2022-07-03 NOTE — Discharge Instructions (Addendum)
It was our pleasure to provide your ER care today - we hope that you feel better.  Drink adequate fluids/stay well hydrated. Take antibiotic (keflex) as prescribed. take acetaminophen as need. You may also take ultram as need for pain.  You may be having a nerve type pain, neuritis or neuroma - see attached info.  Follow up closely with your doctor or podiatrist this coming week.   For edema of feet, keep feet elevated as much as possible when not up and about. Consider compression stockings.  Limit salt intake - follow heart health meal plan.   Follow up with primary care doctor in the coming week.  Return to ER if worse, new symptoms, discoloration of toe, persistent/severe toe pain, spreading redness, fevers, or other concern.

## 2022-07-03 NOTE — ED Provider Triage Note (Signed)
Emergency Medicine Provider Triage Evaluation Note  Richard Reed , a 89 y.o. male  was evaluated in triage.  Pt complains of pain and swelling in the toes of the left foot.  Patient takes Lasix daily.  He has swelling in bilateral lower extremities.  He states that he woke up at 5 AM this morning with sharp pains in the fourth toe of the left foot which radiated to the second toe of the left foot.  He denies hitting his toe or injuring his foot.  He endorses taking his Lasix as prescribed.  He denies shortness of breath.  Review of Systems  Positive: As above Negative: As above  Physical Exam  There were no vitals taken for this visit. Gen:   Awake, no distress   Resp:  Normal effort  MSK:   Moves extremities without difficulty  Other:  2+ pitting edema to bilateral lower extremities  Medical Decision Making  Medically screening exam initiated at 3:56 PM.  Appropriate orders placed.  [name] was informed that the remainder of the evaluation will be completed by another provider, this initial triage assessment does not replace that evaluation, and the importance of remaining in the ED until their evaluation is complete.     Dorothyann Peng, PA-C 07/03/22 1557

## 2022-07-03 NOTE — ED Triage Notes (Signed)
Pt arrived via POV. C/o left foot pain in the toes that began at 0500. Some redness noted, as well as swelling (pt is on lasix).  AOx4

## 2022-07-03 NOTE — ED Provider Notes (Signed)
[place] EMERGENCY DEPARTMENT AT [facility] Provider Note   CSN: [id number] Arrival date & time: 07/03/22  1543     History  Chief Complaint  Patient presents with   Foot Pain    Richard Reed is a 89 y.o. male.  Patient with c/o intermittent sharp pain to toe of left 2nd toe. Symptoms caused him to wake up early this AM. Denies chronic similar pain. No injury recalled. No change in color of toe. No numbness. No leg pain or claudication. Chronic swelling of bilateral feet/ankles.   The history is provided by the patient, a relative and medical records.  Foot Pain Pertinent negatives include no chest pain and no shortness of breath.       Home Medications Prior to Admission medications   Medication Sig Start Date End Date Taking? Authorizing Provider  apixaban (ELIQUIS) 2.5 MG TABS tablet Take 1 tablet (2.5 mg total) by mouth 2 (two) times daily. 05/04/22   [name], MD  B Complex-C (SUPER B COMPLEX PO) Take 1 tablet by mouth daily.    [provider]  diltiazem (CARDIZEM CD) 180 MG 24 hr capsule Take 1 capsule (180 mg total) by mouth daily. 06/24/22   Lorretta Harp, MD  feeding supplement, ENSURE ENLIVE, (ENSURE ENLIVE) LIQD Take 237 mLs by mouth 2 (two) times daily between meals. Patient taking differently: Take 237 mLs by mouth daily. 03/02/16   Geradine Girt, DO  finasteride (PROSCAR) 5 MG tablet Take 1 tablet (5 mg total) by mouth daily. 02/24/22     furosemide (LASIX) 20 MG tablet Take 20 mg by mouth daily.    [provider]  HYDROcodone-acetaminophen (NORCO/VICODIN) 5-325 MG tablet Take 1 tablet by mouth every 4 (four) hours as needed for moderate pain. Patient not taking: Reported on 06/08/2022 04/07/22   [name], MD  metoprolol tartrate (LOPRESSOR) 25 MG tablet Take 1 tablet (25 mg total) by mouth 2 (two) times daily. 05/05/22   Lorretta Harp, MD  Multiple Vitamins-Minerals (PRESERVISION AREDS 2) CAPS Take 1  capsule by mouth in the morning and at bedtime.    [provider]  nitrofurantoin (MACRODANTIN) 100 MG capsule Take 1 capsule (100 mg total) by mouth 2 (two) times daily. Patient not taking: Reported on 06/08/2022 05/10/22     pantoprazole (PROTONIX) 40 MG tablet Take 1 tablet (40 mg total) by mouth 2 (two) times daily before a meal. 04/26/22 06/25/22  Nita Sells, MD  pantoprazole (PROTONIX) 40 MG tablet Take 1 tablet (40 mg total) by mouth 2 (two) times daily with a meal. 06/28/22     Propylene Glycol (SYSTANE BALANCE OP) Place 1 drop into both eyes 2 (two) times daily as needed (for dryness).    [provider]  rosuvastatin (CRESTOR) 10 MG tablet TAKE 1 TABLET BY MOUTH DAILY. Patient taking differently: Take 10 mg by mouth at bedtime. 12/27/21 12/27/22  Lorretta Harp, MD  tamsulosin (FLOMAX) 0.4 MG CAPS capsule Take 1 capsule (0.4 mg total) by mouth nightly at bedtime. 02/24/22     trimethoprim (TRIMPEX) 100 MG tablet Take 1/2 tablet (50 mg total) by mouth at bedtime once current antibiotic is finished. Patient not taking: Reported on 06/08/2022 04/13/22         Allergies    Patient has no known allergies.    Review of Systems   Review of Systems  Constitutional:  Negative for fever.  HENT:  Negative for sore throat.   Respiratory:  Negative for shortness of breath.   Cardiovascular:  Negative for chest pain.  Musculoskeletal:  Negative for back pain.  Skin:  Negative for rash.  Neurological:  Negative for weakness and numbness.    Physical Exam Updated Vital Signs BP 120/86   Pulse (!) 114   Temp 97.6 F (36.4 C) (Oral)   Resp 18   Ht 1.702 m (5\' 7" )   Wt 68 kg   SpO2 96%   BMI 23.49 kg/m  Physical Exam Vitals and nursing note reviewed.  Constitutional:      Appearance: Normal appearance. He is well-developed.  HENT:     Head: Atraumatic.     Nose: Nose normal.     Mouth/Throat:     Mouth: Mucous membranes are moist.  Eyes:     General: No  scleral icterus.    Conjunctiva/sclera: Conjunctivae normal.  Neck:     Trachea: No tracheal deviation.  Cardiovascular:     Rate and Rhythm: Normal rate.     Pulses: Normal pulses.     Heart sounds:     No gallop.  Pulmonary:     Effort: Pulmonary effort is normal. No accessory muscle usage or respiratory distress.  Musculoskeletal:        General: No swelling.     Cervical back: Neck supple.     Comments: Left foot and toes of normal color and warmth and overall appearance of skin. Intact cap refill in toes. No focal left 2nd toe swelling or tenderness. Distal pulses palp in foot. Moderate, symmetric edema of bilateral feet.   Skin:    General: Skin is warm and dry.     Findings: No rash.  Neurological:     Mental Status: He is alert.     Comments: Alert, speech clear. Left foot nvi.   Psychiatric:        Mood and Affect: Mood normal.     ED Results / Procedures / Treatments   Labs (all labs ordered are listed, but only abnormal results are displayed) Results for orders placed or performed during the hospital encounter of 0000000  Basic metabolic panel  Result Value Ref Range   Sodium 137 135 - 145 mmol/L   Potassium 3.6 3.5 - 5.1 mmol/L   Chloride 107 98 - 111 mmol/L   CO2 23 22 - 32 mmol/L   Glucose, Bld 124 (H) 70 - 99 mg/dL   BUN 30 (H) 8 - 23 mg/dL   Creatinine, Ser 1.54 (H) 0.61 - 1.24 mg/dL   Calcium 8.2 (L) 8.9 - 10.3 mg/dL   GFR, Estimated 42 (L) >60 mL/min   Anion gap 7 5 - 15  Brain natriuretic peptide  Result Value Ref Range   B Natriuretic Peptide 265.6 (H) 0.0 - 100.0 pg/mL  CBC with Differential  Result Value Ref Range   WBC 3.1 (L) 4.0 - 10.5 K/uL   RBC 3.81 (L) 4.22 - 5.81 MIL/uL   Hemoglobin 12.1 (L) 13.0 - 17.0 g/dL   HCT 37.5 (L) 39.0 - 52.0 %   MCV 98.4 80.0 - 100.0 fL   MCH 31.8 26.0 - 34.0 pg   MCHC 32.3 30.0 - 36.0 g/dL   RDW 15.2 11.5 - 15.5 %   Platelets 119 (L) 150 - 400 K/uL   nRBC 0.0 0.0 - 0.2 %   Neutrophils Relative % 55 %    Neutro Abs 1.7 1.7 - 7.7 K/uL   Lymphocytes Relative 36 %   Lymphs Abs 1.1 0.7 -  4.0 K/uL   Monocytes Relative 8 %   Monocytes Absolute 0.3 0.1 - 1.0 K/uL   Eosinophils Relative 1 %   Eosinophils Absolute 0.0 0.0 - 0.5 K/uL   Basophils Relative 0 %   Basophils Absolute 0.0 0.0 - 0.1 K/uL   Immature Granulocytes 0 %   Abs Immature Granulocytes 0.01 0.00 - 0.07 K/uL   DG Foot Complete Left  Result Date: 07/03/2022 CLINICAL DATA:  Intermittent pain in left toes with swelling. EXAM: LEFT FOOT - COMPLETE 3+ VIEW COMPARISON:  None Available. FINDINGS: There is no evidence of fracture or dislocation. There is no evidence of arthropathy or other focal bone abnormality. Soft tissues are unremarkable. IMPRESSION: Negative. Electronically Signed   By: Misty Stanley M.D.   On: 07/03/2022 16:45     EKG EKG Interpretation  Date/Time:  Sunday July 03 2022 19:06:40 EDT Ventricular Rate:  94 PR Interval:    QRS Duration: 97 QT Interval:  403 QTC Calculation: 451 R Axis:   -40 Text Interpretation: Atrial fibrillation Left ventricular hypertrophy Non-specific ST-t changes Confirmed by Lajean Saver [phone]) on 07/03/2022 7:13:54 PM  Radiology DG Foot Complete Left  Result Date: 07/03/2022 CLINICAL DATA:  Intermittent pain in left toes with swelling. EXAM: LEFT FOOT - COMPLETE 3+ VIEW COMPARISON:  None Available. FINDINGS: There is no evidence of fracture or dislocation. There is no evidence of arthropathy or other focal bone abnormality. Soft tissues are unremarkable. IMPRESSION: Negative. Electronically Signed   By: Misty Stanley M.D.   On: 07/03/2022 16:45    Procedures Procedures    Medications Ordered in ED Medications  traMADol (ULTRAM) tablet 50 mg (has no administration in time range)  cephALEXin (KEFLEX) capsule 500 mg (has no administration in time range)  acetaminophen (TYLENOL) tablet 1,000 mg (1,000 mg Oral Given 07/03/22 1742)    ED Course/ Medical Decision Making/ A&P                              Medical Decision Making Problems Addressed: Edema of both feet: chronic illness or injury with exacerbation, progression, or side effects of treatment that poses a threat to life or bodily functions History of atrial fibrillation: chronic illness or injury with exacerbation, progression, or side effects of treatment that poses a threat to life or bodily functions Neuritis: acute illness or injury Toe pain, left: acute illness or injury  Amount and/or Complexity of Data Reviewed Independent Historian:     Details: Family, hx External Data Reviewed: notes. Labs: ordered. Decision-making details documented in ED Course. Radiology: ordered and independent interpretation performed. Decision-making details documented in ED Course. ECG/medicine tests: ordered and independent interpretation performed. Decision-making details documented in ED Course.  Risk OTC drugs. Prescription drug management.      Reviewed nursing notes and prior charts for additional history. External reports reviewed. Additional history from: family.   Labs reviewed/interpreted by me - wbc not elevated, similar to prior. K normal.    Xrays reviewed/interpreted by me - no fx.   Pt/fam requests something for pain - ultram po.   Recheck, mild erythema to dorsum 2nd toe ?early cellulitis. No pain w passive rom. Keflex po.  Cause of sudden, sharp pain not entirely clear ?possible neuritis/neuroma/mortons neuroma.   Pt currently appears stable for d/c.   Rec close pcp f/u.  Return precautions provided.          Final Clinical Impression(s) / ED Diagnoses Final diagnoses:  Toe pain, left  Edema of both feet  Neuritis    Rx / DC Orders ED Discharge Orders     None         Lajean Saver, MD 07/03/22 1929

## 2022-07-04 ENCOUNTER — Telehealth: Payer: Self-pay | Admitting: Cardiovascular Disease

## 2022-07-04 ENCOUNTER — Other Ambulatory Visit: Payer: Self-pay

## 2022-07-04 ENCOUNTER — Other Ambulatory Visit (HOSPITAL_COMMUNITY): Payer: Self-pay

## 2022-07-04 MED ORDER — FUROSEMIDE 20 MG PO TABS
20.0000 mg | ORAL_TABLET | Freq: Every day | ORAL | 3 refills | Status: DC
  Filled 2022-07-04 – 2022-07-05 (×2): qty 90, 90d supply, fill #0
  Filled 2022-10-06: qty 90, 90d supply, fill #1
  Filled 2023-01-09: qty 90, 90d supply, fill #2
  Filled 2023-04-18: qty 90, 90d supply, fill #3

## 2022-07-04 NOTE — Telephone Encounter (Signed)
*  STAT* If patient is at the pharmacy, call can be transferred to refill team.   1. Which medications need to be refilled? (please list name of each medication and dose if known)   furosemide (LASIX) 20 MG tablet    2. Which pharmacy/location (including street and city if local pharmacy) is medication to be sent to?  [facility]    3. Do they need a 30 day or 90 day supply? [place]

## 2022-07-04 NOTE — Telephone Encounter (Signed)
Spoke with patients daughter and let her know that I was sending the refill to the desired pharmacy due to not seeing any notes that from our office that he should not be taking it. Patients daughter was very thankful.

## 2022-07-04 NOTE — Telephone Encounter (Signed)
Wife is calling back because she is act the pharmacy now. Please advise

## 2022-07-05 ENCOUNTER — Other Ambulatory Visit: Payer: Self-pay

## 2022-07-05 DIAGNOSIS — I700 Atherosclerosis of aorta: Secondary | ICD-10-CM | POA: Diagnosis not present

## 2022-07-05 DIAGNOSIS — I509 Heart failure, unspecified: Secondary | ICD-10-CM | POA: Diagnosis not present

## 2022-07-05 DIAGNOSIS — L03032 Cellulitis of left toe: Secondary | ICD-10-CM | POA: Diagnosis not present

## 2022-07-06 ENCOUNTER — Telehealth: Payer: Self-pay

## 2022-07-06 NOTE — Telephone Encounter (Signed)
     Patient  visit on 07/03/2022  at [facility] was for toe pain left.  Have you been able to follow up with your primary care physician? Patient saw PCP 3/19.  The patient was or was not able to obtain any needed medicine or equipment. Patient was able to obtain medication.  Are there diet recommendations that you are having difficulty following? No   Patient expresses understanding of discharge instructions and education provided has no other needs at this time. Yes   [place] Resource Care Guide   [email or web address]  ?? [email or web address]   Website: [email or web address]  [email or web address]

## 2022-07-14 ENCOUNTER — Other Ambulatory Visit (HOSPITAL_COMMUNITY): Payer: Self-pay

## 2022-07-19 ENCOUNTER — Other Ambulatory Visit (HOSPITAL_COMMUNITY): Payer: Self-pay

## 2022-07-19 ENCOUNTER — Other Ambulatory Visit: Payer: Self-pay | Admitting: Cardiovascular Disease

## 2022-07-19 ENCOUNTER — Other Ambulatory Visit: Payer: Self-pay | Admitting: Urology

## 2022-07-19 DIAGNOSIS — I480 Paroxysmal atrial fibrillation: Secondary | ICD-10-CM

## 2022-07-19 MED ORDER — APIXABAN 2.5 MG PO TABS
2.5000 mg | ORAL_TABLET | Freq: Two times a day (BID) | ORAL | 11 refills | Status: DC
  Filled 2022-07-19: qty 60, 30d supply, fill #0
  Filled 2022-08-17: qty 60, 30d supply, fill #1

## 2022-07-19 MED FILL — Diltiazem HCl Coated Beads Cap ER 24HR 180 MG: ORAL | 30 days supply | Qty: 30 | Fill #1 | Status: AC

## 2022-07-19 NOTE — Telephone Encounter (Signed)
Prescription refill request for Eliquis received. Indication:Afib  Last office visit: 06/08/22 [name]) Scr: 1.54 (04/26/22)  Age: 89 Weight: 68kg  Appropriate dose. Refill sent.

## 2022-07-20 ENCOUNTER — Other Ambulatory Visit (HOSPITAL_COMMUNITY): Payer: Self-pay

## 2022-07-20 ENCOUNTER — Other Ambulatory Visit: Payer: Self-pay | Admitting: Cardiovascular Disease

## 2022-07-21 ENCOUNTER — Other Ambulatory Visit: Payer: Self-pay | Admitting: Cardiovascular Disease

## 2022-07-21 ENCOUNTER — Other Ambulatory Visit (HOSPITAL_COMMUNITY): Payer: Self-pay

## 2022-07-21 MED ORDER — METOPROLOL TARTRATE 25 MG PO TABS
25.0000 mg | ORAL_TABLET | Freq: Two times a day (BID) | ORAL | 3 refills | Status: DC
  Filled 2022-07-21: qty 180, 90d supply, fill #0
  Filled 2022-11-02 (×2): qty 180, 90d supply, fill #1
  Filled 2023-02-11: qty 180, 90d supply, fill #2
  Filled 2023-04-26: qty 180, 90d supply, fill #3

## 2022-07-26 DIAGNOSIS — I130 Hypertensive heart and chronic kidney disease with heart failure and stage 1 through stage 4 chronic kidney disease, or unspecified chronic kidney disease: Secondary | ICD-10-CM | POA: Diagnosis not present

## 2022-07-26 DIAGNOSIS — D6869 Other thrombophilia: Secondary | ICD-10-CM | POA: Diagnosis not present

## 2022-07-26 DIAGNOSIS — J439 Emphysema, unspecified: Secondary | ICD-10-CM | POA: Diagnosis not present

## 2022-07-26 DIAGNOSIS — N1832 Chronic kidney disease, stage 3b: Secondary | ICD-10-CM | POA: Diagnosis not present

## 2022-07-26 DIAGNOSIS — D638 Anemia in other chronic diseases classified elsewhere: Secondary | ICD-10-CM | POA: Diagnosis not present

## 2022-07-26 DIAGNOSIS — E785 Hyperlipidemia, unspecified: Secondary | ICD-10-CM | POA: Diagnosis not present

## 2022-07-26 DIAGNOSIS — Z0000 Encounter for general adult medical examination without abnormal findings: Secondary | ICD-10-CM | POA: Diagnosis not present

## 2022-07-26 DIAGNOSIS — I5032 Chronic diastolic (congestive) heart failure: Secondary | ICD-10-CM | POA: Diagnosis not present

## 2022-07-26 DIAGNOSIS — K219 Gastro-esophageal reflux disease without esophagitis: Secondary | ICD-10-CM | POA: Diagnosis not present

## 2022-07-26 DIAGNOSIS — I10 Essential (primary) hypertension: Secondary | ICD-10-CM | POA: Diagnosis not present

## 2022-07-26 DIAGNOSIS — I480 Paroxysmal atrial fibrillation: Secondary | ICD-10-CM | POA: Diagnosis not present

## 2022-07-30 NOTE — Progress Notes (Unsigned)
Cardiology Clinic Note   Date: 08/01/2022 ID: Richard Reed, DOB December 27, 1925, MRN [id number]  Primary Cardiologist:  Nanetta Batty, MD  Patient Profile    Richard Reed is a 89 y.o. male who presents to the clinic today for follow-up.  Past medical history significant for: Nonobstructive CAD. LHC 10/08/2010: Ostial RCA 46%. PAF. Onset 2012 in the setting of hospitalization for E. coli bacteremia and septic shock. Aortic insufficiency/LV dysfunction. Echo 09/30/2019: EF 40 to 45%.  Global hypokinesis.  Grade I DD.  Severe LAE.  Mild to moderate MR.  Moderate AI, moderate AS, mean gradient 22 mmHg.  Mild dilatation of aortic root 41 mm and moderate dilatation of ascending aorta 46 mm.  LV function improved from previous echo March 2021. Hypertension. Hyperlipidemia. Lipid panel 07/26/2022: LDL 52, HDL 46, TG 66, total 112. TIA. CKD, stage    History of Present Illness    Richard Reed is a longtime patient of cardiology followed by Dr. Allyson Sabal for the above outlined history.  Patient was last seen in the office by Dr. Allyson Sabal on 06/08/2022 following a 6-day hospital admission for GI bleed.  During hospitalization he had nephrolithiasis with ureteral stents placed.  Interventional radiology performed an intervention on his stomach to stop bleeding.  He was transfused 3 units PRBC.  He was started back on low-dose Eliquis.  A-fib during hospitalization.  At the time of his office visit with Dr. Allyson Sabal his ureteral stents had been removed.  Patient was in sinus rhythm.  Most recently, he presented to the ED on 07/03/2022 with complaints of left foot pain and edema.  Pain mainly in the toes of his left foot and edema was bilateral.  Edema/mild erythema to dorsum of second toe thought to be possible cellulitis.  Patient was treated with p.o. Keflex.  Patient was found to be in A-fib during ER visit.  Today, patient companied by his daughter.  He is severely hard of hearing.  Daughter reports he and  his wife were recently moved to [facility] independent living. Patient denies shortness of breath or dyspnea on exertion. No chest pain, pressure, or tightness. No orthopnea or PND. No palpitations.  He continues to have some mild lower extremity edema that daughter reports is greatly improved.  He complains of unsteadiness with gait secondary to nystagmus.  He is otherwise doing well.   ROS: All other systems reviewed and are otherwise negative except as noted in History of Present Illness.  Studies Reviewed    ECG personally reviewed by me today: Sinus rhythm with PACs, LAFB, 87 bpm.  No significant changes from 06/08/2022.  Risk Assessment/Calculations     CHA2DS2-VASc Score = 6   This indicates a 9.7% annual risk of stroke. The patient's score is based upon: CHF History: 0 HTN History: 1 Diabetes History: 0 Stroke History: 2 Vascular Disease History: 1 Age Score: 2 Gender Score: 0             Physical Exam    VS:  BP 124/84   Pulse 87   Ht 5\' 7"  (1.702 m)   Wt 141 lb 6.4 oz (64.1 kg)   SpO2 98%   BMI 22.15 kg/m  , BMI Body mass index is 22.15 kg/m.  GEN: Well nourished, well developed, in no acute distress. Neck: No JVD or carotid bruits. Cardiac:  RRR.  Occasional extrasystole.  2 out of 6 systolic murmur.  No rubs or gallops.   Respiratory:  Respirations regular and unlabored.  Clear to auscultation without rales, wheezing or rhonchi. GI: Soft, nontender, nondistended. Extremities: Radials/DP/PT 2+ and equal bilaterally. No clubbing or cyanosis.  Mild edema bilateral ankles. Skin: Warm and dry, no rash. Neuro: Strength intact.  Assessment & Plan   Nonobstructive CAD.  LHC June 2012 showed ostial RCA is 46%.  Patient denies chest pain, tightness, pressure.  Continue Eliquis, metoprolol, rosuvastatin. PAF.  Onset 2012 in the setting of hospitalization for E. coli bacteremia and septic shock.  Most recent episode during hospitalization March 2024 for possible  cellulitis of his toe.  Patient does not have cardiac awareness of A-fib.  He is in normal sinus rhythm today with PACs.  Continue Eliquis, diltiazem, metoprolol. Appropriate Eliquis dose. Aortic insufficiency/LV dysfunction.  Echo June 2021 showed EF 40 to 45%, grade 1 DD, moderate AI/AS with mean gradient 22 mmHg.  Patient denies dizziness, lightheadedness, presyncope, syncope.  No DOE, orthopnea, PND.  Discussed goals of care today.  Daughter is in agreement to not repeat echo as it would likely not change course of patient's care.  Patient has mild edema of bilateral ankles that daughter reports is likely his baseline. Discussed weight parameters for taking an extra dose of Lasix.  Patient is otherwise euvolemic and well compensated on exam.  Continue Lasix. Hypertension.  BP today 24/84.  Patient denies dizziness or headaches.  Continue diltiazem and metoprolol. Hyperlipidemia. LDL April 2024 52, at goal. Continue Crestor.   Disposition: Return in 6 months as previously planned or sooner as needed.         Signed, Etta Grandchild. Lisett Dirusso, DNP, NP-C

## 2022-08-01 ENCOUNTER — Other Ambulatory Visit (HOSPITAL_COMMUNITY): Payer: Self-pay

## 2022-08-01 ENCOUNTER — Ambulatory Visit: Payer: Medicare Other | Attending: Student | Admitting: Student

## 2022-08-01 ENCOUNTER — Encounter: Payer: Self-pay | Admitting: Student

## 2022-08-01 VITALS — BP 124/84 | HR 87 | Ht 67.0 in | Wt 141.4 lb

## 2022-08-01 DIAGNOSIS — I519 Heart disease, unspecified: Secondary | ICD-10-CM

## 2022-08-01 DIAGNOSIS — I351 Nonrheumatic aortic (valve) insufficiency: Secondary | ICD-10-CM

## 2022-08-01 DIAGNOSIS — I480 Paroxysmal atrial fibrillation: Secondary | ICD-10-CM | POA: Diagnosis not present

## 2022-08-01 DIAGNOSIS — E785 Hyperlipidemia, unspecified: Secondary | ICD-10-CM

## 2022-08-01 DIAGNOSIS — I2510 Atherosclerotic heart disease of native coronary artery without angina pectoris: Secondary | ICD-10-CM

## 2022-08-01 DIAGNOSIS — I10 Essential (primary) hypertension: Secondary | ICD-10-CM

## 2022-08-01 NOTE — Patient Instructions (Addendum)
Medication Instructions:  Your physician recommends that you continue on your current medications as directed. Please refer to the Current Medication list given to you today.  *If you need a refill on your cardiac medications before your next appointment, please call your pharmacy*   Lab Work: NONE If you have labs (blood work) drawn today and your tests are completely normal, you will receive your results only by: MyChart Message (if you have MyChart) OR A paper copy in the mail If you have any lab test that is abnormal or we need to change your treatment, we will call you to review the results.   Testing/Procedures: NONE   Follow-Up: At [facility] you and your health needs are our priority.  As part of our continuing mission to provide you with exceptional heart care, we have created designated Provider Care Teams.  These Care Teams include your primary Cardiologist (physician) and Advanced Practice Providers (APPs -  Physician Assistants and Nurse Practitioners) who all work together to provide you with the care you need, when you need it.  We recommend signing up for the patient portal called "MyChart".  Sign up information is provided on this After Visit Summary.  MyChart is used to connect with patients for Virtual Visits (Telemedicine).  Patients are able to view lab/test results, encounter notes, upcoming appointments, etc.  Non-urgent messages can be sent to your provider as well.   To learn more about what you can do with MyChart, go to [email or web address].    Your next appointment:   On or around August 21st with any app.

## 2022-08-11 DIAGNOSIS — H903 Sensorineural hearing loss, bilateral: Secondary | ICD-10-CM | POA: Diagnosis not present

## 2022-08-17 ENCOUNTER — Other Ambulatory Visit (HOSPITAL_COMMUNITY): Payer: Self-pay

## 2022-08-17 ENCOUNTER — Telehealth: Payer: Self-pay | Admitting: Cardiovascular Disease

## 2022-08-17 MED ORDER — APIXABAN 2.5 MG PO TABS: 2.5000 mg | ORAL_TABLET | Freq: Two times a day (BID) | ORAL | 0 refills | Status: DC

## 2022-08-17 MED FILL — Diltiazem HCl Coated Beads Cap ER 24HR 180 MG: ORAL | 30 days supply | Qty: 30 | Fill #2 | Status: AC

## 2022-08-17 NOTE — Telephone Encounter (Signed)
2.5 mg Eliquis samples at front desk for patient.    89 M SCr 1.54 Wt 64.1 kg  Pt is applying for patient assistance thru [employer]

## 2022-08-17 NOTE — Telephone Encounter (Signed)
Spoke with Baxter Hire, [facility] about getting them samples. We can enroll the patient in assistance. Will need information from patient in order to complete the application.

## 2022-08-17 NOTE — Telephone Encounter (Signed)
Pt c/o medication issue:  1. Name of Medication: apixaban (ELIQUIS) 2.5 MG TABS tablet   2. How are you currently taking this medication (dosage and times per day)?   3. Are you having a reaction (difficulty breathing--STAT)?   4. What is your medication issue? Pts daughter, Clydie Braun, states that a discount was used last month on the medication. However, now the medication will be $600. Clydie Braun would like to know if they can get samples or get another discount.

## 2022-08-18 ENCOUNTER — Other Ambulatory Visit (HOSPITAL_COMMUNITY): Payer: Self-pay

## 2022-08-18 ENCOUNTER — Telehealth: Payer: Self-pay | Admitting: Cardiovascular Disease

## 2022-08-18 DIAGNOSIS — D225 Melanocytic nevi of trunk: Secondary | ICD-10-CM | POA: Diagnosis not present

## 2022-08-18 DIAGNOSIS — D485 Neoplasm of uncertain behavior of skin: Secondary | ICD-10-CM | POA: Diagnosis not present

## 2022-08-18 DIAGNOSIS — L821 Other seborrheic keratosis: Secondary | ICD-10-CM | POA: Diagnosis not present

## 2022-08-18 DIAGNOSIS — L298 Other pruritus: Secondary | ICD-10-CM | POA: Diagnosis not present

## 2022-08-18 DIAGNOSIS — C44622 Squamous cell carcinoma of skin of right upper limb, including shoulder: Secondary | ICD-10-CM | POA: Diagnosis not present

## 2022-08-18 DIAGNOSIS — L814 Other melanin hyperpigmentation: Secondary | ICD-10-CM | POA: Diagnosis not present

## 2022-08-18 DIAGNOSIS — I480 Paroxysmal atrial fibrillation: Secondary | ICD-10-CM

## 2022-08-18 DIAGNOSIS — L728 Other follicular cysts of the skin and subcutaneous tissue: Secondary | ICD-10-CM | POA: Diagnosis not present

## 2022-08-18 MED ORDER — MUPIROCIN 2 % EX OINT
TOPICAL_OINTMENT | CUTANEOUS | 1 refills | Status: DC
  Filled 2022-08-18: qty 22, 30d supply, fill #0

## 2022-08-18 NOTE — Telephone Encounter (Signed)
Spoke with daughter and she will go the [employer] to print out application for patient assistance.

## 2022-08-18 NOTE — Telephone Encounter (Signed)
Wife calling to see how she can get the application forms. Please advise

## 2022-08-22 ENCOUNTER — Other Ambulatory Visit (HOSPITAL_COMMUNITY): Payer: Self-pay

## 2022-08-31 MED ORDER — APIXABAN 2.5 MG PO TABS: 2.5000 mg | ORAL_TABLET | Freq: Two times a day (BID) | ORAL | 3 refills | Status: DC

## 2022-08-31 NOTE — Addendum Note (Signed)
Addended by: Bernita Buffy on: 08/31/2022 05:10 PM   Modules accepted: Orders

## 2022-09-13 ENCOUNTER — Telehealth: Payer: Self-pay | Admitting: Cardiovascular Disease

## 2022-09-13 ENCOUNTER — Other Ambulatory Visit: Payer: Self-pay

## 2022-09-13 DIAGNOSIS — H57813 Brow ptosis, bilateral: Secondary | ICD-10-CM | POA: Diagnosis not present

## 2022-09-13 DIAGNOSIS — H0012 Chalazion right lower eyelid: Secondary | ICD-10-CM | POA: Diagnosis not present

## 2022-09-13 DIAGNOSIS — H0231 Blepharochalasis right upper eyelid: Secondary | ICD-10-CM | POA: Diagnosis not present

## 2022-09-13 DIAGNOSIS — H0234 Blepharochalasis left upper eyelid: Secondary | ICD-10-CM | POA: Diagnosis not present

## 2022-09-13 DIAGNOSIS — H0279 Other degenerative disorders of eyelid and periocular area: Secondary | ICD-10-CM | POA: Diagnosis not present

## 2022-09-13 MED ORDER — APIXABAN 2.5 MG PO TABS: 2.5000 mg | ORAL_TABLET | Freq: Two times a day (BID) | ORAL | 0 refills | Status: DC

## 2022-09-13 NOTE — Telephone Encounter (Signed)
Called patient, advised samples were available for pick up- they will come today

## 2022-09-13 NOTE — Telephone Encounter (Signed)
Yes, patient can have samples. Dose is 2.5mg  BID

## 2022-09-13 NOTE — Telephone Encounter (Signed)
Patient calling the office for samples of medication:   1.  What medication and dosage are you requesting samples for? Eliquis  2.  Are you currently out of this medication?  Yes- waitibg to hear from the patient assistance program

## 2022-09-14 ENCOUNTER — Other Ambulatory Visit (HOSPITAL_COMMUNITY): Payer: Self-pay

## 2022-09-14 MED FILL — Diltiazem HCl Coated Beads Cap ER 24HR 180 MG: ORAL | 30 days supply | Qty: 30 | Fill #3 | Status: AC

## 2022-10-04 ENCOUNTER — Other Ambulatory Visit (HOSPITAL_COMMUNITY): Payer: Self-pay

## 2022-10-04 ENCOUNTER — Telehealth: Payer: Self-pay | Admitting: Cardiovascular Disease

## 2022-10-04 DIAGNOSIS — I480 Paroxysmal atrial fibrillation: Secondary | ICD-10-CM

## 2022-10-04 MED ORDER — APIXABAN 2.5 MG PO TABS
2.5000 mg | ORAL_TABLET | Freq: Two times a day (BID) | ORAL | 11 refills | Status: DC
  Filled 2022-10-04 – 2022-10-06 (×2): qty 60, 30d supply, fill #0
  Filled 2023-01-09: qty 60, 30d supply, fill #1

## 2022-10-04 NOTE — Telephone Encounter (Signed)
I cannot tell who submitted pt's Eliquis assistance application (I don't see any [employer] notes that we submitted it). He should call in to check on the status at #[phone]. His dose is correct if he needs another week of samples while application is pending.

## 2022-10-04 NOTE — Telephone Encounter (Signed)
Spoke with pt daughter, aware the patient will have to spend $909.19 out-of-pocket to qualify for the program. Aware we do not have any 2.5 mg samples. I offered 5 mg samples and she declined so as not to confuse the patient. Refill sent to the pharmacy electronically and they will check on the cost.

## 2022-10-04 NOTE — Telephone Encounter (Signed)
Patient calling the office for samples of medication:   1.  What medication and dosage are you requesting samples for? apixaban (ELIQUIS) 2.5 MG TABS tablet   2.  Are you currently out of this medication? No, just enough for this week   Patient's daughter states they have not yet heard back on patient assistance and the patient needs samples again.

## 2022-10-06 ENCOUNTER — Other Ambulatory Visit (HOSPITAL_COMMUNITY): Payer: Self-pay

## 2022-10-10 ENCOUNTER — Other Ambulatory Visit (HOSPITAL_COMMUNITY): Payer: Self-pay

## 2022-10-10 ENCOUNTER — Telehealth: Payer: Self-pay | Admitting: Cardiovascular Disease

## 2022-10-10 NOTE — Telephone Encounter (Signed)
Paper Work Dropped Off: Prescription cost paperwork.  Date: 06.24.24 @ 4:45pm  Location of paper:  Dr Hazle Coca mailbox

## 2022-10-15 MED FILL — Diltiazem HCl Coated Beads Cap ER 24HR 180 MG: ORAL | 30 days supply | Qty: 30 | Fill #4 | Status: AC

## 2022-10-17 ENCOUNTER — Other Ambulatory Visit (HOSPITAL_COMMUNITY): Payer: Self-pay

## 2022-10-18 ENCOUNTER — Other Ambulatory Visit (HOSPITAL_COMMUNITY): Payer: Self-pay

## 2022-10-18 NOTE — Telephone Encounter (Signed)
Richard Reed pt assistance form and updated out of pocket expense report included. Confirmation received of successful fax.

## 2022-10-21 NOTE — Telephone Encounter (Signed)
Confirmation received that pt was approved for pt assistance, good through 04/18/23.

## 2022-10-27 ENCOUNTER — Other Ambulatory Visit (HOSPITAL_COMMUNITY): Payer: Self-pay

## 2022-10-27 DIAGNOSIS — H57813 Brow ptosis, bilateral: Secondary | ICD-10-CM | POA: Diagnosis not present

## 2022-10-27 DIAGNOSIS — H0231 Blepharochalasis right upper eyelid: Secondary | ICD-10-CM | POA: Diagnosis not present

## 2022-10-27 DIAGNOSIS — H0279 Other degenerative disorders of eyelid and periocular area: Secondary | ICD-10-CM | POA: Diagnosis not present

## 2022-10-27 DIAGNOSIS — H0234 Blepharochalasis left upper eyelid: Secondary | ICD-10-CM | POA: Diagnosis not present

## 2022-10-27 DIAGNOSIS — H0012 Chalazion right lower eyelid: Secondary | ICD-10-CM | POA: Diagnosis not present

## 2022-10-27 MED ORDER — ERYTHROMYCIN 5 MG/GM OP OINT
TOPICAL_OINTMENT | OPHTHALMIC | 0 refills | Status: DC
  Filled 2022-10-27: qty 3.5, 10d supply, fill #0

## 2022-10-31 ENCOUNTER — Other Ambulatory Visit (HOSPITAL_COMMUNITY): Payer: Self-pay

## 2022-11-02 ENCOUNTER — Other Ambulatory Visit: Payer: Self-pay

## 2022-11-02 ENCOUNTER — Other Ambulatory Visit (HOSPITAL_COMMUNITY): Payer: Self-pay

## 2022-11-16 ENCOUNTER — Other Ambulatory Visit: Payer: Self-pay

## 2022-11-16 MED FILL — Diltiazem HCl Coated Beads Cap ER 24HR 180 MG: ORAL | 30 days supply | Qty: 30 | Fill #5 | Status: AC

## 2022-11-19 ENCOUNTER — Other Ambulatory Visit (HOSPITAL_COMMUNITY): Payer: Self-pay

## 2022-11-30 ENCOUNTER — Other Ambulatory Visit (HOSPITAL_COMMUNITY): Payer: Self-pay

## 2022-11-30 DIAGNOSIS — H57813 Brow ptosis, bilateral: Secondary | ICD-10-CM | POA: Diagnosis not present

## 2022-11-30 DIAGNOSIS — H0279 Other degenerative disorders of eyelid and periocular area: Secondary | ICD-10-CM | POA: Diagnosis not present

## 2022-11-30 DIAGNOSIS — H0012 Chalazion right lower eyelid: Secondary | ICD-10-CM | POA: Diagnosis not present

## 2022-12-01 ENCOUNTER — Other Ambulatory Visit (HOSPITAL_COMMUNITY): Payer: Self-pay

## 2022-12-01 MED ORDER — PANTOPRAZOLE SODIUM 40 MG PO TBEC
40.0000 mg | DELAYED_RELEASE_TABLET | Freq: Two times a day (BID) | ORAL | 0 refills | Status: DC
  Filled 2022-12-01: qty 180, 90d supply, fill #0

## 2022-12-03 NOTE — Progress Notes (Unsigned)
Cardiology Clinic Note   Date: 12/07/2022 ID: Grayson, Suhre 12/19/1925, MRN [id number]  Primary Cardiologist:  Nanetta Batty, MD  Patient Profile    Richard Reed is a 89 y.o. male who presents to the clinic today for routine follow up.     Past medical history significant for: Nonobstructive CAD. LHC 10/08/2010: Ostial RCA 46%. PAF. Onset 2012 in the setting of hospitalization for E. coli bacteremia and septic shock. Aortic insufficiency/LV dysfunction. Echo 09/30/2019: EF 40 to 45%.  Global hypokinesis.  Grade I DD.  Severe LAE.  Mild to moderate MR.  Moderate AI, moderate AS, mean gradient 22 mmHg.  Mild dilatation of aortic root 41 mm and moderate dilatation of ascending aorta 46 mm.  LV function improved from previous echo March 2021. Hypertension. Hyperlipidemia. Lipid panel 07/26/2022: LDL 52, HDL 46, TG 66, total 112. TIA. CKD.     History of Present Illness    Richard Reed is a longtime patient of cardiology followed by Dr. Allyson Sabal for the above outlined history. Patient was last seen in the office by Dr. Allyson Sabal on 06/08/2022 following a 6-day hospital admission for GI bleed. During hospitalization he had nephrolithiasis with ureteral stents placed. Interventional radiology performed an intervention on his stomach to stop bleeding. He was transfused 3 units PRBC. He was started back on low-dose Eliquis. A-fib during hospitalization. At the time of his office visit with Dr. Allyson Sabal his ureteral stents had been removed. Patient was in sinus rhythm.   Patient was last seen in the office by me on 08/01/2022 for routine follow-up.  He and his wife recently moved into [facility] independent living.  Doing well at that time and no changes were made.  Today, patient is accompanied by his daughter. He is doing well. Patient denies shortness of breath or dyspnea on exertion. Every once in a while he feels like he has to take a deep breath. This typically occurs when he is at rest  and is not really bothersome to him.  No chest pain, pressure, or tightness. Denies lower extremity edema, orthopnea, or PND. No palpitations. He walks a little for exercise at his living facility. He has extraction of 2 teeth coming up. His daughter wanted to confirm it was okay to stay on Eliquis. Explained for simple extractions of 1-2 teeth we don't typically recommend holding. He denies blood in stool or urine or other bleeding concerns.         ROS: All other systems reviewed and are otherwise negative except as noted in History of Present Illness.  Studies Reviewed     EKG is not ordered today.   Risk Assessment/Calculations     CHA2DS2-VASc Score = 6   This indicates a 9.7% annual risk of stroke. The patient's score is based upon: CHF History: 0 HTN History: 1 Diabetes History: 0 Stroke History: 2 Vascular Disease History: 1 Age Score: 2 Gender Score: 0             Physical Exam    VS:  BP 110/76 (BP Location: Right Arm, Patient Position: Sitting, Cuff Size: Small)   Pulse 60   Ht 5\' 5"  (1.651 m)   Wt 142 lb 9.6 oz (64.7 kg)   SpO2 99%   BMI 23.73 kg/m  , BMI Body mass index is 23.73 kg/m.  GEN: Well nourished, well developed, in no acute distress. Neck: No JVD or carotid bruits. Cardiac:  RRR. 2/6 systolic murmur. No rubs or gallops.  Respiratory:  Respirations regular and unlabored. Clear to auscultation without rales, wheezing or rhonchi. GI: Soft, nontender, nondistended. Extremities: Radials/DP/PT 2+ and equal bilaterally. No clubbing or cyanosis. No edema.  Skin: Warm and dry, no rash. Neuro: Strength intact.  Assessment & Plan    Nonobstructive CAD.  LHC June 2012 showed ostial RCA is 46%.  Patient denies chest pain, tightness, pressure.  Continue Eliquis, metoprolol, rosuvastatin. PAF.  Onset 2012 in the setting of hospitalization for E. coli bacteremia and septic shock.  Most recent episode during hospitalization March 2024 for possible  cellulitis of his toe.  Patient does not have cardiac awareness of A-fib.  He is in normal sinus rhythm today with PACs.Denies spontaneous bleeding concerns.  Continue Eliquis, diltiazem, metoprolol. Appropriate Eliquis dose. Aortic insufficiency/LV dysfunction.  Echo June 2021 showed EF 40 to 45%, grade 1 DD, moderate AI/AS with mean gradient 22 mmHg.  Patient denies dizziness, lightheadedness, presyncope, syncope.  No DOE, orthopnea, PND.  Discussed goals of care today.  Daughter is in agreement to not repeat echo as it would likely not change course of patient's care.  Patient has mild edema of bilateral ankles that daughter reports is likely his baseline. Discussed weight parameters for taking an extra dose of Lasix.  Patient is otherwise euvolemic and well compensated on exam.  Continue Lasix. Hypertension.  BP today 110/76.  Patient denies dizziness or headaches.  Continue diltiazem and metoprolol. Hyperlipidemia. LDL April 2024 52, at goal. Continue Crestor.   Disposition: Return in 6 months or sooner as needed.          Signed, Etta Grandchild. Cortney Mckinney, DNP, NP-C

## 2022-12-05 ENCOUNTER — Other Ambulatory Visit (HOSPITAL_COMMUNITY): Payer: Self-pay

## 2022-12-07 ENCOUNTER — Ambulatory Visit: Payer: Medicare Other | Admitting: Student

## 2022-12-07 ENCOUNTER — Encounter: Payer: Self-pay | Admitting: Student

## 2022-12-07 VITALS — BP 110/76 | HR 60 | Ht 65.0 in | Wt 142.6 lb

## 2022-12-07 DIAGNOSIS — I480 Paroxysmal atrial fibrillation: Secondary | ICD-10-CM | POA: Diagnosis not present

## 2022-12-07 DIAGNOSIS — I351 Nonrheumatic aortic (valve) insufficiency: Secondary | ICD-10-CM | POA: Diagnosis not present

## 2022-12-07 DIAGNOSIS — E785 Hyperlipidemia, unspecified: Secondary | ICD-10-CM

## 2022-12-07 DIAGNOSIS — I2510 Atherosclerotic heart disease of native coronary artery without angina pectoris: Secondary | ICD-10-CM | POA: Diagnosis not present

## 2022-12-07 DIAGNOSIS — I10 Essential (primary) hypertension: Secondary | ICD-10-CM

## 2022-12-07 DIAGNOSIS — I519 Heart disease, unspecified: Secondary | ICD-10-CM

## 2022-12-07 NOTE — Patient Instructions (Addendum)
Medication Instructions:  *If you need a refill on your cardiac medications before your next appointment, please call your pharmacy*   Lab Work: If you have labs (blood work) drawn today and your tests are completely normal, you will receive your results only by: MyChart Message (if you have MyChart) OR A paper copy in the mail If you have any lab test that is abnormal or we need to change your treatment, we will call you to review the results.  Follow-Up: At [facility], you and your health needs are our priority.  As part of our continuing mission to provide you with exceptional heart care, we have created designated Provider Care Teams.  These Care Teams include your primary Cardiologist (physician) and Advanced Practice Providers (APPs -  Physician Assistants and Nurse Practitioners) who all work together to provide you with the care you need, when you need it.  We recommend signing up for the patient portal called "MyChart".  Sign up information is provided on this After Visit Summary.  MyChart is used to connect with patients for Virtual Visits (Telemedicine).  Patients are able to view lab/test results, encounter notes, upcoming appointments, etc.  Non-urgent messages can be sent to your provider as well.   To learn more about what you can do with MyChart, go to [email or web address].    Your next appointment:   6 month(s)  Provider:   Nanetta Batty, MD A letter will be mailed to you as a reminder to call the office for your follow up appointment.

## 2022-12-15 ENCOUNTER — Other Ambulatory Visit (HOSPITAL_COMMUNITY): Payer: Self-pay

## 2022-12-15 MED ORDER — CHLORHEXIDINE GLUCONATE 0.12 % MT SOLN
OROMUCOSAL | 0 refills | Status: DC
  Filled 2022-12-15: qty 473, 16d supply, fill #0

## 2022-12-15 MED ORDER — TRAMADOL HCL 50 MG PO TABS
50.0000 mg | ORAL_TABLET | ORAL | 0 refills | Status: DC | PRN
  Filled 2022-12-15: qty 5, 1d supply, fill #0

## 2022-12-25 MED FILL — Diltiazem HCl Coated Beads Cap ER 24HR 180 MG: ORAL | 30 days supply | Qty: 30 | Fill #6 | Status: AC

## 2022-12-26 ENCOUNTER — Other Ambulatory Visit (HOSPITAL_COMMUNITY): Payer: Self-pay

## 2023-01-09 ENCOUNTER — Other Ambulatory Visit (HOSPITAL_COMMUNITY): Payer: Self-pay

## 2023-01-09 ENCOUNTER — Other Ambulatory Visit: Payer: Self-pay | Admitting: Cardiovascular Disease

## 2023-01-10 ENCOUNTER — Other Ambulatory Visit (HOSPITAL_COMMUNITY): Payer: Self-pay

## 2023-01-10 ENCOUNTER — Other Ambulatory Visit: Payer: Self-pay

## 2023-01-10 MED ORDER — ROSUVASTATIN CALCIUM 10 MG PO TABS
10.0000 mg | ORAL_TABLET | Freq: Every day | ORAL | 1 refills | Status: DC
  Filled 2023-01-10: qty 90, 90d supply, fill #0
  Filled 2023-04-04: qty 90, 90d supply, fill #1

## 2023-01-14 ENCOUNTER — Other Ambulatory Visit (HOSPITAL_COMMUNITY): Payer: Self-pay

## 2023-01-16 ENCOUNTER — Other Ambulatory Visit (HOSPITAL_COMMUNITY): Payer: Self-pay

## 2023-01-30 ENCOUNTER — Other Ambulatory Visit: Payer: Self-pay | Admitting: Cardiovascular Disease

## 2023-01-30 ENCOUNTER — Other Ambulatory Visit (HOSPITAL_COMMUNITY): Payer: Self-pay

## 2023-01-30 DIAGNOSIS — I350 Nonrheumatic aortic (valve) stenosis: Secondary | ICD-10-CM | POA: Diagnosis not present

## 2023-01-30 DIAGNOSIS — E7800 Pure hypercholesterolemia, unspecified: Secondary | ICD-10-CM | POA: Diagnosis not present

## 2023-01-30 DIAGNOSIS — D649 Anemia, unspecified: Secondary | ICD-10-CM | POA: Diagnosis not present

## 2023-01-30 DIAGNOSIS — I10 Essential (primary) hypertension: Secondary | ICD-10-CM | POA: Diagnosis not present

## 2023-01-30 DIAGNOSIS — N1832 Chronic kidney disease, stage 3b: Secondary | ICD-10-CM | POA: Diagnosis not present

## 2023-01-30 DIAGNOSIS — I5032 Chronic diastolic (congestive) heart failure: Secondary | ICD-10-CM | POA: Diagnosis not present

## 2023-01-31 ENCOUNTER — Other Ambulatory Visit (HOSPITAL_COMMUNITY): Payer: Self-pay

## 2023-01-31 MED ORDER — DILTIAZEM HCL ER COATED BEADS 180 MG PO CP24
180.0000 mg | ORAL_CAPSULE | Freq: Every day | ORAL | 4 refills | Status: DC
  Filled 2023-01-31: qty 30, 30d supply, fill #0
  Filled 2023-03-11: qty 30, 30d supply, fill #1
  Filled 2023-04-04: qty 30, 30d supply, fill #2
  Filled 2023-04-26: qty 30, 30d supply, fill #3
  Filled 2023-05-26: qty 30, 30d supply, fill #4

## 2023-02-02 ENCOUNTER — Other Ambulatory Visit (HOSPITAL_COMMUNITY): Payer: Self-pay

## 2023-02-10 DIAGNOSIS — Z23 Encounter for immunization: Secondary | ICD-10-CM | POA: Diagnosis not present

## 2023-02-10 NOTE — Progress Notes (Unsigned)
Cardiology Clinic Note   Patient Name: Richard Reed Date of Encounter: 02/13/2023  Primary Care Provider:  Soundra Pilon, FNP Primary Cardiologist:  Nanetta Batty, MD  Patient Profile    Richard Reed 89 year old male presents to the clinic today for follow-up evaluation of his paroxysmal atrial fibrillation and PVCs.  Past Medical History    Past Medical History:  Diagnosis Date   Angina    Aortic valve insufficiency, moderate    a. 02/2016 Echo: mild AS, mild to mod AI.   Arthritis    Arthritis pain    CAD (coronary artery disease)    a. 09/2010 Cath: RCA 40-60 ost, otw nl cors;  b. 03/2016 Lexiscan MV: EF 46%, no ischemia, low risk.   Cancer [facility])    Carotid disease, bilateral (HCC)    a. 02/2016 Carotid U/S: 1-39% bilat ICA plaquing.   Cerebral aneurysm    a. 02/2016 MRA: fusiform bilobed aneurysm @ R MCA bifurcation.   Cerebrovascular disease    a. 02/2016 MRA: long segment narrowing of basilar artery, moderate bilat posterior inferior cerebellar artery dzs.   Chronic combined systolic and diastolic CHF (congestive heart failure) (HCC)    a. 02/2016 Admitted to hospital in [place] w/ CHF;  b. 02/2016 Echo ([facility]): EF 45-50%, diff HK, gr1DD, mild AS, mild to mod AI, mildly dil Ao root, PASP .   CKD (chronic kidney disease), stage III (HCC)    Dysrhythmia    Essential hypertension    Hard of hearing    Hemiparesis and alteration of sensations as late effects of stroke (HCC) 04/29/2015   Hiatal hernia    History of kidney stones    HOH (hard of hearing)    Hyperlipidemia    Kidney stones    Moderate aortic insufficiency    NICM (nonischemic cardiomyopathy) (HCC)    a. 02/2016 Echo: EF 45-50%, diff HK;  b. 03/2016 Lexiscan MV: No ischemia, EF 46%.   NSVT (nonsustained ventricular tachycardia) (HCC)    a. 03/2016 noted on event monitor.   PAF (paroxysmal atrial fibrillation) (HCC)    a. 2012 - noted in setting of E coli bacteremia and septic shock.    Pneumonia 03/23/2011   "first time"   Prostate cancer (HCC)    Sepsis due to Escherichia coli (HCC) 03/24/11   Syncope    a. 02/2016 presumed to be 2/2 hypotension/dehydration and basilar artery dzs noted on MRA.   TIA (transient ischemic attack)    Vertebrobasilar insufficiency 04/28/2016   Visual disturbance 03/19/2014   Past Surgical History:  Procedure Laterality Date   CARDIAC CATHETERIZATION  09/2010   non obstructive CAD, 40-60% ostial RCA stenosis without dampening    CATARACT EXTRACTION Bilateral    cerebral angiography     CHOLECYSTECTOMY  04/15/2011   Procedure: LAPAROSCOPIC CHOLECYSTECTOMY WITH INTRAOPERATIVE CHOLANGIOGRAM;  Surgeon: Jetty Duhamel, MD;  Location: [facility] OR;  Service: General;  Laterality: N/A;   CYSTOSCOPY WITH STENT PLACEMENT Bilateral 04/07/2022   Procedure: CYSTOSCOPY WITH BILATERAL STENT PLACEMENT;  Surgeon: Jerilee Field, MD;  Location: [facility] ORS;  Service: Urology;  Laterality: Bilateral;  30 MINS FRO CASE   CYSTOSCOPY/URETEROSCOPY/HOLMIUM LASER/STENT PLACEMENT Bilateral 05/03/2022   Procedure: CYSTOSCOPY BILATERAL URETEROSCOPY/HOLMIUM LASER/STENT PLACEMENT;  Surgeon: Jerilee Field, MD;  Location: [facility];  Service: Urology;  Laterality: Bilateral;  2 HRS FOR CASE   EXTRACORPOREAL SHOCK WAVE LITHOTRIPSY Left 12/12/2016   Procedure: LEFT EXTRACORPOREAL SHOCK WAVE LITHOTRIPSY (ESWL);  Surgeon: Crist Fat, MD;  Location: [facility];  Service: Urology;  Laterality: Left;   IR ANGIO INTRA EXTRACRAN SEL COM CAROTID INNOMINATE BILAT MOD SED  03/21/2019   IR ANGIO VERTEBRAL SEL SUBCLAVIAN INNOMINATE BILAT MOD SED  03/21/2019   IR ANGIOGRAM SELECTIVE EACH ADDITIONAL VESSEL  04/20/2022   IR ANGIOGRAM SELECTIVE EACH ADDITIONAL VESSEL  04/20/2022   IR ANGIOGRAM VISCERAL SELECTIVE  04/20/2022   IR EMBO ART  VEN HEMORR LYMPH EXTRAV  INC GUIDE ROADMAPPING  04/20/2022   IR GENERIC HISTORICAL  03/17/2016   IR ANGIO VERTEBRAL SEL SUBCLAVIAN INNOMINATE BILAT MOD SED 03/17/2016  Julieanne Cotton, MD [facility]   IR GENERIC HISTORICAL  03/17/2016   IR ANGIO INTRA EXTRACRAN SEL COM CAROTID INNOMINATE BILAT MOD SED 03/17/2016 Julieanne Cotton, MD [facility]   IR US GUIDE VASC ACCESS RIGHT  04/20/2022   PROSTATE SURGERY     approx 10 years ago, seed implant radiation   TONSILLECTOMY      Allergies  No Known Allergies  History of Present Illness    Richard Reed has a PMH of nonobstructive CAD with left heart cath 10/08/2010, paroxysmal atrial fibrillation, aortic insufficiency, LV dysfunction echocardiogram 6/21 showed an EF of 40-45% with global hypokinesis, G1 DD, severe LAE, mild-moderate MR, moderate AI.  His PMH also includes HTN, HLD, TIA, and CKD.  He is a longtime patient of Dr. Allyson Sabal.  He was seen in follow-up by Carlos Levering NP-C on 12/07/2022.  During that time his 2/24 6-day hospitalization for GI bleed was reviewed.  He had also been noted to have nephrolithiasis and underwent urethral stent placement.  He required 3 units of PRBCs.  He was started back on his low-dose Eliquis.  He continued to do well at [facility] independent living.  He was accompanied by his daughter.  He denied shortness of breath and dyspnea.  He did note that occasionally he did not feel like he was able to take a deep breath.  This would occur when he was at rest and was not bothersome to him.  He denied chest pain, pressure, and tightness.  He had no lower extremity swelling.  He denied orthopnea and PND.  He denied palpitations.  He was walking at the facility for exercise.  He was planning to have 2 teeth removed.  He denied bleeding issues.  CHA2DS2-VASc score 6 (hypertension, stroke x 2, vascular disease, age x 2).  His blood pressure was 110/76.  Follow-up was planned for 6 months.  He presents to the clinic today for follow-up evaluation and states he has noticed increased dizziness over the last weeks to months.  He was recently at his PCP who drew a BNP.  He  appears to be euvolemic today.  Lung sounds are clear.  No lower extremity edema.  Weight has decreased.  Elevated BNP appears to be related to age.  Baseline fluid volume status.  He does note that his vision is also decreased.  He is very hard of hearing.  We reviewed options for treatment.  I will send him to the vestibular rehab clinic and plan follow-up in 6 months.  Today he denies chest pain, shortness of breath, lower extremity edema, fatigue, palpitations, melena, hematuria, hemoptysis, diaphoresis, weakness, presyncope, syncope, orthopnea, and PND.    Home Medications    Prior to Admission medications   Medication Sig Start Date End Date Taking? Authorizing Provider  apixaban (ELIQUIS) 2.5 MG TABS tablet Take 1 tablet (2.5 mg total) by mouth 2 (two) times daily.  10/04/22   Runell Gess, MD  B Complex-C (SUPER B COMPLEX PO) Take 1 tablet by mouth daily.    [provider]  chlorhexidine (PERIDEX) 0.12 % solution GENTLY RINSE WITH 15 ML IN THE MOUTH FOR 2 MINUTES TWICE A DAY, EXPECTORATE AFTER USE. 12/15/22     diltiazem (CARDIZEM CD) 180 MG 24 hr capsule Take 1 capsule (180 mg total) by mouth daily. 01/31/23   Runell Gess, MD  feeding supplement, ENSURE ENLIVE, (ENSURE ENLIVE) LIQD Take 237 mLs by mouth 2 (two) times daily between meals. Patient taking differently: Take 237 mLs by mouth daily. 03/02/16   Joseph Art, DO  finasteride (PROSCAR) 5 MG tablet Take 1 tablet (5 mg total) by mouth daily. 02/24/22     furosemide (LASIX) 20 MG tablet Take 1 tablet (20 mg total) by mouth daily. 07/04/22   Runell Gess, MD  metoprolol tartrate (LOPRESSOR) 25 MG tablet Take 1 tablet (25 mg total) by mouth 2 (two) times daily. 07/21/22   Runell Gess, MD  Multiple Vitamins-Minerals (PRESERVISION AREDS 2) CAPS Take 1 capsule by mouth in the morning and at bedtime.    [provider]  pantoprazole (PROTONIX) 40 MG tablet Take 1 tablet (40 mg total) by mouth 2 (two)  times daily. Patient taking differently: Take 40 mg by mouth daily. 12/01/22     Propylene Glycol (SYSTANE BALANCE OP) Place 1 drop into both eyes 2 (two) times daily as needed (for dryness).    [provider]  rosuvastatin (CRESTOR) 10 MG tablet Take 1 tablet (10 mg total) by mouth daily. 01/10/23 01/10/24  Runell Gess, MD  tamsulosin (FLOMAX) 0.4 MG CAPS capsule Take 1 capsule (0.4 mg total) by mouth nightly at bedtime. 02/24/22     traMADol (ULTRAM) 50 MG tablet Take 1 tablet (50 mg total) by mouth every 4-6 hours as needed for pain 12/15/22       Family History    Family History  Problem Relation Age of Onset   Heart failure Mother    Heart disease Mother    Heart disease Father    Cancer Brother        prostate   He indicated that his mother is deceased. He indicated that his father is deceased. He indicated that his sister is alive. He indicated that two of his three brothers are alive. He indicated that his maternal grandmother is deceased. He indicated that his maternal grandfather is deceased. He indicated that his paternal grandmother is deceased. He indicated that his paternal grandfather is deceased. He indicated that both of his daughters are alive.  Social History    Social History   Socioeconomic History   Marital status: Married    Spouse name: Not on file   Number of children: 2   Years of education: HS   Highest education level: Not on file  Occupational History   Occupation: Retired  Tobacco Use   Smoking status: Never   Smokeless tobacco: Never  Vaping Use   Vaping status: Never Used  Substance and Sexual Activity   Alcohol use: No   Drug use: No   Sexual activity: Yes  Other Topics Concern   Not on file  Social History Narrative   Medical Decision Maker:  FLOURNOY BAYERL (wife) Cell. [phone] House: [phone].   Daughter: Kandis Mannan: Cell [phone]   Full Code   Patient is Left handed.   Patient drinks 1 cup of caffeine  daily.  Patient is left handed.   Social Determinants of Health   Financial Resource Strain: Not on file  Food Insecurity: No Food Insecurity (04/21/2022)   Hunger Vital Sign    Worried About Running Out of Food in the Last Year: Never true    Ran Out of Food in the Last Year: Never true  Transportation Needs: No Transportation Needs (04/21/2022)   PRAPARE - Administrator, Civil Service (Medical): No    Lack of Transportation (Non-Medical): No  Physical Activity: Not on file  Stress: Not on file  Social Connections: Not on file  Intimate Partner Violence: Not At Risk (04/21/2022)   Humiliation, Afraid, Rape, and Kick questionnaire    Fear of Current or Ex-Partner: No    Emotionally Abused: No    Physically Abused: No    Sexually Abused: No     Review of Systems    General:  No chills, fever, night sweats or weight changes.  Cardiovascular:  No chest pain, dyspnea on exertion, edema, orthopnea, palpitations, paroxysmal nocturnal dyspnea. Dermatological: No rash, lesions/masses Respiratory: No cough, dyspnea Urologic: No hematuria, dysuria Abdominal:   No nausea, vomiting, diarrhea, bright red blood per rectum, melena, or hematemesis Neurologic:  No visual changes, wkns, changes in mental status. All other systems reviewed and are otherwise negative except as noted above.  Physical Exam    VS:  BP 100/70 (BP Location: Left Arm, Patient Position: Sitting, Cuff Size: Normal)   Pulse 70   Ht 5\' 5"  (1.651 m)   Wt 138 lb 6.4 oz (62.8 kg)   SpO2 95%   BMI 23.03 kg/m  , BMI Body mass index is 23.03 kg/m. GEN: Well nourished, well developed, in no acute distress. HEENT: normal.  Hard of hearing Neck: Supple, no JVD, carotid bruits, or masses. Cardiac: RRR, systolic murmur heard along right sternal border 3/6, rubs, or gallops. No clubbing, cyanosis, edema.  Radials/DP/PT 2+ and equal bilaterally.  Respiratory:  Respirations regular and unlabored, clear to auscultation  bilaterally. GI: Soft, nontender, nondistended, BS + x 4. MS: no deformity or atrophy. Skin: warm and dry, no rash. Neuro:  Strength and sensation are intact. Psych: Normal affect.  Accessory Clinical Findings    Recent Labs: 04/22/2022: Magnesium 1.9 04/24/2022: ALT 13 07/03/2022: B Natriuretic Peptide 265.6; BUN 30; Creatinine, Ser 1.54; Hemoglobin 12.1; Platelets 119; Potassium 3.6; Sodium 137   Recent Lipid Panel    Component Value Date/Time   CHOL 97 (L) 05/22/2017 1056   TRIG 66 05/22/2017 1056   HDL 39 (L) 05/22/2017 1056   CHOLHDL 2.5 05/22/2017 1056   CHOLHDL 2.5 04/25/2016 0813   VLDL 13 04/25/2016 0813   LDLCALC 45 05/22/2017 1056         ECG personally reviewed by me today- EKG Interpretation Date/Time:  Monday February 13 2023 14:20:16 EDT Ventricular Rate:  70 PR Interval:    QRS Duration:  94 QT Interval:  426 QTC Calculation: 460 R Axis:   -38  Text Interpretation: Atrial fibrillation Left axis deviation Left ventricular hypertrophy ( Sokolow-Lyon , Romhilt-Estes ) Nonspecific T wave abnormality Prolonged QT When compared with ECG of 03-Jul-2022 19:06, PREVIOUS ECG IS PRESENT Confirmed by Edd Fabian [phone]) on 02/13/2023 2:42:18 PM     Echocardiogram 09/30/2019  IMPRESSIONS     1. Left ventricular ejection fraction, by estimation, is 40 to 45%. The  left ventricle has mildly decreased function. The left ventricle  demonstrates global hypokinesis. The left ventricular internal cavity size  was  moderately dilated. There is mild  left ventricular hypertrophy of the basal-septal segment. Left ventricular  diastolic parameters are consistent with Grade I diastolic dysfunction  (impaired relaxation). Elevated left ventricular end-diastolic pressure.   2. Right ventricular systolic function is normal. The right ventricular  size is normal. There is normal pulmonary artery systolic pressure.   3. Left atrial size was severely dilated.   4. The mitral valve  is degenerative. Mild to moderate mitral valve  regurgitation. No evidence of mitral stenosis.   5. The aortic valve is tricuspid. Aortic valve regurgitation is moderate.  Moderate aortic valve stenosis. Aortic regurgitation PHT measures 410  msec. Aortic valve area, by VTI measures 1.23 cm. Aortic valve mean  gradient measures 22.0 mmHg. Aortic  valve Vmax measures 3.03 m/s.   6. Aortic dilatation noted. There is mild dilatation of the aortic root  and moderate dilatation of the ascending aorta measuring 41 mm and 46mm.   7. The inferior vena cava is normal in size with greater than 50%  respiratory variability, suggesting right atrial pressure of 3 mmHg.   Comparison(s): 07/16/19 EF 35-40%. Mild AS mean PG, peak PG.  Compared to prior echo 3/21, LVF appears improved. AS appears moderate  with mean AVG and AVA 1.23cm2 with peak AV velociy 58m/s. Aortic  insusficiency is moderate and  eccentrically directed towards the anterior mitral valve leaflet.   FINDINGS   Left Ventricle: Left ventricular ejection fraction, by estimation, is 40  to 45%. The left ventricle has mildly decreased function. The left  ventricle demonstrates global hypokinesis. The left ventricular internal  cavity size was moderately dilated.  There is mild left ventricular hypertrophy of the basal-septal segment.  Left ventricular diastolic parameters are consistent with Grade I  diastolic dysfunction (impaired relaxation). Elevated left ventricular  end-diastolic pressure.   Right Ventricle: The right ventricular size is normal. No increase in  right ventricular wall thickness. Right ventricular systolic function is  normal. There is normal pulmonary artery systolic pressure. The tricuspid  regurgitant velocity is 2.65 m/s, and   with an assumed right atrial pressure of 3 mmHg, the estimated right  ventricular systolic pressure is 31.1 mmHg.   Left Atrium: Left atrial size was severely  dilated.   Right Atrium: Right atrial size was normal in size.   Pericardium: There is no evidence of pericardial effusion.   Mitral Valve: The mitral valve is degenerative in appearance. There is  moderate thickening of the mitral valve leaflet(s). There is mild  calcification of the mitral valve leaflet(s). Normal mobility of the  mitral valve leaflets. Moderate mitral annular  calcification. Mild to moderate mitral valve regurgitation. No evidence of  mitral valve stenosis.   Tricuspid Valve: The tricuspid valve is normal in structure. Tricuspid  valve regurgitation is mild . No evidence of tricuspid stenosis.   Aortic Valve: The aortic valve is tricuspid. . There is moderate  thickening and moderate calcification of the aortic valve. Aortic valve  regurgitation is moderate. Aortic regurgitation PHT measures 410 msec.  Moderate aortic stenosis is present. Moderate  aortic valve annular calcification. There is moderate thickening of the  aortic valve. There is moderate calcification of the aortic valve. Aortic  valve mean gradient measures 22.0 mmHg. Aortic valve peak gradient  measures 36.7 mmHg. Aortic valve area, by   VTI measures 1.23 cm.   Pulmonic Valve: The pulmonic valve was normal in structure. Pulmonic valve  regurgitation is not visualized. No evidence of pulmonic stenosis.  Aorta: Aortic dilatation noted. There is mild dilatation of the aortic  root and of the ascending aorta measuring 41 mm.   Venous: The inferior vena cava is normal in size with greater than 50%  respiratory variability, suggesting right atrial pressure of 3 mmHg.   IAS/Shunts: The interatrial septum appears to be lipomatous. No atrial  level shunt detected by color flow Doppler.    Nuclear stress testing 03/30/2016  The left ventricular ejection fraction is mildly decreased (45-54%). Nuclear stress EF: 46%. There was no ST segment deviation noted during stress. This is a low risk  study.   Normal perfusion EF calculated at 46% but no discrete RWMA;s May be due to frequent ventricular ectopy      Assessment & Plan   1.  Coronary artery disease-denies recent episodes of arm neck back or chest discomfort.  Cardiac catheterization 6/12 showed ostial RCA with 46% stenosis. Continue current medical therapy Heart healthy low-sodium diet Maintain physical activity  Hyperlipidemia-LDL 52 on 07/26/2022. Continue rosuvastatin Fiber diet  Paroxysmal atrial fibrillation-heart rate today 70 bpm.  Had new onset atrial fibrillation in 2012 in the setting of hospitalization for E. coli bacteremia and septic shock.  Had a subsequent episode of atrial fibrillation 3/24 during hospitalization for cellulitis.  Reports compliance with apixaban.  Denies bleeding issues. Continue apixaban, diltiazem, metoprolol  AI, LV dysfunction-no increased DOE or activity intolerance.  Echocardiogram 6/21 showed an EF of 40-45%, G1 DD, aortic stenosis with a mean gradient of 22 mmHg.  Previously felt that he did not want to repeat echocardiogram unless there was a change in his clinical presentation. Maintain weight log Heart healthy low-sodium diet Continue metoprolol, furosemide  Essential hypertension-BP today 100/70. Continue metoprolol, diltiazem Maintain blood pressure log  Dizziness-notes differences in dizziness with changes in head position. Refer to physical therapy for vestibular rehab  Disposition: Follow-up with Dr.Berry or me in 6 months.   Thomasene Ripple. Nicoletta Hush NP-C     02/13/2023, 2:42 PM [facility] [address] Office [phone] Fax [phone]    I spent 14 minutes examining this patient, reviewing medications, and using patient centered shared decision making involving her cardiac care.   I spent greater than 20 minutes reviewing her past medical history,  medications, and prior cardiac tests.

## 2023-02-11 ENCOUNTER — Other Ambulatory Visit (HOSPITAL_COMMUNITY): Payer: Self-pay

## 2023-02-13 ENCOUNTER — Encounter: Payer: Self-pay | Admitting: General Practice

## 2023-02-13 ENCOUNTER — Ambulatory Visit: Payer: Medicare Other | Attending: General Practice | Admitting: General Practice

## 2023-02-13 ENCOUNTER — Other Ambulatory Visit (HOSPITAL_COMMUNITY): Payer: Self-pay

## 2023-02-13 VITALS — BP 100/70 | HR 70 | Ht 65.0 in | Wt 138.4 lb

## 2023-02-13 DIAGNOSIS — I2510 Atherosclerotic heart disease of native coronary artery without angina pectoris: Secondary | ICD-10-CM | POA: Diagnosis not present

## 2023-02-13 DIAGNOSIS — I10 Essential (primary) hypertension: Secondary | ICD-10-CM | POA: Diagnosis not present

## 2023-02-13 DIAGNOSIS — I2583 Coronary atherosclerosis due to lipid rich plaque: Secondary | ICD-10-CM | POA: Diagnosis not present

## 2023-02-13 DIAGNOSIS — I480 Paroxysmal atrial fibrillation: Secondary | ICD-10-CM

## 2023-02-13 DIAGNOSIS — I351 Nonrheumatic aortic (valve) insufficiency: Secondary | ICD-10-CM

## 2023-02-13 DIAGNOSIS — I519 Heart disease, unspecified: Secondary | ICD-10-CM

## 2023-02-13 DIAGNOSIS — E785 Hyperlipidemia, unspecified: Secondary | ICD-10-CM

## 2023-02-13 DIAGNOSIS — R42 Dizziness and giddiness: Secondary | ICD-10-CM

## 2023-02-13 MED ORDER — ERYTHROMYCIN 5 MG/GM OP OINT
TOPICAL_OINTMENT | OPHTHALMIC | 0 refills | Status: DC
  Filled 2023-02-13: qty 3.5, 10d supply, fill #0

## 2023-02-13 NOTE — Patient Instructions (Signed)
Medication Instructions:  The current medical regimen is effective;  continue present plan and medications as directed. Please refer to the Current Medication list given to you today.  *If you need a refill on your cardiac medications before your next appointment, please call your pharmacy*  Lab Work: NONE  Other Instructions REFERRAL TO PHYS THERAPY-VESTIBULAR THERAPY  Follow-Up: At [facility], you and your health needs are our priority.  As part of our continuing mission to provide you with exceptional heart care, we have created designated Provider Care Teams.  These Care Teams include your primary Cardiologist (physician) and Advanced Practice Providers (APPs -  Physician Assistants and Nurse Practitioners) who all work together to provide you with the care you need, when you need it.  We recommend signing up for the patient portal called "MyChart".  Sign up information is provided on this After Visit Summary.  MyChart is used to connect with patients for Virtual Visits (Telemedicine).  Patients are able to view lab/test results, encounter notes, upcoming appointments, etc.  Non-urgent messages can be sent to your provider as well.   To learn more about what you can do with MyChart, go to [email or web address].    Your next appointment:   6 month(s)  Provider:   Nanetta Batty, MD  or Edd Fabian, FNP

## 2023-02-20 NOTE — Therapy (Signed)
OUTPATIENT PHYSICAL THERAPY VESTIBULAR EVALUATION     Patient Name: Richard Reed MRN: [id number] DOB:1925-09-05, 89 y.o., male Today's Date: 02/21/2023  END OF SESSION:  PT End of Session - 02/21/23 1232     Visit Number 1    Number of Visits 13    Date for PT Re-Evaluation 04/04/23    Authorization Type [employer]BS Medicare    PT Start Time 1145    PT Stop Time 1225    PT Time Calculation (min) 40 min    Equipment Utilized During Treatment Gait belt    Activity Tolerance Patient tolerated treatment well    Behavior During Therapy WFL for tasks assessed/performed             Past Medical History:  Diagnosis Date   Angina    Aortic valve insufficiency, moderate    a. 02/2016 Echo: mild AS, mild to mod AI.   Arthritis    Arthritis pain    CAD (coronary artery disease)    a. 09/2010 Cath: RCA 40-60 ost, otw nl cors;  b. 03/2016 Lexiscan MV: EF 46%, no ischemia, low risk.   Cancer [facility])    Carotid disease, bilateral (HCC)    a. 02/2016 Carotid U/S: 1-39% bilat ICA plaquing.   Cerebral aneurysm    a. 02/2016 MRA: fusiform bilobed aneurysm @ R MCA bifurcation.   Cerebrovascular disease    a. 02/2016 MRA: long segment narrowing of basilar artery, moderate bilat posterior inferior cerebellar artery dzs.   Chronic combined systolic and diastolic CHF (congestive heart failure) (HCC)    a. 02/2016 Admitted to hospital in [place] w/ CHF;  b. 02/2016 Echo ([facility]): EF 45-50%, diff HK, gr1DD, mild AS, mild to mod AI, mildly dil Ao root, PASP .   CKD (chronic kidney disease), stage III (HCC)    Dysrhythmia    Essential hypertension    Hard of hearing    Hemiparesis and alteration of sensations as late effects of stroke (HCC) 04/29/2015   Hiatal hernia    History of kidney stones    HOH (hard of hearing)    Hyperlipidemia    Kidney stones    Moderate aortic insufficiency    NICM (nonischemic cardiomyopathy) (HCC)    a. 02/2016 Echo: EF 45-50%, diff HK;  b. 03/2016 Lexiscan MV:  No ischemia, EF 46%.   NSVT (nonsustained ventricular tachycardia) (HCC)    a. 03/2016 noted on event monitor.   PAF (paroxysmal atrial fibrillation) (HCC)    a. 2012 - noted in setting of E coli bacteremia and septic shock.   Pneumonia 03/23/2011   "first time"   Prostate cancer (HCC)    Sepsis due to Escherichia coli (HCC) 03/24/11   Syncope    a. 02/2016 presumed to be 2/2 hypotension/dehydration and basilar artery dzs noted on MRA.   TIA (transient ischemic attack)    Vertebrobasilar insufficiency 04/28/2016   Visual disturbance 03/19/2014   Past Surgical History:  Procedure Laterality Date   CARDIAC CATHETERIZATION  09/2010   non obstructive CAD, 40-60% ostial RCA stenosis without dampening    CATARACT EXTRACTION Bilateral    cerebral angiography     CHOLECYSTECTOMY  04/15/2011   Procedure: LAPAROSCOPIC CHOLECYSTECTOMY WITH INTRAOPERATIVE CHOLANGIOGRAM;  Surgeon: Jetty Duhamel, MD;  Location: [facility] OR;  Service: General;  Laterality: N/A;   CYSTOSCOPY WITH STENT PLACEMENT Bilateral 04/07/2022   Procedure: CYSTOSCOPY WITH BILATERAL STENT PLACEMENT;  Surgeon: Jerilee Field, MD;  Location: [facility] ORS;  Service: Urology;  Laterality: Bilateral;  30 MINS FRO CASE   CYSTOSCOPY/URETEROSCOPY/HOLMIUM LASER/STENT PLACEMENT Bilateral 05/03/2022   Procedure: CYSTOSCOPY BILATERAL URETEROSCOPY/HOLMIUM LASER/STENT PLACEMENT;  Surgeon: Jerilee Field, MD;  Location: [facility];  Service: Urology;  Laterality: Bilateral;  2 HRS FOR CASE   EXTRACORPOREAL SHOCK WAVE LITHOTRIPSY Left 12/12/2016   Procedure: LEFT EXTRACORPOREAL SHOCK WAVE LITHOTRIPSY (ESWL);  Surgeon: Crist Fat, MD;  Location: [facility];  Service: Urology;  Laterality: Left;   IR ANGIO INTRA EXTRACRAN SEL COM CAROTID INNOMINATE BILAT MOD SED  03/21/2019   IR ANGIO VERTEBRAL SEL SUBCLAVIAN INNOMINATE BILAT MOD SED  03/21/2019   IR ANGIOGRAM SELECTIVE EACH ADDITIONAL VESSEL  04/20/2022   IR ANGIOGRAM SELECTIVE EACH ADDITIONAL VESSEL   04/20/2022   IR ANGIOGRAM VISCERAL SELECTIVE  04/20/2022   IR EMBO ART  VEN HEMORR LYMPH EXTRAV  INC GUIDE ROADMAPPING  04/20/2022   IR GENERIC HISTORICAL  03/17/2016   IR ANGIO VERTEBRAL SEL SUBCLAVIAN INNOMINATE BILAT MOD SED 03/17/2016 Julieanne Cotton, MD [facility]   IR GENERIC HISTORICAL  03/17/2016   IR ANGIO INTRA EXTRACRAN SEL COM CAROTID INNOMINATE BILAT MOD SED 03/17/2016 Julieanne Cotton, MD [facility]   IR US GUIDE VASC ACCESS RIGHT  04/20/2022   PROSTATE SURGERY     approx 10 years ago, seed implant radiation   TONSILLECTOMY     Patient Active Problem List   Diagnosis Date Noted   GI bleed due to NSAIDs 04/20/2022   Chronic anticoagulation 06/09/2020   History of stroke 06/09/2020   Left ventricular dysfunction 09/10/2019   Rectal bleed 12/01/2018   Asymptomatic PVCs 05/22/2017   Vertebrobasilar insufficiency 04/28/2016   Coronary artery disease due to lipid rich plaque 04/26/2016   Calcified pleural plaque due to asbestos exposure 03/31/2016   Syncope 02/29/2016   LOC (loss of consciousness) (HCC) 02/28/2016   UTI (urinary tract infection) 02/28/2016   Hemiparesis and alteration of sensations as late effects of stroke (HCC) 04/29/2015   TIA (transient ischemic attack) 12/02/2014   Visual disturbance 03/19/2014   Diplopia 03/19/2014   Essential hypertension 04/30/2013   Dyslipidemia 04/30/2013   PAF (paroxysmal atrial fibrillation) (HCC) 10/29/2012   Aortic valve insufficiency, moderate 10/29/2012   Heme positive stool 07/17/2012   Biliary colic 04/13/2011   Prostate CA (HCC) 04/13/2011   Thrombocytopenia (HCC) 03/24/2011   Encephalopathy 03/24/2011   Metabolic acidosis 03/24/2011   Acute on chronic renal failure (HCC) 03/23/2011   Hypotension 03/23/2011   Hard of hearing 03/23/2011   Chronic renal insufficiency, stage III (moderate) (HCC) 03/23/2011    PCP: Soundra Pilon, FNP  REFERRING PROVIDER: Ronney Asters, NP  REFERRING DIAG: R42 (ICD-10-CM)  - Dizziness  THERAPY DIAG:  Unsteadiness on feet  Other abnormalities of gait and mobility  Muscle weakness (generalized)  Dizziness and giddiness  ONSET DATE: "a long time'  Rationale for Evaluation and Treatment: Rehabilitation  SUBJECTIVE:   SUBJECTIVE STATEMENT: Pt reports feeling off balance when he stands up and when he looks down. Denies dizziness or spinning. This has been going on for a "long time." Had 1 fall in the past 6 months and denies injury. Using RW inside and outside. Daughter mentions R knee gives out occasionally. Daughter reports that he was diagnosed with "down beating nystagmus" by an eye Dr. and this has been an issue for years and they do not know the cause. Pt does not have a neurologist but has a hx of stroke vs. TIA.   Pt accompanied by: family member daughter   PERTINENT HISTORY: Angina,  CAD, cerebral aneurysm, CHF, HTN, HOH, stroke 2017, HLD, a-fib, NSVT, prostate CA s/p surgery and radiation, VBI Per daughter- reports pt has a hx of "downbeating nystagmus" diagnosed by an eye Dr. Without known cause. Reports this has been ongoing for years   PAIN:  Are you having pain? No  PRECAUTIONS: Fall and Other: known VBI ;  very HOH ( better in R)  RED FLAGS: None   WEIGHT BEARING RESTRICTIONS: No  FALLS: Has patient fallen in last 6 months? Yes. Number of falls 1  LIVING ENVIRONMENT: Lives with: lives with their spouse  Lives in: House/apartment (independent living, however daughter helps with shopping and driving; wife has dementia) Stairs:  none Has following equipment at home: Single point cane, Environmental consultant - 2 wheeled, Environmental consultant - 4 wheeled, and Grab bars  PLOF: Independent with basic ADLs  PATIENT GOALS: improve balance   OBJECTIVE:  Note: Objective measures were completed at Evaluation unless otherwise noted.  DIAGNOSTIC FINDINGS: none recent   COGNITION: Overall cognitive status: Difficulty to assess due to: HOH   SENSATION: Reports  occasional N/T in UEs and LEs    POSTURE:  rounded shoulders, forward head, increased thoracic kyphosis, and flexed trunk   PALPATION: no TTP over R knee  ROM: 8 deg with knee compensating B with ankle DF AROM  LOWER EXTREMITY MMT:   MMT (in sitting) Right eval Left eval  Hip flexion 4- 4  Hip abduction 4 4  Hip adduction 4+ 4+  Hip internal rotation    Hip external rotation    Knee flexion 4 4+  Knee extension *gave way d/t knee pain  4+  Ankle dorsiflexion 4 4  Ankle plantarflexion 4 4  Ankle inversion    Ankle eversion    (Blank rows = not tested)   GAIT: Gait pattern: slightly flexed over walker, short step length Assistive device utilized: Walker - 2 wheeled Level of assistance: SBA  FUNCTIONAL TESTS:  5 times sit to stand: 18.48 sec pushing off armrests Timed up and go (TUG): 26.09 sec Berg Balance Scale: 30/56  [facility] PT Assessment - 02/21/23 0001       Standardized Balance Assessment   Standardized Balance Assessment Berg Balance Test      Berg Balance Test   Sit to Stand Able to stand  independently using hands    Standing Unsupported Able to stand safely 2 minutes    Sitting with Back Unsupported but Feet Supported on Floor or Stool Able to sit safely and securely 2 minutes    Stand to Sit Controls descent by using hands    Transfers Able to transfer safely, definite need of hands    Standing Unsupported with Eyes Closed Able to stand 10 seconds with supervision    Standing Unsupported with Feet Together Able to place feet together independently and stand for 1 minute with supervision    From Standing, Reach Forward with Outstretched Arm Can reach forward >12 cm safely (5")    From Standing Position, Pick up Object from Floor Unable to try/needs assist to keep balance    From Standing Position, Turn to Look Behind Over each Shoulder Needs supervision when turning    Turn 360 Degrees Needs close supervision or verbal cueing    Standing Unsupported,  Alternately Place Feet on Step/Stool Able to complete >2 steps/needs minimal assist    Standing Unsupported, One Foot in Front Needs help to step but can hold 15 seconds    Standing on One Leg Unable to  try or needs assist to prevent fall    Total Score 30    Berg comment: reported dizziness upon sitting after testing              PATIENT SURVEYS:  FOTO pt denied dizziness until end of appointment    VESTIBULAR TREATMENT:                                                                                                   DATE: 02/21/23  PATIENT EDUCATION: Education details: prognosis, POC, HEP Person educated: Patient and Child(ren) Education method: Explanation, Demonstration, Tactile cues, Verbal cues, and Handouts Education comprehension: verbalized understanding and returned demonstration  HOME EXERCISE PROGRAM: Access Code: [email or web address] URL: [email or web address] Date: 02/21/2023 Prepared by: [facility] - Outpatient  Rehab - [facility]  Exercises - Seated Long Arc Quad  - 1 x daily - 5 x weekly - 2 sets - 10 reps - Standing March with Counter Support  - 1 x daily - 5 x weekly - 2 sets - 10 reps - Mini Squat with Counter Support  - 1 x daily - 5 x weekly - 2 sets - 10 reps   GOALS: Goals reviewed with patient? Yes  SHORT TERM GOALS: Target date: 03/14/2023  Patient to be independent with initial HEP. Baseline: HEP initiated Goal status: INITIAL    LONG TERM GOALS: Target date: 04/04/2023  Patient to be independent with advanced HEP. Baseline: Not yet initiated  Goal status: INITIAL  Patient will report 0/10 dizziness with bed mobility.  Baseline: NT Goal status: INITIAL  Patient to demonstrate TUG with LRAD in <15 sec in order to reduce risk of falls.  Baseline: Timed up and go (TUG): 26.09 sec Goal status: INITIAL  Patient to score at least 40/56 on Berg in order to decrease risk of falls. Baseline: 30/56 Goal status: INITIAL  Patient  to demonstrate 5xSTS in <15 sec with minimal use of UEs for improved transfer efficiency.  Baseline: 5 times sit to stand: 18.48 sec pushing off armrests Goal status: INITIAL    ASSESSMENT:  CLINICAL IMPRESSION:   Patient is a 89 y/o M presenting to OPPT with daughter with c/o unsteadiness for the past several years. Denies dizziness or spinning. Reports 1 fall in the past 6 months. Patient today presenting with Limited B ankle DF AROM, rounded posture, R>L LE weakness, gait deviations, and imbalance. All balance testing performed today indicates an increased risk of falls. Patient also did report dizziness at very end of session after completing balance testing, thus will proceed with vestibular assessment next session. Patient was educated on gentle strengthening HEP and reported understanding. Would benefit from skilled PT services 1-2x/week for 6 weeks to address aforementioned impairments in order to optimize level of function.    OBJECTIVE IMPAIRMENTS: Abnormal gait, decreased activity tolerance, decreased balance, decreased endurance, difficulty walking, decreased ROM, decreased strength, dizziness, impaired flexibility, and postural dysfunction.   ACTIVITY LIMITATIONS: carrying, lifting, bending, sitting, standing, squatting, stairs, transfers, bathing, toileting, dressing, reach over head, hygiene/grooming, and locomotion level  PARTICIPATION LIMITATIONS: meal prep, cleaning, laundry, shopping, community  activity, and church  PERSONAL FACTORS: Age, Past/current experiences, Time since onset of injury/illness/exacerbation, Transportation, and 3+ comorbidities: Angina, CAD, cerebral aneurysm, CHF, HTN, HOH, stroke 2017, HLD, a-fib, NSVT, prostate CA s/p surgery and radiation, VBI  are also affecting patient's functional outcome.   REHAB POTENTIAL: Good  CLINICAL DECISION MAKING: Evolving/moderate complexity  EVALUATION COMPLEXITY: Moderate   PLAN:  PT FREQUENCY: 1-2x/week  PT  DURATION: 6 weeks  PLANNED INTERVENTIONS: 97164- PT Re-evaluation, 97110-Therapeutic exercises, 97530- Therapeutic activity, 97112- Neuromuscular re-education, 97535- Self Care, 36644- Manual therapy, [phone]- Gait training, [phone]- Canalith repositioning, 97014- Electrical stimulation (unattended), Patient/Family education, Balance training, Stair training, Taping, Dry Needling, Vestibular training, Cryotherapy, and Moist heat  PLAN FOR NEXT SESSION: vestibular assessment; per daughter's request- assess if 4WW would be safe for pt to use, progress balance and LE strengthening activities    Anette Guarneri, PT, DPT 02/21/23 12:43 PM  [facility] at [facility] [address], [address] [place], [place] [address] Phone # [phone] Fax # [phone]

## 2023-02-21 ENCOUNTER — Other Ambulatory Visit: Payer: Self-pay

## 2023-02-21 ENCOUNTER — Ambulatory Visit: Payer: Medicare Other | Attending: General Practice | Admitting: Physical Therapy

## 2023-02-21 ENCOUNTER — Encounter: Payer: Self-pay | Admitting: Physical Therapy

## 2023-02-21 DIAGNOSIS — M6281 Muscle weakness (generalized): Secondary | ICD-10-CM | POA: Insufficient documentation

## 2023-02-21 DIAGNOSIS — R42 Dizziness and giddiness: Secondary | ICD-10-CM | POA: Diagnosis not present

## 2023-02-21 DIAGNOSIS — R2689 Other abnormalities of gait and mobility: Secondary | ICD-10-CM | POA: Diagnosis not present

## 2023-02-21 DIAGNOSIS — R2681 Unsteadiness on feet: Secondary | ICD-10-CM | POA: Diagnosis not present

## 2023-02-28 ENCOUNTER — Ambulatory Visit: Payer: Medicare Other | Admitting: Physical Therapy

## 2023-02-28 ENCOUNTER — Encounter: Payer: Self-pay | Admitting: Physical Therapy

## 2023-02-28 DIAGNOSIS — R2681 Unsteadiness on feet: Secondary | ICD-10-CM

## 2023-02-28 DIAGNOSIS — R42 Dizziness and giddiness: Secondary | ICD-10-CM

## 2023-02-28 DIAGNOSIS — R2689 Other abnormalities of gait and mobility: Secondary | ICD-10-CM | POA: Diagnosis not present

## 2023-02-28 DIAGNOSIS — M6281 Muscle weakness (generalized): Secondary | ICD-10-CM | POA: Diagnosis not present

## 2023-02-28 NOTE — Therapy (Signed)
OUTPATIENT PHYSICAL THERAPY VESTIBULAR NOTE     Patient Name: Richard Reed MRN: [id number] DOB:06-06-1925, 89 y.o., male Today's Date: 02/28/2023  END OF SESSION:  PT End of Session - 02/28/23 1359     Visit Number 2    Number of Visits 13    Date for PT Re-Evaluation 04/04/23    Authorization Type [employer] Medicare    PT Start Time 1400    PT Stop Time 1450    PT Time Calculation (min) 50 min    Equipment Utilized During Treatment Gait belt    Activity Tolerance Patient tolerated treatment well    Behavior During Therapy WFL for tasks assessed/performed              Past Medical History:  Diagnosis Date   Angina    Aortic valve insufficiency, moderate    a. 02/2016 Echo: mild AS, mild to mod AI.   Arthritis    Arthritis pain    CAD (coronary artery disease)    a. 09/2010 Cath: RCA 40-60 ost, otw nl cors;  b. 03/2016 Lexiscan MV: EF 46%, no ischemia, low risk.   Cancer [facility])    Carotid disease, bilateral (HCC)    a. 02/2016 Carotid U/S: 1-39% bilat ICA plaquing.   Cerebral aneurysm    a. 02/2016 MRA: fusiform bilobed aneurysm @ R MCA bifurcation.   Cerebrovascular disease    a. 02/2016 MRA: long segment narrowing of basilar artery, moderate bilat posterior inferior cerebellar artery dzs.   Chronic combined systolic and diastolic CHF (congestive heart failure) (HCC)    a. 02/2016 Admitted to hospital in [place] w/ CHF;  b. 02/2016 Echo ([facility]): EF 45-50%, diff HK, gr1DD, mild AS, mild to mod AI, mildly dil Ao root, PASP .   CKD (chronic kidney disease), stage III (HCC)    Dysrhythmia    Essential hypertension    Hard of hearing    Hemiparesis and alteration of sensations as late effects of stroke (HCC) 04/29/2015   Hiatal hernia    History of kidney stones    HOH (hard of hearing)    Hyperlipidemia    Kidney stones    Moderate aortic insufficiency    NICM (nonischemic cardiomyopathy) (HCC)    a. 02/2016 Echo: EF 45-50%, diff HK;  b. 03/2016 Lexiscan MV: No  ischemia, EF 46%.   NSVT (nonsustained ventricular tachycardia) (HCC)    a. 03/2016 noted on event monitor.   PAF (paroxysmal atrial fibrillation) (HCC)    a. 2012 - noted in setting of E coli bacteremia and septic shock.   Pneumonia 03/23/2011   "first time"   Prostate cancer (HCC)    Sepsis due to Escherichia coli (HCC) 03/24/11   Syncope    a. 02/2016 presumed to be 2/2 hypotension/dehydration and basilar artery dzs noted on MRA.   TIA (transient ischemic attack)    Vertebrobasilar insufficiency 04/28/2016   Visual disturbance 03/19/2014   Past Surgical History:  Procedure Laterality Date   CARDIAC CATHETERIZATION  09/2010   non obstructive CAD, 40-60% ostial RCA stenosis without dampening    CATARACT EXTRACTION Bilateral    cerebral angiography     CHOLECYSTECTOMY  04/15/2011   Procedure: LAPAROSCOPIC CHOLECYSTECTOMY WITH INTRAOPERATIVE CHOLANGIOGRAM;  Surgeon: Jetty Duhamel, MD;  Location: [facility] OR;  Service: General;  Laterality: N/A;   CYSTOSCOPY WITH STENT PLACEMENT Bilateral 04/07/2022   Procedure: CYSTOSCOPY WITH BILATERAL STENT PLACEMENT;  Surgeon: Jerilee Field, MD;  Location: [facility] ORS;  Service: Urology;  Laterality: Bilateral;  30 MINS FRO CASE   CYSTOSCOPY/URETEROSCOPY/HOLMIUM LASER/STENT PLACEMENT Bilateral 05/03/2022   Procedure: CYSTOSCOPY BILATERAL URETEROSCOPY/HOLMIUM LASER/STENT PLACEMENT;  Surgeon: Jerilee Field, MD;  Location: [facility];  Service: Urology;  Laterality: Bilateral;  2 HRS FOR CASE   EXTRACORPOREAL SHOCK WAVE LITHOTRIPSY Left 12/12/2016   Procedure: LEFT EXTRACORPOREAL SHOCK WAVE LITHOTRIPSY (ESWL);  Surgeon: Crist Fat, MD;  Location: [facility];  Service: Urology;  Laterality: Left;   IR ANGIO INTRA EXTRACRAN SEL COM CAROTID INNOMINATE BILAT MOD SED  03/21/2019   IR ANGIO VERTEBRAL SEL SUBCLAVIAN INNOMINATE BILAT MOD SED  03/21/2019   IR ANGIOGRAM SELECTIVE EACH ADDITIONAL VESSEL  04/20/2022   IR ANGIOGRAM SELECTIVE EACH ADDITIONAL VESSEL  04/20/2022    IR ANGIOGRAM VISCERAL SELECTIVE  04/20/2022   IR EMBO ART  VEN HEMORR LYMPH EXTRAV  INC GUIDE ROADMAPPING  04/20/2022   IR GENERIC HISTORICAL  03/17/2016   IR ANGIO VERTEBRAL SEL SUBCLAVIAN INNOMINATE BILAT MOD SED 03/17/2016 Julieanne Cotton, MD [facility]   IR GENERIC HISTORICAL  03/17/2016   IR ANGIO INTRA EXTRACRAN SEL COM CAROTID INNOMINATE BILAT MOD SED 03/17/2016 Julieanne Cotton, MD [facility]   IR US GUIDE VASC ACCESS RIGHT  04/20/2022   PROSTATE SURGERY     approx 10 years ago, seed implant radiation   TONSILLECTOMY     Patient Active Problem List   Diagnosis Date Noted   GI bleed due to NSAIDs 04/20/2022   Chronic anticoagulation 06/09/2020   History of stroke 06/09/2020   Left ventricular dysfunction 09/10/2019   Rectal bleed 12/01/2018   Asymptomatic PVCs 05/22/2017   Vertebrobasilar insufficiency 04/28/2016   Coronary artery disease due to lipid rich plaque 04/26/2016   Calcified pleural plaque due to asbestos exposure 03/31/2016   Syncope 02/29/2016   LOC (loss of consciousness) (HCC) 02/28/2016   UTI (urinary tract infection) 02/28/2016   Hemiparesis and alteration of sensations as late effects of stroke (HCC) 04/29/2015   TIA (transient ischemic attack) 12/02/2014   Visual disturbance 03/19/2014   Diplopia 03/19/2014   Essential hypertension 04/30/2013   Dyslipidemia 04/30/2013   PAF (paroxysmal atrial fibrillation) (HCC) 10/29/2012   Aortic valve insufficiency, moderate 10/29/2012   Heme positive stool 07/17/2012   Biliary colic 04/13/2011   Prostate CA (HCC) 04/13/2011   Thrombocytopenia (HCC) 03/24/2011   Encephalopathy 03/24/2011   Metabolic acidosis 03/24/2011   Acute on chronic renal failure (HCC) 03/23/2011   Hypotension 03/23/2011   Hard of hearing 03/23/2011   Chronic renal insufficiency, stage III (moderate) (HCC) 03/23/2011    PCP: Soundra Pilon, FNP  REFERRING PROVIDER: Ronney Asters, NP  REFERRING DIAG: R42 (ICD-10-CM) -  Dizziness  THERAPY DIAG:  Unsteadiness on feet  Other abnormalities of gait and mobility  Dizziness and giddiness  ONSET DATE: "a long time'  Rationale for Evaluation and Treatment: Rehabilitation  SUBJECTIVE:   SUBJECTIVE STATEMENT: No changes since last visit.  Just sleepy today.    Pt accompanied by: family member daughter   PERTINENT HISTORY: Angina, CAD, cerebral aneurysm, CHF, HTN, HOH, stroke 2017, HLD, a-fib, NSVT, prostate CA s/p surgery and radiation, VBI Per daughter- reports pt has a hx of "downbeating nystagmus" diagnosed by an eye Dr. Without known cause. Reports this has been ongoing for years   PAIN:  Are you having pain? No  PRECAUTIONS: Fall and Other: known VBI ;  very HOH ( better in R)  RED FLAGS: None   WEIGHT BEARING RESTRICTIONS: No  FALLS: Has patient fallen in last 6 months? Yes.  Number of falls 1  LIVING ENVIRONMENT: Lives with: lives with their spouse  Lives in: House/apartment (independent living, however daughter helps with shopping and driving; wife has dementia) Stairs:  none Has following equipment at home: Single point cane, Environmental consultant - 2 wheeled, Environmental consultant - 4 wheeled, and Grab bars  PLOF: Independent with basic ADLs  PATIENT GOALS: improve balance   OBJECTIVE:   VESTIBULAR ASSESSMENT   GENERAL OBSERVATION: Pleasant and in no acute distress    SYMPTOM BEHAVIOR:   Subjective history: Reports sometimes lightheaded feeling and spinning feeling sometimes when I get up first thing in the morning.  Stagger when I first stand up in the morning.    Non-Vestibular symptoms: changes in vision and reports weaker distance vision (to see eye MD next week)   Type of dizziness: Imbalance (Disequilibrium), Spinning/Vertigo, and Lightheadedness/Faint   Frequency: anytime I get up   Duration: seconds   Aggravating factors: Induced by position change: rolling to the right and rolling to the left and Induced by motion: bending down to the ground,  turning body quickly, and turning head quickly   Relieving factors: head stationary and slow movements   Progression of symptoms: unchanged   OCULOMOTOR EXAM:   Ocular Alignment: abnormal;  downbeating nystagmus R eye; extraocular movements R eye with smooth pursuits   Ocular ROM: No Limitations   Spontaneous Nystagmus:  present   Gaze-Induced Nystagmus:  present   Smooth Pursuits: intact   Saccades: extra eye movements and reports 6/10 dizziness   Convergence/Divergence: NT cm      VESTIBULAR - OCULAR REFLEX:    Slow VOR: Comment: rates some dizziness, lightheaded and returns to baseline horizontal; vertical difficulty maintaining target   VOR Cancellation: Normal   Head-Impulse Test: HIT Right: negative HIT Left: positive   Dynamic Visual Acuity:  NT    M-CTSIB  Condition 1: Firm Surface, EO 30 Sec, Mild Sway  Condition 2: Firm Surface, EC 30 Sec, Moderate Sway  Condition 3: Foam Surface, EO 30 Sec, Moderate Sway  Condition 4: Foam Surface, EC 7 Sec, Severe Sway     POSITIONAL TESTING:  Right Roll Test: nystagmus (baseline); Duration: none Left Roll Test: no symptoms; Duration: none Right Dix-Hallpike: nystagmus almost appeared L rotatory upwards; Duration:>60 sec Left Dix-Hallpike: L upbeating rotatory nystagmus; Duration: 15 sec    Performed L Epley x 1, with pt tolerating well.  Pt does c/o mild dizziness in position 3 and upon return to sit.  PATIENT EDUCATION: Education details: vestibular assessment findings, positional vertigo testing and treatment Person educated: Patient and Child(ren) Education method: Explanation Education comprehension: verbalized understanding     ------------------------------------------------------------ Note: Objective measures below were completed at Evaluation unless otherwise noted.  DIAGNOSTIC FINDINGS: none recent   COGNITION: Overall cognitive status: Difficulty to assess due to: HOH   SENSATION: Reports occasional N/T  in UEs and LEs    POSTURE:  rounded shoulders, forward head, increased thoracic kyphosis, and flexed trunk   PALPATION: no TTP over R knee  ROM: 8 deg with knee compensating B with ankle DF AROM  LOWER EXTREMITY MMT:   MMT (in sitting) Right eval Left eval  Hip flexion 4- 4  Hip abduction 4 4  Hip adduction 4+ 4+  Hip internal rotation    Hip external rotation    Knee flexion 4 4+  Knee extension *gave way d/t knee pain  4+  Ankle dorsiflexion 4 4  Ankle plantarflexion 4 4  Ankle inversion    Ankle eversion    (  Blank rows = not tested)   GAIT: Gait pattern: slightly flexed over walker, short step length Assistive device utilized: Walker - 2 wheeled Level of assistance: SBA  FUNCTIONAL TESTS:  5 times sit to stand: 18.48 sec pushing off armrests Timed up and go (TUG): 26.09 sec Berg Balance Scale: 30/56     PATIENT SURVEYS:  FOTO pt denied dizziness until end of appointment    VESTIBULAR TREATMENT:                                                                                                   DATE: 02/21/23  PATIENT EDUCATION: Education details: prognosis, POC, HEP Person educated: Patient and Child(ren) Education method: Explanation, Demonstration, Tactile cues, Verbal cues, and Handouts Education comprehension: verbalized understanding and returned demonstration  HOME EXERCISE PROGRAM: Access Code: [email or web address] URL: [email or web address] Date: 02/21/2023 Prepared by: [facility] - Outpatient  Rehab - [facility]  Exercises - Seated Long Arc Quad  - 1 x daily - 5 x weekly - 2 sets - 10 reps - Standing March with Counter Support  - 1 x daily - 5 x weekly - 2 sets - 10 reps - Mini Squat with Counter Support  - 1 x daily - 5 x weekly - 2 sets - 10 reps   GOALS: Goals reviewed with patient? Yes  SHORT TERM GOALS: Target date: 03/14/2023  Patient to be independent with initial HEP. Baseline: HEP initiated Goal status:  INITIAL    LONG TERM GOALS: Target date: 04/04/2023  Patient to be independent with advanced HEP. Baseline: Not yet initiated  Goal status: INITIAL  Patient will report 0/10 dizziness with bed mobility.  Baseline: NT Goal status: INITIAL  Patient to demonstrate TUG with LRAD in <15 sec in order to reduce risk of falls.  Baseline: Timed up and go (TUG): 26.09 sec Goal status: INITIAL  Patient to score at least 40/56 on Berg in order to decrease risk of falls. Baseline: 30/56 Goal status: INITIAL  Patient to demonstrate 5xSTS in <15 sec with minimal use of UEs for improved transfer efficiency.  Baseline: 5 times sit to stand: 18.48 sec pushing off armrests Goal status: INITIAL    ASSESSMENT:  CLINICAL IMPRESSION: ***  Patient is a 89 y/o M presenting to OPPT with daughter with c/o unsteadiness for the past several years. Denies dizziness or spinning. Reports 1 fall in the past 6 months. Patient today presenting with Limited B ankle DF AROM, rounded posture, R>L LE weakness, gait deviations, and imbalance. All balance testing performed today indicates an increased risk of falls. Patient also did report dizziness at very end of session after completing balance testing, thus will proceed with vestibular assessment next session. Patient was educated on gentle strengthening HEP and reported understanding. Would benefit from skilled PT services 1-2x/week for 6 weeks to address aforementioned impairments in order to optimize level of function.    OBJECTIVE IMPAIRMENTS: Abnormal gait, decreased activity tolerance, decreased balance, decreased endurance, difficulty walking, decreased ROM, decreased strength, dizziness, impaired flexibility, and postural dysfunction.   ACTIVITY LIMITATIONS: carrying, lifting, bending, sitting, standing, squatting, stairs,  transfers, bathing, toileting, dressing, reach over head, hygiene/grooming, and locomotion level  PARTICIPATION LIMITATIONS: meal prep,  cleaning, laundry, shopping, community activity, and church  PERSONAL FACTORS: Age, Past/current experiences, Time since onset of injury/illness/exacerbation, Transportation, and 3+ comorbidities: Angina, CAD, cerebral aneurysm, CHF, HTN, HOH, stroke 2017, HLD, a-fib, NSVT, prostate CA s/p surgery and radiation, VBI  are also affecting patient's functional outcome.   REHAB POTENTIAL: Good  CLINICAL DECISION MAKING: Evolving/moderate complexity  EVALUATION COMPLEXITY: Moderate   PLAN:  PT FREQUENCY: 1-2x/week  PT DURATION: 6 weeks  PLANNED INTERVENTIONS: 97164- PT Re-evaluation, 97110-Therapeutic exercises, 97530- Therapeutic activity, 97112- Neuromuscular re-education, 97535- Self Care, 34742- Manual therapy, [phone]- Gait training, [phone]- Canalith repositioning, 97014- Electrical stimulation (unattended), Patient/Family education, Balance training, Stair training, Taping, Dry Needling, Vestibular training, Cryotherapy, and Moist heat  PLAN FOR NEXT SESSION: ***vestibular assessment; per daughter's request- assess if 4WW would be safe for pt to use, progress balance and LE strengthening activities    Lonia Blood, PT 02/28/23 2:55 PM Phone: [phone] Fax: [phone]  [facility] at [facility] [address], [address] [place], [place] [address] Phone # [phone] Fax # [phone]

## 2023-03-02 ENCOUNTER — Other Ambulatory Visit (HOSPITAL_COMMUNITY): Payer: Self-pay

## 2023-03-02 DIAGNOSIS — R3914 Feeling of incomplete bladder emptying: Secondary | ICD-10-CM | POA: Diagnosis not present

## 2023-03-02 DIAGNOSIS — N401 Enlarged prostate with lower urinary tract symptoms: Secondary | ICD-10-CM | POA: Diagnosis not present

## 2023-03-02 MED ORDER — FINASTERIDE 5 MG PO TABS
7.5000 mg | ORAL_TABLET | Freq: Every day | ORAL | 3 refills | Status: DC
  Filled 2023-03-02: qty 90, 60d supply, fill #0

## 2023-03-02 MED ORDER — TAMSULOSIN HCL 0.4 MG PO CAPS
0.4000 mg | ORAL_CAPSULE | Freq: Every day | ORAL | 3 refills | Status: DC
  Filled 2023-03-02: qty 90, 90d supply, fill #0
  Filled 2023-05-26: qty 90, 90d supply, fill #1
  Filled 2023-09-07: qty 90, 90d supply, fill #2
  Filled 2023-12-07: qty 90, 90d supply, fill #3

## 2023-03-06 ENCOUNTER — Other Ambulatory Visit (HOSPITAL_COMMUNITY): Payer: Self-pay

## 2023-03-07 ENCOUNTER — Other Ambulatory Visit (HOSPITAL_COMMUNITY): Payer: Self-pay

## 2023-03-07 ENCOUNTER — Encounter: Payer: Self-pay | Admitting: Physical Therapy

## 2023-03-07 ENCOUNTER — Ambulatory Visit: Payer: Medicare Other | Admitting: Physical Therapy

## 2023-03-07 DIAGNOSIS — H04123 Dry eye syndrome of bilateral lacrimal glands: Secondary | ICD-10-CM | POA: Diagnosis not present

## 2023-03-07 DIAGNOSIS — H0102B Squamous blepharitis left eye, upper and lower eyelids: Secondary | ICD-10-CM | POA: Diagnosis not present

## 2023-03-07 DIAGNOSIS — R2689 Other abnormalities of gait and mobility: Secondary | ICD-10-CM

## 2023-03-07 DIAGNOSIS — R42 Dizziness and giddiness: Secondary | ICD-10-CM | POA: Diagnosis not present

## 2023-03-07 DIAGNOSIS — H0102A Squamous blepharitis right eye, upper and lower eyelids: Secondary | ICD-10-CM | POA: Diagnosis not present

## 2023-03-07 DIAGNOSIS — R2681 Unsteadiness on feet: Secondary | ICD-10-CM

## 2023-03-07 DIAGNOSIS — D3131 Benign neoplasm of right choroid: Secondary | ICD-10-CM | POA: Diagnosis not present

## 2023-03-07 DIAGNOSIS — M6281 Muscle weakness (generalized): Secondary | ICD-10-CM | POA: Diagnosis not present

## 2023-03-07 NOTE — Therapy (Signed)
OUTPATIENT PHYSICAL THERAPY VESTIBULAR NOTE     Patient Name: Richard Reed MRN: [id number] DOB:03-13-1926, 89 y.o., male Today's Date: 03/07/2023  END OF SESSION:  PT End of Session - 03/07/23 1315     Visit Number 3    Number of Visits 13    Date for PT Re-Evaluation 04/04/23    Authorization Type [employer] Medicare    PT Start Time 1317    PT Stop Time 1359    PT Time Calculation (min) 42 min    Equipment Utilized During Treatment Gait belt    Activity Tolerance Patient tolerated treatment well    Behavior During Therapy WFL for tasks assessed/performed               Past Medical History:  Diagnosis Date   Angina    Aortic valve insufficiency, moderate    a. 02/2016 Echo: mild AS, mild to mod AI.   Arthritis    Arthritis pain    CAD (coronary artery disease)    a. 09/2010 Cath: RCA 40-60 ost, otw nl cors;  b. 03/2016 Lexiscan MV: EF 46%, no ischemia, low risk.   Cancer [facility])    Carotid disease, bilateral (HCC)    a. 02/2016 Carotid U/S: 1-39% bilat ICA plaquing.   Cerebral aneurysm    a. 02/2016 MRA: fusiform bilobed aneurysm @ R MCA bifurcation.   Cerebrovascular disease    a. 02/2016 MRA: long segment narrowing of basilar artery, moderate bilat posterior inferior cerebellar artery dzs.   Chronic combined systolic and diastolic CHF (congestive heart failure) (HCC)    a. 02/2016 Admitted to hospital in [place] w/ CHF;  b. 02/2016 Echo ([facility]): EF 45-50%, diff HK, gr1DD, mild AS, mild to mod AI, mildly dil Ao root, PASP .   CKD (chronic kidney disease), stage III (HCC)    Dysrhythmia    Essential hypertension    Hard of hearing    Hemiparesis and alteration of sensations as late effects of stroke (HCC) 04/29/2015   Hiatal hernia    History of kidney stones    HOH (hard of hearing)    Hyperlipidemia    Kidney stones    Moderate aortic insufficiency    NICM (nonischemic cardiomyopathy) (HCC)    a. 02/2016 Echo: EF 45-50%, diff HK;  b. 03/2016 Lexiscan MV:  No ischemia, EF 46%.   NSVT (nonsustained ventricular tachycardia) (HCC)    a. 03/2016 noted on event monitor.   PAF (paroxysmal atrial fibrillation) (HCC)    a. 2012 - noted in setting of E coli bacteremia and septic shock.   Pneumonia 03/23/2011   "first time"   Prostate cancer (HCC)    Sepsis due to Escherichia coli (HCC) 03/24/11   Syncope    a. 02/2016 presumed to be 2/2 hypotension/dehydration and basilar artery dzs noted on MRA.   TIA (transient ischemic attack)    Vertebrobasilar insufficiency 04/28/2016   Visual disturbance 03/19/2014   Past Surgical History:  Procedure Laterality Date   CARDIAC CATHETERIZATION  09/2010   non obstructive CAD, 40-60% ostial RCA stenosis without dampening    CATARACT EXTRACTION Bilateral    cerebral angiography     CHOLECYSTECTOMY  04/15/2011   Procedure: LAPAROSCOPIC CHOLECYSTECTOMY WITH INTRAOPERATIVE CHOLANGIOGRAM;  Surgeon: Jetty Duhamel, MD;  Location: [facility] OR;  Service: General;  Laterality: N/A;   CYSTOSCOPY WITH STENT PLACEMENT Bilateral 04/07/2022   Procedure: CYSTOSCOPY WITH BILATERAL STENT PLACEMENT;  Surgeon: Jerilee Field, MD;  Location: [facility] ORS;  Service: Urology;  Laterality:  Bilateral;  30 MINS FRO CASE   CYSTOSCOPY/URETEROSCOPY/HOLMIUM LASER/STENT PLACEMENT Bilateral 05/03/2022   Procedure: CYSTOSCOPY BILATERAL URETEROSCOPY/HOLMIUM LASER/STENT PLACEMENT;  Surgeon: Jerilee Field, MD;  Location: [facility];  Service: Urology;  Laterality: Bilateral;  2 HRS FOR CASE   EXTRACORPOREAL SHOCK WAVE LITHOTRIPSY Left 12/12/2016   Procedure: LEFT EXTRACORPOREAL SHOCK WAVE LITHOTRIPSY (ESWL);  Surgeon: Crist Fat, MD;  Location: [facility];  Service: Urology;  Laterality: Left;   IR ANGIO INTRA EXTRACRAN SEL COM CAROTID INNOMINATE BILAT MOD SED  03/21/2019   IR ANGIO VERTEBRAL SEL SUBCLAVIAN INNOMINATE BILAT MOD SED  03/21/2019   IR ANGIOGRAM SELECTIVE EACH ADDITIONAL VESSEL  04/20/2022   IR ANGIOGRAM SELECTIVE EACH ADDITIONAL VESSEL   04/20/2022   IR ANGIOGRAM VISCERAL SELECTIVE  04/20/2022   IR EMBO ART  VEN HEMORR LYMPH EXTRAV  INC GUIDE ROADMAPPING  04/20/2022   IR GENERIC HISTORICAL  03/17/2016   IR ANGIO VERTEBRAL SEL SUBCLAVIAN INNOMINATE BILAT MOD SED 03/17/2016 Julieanne Cotton, MD [facility]   IR GENERIC HISTORICAL  03/17/2016   IR ANGIO INTRA EXTRACRAN SEL COM CAROTID INNOMINATE BILAT MOD SED 03/17/2016 Julieanne Cotton, MD [facility]   IR US GUIDE VASC ACCESS RIGHT  04/20/2022   PROSTATE SURGERY     approx 10 years ago, seed implant radiation   TONSILLECTOMY     Patient Active Problem List   Diagnosis Date Noted   GI bleed due to NSAIDs 04/20/2022   Chronic anticoagulation 06/09/2020   History of stroke 06/09/2020   Left ventricular dysfunction 09/10/2019   Rectal bleed 12/01/2018   Asymptomatic PVCs 05/22/2017   Vertebrobasilar insufficiency 04/28/2016   Coronary artery disease due to lipid rich plaque 04/26/2016   Calcified pleural plaque due to asbestos exposure 03/31/2016   Syncope 02/29/2016   LOC (loss of consciousness) (HCC) 02/28/2016   UTI (urinary tract infection) 02/28/2016   Hemiparesis and alteration of sensations as late effects of stroke (HCC) 04/29/2015   TIA (transient ischemic attack) 12/02/2014   Visual disturbance 03/19/2014   Diplopia 03/19/2014   Essential hypertension 04/30/2013   Dyslipidemia 04/30/2013   PAF (paroxysmal atrial fibrillation) (HCC) 10/29/2012   Aortic valve insufficiency, moderate 10/29/2012   Heme positive stool 07/17/2012   Biliary colic 04/13/2011   Prostate CA (HCC) 04/13/2011   Thrombocytopenia (HCC) 03/24/2011   Encephalopathy 03/24/2011   Metabolic acidosis 03/24/2011   Acute on chronic renal failure (HCC) 03/23/2011   Hypotension 03/23/2011   Hard of hearing 03/23/2011   Chronic renal insufficiency, stage III (moderate) (HCC) 03/23/2011    PCP: Soundra Pilon, FNP  REFERRING PROVIDER: Ronney Asters, NP  REFERRING DIAG: R42 (ICD-10-CM)  - Dizziness  THERAPY DIAG:  Dizziness and giddiness  Unsteadiness on feet  Other abnormalities of gait and mobility  ONSET DATE: "a long time'  Rationale for Evaluation and Treatment: Rehabilitation  SUBJECTIVE:   SUBJECTIVE STATEMENT: Had eye exam earlier today so eyes are dilated.  Not really having as much dizziness.  Going to get new glasses. Daughter asks about use of rollator.  Pt accompanied by: family member daughter   PERTINENT HISTORY: Angina, CAD, cerebral aneurysm, CHF, HTN, HOH, stroke 2017, HLD, a-fib, NSVT, prostate CA s/p surgery and radiation, VBI Per daughter- reports pt has a hx of "downbeating nystagmus" diagnosed by an eye Dr. Without known cause. Reports this has been ongoing for years   PAIN:  Are you having pain? No  PRECAUTIONS: Fall and Other: known VBI ;  very HOH ( better in R)  RED FLAGS: None   WEIGHT BEARING RESTRICTIONS: No  FALLS: Has patient fallen in last 6 months? Yes. Number of falls 1  LIVING ENVIRONMENT: Lives with: lives with their spouse  Lives in: House/apartment (independent living, however daughter helps with shopping and driving; wife has dementia) Stairs:  none Has following equipment at home: Single point cane, Environmental consultant - 2 wheeled, Environmental consultant - 4 wheeled, and Grab bars  PLOF: Independent with basic ADLs  PATIENT GOALS: improve balance   OBJECTIVE:    TODAY'S TREATMENT: 03/07/2023 Activity Comments  Gait with 4WW:  60 ft , then 120 ft Cues to stay close to walker and to slow pace with turns  Gait with 4WW around obstacles/furniture Cues to slow pace with turns, supervision  Gait with RW 50 ft x 2 Keeps close to rollator, supervision  R DH Rotational nystagmus (? Up to L) >1 minute, dizzy upon return to sit  LDH L upbeating nystagmus, approx 30 sec  Treated L Epley Good tolerance, slowed to fatigue with nystagmus each position  L DH L upbeating nystagmus, 15-30 sec  L Epley Tolerates well, mild dizziness upon sitting   Standing feet apart EC, feet together EC, 30 sec each BUE>1 UE>no support with min guard   PATIENT EDUCATION: Education details: continue use of RW at home as pt is slower and safer with this at this time; will continue to use and trial 4WW in PT sessions and educate on appropriateness of use outside of therapy as able Person educated: Patient and Child(ren) Education method: Explanation, Demonstration, and Verbal cues Education comprehension: verbalized understanding    ------------------------------------------------------------------------- VESTIBULAR ASSESSMENT   GENERAL OBSERVATION: Pleasant and in no acute distress    SYMPTOM BEHAVIOR:   Subjective history: Reports sometimes lightheaded feeling and spinning feeling sometimes when I get up first thing in the morning.  Stagger when I first stand up in the morning.    Non-Vestibular symptoms: changes in vision and reports weaker distance vision (to see eye MD next week)   Type of dizziness: Imbalance (Disequilibrium), Spinning/Vertigo, and Lightheadedness/Faint   Frequency: anytime I get up   Duration: seconds   Aggravating factors: Induced by position change: rolling to the right and rolling to the left and Induced by motion: bending down to the ground, turning body quickly, and turning head quickly   Relieving factors: head stationary and slow movements   Progression of symptoms: unchanged   OCULOMOTOR EXAM:   Ocular Alignment: abnormal;  downbeating nystagmus R eye; extraocular movements R eye with smooth pursuits   Ocular ROM: No Limitations   Spontaneous Nystagmus:  present   Gaze-Induced Nystagmus:  present   Smooth Pursuits: intact   Saccades: extra eye movements and reports 6/10 dizziness   Convergence/Divergence: NT cm      VESTIBULAR - OCULAR REFLEX:    Slow VOR: Comment: rates some dizziness, lightheaded and returns to baseline horizontal; vertical difficulty maintaining target   VOR Cancellation:  Normal   Head-Impulse Test: HIT Right: negative HIT Left: positive   Dynamic Visual Acuity:  NT    M-CTSIB  Condition 1: Firm Surface, EO 30 Sec, Mild Sway  Condition 2: Firm Surface, EC 30 Sec, Moderate Sway  Condition 3: Foam Surface, EO 30 Sec, Moderate Sway  Condition 4: Foam Surface, EC 2-7 Sec, Severe Sway     POSITIONAL TESTING:  Right Roll Test: nystagmus (baseline); Duration: none Left Roll Test: no symptoms; Duration: none Right Dix-Hallpike: nystagmus almost appeared L rotatory upwards; Duration:>60 sec Left Dix-Hallpike:  L upbeating rotatory nystagmus; Duration: 15 sec    Performed L Epley x 1, with pt tolerating well.  Pt does c/o mild dizziness in position 3 and upon return to sit.  PATIENT EDUCATION: Education details: vestibular assessment findings, positional vertigo testing and treatment Person educated: Patient and Child(ren) Education method: Explanation Education comprehension: verbalized understanding     ------------------------------------------------------------ Note: Objective measures below were completed at Evaluation unless otherwise noted.  DIAGNOSTIC FINDINGS: none recent   COGNITION: Overall cognitive status: Difficulty to assess due to: HOH   SENSATION: Reports occasional N/T in UEs and LEs    POSTURE:  rounded shoulders, forward head, increased thoracic kyphosis, and flexed trunk   PALPATION: no TTP over R knee  ROM: 8 deg with knee compensating B with ankle DF AROM  LOWER EXTREMITY MMT:   MMT (in sitting) Right eval Left eval  Hip flexion 4- 4  Hip abduction 4 4  Hip adduction 4+ 4+  Hip internal rotation    Hip external rotation    Knee flexion 4 4+  Knee extension *gave way d/t knee pain  4+  Ankle dorsiflexion 4 4  Ankle plantarflexion 4 4  Ankle inversion    Ankle eversion    (Blank rows = not tested)   GAIT: Gait pattern: slightly flexed over walker, short step length Assistive device utilized: Walker - 2  wheeled Level of assistance: SBA  FUNCTIONAL TESTS:  5 times sit to stand: 18.48 sec pushing off armrests Timed up and go (TUG): 26.09 sec Berg Balance Scale: 30/56     PATIENT SURVEYS:  FOTO pt denied dizziness until end of appointment    VESTIBULAR TREATMENT:                                                                                                   DATE: 02/21/23  PATIENT EDUCATION: Education details: prognosis, POC, HEP Person educated: Patient and Child(ren) Education method: Explanation, Demonstration, Tactile cues, Verbal cues, and Handouts Education comprehension: verbalized understanding and returned demonstration  HOME EXERCISE PROGRAM: Access Code: [email or web address] URL: [email or web address] Date: 02/21/2023 Prepared by: [facility] - Outpatient  Rehab - [facility]  Exercises - Seated Long Arc Quad  - 1 x daily - 5 x weekly - 2 sets - 10 reps - Standing March with Counter Support  - 1 x daily - 5 x weekly - 2 sets - 10 reps - Mini Squat with Counter Support  - 1 x daily - 5 x weekly - 2 sets - 10 reps   GOALS: Goals reviewed with patient? Yes  SHORT TERM GOALS: Target date: 03/14/2023  Patient to be independent with initial HEP. Baseline: HEP initiated Goal status: INITIAL    LONG TERM GOALS: Target date: 04/04/2023  Patient to be independent with advanced HEP. Baseline: Not yet initiated  Goal status: INITIAL  Patient will report 0/10 dizziness with bed mobility.  Baseline: NT Goal status: INITIAL  Patient to demonstrate TUG with LRAD in <15 sec in order to reduce risk of falls.  Baseline: Timed up and go (TUG): 26.09 sec Goal status: INITIAL  Patient to score at least 40/56 on Berg in order to decrease risk of falls. Baseline: 30/56 Goal status: INITIAL  Patient to demonstrate 5xSTS in <15 sec with minimal use of UEs for improved transfer efficiency.  Baseline: 5 times sit to stand: 18.48 sec pushing off armrests Goal  status: INITIAL    ASSESSMENT:  CLINICAL IMPRESSION: Pt presents today after eye exam earlier today, with eyes having been dilated.  He reports he has to get new eyeglasses.  He does report overall less dizziness since last session.  Proceeded with positional testing today and pt continues to have nystagmus, L upbeating rotary with L DH and variable, non-fatigueing rotational nystagmus on R side.  Treated L Epley again today, x 2, with pt having dizziness and spinning in multiple positions, and mild dizziness upon return to sit EOM.  Not fully sure that vertigo is cleared, but deferred further workup and treatment on this today due to pt's eyes being dilated.  Did work on trial of gait with 4WW in level, clinic setting, with pt tending to push 4WW too far in front of him and taking turns quickly.  He has used a 4WW in the past, but the RW appears more safe for him at this current time; will continue to trial and educate on safety with 4WW in PT sessions.  He will continue to benefit from skill PT towards goals for improved functional mobility, balance, decreased dizziness.  OBJECTIVE IMPAIRMENTS: Abnormal gait, decreased activity tolerance, decreased balance, decreased endurance, difficulty walking, decreased ROM, decreased strength, dizziness, impaired flexibility, and postural dysfunction.   ACTIVITY LIMITATIONS: carrying, lifting, bending, sitting, standing, squatting, stairs, transfers, bathing, toileting, dressing, reach over head, hygiene/grooming, and locomotion level  PARTICIPATION LIMITATIONS: meal prep, cleaning, laundry, shopping, community activity, and church  PERSONAL FACTORS: Age, Past/current experiences, Time since onset of injury/illness/exacerbation, Transportation, and 3+ comorbidities: Angina, CAD, cerebral aneurysm, CHF, HTN, HOH, stroke 2017, HLD, a-fib, NSVT, prostate CA s/p surgery and radiation, VBI  are also affecting patient's functional outcome.   REHAB POTENTIAL:  Good  CLINICAL DECISION MAKING: Evolving/moderate complexity  EVALUATION COMPLEXITY: Moderate   PLAN:  PT FREQUENCY: 1-2x/week  PT DURATION: 6 weeks  PLANNED INTERVENTIONS: 97164- PT Re-evaluation, 97110-Therapeutic exercises, 97530- Therapeutic activity, 97112- Neuromuscular re-education, 97535- Self Care, 29562- Manual therapy, [id number]- Gait training, [phone]- Canalith repositioning, 97014- Electrical stimulation (unattended), Patient/Family education, Balance training, Stair training, Taping, Dry Needling, Vestibular training, Cryotherapy, and Moist heat  PLAN FOR NEXT SESSION: Reassess posterior canal BPPV and treat as appropriate; may need to look at [facility] for habituation. Continue to work with [phone] with cues for slowed pace and turning practice; progress balance and BLE strengthening activities    Lonia Blood, PT 03/07/23 2:02 PM Phone: [phone] Fax: [phone]  [facility] at [facility] [address] [place], [address] [place], [place] [address] Phone # [phone] Fax # [phone]

## 2023-03-08 ENCOUNTER — Encounter: Payer: Self-pay | Admitting: Physical Therapy

## 2023-03-08 ENCOUNTER — Ambulatory Visit: Payer: Medicare Other | Admitting: Physical Therapy

## 2023-03-08 DIAGNOSIS — R42 Dizziness and giddiness: Secondary | ICD-10-CM

## 2023-03-08 DIAGNOSIS — R2681 Unsteadiness on feet: Secondary | ICD-10-CM

## 2023-03-08 DIAGNOSIS — R2689 Other abnormalities of gait and mobility: Secondary | ICD-10-CM | POA: Diagnosis not present

## 2023-03-08 DIAGNOSIS — M6281 Muscle weakness (generalized): Secondary | ICD-10-CM | POA: Diagnosis not present

## 2023-03-08 NOTE — Therapy (Signed)
OUTPATIENT PHYSICAL THERAPY VESTIBULAR NOTE     Patient Name: Richard Reed MRN: [id number] DOB:03/18/1926, 89 y.o., male Today's Date: 03/08/2023  END OF SESSION:  PT End of Session - 03/08/23 1408     Visit Number 4    Number of Visits 13    Date for PT Re-Evaluation 04/04/23    Authorization Type [employer]BS Medicare    PT Start Time 1403    PT Stop Time 1445    PT Time Calculation (min) 42 min    Equipment Utilized During Treatment --    Activity Tolerance Patient tolerated treatment well    Behavior During Therapy WFL for tasks assessed/performed                Past Medical History:  Diagnosis Date   Angina    Aortic valve insufficiency, moderate    a. 02/2016 Echo: mild AS, mild to mod AI.   Arthritis    Arthritis pain    CAD (coronary artery disease)    a. 09/2010 Cath: RCA 40-60 ost, otw nl cors;  b. 03/2016 Lexiscan MV: EF 46%, no ischemia, low risk.   Cancer [facility])    Carotid disease, bilateral (HCC)    a. 02/2016 Carotid U/S: 1-39% bilat ICA plaquing.   Cerebral aneurysm    a. 02/2016 MRA: fusiform bilobed aneurysm @ R MCA bifurcation.   Cerebrovascular disease    a. 02/2016 MRA: long segment narrowing of basilar artery, moderate bilat posterior inferior cerebellar artery dzs.   Chronic combined systolic and diastolic CHF (congestive heart failure) (HCC)    a. 02/2016 Admitted to hospital in [place] w/ CHF;  b. 02/2016 Echo ([facility]): EF 45-50%, diff HK, gr1DD, mild AS, mild to mod AI, mildly dil Ao root, PASP .   CKD (chronic kidney disease), stage III (HCC)    Dysrhythmia    Essential hypertension    Hard of hearing    Hemiparesis and alteration of sensations as late effects of stroke (HCC) 04/29/2015   Hiatal hernia    History of kidney stones    HOH (hard of hearing)    Hyperlipidemia    Kidney stones    Moderate aortic insufficiency    NICM (nonischemic cardiomyopathy) (HCC)    a. 02/2016 Echo: EF 45-50%, diff HK;  b. 03/2016 Lexiscan MV: No  ischemia, EF 46%.   NSVT (nonsustained ventricular tachycardia) (HCC)    a. 03/2016 noted on event monitor.   PAF (paroxysmal atrial fibrillation) (HCC)    a. 2012 - noted in setting of E coli bacteremia and septic shock.   Pneumonia 03/23/2011   "first time"   Prostate cancer (HCC)    Sepsis due to Escherichia coli (HCC) 03/24/11   Syncope    a. 02/2016 presumed to be 2/2 hypotension/dehydration and basilar artery dzs noted on MRA.   TIA (transient ischemic attack)    Vertebrobasilar insufficiency 04/28/2016   Visual disturbance 03/19/2014   Past Surgical History:  Procedure Laterality Date   CARDIAC CATHETERIZATION  09/2010   non obstructive CAD, 40-60% ostial RCA stenosis without dampening    CATARACT EXTRACTION Bilateral    cerebral angiography     CHOLECYSTECTOMY  04/15/2011   Procedure: LAPAROSCOPIC CHOLECYSTECTOMY WITH INTRAOPERATIVE CHOLANGIOGRAM;  Surgeon: Jetty Duhamel, MD;  Location: [facility] OR;  Service: General;  Laterality: N/A;   CYSTOSCOPY WITH STENT PLACEMENT Bilateral 04/07/2022   Procedure: CYSTOSCOPY WITH BILATERAL STENT PLACEMENT;  Surgeon: Jerilee Field, MD;  Location: [facility];  Service: Urology;  Laterality:  Bilateral;  30 MINS FRO CASE   CYSTOSCOPY/URETEROSCOPY/HOLMIUM LASER/STENT PLACEMENT Bilateral 05/03/2022   Procedure: CYSTOSCOPY BILATERAL URETEROSCOPY/HOLMIUM LASER/STENT PLACEMENT;  Surgeon: Jerilee Field, MD;  Location: [facility];  Service: Urology;  Laterality: Bilateral;  2 HRS FOR CASE   EXTRACORPOREAL SHOCK WAVE LITHOTRIPSY Left 12/12/2016   Procedure: LEFT EXTRACORPOREAL SHOCK WAVE LITHOTRIPSY (ESWL);  Surgeon: Crist Fat, MD;  Location: [facility];  Service: Urology;  Laterality: Left;   IR ANGIO INTRA EXTRACRAN SEL COM CAROTID INNOMINATE BILAT MOD SED  03/21/2019   IR ANGIO VERTEBRAL SEL SUBCLAVIAN INNOMINATE BILAT MOD SED  03/21/2019   IR ANGIOGRAM SELECTIVE EACH ADDITIONAL VESSEL  04/20/2022   IR ANGIOGRAM SELECTIVE EACH ADDITIONAL VESSEL  04/20/2022    IR ANGIOGRAM VISCERAL SELECTIVE  04/20/2022   IR EMBO ART  VEN HEMORR LYMPH EXTRAV  INC GUIDE ROADMAPPING  04/20/2022   IR GENERIC HISTORICAL  03/17/2016   IR ANGIO VERTEBRAL SEL SUBCLAVIAN INNOMINATE BILAT MOD SED 03/17/2016 Julieanne Cotton, MD [facility]   IR GENERIC HISTORICAL  03/17/2016   IR ANGIO INTRA EXTRACRAN SEL COM CAROTID INNOMINATE BILAT MOD SED 03/17/2016 Julieanne Cotton, MD [facility]   IR US GUIDE VASC ACCESS RIGHT  04/20/2022   PROSTATE SURGERY     approx 10 years ago, seed implant radiation   TONSILLECTOMY     Patient Active Problem List   Diagnosis Date Noted   GI bleed due to NSAIDs 04/20/2022   Chronic anticoagulation 06/09/2020   History of stroke 06/09/2020   Left ventricular dysfunction 09/10/2019   Rectal bleed 12/01/2018   Asymptomatic PVCs 05/22/2017   Vertebrobasilar insufficiency 04/28/2016   Coronary artery disease due to lipid rich plaque 04/26/2016   Calcified pleural plaque due to asbestos exposure 03/31/2016   Syncope 02/29/2016   LOC (loss of consciousness) (HCC) 02/28/2016   UTI (urinary tract infection) 02/28/2016   Hemiparesis and alteration of sensations as late effects of stroke (HCC) 04/29/2015   TIA (transient ischemic attack) 12/02/2014   Visual disturbance 03/19/2014   Diplopia 03/19/2014   Essential hypertension 04/30/2013   Dyslipidemia 04/30/2013   PAF (paroxysmal atrial fibrillation) (HCC) 10/29/2012   Aortic valve insufficiency, moderate 10/29/2012   Heme positive stool 07/17/2012   Biliary colic 04/13/2011   Prostate CA (HCC) 04/13/2011   Thrombocytopenia (HCC) 03/24/2011   Encephalopathy 03/24/2011   Metabolic acidosis 03/24/2011   Acute on chronic renal failure (HCC) 03/23/2011   Hypotension 03/23/2011   Hard of hearing 03/23/2011   Chronic renal insufficiency, stage III (moderate) (HCC) 03/23/2011    PCP: Soundra Pilon, FNP  REFERRING PROVIDER: Ronney Asters, NP  REFERRING DIAG: R42 (ICD-10-CM) -  Dizziness  THERAPY DIAG:  Dizziness and giddiness  Unsteadiness on feet  ONSET DATE: "a long time'  Rationale for Evaluation and Treatment: Rehabilitation  SUBJECTIVE:   SUBJECTIVE STATEMENT: Pt and daughter report dizziness has been worse today.  He got up with more dizziness and has just felt off balance most of the day.  Pt accompanied by: family member daughter   PERTINENT HISTORY: Angina, CAD, cerebral aneurysm, CHF, HTN, HOH, stroke 2017, HLD, a-fib, NSVT, prostate CA s/p surgery and radiation, VBI Per daughter- reports pt has a hx of "downbeating nystagmus" diagnosed by an eye Dr. Without known cause. Reports this has been ongoing for years   PAIN:  Are you having pain? No  PRECAUTIONS: Fall and Other: known VBI ;  very HOH ( better in R)  RED FLAGS: None   WEIGHT BEARING RESTRICTIONS: No  FALLS: Has patient fallen in last 6 months? Yes. Number of falls 1  LIVING ENVIRONMENT: Lives with: lives with their spouse  Lives in: House/apartment (independent living, however daughter helps with shopping and driving; wife has dementia) Stairs:  none Has following equipment at home: Single point cane, Environmental consultant - 2 wheeled, Environmental consultant - 4 wheeled, and Grab bars  PLOF: Independent with basic ADLs  PATIENT GOALS: improve balance   OBJECTIVE:  (Underlying downbeating nystagmus-consistent from eval)  TODAY'S TREATMENT: 03/08/2023 Activity Comments  Orthostatic BP measures Supine after 5 minutes Standing after 1 minute Standing after 3 minutes  119/78, HR 65 120/76, HR 69 122/74, HR 68  R DH Intense nystagmus, 15 sec, intense report of spinning  L DH Rotary nystagmus upbeating 30-60 sec, dizziness upon return to sit  R Epley Tolerates well; mild dizziness upon return to sit EOM  R DH Less intense nystagmus about 15 sec, describes more "jumping" than spinning; mild dizziness upon return to sit EOM (less than first time)      PATIENT EDUCATION: Education details:  possibility of multi-canal involvement, treated with R Epley today; discussed continuing to stay well-hydrated with increase overall general activity; discussed slowed pace with transitional movements; discussed Brandt-Daroff, but pt/daughter report he would likely not be able to do this on his own Person educated: Patient and Child(ren) Education method: Explanation, Demonstration, and Verbal cues Education comprehension: verbalized understanding   TODAY'S TREATMENT: 03/07/2023 Activity Comments  Gait with 4WW:  60 ft , then 120 ft Cues to stay close to walker and to slow pace with turns  Gait with 4WW around obstacles/furniture Cues to slow pace with turns, supervision  Gait with RW 50 ft x 2 Keeps close to rollator, supervision  R DH Rotational nystagmus (? Up to L) >1 minute, dizzy upon return to sit  LDH L upbeating nystagmus, approx 30 sec  Treated L Epley Good tolerance, slowed to fatigue with nystagmus each position  L DH L upbeating nystagmus, 15-30 sec  L Epley Tolerates well, mild dizziness upon sitting  Standing feet apart EC, feet together EC, 30 sec each BUE>1 UE>no support with min guard  *L and R roll    No nystagmus or symptoms*    ------------------------------------------------------------------------- VESTIBULAR ASSESSMENT   GENERAL OBSERVATION: Pleasant and in no acute distress    SYMPTOM BEHAVIOR:   Subjective history: Reports sometimes lightheaded feeling and spinning feeling sometimes when I get up first thing in the morning.  Stagger when I first stand up in the morning.    Non-Vestibular symptoms: changes in vision and reports weaker distance vision (to see eye MD next week)   Type of dizziness: Imbalance (Disequilibrium), Spinning/Vertigo, and Lightheadedness/Faint   Frequency: anytime I get up   Duration: seconds   Aggravating factors: Induced by position change: rolling to the right and rolling to the left and Induced by motion: bending down to the ground,  turning body quickly, and turning head quickly   Relieving factors: head stationary and slow movements   Progression of symptoms: unchanged   OCULOMOTOR EXAM:   Ocular Alignment: abnormal;  downbeating nystagmus R eye; extraocular movements R eye with smooth pursuits   Ocular ROM: No Limitations   Spontaneous Nystagmus:  present   Gaze-Induced Nystagmus:  present   Smooth Pursuits: intact   Saccades: extra eye movements and reports 6/10 dizziness   Convergence/Divergence: NT cm      VESTIBULAR - OCULAR REFLEX:    Slow VOR: Comment: rates some dizziness, lightheaded and  returns to baseline horizontal; vertical difficulty maintaining target   VOR Cancellation: Normal   Head-Impulse Test: HIT Right: negative HIT Left: positive   Dynamic Visual Acuity:  NT    M-CTSIB  Condition 1: Firm Surface, EO 30 Sec, Mild Sway  Condition 2: Firm Surface, EC 30 Sec, Moderate Sway  Condition 3: Foam Surface, EO 30 Sec, Moderate Sway  Condition 4: Foam Surface, EC 2-7 Sec, Severe Sway     POSITIONAL TESTING:  Right Roll Test: nystagmus (baseline); Duration: none Left Roll Test: no symptoms; Duration: none Right Dix-Hallpike: nystagmus almost appeared L rotatory upwards; Duration:>60 sec Left Dix-Hallpike: L upbeating rotatory nystagmus; Duration: 15 sec    Performed L Epley x 1, with pt tolerating well.  Pt does c/o mild dizziness in position 3 and upon return to sit.  PATIENT EDUCATION: Education details: vestibular assessment findings, positional vertigo testing and treatment Person educated: Patient and Child(ren) Education method: Explanation Education comprehension: verbalized understanding     ------------------------------------------------------------ Note: Objective measures below were completed at Evaluation unless otherwise noted.  DIAGNOSTIC FINDINGS: none recent   COGNITION: Overall cognitive status: Difficulty to assess due to: HOH   SENSATION: Reports occasional N/T  in UEs and LEs    POSTURE:  rounded shoulders, forward head, increased thoracic kyphosis, and flexed trunk   PALPATION: no TTP over R knee  ROM: 8 deg with knee compensating B with ankle DF AROM  LOWER EXTREMITY MMT:   MMT (in sitting) Right eval Left eval  Hip flexion 4- 4  Hip abduction 4 4  Hip adduction 4+ 4+  Hip internal rotation    Hip external rotation    Knee flexion 4 4+  Knee extension *gave way d/t knee pain  4+  Ankle dorsiflexion 4 4  Ankle plantarflexion 4 4  Ankle inversion    Ankle eversion    (Blank rows = not tested)   GAIT: Gait pattern: slightly flexed over walker, short step length Assistive device utilized: Walker - 2 wheeled Level of assistance: SBA  FUNCTIONAL TESTS:  5 times sit to stand: 18.48 sec pushing off armrests Timed up and go (TUG): 26.09 sec Berg Balance Scale: 30/56     PATIENT SURVEYS:  FOTO pt denied dizziness until end of appointment    VESTIBULAR TREATMENT:                                                                                                   DATE: 02/21/23  PATIENT EDUCATION: Education details: prognosis, POC, HEP Person educated: Patient and Child(ren) Education method: Explanation, Demonstration, Tactile cues, Verbal cues, and Handouts Education comprehension: verbalized understanding and returned demonstration  HOME EXERCISE PROGRAM: Access Code: [email or web address] URL: [email or web address] Date: 02/21/2023 Prepared by: [facility] - Outpatient  Rehab - [facility]  Exercises - Seated Long Arc Quad  - 1 x daily - 5 x weekly - 2 sets - 10 reps - Standing March with Counter Support  - 1 x daily - 5 x weekly - 2 sets - 10 reps - Mini Squat with Counter Support  - 1 x daily -  5 x weekly - 2 sets - 10 reps   GOALS: Goals reviewed with patient? Yes  SHORT TERM GOALS: Target date: 03/14/2023  Patient to be independent with initial HEP. Baseline: HEP initiated Goal status:  INITIAL    LONG TERM GOALS: Target date: 04/04/2023  Patient to be independent with advanced HEP. Baseline: Not yet initiated  Goal status: INITIAL  Patient will report 0/10 dizziness with bed mobility.  Baseline: NT Goal status: INITIAL  Patient to demonstrate TUG with LRAD in <15 sec in order to reduce risk of falls.  Baseline: Timed up and go (TUG): 26.09 sec Goal status: INITIAL  Patient to score at least 40/56 on Berg in order to decrease risk of falls. Baseline: 30/56 Goal status: INITIAL  Patient to demonstrate 5xSTS in <15 sec with minimal use of UEs for improved transfer efficiency.  Baseline: 5 times sit to stand: 18.48 sec pushing off armrests Goal status: INITIAL    ASSESSMENT:  CLINICAL IMPRESSION: Pt returns to OPPT today, reporting feeling more off balance and dizzy since visit yesterday.  Daughter reports he was visibly more unsteady today.  Assessed orthostatic BP measures, as pt reports the dizziness with changes in position.  However, with orthostatic BP measures, no change in BP to indicate this is contributing.  Reassessed R and L DH today, with pt having nystagmus present both sides; L is definite upbeating rotational and lasts >30 seconds.  R is definite rotational nystagmus, more intense, with pt visibly and audibly reports increased spinning symptoms.  Treated for R posterior canal BPPV today, with pt noting no spinning, only the eye jumping (baseline nystagmus) at end of session.  He reports overall improved steadiness.  Discussed possibility of trying habituation exercises such as Brandt-Daroff, but daughter doesn't feel he would be able to do this correctly and safely at home by himself.  Will continue to assess positional testing and progress balance, habituation exercises.  OBJECTIVE IMPAIRMENTS: Abnormal gait, decreased activity tolerance, decreased balance, decreased endurance, difficulty walking, decreased ROM, decreased strength, dizziness, impaired  flexibility, and postural dysfunction.   ACTIVITY LIMITATIONS: carrying, lifting, bending, sitting, standing, squatting, stairs, transfers, bathing, toileting, dressing, reach over head, hygiene/grooming, and locomotion level  PARTICIPATION LIMITATIONS: meal prep, cleaning, laundry, shopping, community activity, and church  PERSONAL FACTORS: Age, Past/current experiences, Time since onset of injury/illness/exacerbation, Transportation, and 3+ comorbidities: Angina, CAD, cerebral aneurysm, CHF, HTN, HOH, stroke 2017, HLD, a-fib, NSVT, prostate CA s/p surgery and radiation, VBI  are also affecting patient's functional outcome.   REHAB POTENTIAL: Good  CLINICAL DECISION MAKING: Evolving/moderate complexity  EVALUATION COMPLEXITY: Moderate   PLAN:  PT FREQUENCY: 1-2x/week  PT DURATION: 6 weeks  PLANNED INTERVENTIONS: 97164- PT Re-evaluation, 97110-Therapeutic exercises, 97530- Therapeutic activity, 97112- Neuromuscular re-education, 97535- Self Care, 16109- Manual therapy, [id number]- Gait training, [id number]- Canalith repositioning, 97014- Electrical stimulation (unattended), Patient/Family education, Balance training, Stair training, Taping, Dry Needling, Vestibular training, Cryotherapy, and Moist heat  PLAN FOR NEXT SESSION: Reassess posterior canal BPPV and treat as appropriate. Continue to work with [phone] with cues for slowed pace and turning practice; progress balance and BLE strengthening activities    Lonia Blood, PT 03/08/23 4:36 PM Phone: [phone] Fax: [phone]  [facility] at [facility] [address] [place], [place] [address] Phone # [phone] Fax # [phone]

## 2023-03-09 DIAGNOSIS — H524 Presbyopia: Secondary | ICD-10-CM | POA: Diagnosis not present

## 2023-03-11 ENCOUNTER — Other Ambulatory Visit (HOSPITAL_COMMUNITY): Payer: Self-pay

## 2023-03-20 NOTE — Therapy (Signed)
OUTPATIENT PHYSICAL THERAPY VESTIBULAR NOTE     Patient Name: Richard Reed MRN: [id number] DOB:Feb 21, 1926, 89 y.o., male Today's Date: 03/21/2023  END OF SESSION:  PT End of Session - 03/21/23 1537     Visit Number 5    Number of Visits 13    Date for PT Re-Evaluation 04/04/23    Authorization Type BCBS Medicare    PT Start Time 1402    PT Stop Time 1452    PT Time Calculation (min) 50 min    Activity Tolerance Patient tolerated treatment well    Behavior During Therapy WFL for tasks assessed/performed                 Past Medical History:  Diagnosis Date   Angina    Aortic valve insufficiency, moderate    a. 02/2016 Echo: mild AS, mild to mod AI.   Arthritis    Arthritis pain    CAD (coronary artery disease)    a. 09/2010 Cath: RCA 40-60 ost, otw nl cors;  b. 03/2016 Lexiscan MV: EF 46%, no ischemia, low risk.   Cancer [facility])    Carotid disease, bilateral (HCC)    a. 02/2016 Carotid U/S: 1-39% bilat ICA plaquing.   Cerebral aneurysm    a. 02/2016 MRA: fusiform bilobed aneurysm @ R MCA bifurcation.   Cerebrovascular disease    a. 02/2016 MRA: long segment narrowing of basilar artery, moderate bilat posterior inferior cerebellar artery dzs.   Chronic combined systolic and diastolic CHF (congestive heart failure) (HCC)    a. 02/2016 Admitted to hospital in [place] w/ CHF;  b. 02/2016 Echo ([facility]): EF 45-50%, diff HK, gr1DD, mild AS, mild to mod AI, mildly dil Ao root, PASP .   CKD (chronic kidney disease), stage III (HCC)    Dysrhythmia    Essential hypertension    Hard of hearing    Hemiparesis and alteration of sensations as late effects of stroke (HCC) 04/29/2015   Hiatal hernia    History of kidney stones    HOH (hard of hearing)    Hyperlipidemia    Kidney stones    Moderate aortic insufficiency    NICM (nonischemic cardiomyopathy) (HCC)    a. 02/2016 Echo: EF 45-50%, diff HK;  b. 03/2016 Lexiscan MV: No ischemia, EF 46%.   NSVT (nonsustained  ventricular tachycardia) (HCC)    a. 03/2016 noted on event monitor.   PAF (paroxysmal atrial fibrillation) (HCC)    a. 2012 - noted in setting of E coli bacteremia and septic shock.   Pneumonia 03/23/2011   "first time"   Prostate cancer (HCC)    Sepsis due to Escherichia coli (HCC) 03/24/11   Syncope    a. 02/2016 presumed to be 2/2 hypotension/dehydration and basilar artery dzs noted on MRA.   TIA (transient ischemic attack)    Vertebrobasilar insufficiency 04/28/2016   Visual disturbance 03/19/2014   Past Surgical History:  Procedure Laterality Date   CARDIAC CATHETERIZATION  09/2010   non obstructive CAD, 40-60% ostial RCA stenosis without dampening    CATARACT EXTRACTION Bilateral    cerebral angiography     CHOLECYSTECTOMY  04/15/2011   Procedure: LAPAROSCOPIC CHOLECYSTECTOMY WITH INTRAOPERATIVE CHOLANGIOGRAM;  Surgeon: Jetty Duhamel, MD;  Location: [facility] OR;  Service: General;  Laterality: N/A;   CYSTOSCOPY WITH STENT PLACEMENT Bilateral 04/07/2022   Procedure: CYSTOSCOPY WITH BILATERAL STENT PLACEMENT;  Surgeon: Jerilee Field, MD;  Location: [facility] ORS;  Service: Urology;  Laterality: Bilateral;  30 MINS FRO CASE  CYSTOSCOPY/URETEROSCOPY/HOLMIUM LASER/STENT PLACEMENT Bilateral 05/03/2022   Procedure: CYSTOSCOPY BILATERAL URETEROSCOPY/HOLMIUM LASER/STENT PLACEMENT;  Surgeon: Jerilee Field, MD;  Location: [facility]S;  Service: Urology;  Laterality: Bilateral;  2 HRS FOR CASE   EXTRACORPOREAL SHOCK WAVE LITHOTRIPSY Left 12/12/2016   Procedure: LEFT EXTRACORPOREAL SHOCK WAVE LITHOTRIPSY (ESWL);  Surgeon: Crist Fat, MD;  Location: [facility];  Service: Urology;  Laterality: Left;   IR ANGIO INTRA EXTRACRAN SEL COM CAROTID INNOMINATE BILAT MOD SED  03/21/2019   IR ANGIO VERTEBRAL SEL SUBCLAVIAN INNOMINATE BILAT MOD SED  03/21/2019   IR ANGIOGRAM SELECTIVE EACH ADDITIONAL VESSEL  04/20/2022   IR ANGIOGRAM SELECTIVE EACH ADDITIONAL VESSEL  04/20/2022   IR ANGIOGRAM VISCERAL SELECTIVE   04/20/2022   IR EMBO ART  VEN HEMORR LYMPH EXTRAV  INC GUIDE ROADMAPPING  04/20/2022   IR GENERIC HISTORICAL  03/17/2016   IR ANGIO VERTEBRAL SEL SUBCLAVIAN INNOMINATE BILAT MOD SED 03/17/2016 Julieanne Cotton, MD [facility]   IR GENERIC HISTORICAL  03/17/2016   IR ANGIO INTRA EXTRACRAN SEL COM CAROTID INNOMINATE BILAT MOD SED 03/17/2016 Julieanne Cotton, MD [facility]   IR US GUIDE VASC ACCESS RIGHT  04/20/2022   PROSTATE SURGERY     approx 10 years ago, seed implant radiation   TONSILLECTOMY     Patient Active Problem List   Diagnosis Date Noted   GI bleed due to NSAIDs 04/20/2022   Chronic anticoagulation 06/09/2020   History of stroke 06/09/2020   Left ventricular dysfunction 09/10/2019   Rectal bleed 12/01/2018   Asymptomatic PVCs 05/22/2017   Vertebrobasilar insufficiency 04/28/2016   Coronary artery disease due to lipid rich plaque 04/26/2016   Calcified pleural plaque due to asbestos exposure 03/31/2016   Syncope 02/29/2016   LOC (loss of consciousness) (HCC) 02/28/2016   UTI (urinary tract infection) 02/28/2016   Hemiparesis and alteration of sensations as late effects of stroke (HCC) 04/29/2015   TIA (transient ischemic attack) 12/02/2014   Visual disturbance 03/19/2014   Diplopia 03/19/2014   Essential hypertension 04/30/2013   Dyslipidemia 04/30/2013   PAF (paroxysmal atrial fibrillation) (HCC) 10/29/2012   Aortic valve insufficiency, moderate 10/29/2012   Heme positive stool 07/17/2012   Biliary colic 04/13/2011   Prostate CA (HCC) 04/13/2011   Thrombocytopenia (HCC) 03/24/2011   Encephalopathy 03/24/2011   Metabolic acidosis 03/24/2011   Acute on chronic renal failure (HCC) 03/23/2011   Hypotension 03/23/2011   Hard of hearing 03/23/2011   Chronic renal insufficiency, stage III (moderate) (HCC) 03/23/2011    PCP: Soundra Pilon, FNP  REFERRING PROVIDER: Ronney Asters, NP  REFERRING DIAG: R42 (ICD-10-CM) - Dizziness  THERAPY DIAG:  Dizziness and  giddiness  Unsteadiness on feet  Other abnormalities of gait and mobility  ONSET DATE: "a long time'  Rationale for Evaluation and Treatment: Rehabilitation  SUBJECTIVE:   SUBJECTIVE STATEMENT: Daughter reports that the pt was feeling better after last session until recently, when he started staggering more and pt also reports return of dizziness when rolling onto L side. Got new glasses an hour ago.   Pt accompanied by: family member daughter   PERTINENT HISTORY: Angina, CAD, cerebral aneurysm, CHF, HTN, HOH, stroke 2017, HLD, a-fib, NSVT, prostate CA s/p surgery and radiation, VBI Per daughter- reports pt has a hx of "downbeating nystagmus" diagnosed by an eye Dr. Without known cause. Reports this has been ongoing for years   PAIN:  Are you having pain? No  PRECAUTIONS: Fall and Other: known VBI ;  very HOH ( better in R)  RED FLAGS: None   WEIGHT BEARING RESTRICTIONS: No  FALLS: Has patient fallen in last 6 months? Yes. Number of falls 1  LIVING ENVIRONMENT: Lives with: lives with their spouse  Lives in: House/apartment (independent living, however daughter helps with shopping and driving; wife has dementia) Stairs:  none Has following equipment at home: Single point cane, Environmental consultant - 2 wheeled, Environmental consultant - 4 wheeled, and Grab bars  PLOF: Independent with basic ADLs  PATIENT GOALS: improve balance   OBJECTIVE:     TODAY'S TREATMENT: 03/21/23 Activity Comments  R sidelying test x2 R upbeating torsional which changed to downbeating; reports "floating" upon sitting up and lying down (reports this side is worse)  L sidelying test x2 L upbeating torsional nystagmus persisting, downbeating upon sitting up (reversal)  R ganz maneuver  Tolerated well; cervical ROM limiting positions somewhat   R sidelying test  R upbeating torsional which changed to downbeating  R epley  C/o dizziness in position 3 and demonstrsted horizontal nystagmus  R roll  R upbeating torsional  persisting   L roll Geotropic nystagmus   L BBQ roll  Tolerated well        HOME EXERCISE PROGRAM Last updated: 03/21/23 Access Code: [email or web address] URL: [email or web address] Date: 03/21/2023 Prepared by: [facility] - Outpatient  Rehab - [facility]  Program Notes perform activities with daughter supervising   Exercises - Seated Long Arc Quad  - 1 x daily - 5 x weekly - 2 sets - 10 reps - Standing March with Counter Support  - 1 x daily - 5 x weekly - 2 sets - 10 reps - Mini Squat with Counter Support  - 1 x daily - 5 x weekly - 2 sets - 10 reps - Supine to Right Sidelying Vestibular Habituation  - 1 x daily - 5 x weekly - 2 sets - 3-5 reps - Supine to Left Sidelying Vestibular Habituation  - 1 x daily - 5 x weekly - 2 sets - 3-5 reps - Brandt-Daroff Vestibular Exercise  - 1 x daily - 5 x weekly - 2 sets - 3-5 reps   PATIENT EDUCATION: Education details: edu on habituation HEP for improved movement tolerance- to be performed with daughter only  Person educated: Patient and Child(ren) Education method: Explanation, Demonstration, Tactile cues, Verbal cues, and Handouts Education comprehension: verbalized understanding and returned demonstration   ------------------------------------------------------------------------- VESTIBULAR ASSESSMENT   GENERAL OBSERVATION: Pleasant and in no acute distress    SYMPTOM BEHAVIOR:   Subjective history: Reports sometimes lightheaded feeling and spinning feeling sometimes when I get up first thing in the morning.  Stagger when I first stand up in the morning.    Non-Vestibular symptoms: changes in vision and reports weaker distance vision (to see eye MD next week)   Type of dizziness: Imbalance (Disequilibrium), Spinning/Vertigo, and Lightheadedness/Faint   Frequency: anytime I get up   Duration: seconds   Aggravating factors: Induced by position change: rolling to the right and rolling to the left and Induced by motion: bending  down to the ground, turning body quickly, and turning head quickly   Relieving factors: head stationary and slow movements   Progression of symptoms: unchanged   OCULOMOTOR EXAM:   Ocular Alignment: abnormal;  downbeating nystagmus R eye; extraocular movements R eye with smooth pursuits   Ocular ROM: No Limitations   Spontaneous Nystagmus:  present   Gaze-Induced Nystagmus:  present   Smooth Pursuits: intact   Saccades: extra eye movements and reports 6/10 dizziness  Convergence/Divergence: NT cm      VESTIBULAR - OCULAR REFLEX:    Slow VOR: Comment: rates some dizziness, lightheaded and returns to baseline horizontal; vertical difficulty maintaining target   VOR Cancellation: Normal   Head-Impulse Test: HIT Right: negative HIT Left: positive   Dynamic Visual Acuity:  NT    M-CTSIB  Condition 1: Firm Surface, EO 30 Sec, Mild Sway  Condition 2: Firm Surface, EC 30 Sec, Moderate Sway  Condition 3: Foam Surface, EO 30 Sec, Moderate Sway  Condition 4: Foam Surface, EC 2-7 Sec, Severe Sway     POSITIONAL TESTING:  Right Roll Test: nystagmus (baseline); Duration: none Left Roll Test: no symptoms; Duration: none Right Dix-Hallpike: nystagmus almost appeared L rotatory upwards; Duration:>60 sec Left Dix-Hallpike: L upbeating rotatory nystagmus; Duration: 15 sec    Performed L Epley x 1, with pt tolerating well.  Pt does c/o mild dizziness in position 3 and upon return to sit.  PATIENT EDUCATION: Education details: vestibular assessment findings, positional vertigo testing and treatment Person educated: Patient and Child(ren) Education method: Explanation Education comprehension: verbalized understanding     ------------------------------------------------------------ Note: Objective measures below were completed at Evaluation unless otherwise noted.  DIAGNOSTIC FINDINGS: none recent   COGNITION: Overall cognitive status: Difficulty to assess due to:  HOH   SENSATION: Reports occasional N/T in UEs and LEs    POSTURE:  rounded shoulders, forward head, increased thoracic kyphosis, and flexed trunk   PALPATION: no TTP over R knee  ROM: 8 deg with knee compensating B with ankle DF AROM  LOWER EXTREMITY MMT:   MMT (in sitting) Right eval Left eval  Hip flexion 4- 4  Hip abduction 4 4  Hip adduction 4+ 4+  Hip internal rotation    Hip external rotation    Knee flexion 4 4+  Knee extension *gave way d/t knee pain  4+  Ankle dorsiflexion 4 4  Ankle plantarflexion 4 4  Ankle inversion    Ankle eversion    (Blank rows = not tested)   GAIT: Gait pattern: slightly flexed over walker, short step length Assistive device utilized: Walker - 2 wheeled Level of assistance: SBA  FUNCTIONAL TESTS:  5 times sit to stand: 18.48 sec pushing off armrests Timed up and go (TUG): 26.09 sec Berg Balance Scale: 30/56     PATIENT SURVEYS:  FOTO pt denied dizziness until end of appointment    VESTIBULAR TREATMENT:                                                                                                   DATE: 02/21/23  PATIENT EDUCATION: Education details: prognosis, POC, HEP Person educated: Patient and Child(ren) Education method: Explanation, Demonstration, Tactile cues, Verbal cues, and Handouts Education comprehension: verbalized understanding and returned demonstration  HOME EXERCISE PROGRAM: Access Code: [email or web address] URL: [email or web address] Date: 02/21/2023 Prepared by: [facility] - Outpatient  Rehab - [facility]  Exercises - Seated Long Arc Quad  - 1 x daily - 5 x weekly - 2 sets - 10 reps - Standing March with Counter Support  - 1 x  daily - 5 x weekly - 2 sets - 10 reps - Mini Squat with Counter Support  - 1 x daily - 5 x weekly - 2 sets - 10 reps   GOALS: Goals reviewed with patient? Yes  SHORT TERM GOALS: Target date: 03/14/2023  Patient to be independent with initial HEP. Baseline:  HEP initiated Goal status: IN PROGRESS    LONG TERM GOALS: Target date: 04/04/2023  Patient to be independent with advanced HEP. Baseline: Not yet initiated  Goal status: IN PROGRESS  Patient will report 0/10 dizziness with bed mobility.  Baseline: NT Goal status: IN PROGRESS  Patient to demonstrate TUG with LRAD in <15 sec in order to reduce risk of falls.  Baseline: Timed up and go (TUG): 26.09 sec Goal status: IN PROGRESS  Patient to score at least 40/56 on Berg in order to decrease risk of falls. Baseline: 30/56 Goal status: IN PROGRESS  Patient to demonstrate 5xSTS in <15 sec with minimal use of UEs for improved transfer efficiency.  Baseline: 5 times sit to stand: 18.48 sec pushing off armrests Goal status: IN PROGRESS    ASSESSMENT:  CLINICAL IMPRESSION: Patient arrived to session with daughter's report of improvement in symptoms after last session, which then returned when rolling to L. Positional testing revealed possible B posterior canal BPPV. Treated with R Ganz maneuver x1, R epley x 1, and then L BBQ roll as patient subsequently demonstrated geotropic nystagmus with L roll after Epley. Patient tolerated duration of session very well and without complaints upon leaving with daughter. Both reported understanding of habituation HEP which is to be performed with daughter's assist.   OBJECTIVE IMPAIRMENTS: Abnormal gait, decreased activity tolerance, decreased balance, decreased endurance, difficulty walking, decreased ROM, decreased strength, dizziness, impaired flexibility, and postural dysfunction.   ACTIVITY LIMITATIONS: carrying, lifting, bending, sitting, standing, squatting, stairs, transfers, bathing, toileting, dressing, reach over head, hygiene/grooming, and locomotion level  PARTICIPATION LIMITATIONS: meal prep, cleaning, laundry, shopping, community activity, and church  PERSONAL FACTORS: Age, Past/current experiences, Time since onset of  injury/illness/exacerbation, Transportation, and 3+ comorbidities: Angina, CAD, cerebral aneurysm, CHF, HTN, HOH, stroke 2017, HLD, a-fib, NSVT, prostate CA s/p surgery and radiation, VBI  are also affecting patient's functional outcome.   REHAB POTENTIAL: Good  CLINICAL DECISION MAKING: Evolving/moderate complexity  EVALUATION COMPLEXITY: Moderate   PLAN:  PT FREQUENCY: 1-2x/week  PT DURATION: 6 weeks  PLANNED INTERVENTIONS: 97164- PT Re-evaluation, 97110-Therapeutic exercises, 97530- Therapeutic activity, 97112- Neuromuscular re-education, 97535- Self Care, 73220- Manual therapy, [id number]- Gait training, [phone]- Canalith repositioning, 97014- Electrical stimulation (unattended), Patient/Family education, Balance training, Stair training, Taping, Dry Needling, Vestibular training, Cryotherapy, and Moist heat  PLAN FOR NEXT SESSION: Reassess posterior canal BPPV and treat as appropriate. Continue to work with [phone] with cues for slowed pace and turning practice; progress balance and BLE strengthening activities    Anette Guarneri, PT, DPT 03/21/23 3:41 PM  [facility] at [facility] [address] [place], [address] [place], [place] [address] Phone # [phone] Fax # [phone]

## 2023-03-21 ENCOUNTER — Encounter: Payer: Self-pay | Admitting: Physical Therapy

## 2023-03-21 ENCOUNTER — Ambulatory Visit: Payer: Medicare Other | Attending: General Practice | Admitting: Physical Therapy

## 2023-03-21 DIAGNOSIS — M6281 Muscle weakness (generalized): Secondary | ICD-10-CM | POA: Insufficient documentation

## 2023-03-21 DIAGNOSIS — R2689 Other abnormalities of gait and mobility: Secondary | ICD-10-CM | POA: Diagnosis not present

## 2023-03-21 DIAGNOSIS — R2681 Unsteadiness on feet: Secondary | ICD-10-CM | POA: Insufficient documentation

## 2023-03-21 DIAGNOSIS — R42 Dizziness and giddiness: Secondary | ICD-10-CM | POA: Diagnosis not present

## 2023-03-23 NOTE — Therapy (Signed)
OUTPATIENT PHYSICAL THERAPY VESTIBULAR NOTE     Patient Name: Richard Reed MRN: [id number] DOB:1926-01-04, 89 y.o., male Today's Date: 03/27/2023  END OF SESSION:  PT End of Session - 03/27/23 1231     Visit Number 6    Number of Visits 13    Date for PT Re-Evaluation 04/04/23    Authorization Type BCBS Medicare    PT Start Time 1150    PT Stop Time 1230    PT Time Calculation (min) 40 min    Activity Tolerance Patient tolerated treatment well    Behavior During Therapy WFL for tasks assessed/performed                  Past Medical History:  Diagnosis Date   Angina    Aortic valve insufficiency, moderate    a. 02/2016 Echo: mild AS, mild to mod AI.   Arthritis    Arthritis pain    CAD (coronary artery disease)    a. 09/2010 Cath: RCA 40-60 ost, otw nl cors;  b. 03/2016 Lexiscan MV: EF 46%, no ischemia, low risk.   Cancer [facility])    Carotid disease, bilateral (HCC)    a. 02/2016 Carotid U/S: 1-39% bilat ICA plaquing.   Cerebral aneurysm    a. 02/2016 MRA: fusiform bilobed aneurysm @ R MCA bifurcation.   Cerebrovascular disease    a. 02/2016 MRA: long segment narrowing of basilar artery, moderate bilat posterior inferior cerebellar artery dzs.   Chronic combined systolic and diastolic CHF (congestive heart failure) (HCC)    a. 02/2016 Admitted to hospital in [place] w/ CHF;  b. 02/2016 Echo ([facility]): EF 45-50%, diff HK, gr1DD, mild AS, mild to mod AI, mildly dil Ao root, PASP .   CKD (chronic kidney disease), stage III (HCC)    Dysrhythmia    Essential hypertension    Hard of hearing    Hemiparesis and alteration of sensations as late effects of stroke (HCC) 04/29/2015   Hiatal hernia    History of kidney stones    HOH (hard of hearing)    Hyperlipidemia    Kidney stones    Moderate aortic insufficiency    NICM (nonischemic cardiomyopathy) (HCC)    a. 02/2016 Echo: EF 45-50%, diff HK;  b. 03/2016 Lexiscan MV: No ischemia, EF 46%.   NSVT (nonsustained  ventricular tachycardia) (HCC)    a. 03/2016 noted on event monitor.   PAF (paroxysmal atrial fibrillation) (HCC)    a. 2012 - noted in setting of E coli bacteremia and septic shock.   Pneumonia 03/23/2011   "first time"   Prostate cancer (HCC)    Sepsis due to Escherichia coli (HCC) 03/24/11   Syncope    a. 02/2016 presumed to be 2/2 hypotension/dehydration and basilar artery dzs noted on MRA.   TIA (transient ischemic attack)    Vertebrobasilar insufficiency 04/28/2016   Visual disturbance 03/19/2014   Past Surgical History:  Procedure Laterality Date   CARDIAC CATHETERIZATION  09/2010   non obstructive CAD, 40-60% ostial RCA stenosis without dampening    CATARACT EXTRACTION Bilateral    cerebral angiography     CHOLECYSTECTOMY  04/15/2011   Procedure: LAPAROSCOPIC CHOLECYSTECTOMY WITH INTRAOPERATIVE CHOLANGIOGRAM;  Surgeon: Jetty Duhamel, MD;  Location: [facility] OR;  Service: General;  Laterality: N/A;   CYSTOSCOPY WITH STENT PLACEMENT Bilateral 04/07/2022   Procedure: CYSTOSCOPY WITH BILATERAL STENT PLACEMENT;  Surgeon: Jerilee Field, MD;  Location: [facility] ORS;  Service: Urology;  Laterality: Bilateral;  30 MINS FRO CASE  CYSTOSCOPY/URETEROSCOPY/HOLMIUM LASER/STENT PLACEMENT Bilateral 05/03/2022   Procedure: CYSTOSCOPY BILATERAL URETEROSCOPY/HOLMIUM LASER/STENT PLACEMENT;  Surgeon: Jerilee Field, MD;  Location: [facility]S;  Service: Urology;  Laterality: Bilateral;  2 HRS FOR CASE   EXTRACORPOREAL SHOCK WAVE LITHOTRIPSY Left 12/12/2016   Procedure: LEFT EXTRACORPOREAL SHOCK WAVE LITHOTRIPSY (ESWL);  Surgeon: Crist Fat, MD;  Location: [facility];  Service: Urology;  Laterality: Left;   IR ANGIO INTRA EXTRACRAN SEL COM CAROTID INNOMINATE BILAT MOD SED  03/21/2019   IR ANGIO VERTEBRAL SEL SUBCLAVIAN INNOMINATE BILAT MOD SED  03/21/2019   IR ANGIOGRAM SELECTIVE EACH ADDITIONAL VESSEL  04/20/2022   IR ANGIOGRAM SELECTIVE EACH ADDITIONAL VESSEL  04/20/2022   IR ANGIOGRAM VISCERAL SELECTIVE   04/20/2022   IR EMBO ART  VEN HEMORR LYMPH EXTRAV  INC GUIDE ROADMAPPING  04/20/2022   IR GENERIC HISTORICAL  03/17/2016   IR ANGIO VERTEBRAL SEL SUBCLAVIAN INNOMINATE BILAT MOD SED 03/17/2016 Julieanne Cotton, MD [facility]   IR GENERIC HISTORICAL  03/17/2016   IR ANGIO INTRA EXTRACRAN SEL COM CAROTID INNOMINATE BILAT MOD SED 03/17/2016 Julieanne Cotton, MD [facility]   IR US GUIDE VASC ACCESS RIGHT  04/20/2022   PROSTATE SURGERY     approx 10 years ago, seed implant radiation   TONSILLECTOMY     Patient Active Problem List   Diagnosis Date Noted   GI bleed due to NSAIDs 04/20/2022   Chronic anticoagulation 06/09/2020   History of stroke 06/09/2020   Left ventricular dysfunction 09/10/2019   Rectal bleed 12/01/2018   Asymptomatic PVCs 05/22/2017   Vertebrobasilar insufficiency 04/28/2016   Coronary artery disease due to lipid rich plaque 04/26/2016   Calcified pleural plaque due to asbestos exposure 03/31/2016   Syncope 02/29/2016   LOC (loss of consciousness) (HCC) 02/28/2016   UTI (urinary tract infection) 02/28/2016   Hemiparesis and alteration of sensations as late effects of stroke (HCC) 04/29/2015   TIA (transient ischemic attack) 12/02/2014   Visual disturbance 03/19/2014   Diplopia 03/19/2014   Essential hypertension 04/30/2013   Dyslipidemia 04/30/2013   PAF (paroxysmal atrial fibrillation) (HCC) 10/29/2012   Aortic valve insufficiency, moderate 10/29/2012   Heme positive stool 07/17/2012   Biliary colic 04/13/2011   Prostate CA (HCC) 04/13/2011   Thrombocytopenia (HCC) 03/24/2011   Encephalopathy 03/24/2011   Metabolic acidosis 03/24/2011   Acute on chronic renal failure (HCC) 03/23/2011   Hypotension 03/23/2011   Hard of hearing 03/23/2011   Chronic renal insufficiency, stage III (moderate) (HCC) 03/23/2011    PCP: Soundra Pilon, FNP  REFERRING PROVIDER: Ronney Asters, NP  REFERRING DIAG: R42 (ICD-10-CM) - Dizziness  THERAPY DIAG:  Dizziness and  giddiness  Unsteadiness on feet  ONSET DATE: "a long time'  Rationale for Evaluation and Treatment: Rehabilitation  SUBJECTIVE:   SUBJECTIVE STATEMENT: Patient reports that he did his exercises with his daughter and he did not have any dizziness to either size. However did have some dizziness and staggering this morning.   Pt accompanied by: family member daughter   PERTINENT HISTORY: Angina, CAD, cerebral aneurysm, CHF, HTN, HOH, stroke 2017, HLD, a-fib, NSVT, prostate CA s/p surgery and radiation, VBI Per daughter- reports pt has a hx of "downbeating nystagmus" diagnosed by an eye Dr. Without known cause. Reports this has been ongoing for years   PAIN:  Are you having pain? No  PRECAUTIONS: Fall and Other: known VBI ;  very HOH ( better in R)  RED FLAGS: None   WEIGHT BEARING RESTRICTIONS: No  FALLS: Has patient  fallen in last 6 months? Yes. Number of falls 1  LIVING ENVIRONMENT: Lives with: lives with their spouse  Lives in: House/apartment (independent living, however daughter helps with shopping and driving; wife has dementia) Stairs:  none Has following equipment at home: Single point cane, Environmental consultant - 2 wheeled, Environmental consultant - 4 wheeled, and Grab bars  PLOF: Independent with basic ADLs  PATIENT GOALS: improve balance   OBJECTIVE:    TODAY'S TREATMENT: 03/27/23 Activity Comments  L roll L upbeating torsional ~20 sec; upon rolling back into supine pt with upbeating nystagmus lasting ~ 1 min  R roll Downbeating nystagmus lasting ~ 1 min; also downbeating upon sitting up on EOM  L DH Few beats L upbeating torsional nystagmus, followed by upbeating lasting ~45 sec  L epley  Upon position 2; pt with R upbeating torsional nystagmus persisting and also upon sitting up which resolved   L DH Few beats L upbeating torsional nystagmus, followed by upbeating lasting ~10 sec  R DH R upbeating torsional nystagmus persisting  R semont  Unable to elicit nystagmus in starting position;  downbeating in position 2 and upon siting up   R semont Upbeating R torsional persisting in position 1;  downbeating in position 2 and upon siting up     PATIENT EDUCATION: Education details: edu on central vs. Peripheral causes of dizziness and possible need to f/u with PCP for further workup d/t patient's limited response to exercises, changing nystagmus pattern, central rather than peripheral nystagmus pattern  Person educated: Patient and Child(ren) Education method: Explanation and Demonstration Education comprehension: verbalized understanding   HOME EXERCISE PROGRAM Last updated: 03/21/23 Access Code: [email or web address] URL: [email or web address] Date: 03/21/2023 Prepared by: [facility] - Outpatient  Rehab - [facility]  Program Notes perform activities with daughter supervising   Exercises - Seated Long Arc Quad  - 1 x daily - 5 x weekly - 2 sets - 10 reps - Standing March with Counter Support  - 1 x daily - 5 x weekly - 2 sets - 10 reps - Mini Squat with Counter Support  - 1 x daily - 5 x weekly - 2 sets - 10 reps - Supine to Right Sidelying Vestibular Habituation  - 1 x daily - 5 x weekly - 2 sets - 3-5 reps - Supine to Left Sidelying Vestibular Habituation  - 1 x daily - 5 x weekly - 2 sets - 3-5 reps - Brandt-Daroff Vestibular Exercise  - 1 x daily - 5 x weekly - 2 sets - 3-5 reps     ------------------------------------------------------------------------- VESTIBULAR ASSESSMENT   GENERAL OBSERVATION: Pleasant and in no acute distress    SYMPTOM BEHAVIOR:   Subjective history: Reports sometimes lightheaded feeling and spinning feeling sometimes when I get up first thing in the morning.  Stagger when I first stand up in the morning.    Non-Vestibular symptoms: changes in vision and reports weaker distance vision (to see eye MD next week)   Type of dizziness: Imbalance (Disequilibrium), Spinning/Vertigo, and Lightheadedness/Faint   Frequency: anytime I get  up   Duration: seconds   Aggravating factors: Induced by position change: rolling to the right and rolling to the left and Induced by motion: bending down to the ground, turning body quickly, and turning head quickly   Relieving factors: head stationary and slow movements   Progression of symptoms: unchanged   OCULOMOTOR EXAM:   Ocular Alignment: abnormal;  downbeating nystagmus R eye; extraocular movements R eye with smooth pursuits   Ocular  ROM: No Limitations   Spontaneous Nystagmus:  present   Gaze-Induced Nystagmus:  present   Smooth Pursuits: intact   Saccades: extra eye movements and reports 6/10 dizziness   Convergence/Divergence: NT cm      VESTIBULAR - OCULAR REFLEX:    Slow VOR: Comment: rates some dizziness, lightheaded and returns to baseline horizontal; vertical difficulty maintaining target   VOR Cancellation: Normal   Head-Impulse Test: HIT Right: negative HIT Left: positive   Dynamic Visual Acuity:  NT    M-CTSIB  Condition 1: Firm Surface, EO 30 Sec, Mild Sway  Condition 2: Firm Surface, EC 30 Sec, Moderate Sway  Condition 3: Foam Surface, EO 30 Sec, Moderate Sway  Condition 4: Foam Surface, EC 2-7 Sec, Severe Sway     POSITIONAL TESTING:  Right Roll Test: nystagmus (baseline); Duration: none Left Roll Test: no symptoms; Duration: none Right Dix-Hallpike: nystagmus almost appeared L rotatory upwards; Duration:>60 sec Left Dix-Hallpike: L upbeating rotatory nystagmus; Duration: 15 sec    Performed L Epley x 1, with pt tolerating well.  Pt does c/o mild dizziness in position 3 and upon return to sit.  PATIENT EDUCATION: Education details: vestibular assessment findings, positional vertigo testing and treatment Person educated: Patient and Child(ren) Education method: Explanation Education comprehension: verbalized understanding     ------------------------------------------------------------ Note: Objective measures below were completed at Evaluation  unless otherwise noted.  DIAGNOSTIC FINDINGS: none recent   COGNITION: Overall cognitive status: Difficulty to assess due to: HOH   SENSATION: Reports occasional N/T in UEs and LEs    POSTURE:  rounded shoulders, forward head, increased thoracic kyphosis, and flexed trunk   PALPATION: no TTP over R knee  ROM: 8 deg with knee compensating B with ankle DF AROM  LOWER EXTREMITY MMT:   MMT (in sitting) Right eval Left eval  Hip flexion 4- 4  Hip abduction 4 4  Hip adduction 4+ 4+  Hip internal rotation    Hip external rotation    Knee flexion 4 4+  Knee extension *gave way d/t knee pain  4+  Ankle dorsiflexion 4 4  Ankle plantarflexion 4 4  Ankle inversion    Ankle eversion    (Blank rows = not tested)   GAIT: Gait pattern: slightly flexed over walker, short step length Assistive device utilized: Walker - 2 wheeled Level of assistance: SBA  FUNCTIONAL TESTS:  5 times sit to stand: 18.48 sec pushing off armrests Timed up and go (TUG): 26.09 sec Berg Balance Scale: 30/56     PATIENT SURVEYS:  FOTO pt denied dizziness until end of appointment    VESTIBULAR TREATMENT:                                                                                                   DATE: 02/21/23  PATIENT EDUCATION: Education details: prognosis, POC, HEP Person educated: Patient and Child(ren) Education method: Explanation, Demonstration, Tactile cues, Verbal cues, and Handouts Education comprehension: verbalized understanding and returned demonstration  HOME EXERCISE PROGRAM: Access Code: [email or web address] URL: [email or web address] Date: 02/21/2023 Prepared by: [facility] - Outpatient  Rehab - [facility]  Exercises - Seated Long Arc Quad  - 1 x daily - 5 x weekly - 2 sets - 10 reps - Standing March with Counter Support  - 1 x daily - 5 x weekly - 2 sets - 10 reps - Mini Squat with Counter Support  - 1 x daily - 5 x weekly - 2 sets - 10 reps   GOALS: Goals  reviewed with patient? Yes  SHORT TERM GOALS: Target date: 03/14/2023  Patient to be independent with initial HEP. Baseline: HEP initiated Goal status: IN PROGRESS    LONG TERM GOALS: Target date: 04/04/2023  Patient to be independent with advanced HEP. Baseline: Not yet initiated  Goal status: IN PROGRESS  Patient will report 0/10 dizziness with bed mobility.  Baseline: NT Goal status: IN PROGRESS  Patient to demonstrate TUG with LRAD in <15 sec in order to reduce risk of falls.  Baseline: Timed up and go (TUG): 26.09 sec Goal status: IN PROGRESS  Patient to score at least 40/56 on Berg in order to decrease risk of falls. Baseline: 30/56 Goal status: IN PROGRESS  Patient to demonstrate 5xSTS in <15 sec with minimal use of UEs for improved transfer efficiency.  Baseline: 5 times sit to stand: 18.48 sec pushing off armrests Goal status: IN PROGRESS    ASSESSMENT:  CLINICAL IMPRESSION: Patient arrived to session with report of improved dizziness after performing HEP provided. Reports return of dizziness and unsteadiness this AM. Positional testing revealed resolved horizontal BPPV; only with possibility of posterior canals affected vs. central positional dizziness. Treated with L Epley and R semont x 2 which he tolerated well. Patient's nystagmus does not necessarily follow a typical pattern for BPPV and combined with limited improvement and frequent change in nystagmus, may need to be medically addressed/worked up. Explained this to patient and daughter who are agreeable.   OBJECTIVE IMPAIRMENTS: Abnormal gait, decreased activity tolerance, decreased balance, decreased endurance, difficulty walking, decreased ROM, decreased strength, dizziness, impaired flexibility, and postural dysfunction.   ACTIVITY LIMITATIONS: carrying, lifting, bending, sitting, standing, squatting, stairs, transfers, bathing, toileting, dressing, reach over head, hygiene/grooming, and locomotion  level  PARTICIPATION LIMITATIONS: meal prep, cleaning, laundry, shopping, community activity, and church  PERSONAL FACTORS: Age, Past/current experiences, Time since onset of injury/illness/exacerbation, Transportation, and 3+ comorbidities: Angina, CAD, cerebral aneurysm, CHF, HTN, HOH, stroke 2017, HLD, a-fib, NSVT, prostate CA s/p surgery and radiation, VBI  are also affecting patient's functional outcome.   REHAB POTENTIAL: Good  CLINICAL DECISION MAKING: Evolving/moderate complexity  EVALUATION COMPLEXITY: Moderate   PLAN:  PT FREQUENCY: 1-2x/week  PT DURATION: 6 weeks  PLANNED INTERVENTIONS: 97164- PT Re-evaluation, 97110-Therapeutic exercises, 97530- Therapeutic activity, 97112- Neuromuscular re-education, 97535- Self Care, 10272- Manual therapy, [id number]- Gait training, [phone]- Canalith repositioning, 97014- Electrical stimulation (unattended), Patient/Family education, Balance training, Stair training, Taping, Dry Needling, Vestibular training, Cryotherapy, and Moist heat  PLAN FOR NEXT SESSION: reassessment and possible DC vs. Hold; Reassess posterior canal BPPV and treat as appropriate. Continue to work with [phone] with cues for slowed pace and turning practice; progress balance and BLE strengthening activities    Baldemar Friday, [place], DPT 03/27/23 12:36 PM  [facility] at [facility] [address] [place], [address] [place], [place] [address] Phone # [phone] Fax # [phone]

## 2023-03-27 ENCOUNTER — Ambulatory Visit: Payer: Medicare Other | Admitting: Physical Therapy

## 2023-03-27 ENCOUNTER — Encounter: Payer: Self-pay | Admitting: Physical Therapy

## 2023-03-27 ENCOUNTER — Telehealth: Payer: Self-pay | Admitting: Physical Therapy

## 2023-03-27 DIAGNOSIS — R2681 Unsteadiness on feet: Secondary | ICD-10-CM

## 2023-03-27 DIAGNOSIS — R2689 Other abnormalities of gait and mobility: Secondary | ICD-10-CM | POA: Diagnosis not present

## 2023-03-27 DIAGNOSIS — H5509 Other forms of nystagmus: Secondary | ICD-10-CM

## 2023-03-27 DIAGNOSIS — R42 Dizziness and giddiness: Secondary | ICD-10-CM | POA: Diagnosis not present

## 2023-03-27 DIAGNOSIS — M6281 Muscle weakness (generalized): Secondary | ICD-10-CM | POA: Diagnosis not present

## 2023-03-27 NOTE — Telephone Encounter (Signed)
Referral entered Recall [id number]

## 2023-03-27 NOTE — Addendum Note (Signed)
Addended by: Alyson Ingles on: 03/27/2023 04:55 PM   Modules accepted: Orders

## 2023-03-27 NOTE — Telephone Encounter (Signed)
Hello,  Richard Reed is being seen in OPPT for dizziness  per referral of Edd Fabian, NP. His daughter reports longstanding hx of downbeating nystagmus without known explanation. He is presenting with positional nystagmus that does not align with a specific semicircular canal and does not seem to be responding to canalith repositioning maneuvers. Believe he may benefit from further CNS workup or referral to Neuro. Please advise.  Thanks!  Baldemar Friday, PT, DPT 03/27/23 12:43 PM  [facility] at [facility] [address] [place], [address] [place], [place] [address] Phone # [phone] Fax # [phone]

## 2023-03-27 NOTE — Addendum Note (Signed)
Addended by: Alyson Ingles on: 03/27/2023 01:21 PM   Modules accepted: Orders

## 2023-03-28 ENCOUNTER — Encounter: Payer: Self-pay | Admitting: Neurology

## 2023-03-29 ENCOUNTER — Ambulatory Visit: Payer: Medicare Other

## 2023-03-29 DIAGNOSIS — H57813 Brow ptosis, bilateral: Secondary | ICD-10-CM | POA: Diagnosis not present

## 2023-03-29 DIAGNOSIS — H0231 Blepharochalasis right upper eyelid: Secondary | ICD-10-CM | POA: Diagnosis not present

## 2023-03-29 DIAGNOSIS — H0279 Other degenerative disorders of eyelid and periocular area: Secondary | ICD-10-CM | POA: Diagnosis not present

## 2023-03-29 DIAGNOSIS — H0012 Chalazion right lower eyelid: Secondary | ICD-10-CM | POA: Diagnosis not present

## 2023-03-31 NOTE — Therapy (Signed)
OUTPATIENT PHYSICAL THERAPY VESTIBULAR PROGRESS NOTE     Patient Name: Richard Reed MRN: [id number] DOB:09-29-1925, 89 y.o., male Today's Date: 04/03/2023   Progress Note Reporting Period 02/21/23 to 04/03/23  See note below for Objective Data and Assessment of Progress/Goals.      END OF SESSION:  PT End of Session - 04/03/23 1443     Visit Number 7    Number of Visits 13    Date for PT Re-Evaluation 04/04/23    Authorization Type BCBS Medicare    PT Start Time 1354    PT Stop Time 1444    PT Time Calculation (min) 50 min    Equipment Utilized During Treatment Gait belt    Activity Tolerance Patient tolerated treatment well    Behavior During Therapy WFL for tasks assessed/performed                   Past Medical History:  Diagnosis Date   Angina    Aortic valve insufficiency, moderate    a. 02/2016 Echo: mild AS, mild to mod AI.   Arthritis    Arthritis pain    CAD (coronary artery disease)    a. 09/2010 Cath: RCA 40-60 ost, otw nl cors;  b. 03/2016 Lexiscan MV: EF 46%, no ischemia, low risk.   Cancer [facility])    Carotid disease, bilateral (HCC)    a. 02/2016 Carotid U/S: 1-39% bilat ICA plaquing.   Cerebral aneurysm    a. 02/2016 MRA: fusiform bilobed aneurysm @ R MCA bifurcation.   Cerebrovascular disease    a. 02/2016 MRA: long segment narrowing of basilar artery, moderate bilat posterior inferior cerebellar artery dzs.   Chronic combined systolic and diastolic CHF (congestive heart failure) (HCC)    a. 02/2016 Admitted to hospital in [place] w/ CHF;  b. 02/2016 Echo ([facility]): EF 45-50%, diff HK, gr1DD, mild AS, mild to mod AI, mildly dil Ao root, PASP .   CKD (chronic kidney disease), stage III (HCC)    Dysrhythmia    Essential hypertension    Hard of hearing    Hemiparesis and alteration of sensations as late effects of stroke (HCC) 04/29/2015   Hiatal hernia    History of kidney stones    HOH (hard of hearing)    Hyperlipidemia     Kidney stones    Moderate aortic insufficiency    NICM (nonischemic cardiomyopathy) (HCC)    a. 02/2016 Echo: EF 45-50%, diff HK;  b. 03/2016 Lexiscan MV: No ischemia, EF 46%.   NSVT (nonsustained ventricular tachycardia) (HCC)    a. 03/2016 noted on event monitor.   PAF (paroxysmal atrial fibrillation) (HCC)    a. 2012 - noted in setting of E coli bacteremia and septic shock.   Pneumonia 03/23/2011   "first time"   Prostate cancer (HCC)    Sepsis due to Escherichia coli (HCC) 03/24/11   Syncope    a. 02/2016 presumed to be 2/2 hypotension/dehydration and basilar artery dzs noted on MRA.   TIA (transient ischemic attack)    Vertebrobasilar insufficiency 04/28/2016   Visual disturbance 03/19/2014   Past Surgical History:  Procedure Laterality Date   CARDIAC CATHETERIZATION  09/2010   non obstructive CAD, 40-60% ostial RCA stenosis without dampening    CATARACT EXTRACTION Bilateral    cerebral angiography     CHOLECYSTECTOMY  04/15/2011   Procedure: LAPAROSCOPIC CHOLECYSTECTOMY WITH INTRAOPERATIVE CHOLANGIOGRAM;  Surgeon: Jetty Duhamel, MD;  Location: [facility] OR;  Service: General;  Laterality: N/A;  CYSTOSCOPY WITH STENT PLACEMENT Bilateral 04/07/2022   Procedure: CYSTOSCOPY WITH BILATERAL STENT PLACEMENT;  Surgeon: Jerilee Field, MD;  Location: [facility] ORS;  Service: Urology;  Laterality: Bilateral;  30 MINS FRO CASE   CYSTOSCOPY/URETEROSCOPY/HOLMIUM LASER/STENT PLACEMENT Bilateral 05/03/2022   Procedure: CYSTOSCOPY BILATERAL URETEROSCOPY/HOLMIUM LASER/STENT PLACEMENT;  Surgeon: Jerilee Field, MD;  Location: [facility];  Service: Urology;  Laterality: Bilateral;  2 HRS FOR CASE   EXTRACORPOREAL SHOCK WAVE LITHOTRIPSY Left 12/12/2016   Procedure: LEFT EXTRACORPOREAL SHOCK WAVE LITHOTRIPSY (ESWL);  Surgeon: Crist Fat, MD;  Location: [facility];  Service: Urology;  Laterality: Left;   IR ANGIO INTRA EXTRACRAN SEL COM CAROTID INNOMINATE BILAT MOD SED  03/21/2019   IR ANGIO VERTEBRAL SEL  SUBCLAVIAN INNOMINATE BILAT MOD SED  03/21/2019   IR ANGIOGRAM SELECTIVE EACH ADDITIONAL VESSEL  04/20/2022   IR ANGIOGRAM SELECTIVE EACH ADDITIONAL VESSEL  04/20/2022   IR ANGIOGRAM VISCERAL SELECTIVE  04/20/2022   IR EMBO ART  VEN HEMORR LYMPH EXTRAV  INC GUIDE ROADMAPPING  04/20/2022   IR GENERIC HISTORICAL  03/17/2016   IR ANGIO VERTEBRAL SEL SUBCLAVIAN INNOMINATE BILAT MOD SED 03/17/2016 Julieanne Cotton, MD [facility]   IR GENERIC HISTORICAL  03/17/2016   IR ANGIO INTRA EXTRACRAN SEL COM CAROTID INNOMINATE BILAT MOD SED 03/17/2016 Julieanne Cotton, MD [facility]   IR US GUIDE VASC ACCESS RIGHT  04/20/2022   PROSTATE SURGERY     approx 10 years ago, seed implant radiation   TONSILLECTOMY     Patient Active Problem List   Diagnosis Date Noted   GI bleed due to NSAIDs 04/20/2022   Chronic anticoagulation 06/09/2020   History of stroke 06/09/2020   Left ventricular dysfunction 09/10/2019   Rectal bleed 12/01/2018   Asymptomatic PVCs 05/22/2017   Vertebrobasilar insufficiency 04/28/2016   Coronary artery disease due to lipid rich plaque 04/26/2016   Calcified pleural plaque due to asbestos exposure 03/31/2016   Syncope 02/29/2016   LOC (loss of consciousness) (HCC) 02/28/2016   UTI (urinary tract infection) 02/28/2016   Hemiparesis and alteration of sensations as late effects of stroke (HCC) 04/29/2015   TIA (transient ischemic attack) 12/02/2014   Visual disturbance 03/19/2014   Diplopia 03/19/2014   Essential hypertension 04/30/2013   Dyslipidemia 04/30/2013   PAF (paroxysmal atrial fibrillation) (HCC) 10/29/2012   Aortic valve insufficiency, moderate 10/29/2012   Heme positive stool 07/17/2012   Biliary colic 04/13/2011   Prostate CA (HCC) 04/13/2011   Thrombocytopenia (HCC) 03/24/2011   Encephalopathy 03/24/2011   Metabolic acidosis 03/24/2011   Acute on chronic renal failure (HCC) 03/23/2011   Hypotension 03/23/2011   Hard of hearing 03/23/2011   Chronic renal  insufficiency, stage III (moderate) (HCC) 03/23/2011    PCP: Soundra Pilon, FNP  REFERRING PROVIDER: Ronney Asters, NP  REFERRING DIAG: R42 (ICD-10-CM) - Dizziness  THERAPY DIAG:  Dizziness and giddiness  Unsteadiness on feet  Other abnormalities of gait and mobility  Muscle weakness (generalized)  ONSET DATE: "a long time'  Rationale for Evaluation and Treatment: Rehabilitation  SUBJECTIVE:   SUBJECTIVE STATEMENT: "It's not so much my eyes jumping, it's the staggering." Reports that he is doing his HEP by himself and is tolerating it well. Reports that he told his daughter that he will do them on his own as she has gotten too busy. Reports that when he sits up on the side of the bed in the AM, he is not as dizzy as before.   Pt accompanied by: self   PERTINENT HISTORY:  Angina, CAD, cerebral aneurysm, CHF, HTN, HOH, stroke 2017, HLD, a-fib, NSVT, prostate CA s/p surgery and radiation, VBI Per daughter- reports pt has a hx of "downbeating nystagmus" diagnosed by an eye Dr. Without known cause. Reports this has been ongoing for years   PAIN:  Are you having pain? No  PRECAUTIONS: Fall and Other: known VBI ;  very HOH ( better in R)  RED FLAGS: None   WEIGHT BEARING RESTRICTIONS: No  FALLS: Has patient fallen in last 6 months? Yes. Number of falls 1  LIVING ENVIRONMENT: Lives with: lives with their spouse  Lives in: House/apartment (independent living, however daughter helps with shopping and driving; wife has dementia) Stairs:  none Has following equipment at home: Single point cane, Environmental consultant - 2 wheeled, Environmental consultant - 4 wheeled, and Grab bars  PLOF: Independent with basic ADLs  PATIENT GOALS: improve balance   OBJECTIVE:     TODAY'S TREATMENT: 04/03/23 Activity Comments  Bed mobility Sit>R sidelying, rolling, sidelying>sit Downbeating in R SL, L upbeating torsional lasting ~15 sec upon L roll, downbeating upon sitting up; reports 4/10 dizziness   TUG 16.32  sec With RW  Berg 43/56   5xSTS  17.91 sec not standing fully and requiring walker support; 19.82 sec after prompting to stand fully   Review of BD  R: ~10 sec R upbeating torsional nystagmus  L: ~5 sec L upbeating torsional nystagmus   Pt tolerated well with mild dizziness and no imbalance. Required correction of positioning for brandt daroff as he was performing incorrectly        PATIENT EDUCATION: Education details: update to HEP- printed in larger font and provided notes for max carryover and safety, edu on objective progress, 30 day hold  Person educated: Patient and Child(ren) (wrote note outlining all of this for daughter Clydie Braun) Education method: Explanation, Demonstration, Tactile cues, Verbal cues, and Handouts Education comprehension: verbalized understanding and returned demonstration   HOME EXERCISE PROGRAM Access Code: [email or web address] URL: [email or web address] Date: 04/03/2023 Prepared by: [facility] - Outpatient  Rehab - [facility]  Program Notes stop these activities if you become too dizzy.  Exercises - Seated Long Arc Quad  - 1 x daily - 5 x weekly - 2 sets - 10 reps - Standing March with Counter Support  - 1 x daily - 5 x weekly - 2 sets - 10 reps - Mini Squat with Counter Support  - 1 x daily - 5 x weekly - 2 sets - 10 reps - Supine to Right Sidelying Vestibular Habituation  - 1 x daily - 5 x weekly - 2 sets - 3-5 reps - Supine to Left Sidelying Vestibular Habituation  - 1 x daily - 5 x weekly - 2 sets - 3-5 reps - Brandt-Daroff Vestibular Exercise  - 1 x daily - 5 x weekly - 2 sets - 3-5 reps - Sit to Stand with Counter Support  - 1 x daily - 5 x weekly - 2 sets - 5-10 reps     ------------------------------------------------------------------------- VESTIBULAR ASSESSMENT   GENERAL OBSERVATION: Pleasant and in no acute distress    SYMPTOM BEHAVIOR:   Subjective history: Reports sometimes lightheaded feeling and spinning feeling sometimes  when I get up first thing in the morning.  Stagger when I first stand up in the morning.    Non-Vestibular symptoms: changes in vision and reports weaker distance vision (to see eye MD next week)   Type of dizziness: Imbalance (Disequilibrium), Spinning/Vertigo, and Lightheadedness/Faint   Frequency: anytime I  get up   Duration: seconds   Aggravating factors: Induced by position change: rolling to the right and rolling to the left and Induced by motion: bending down to the ground, turning body quickly, and turning head quickly   Relieving factors: head stationary and slow movements   Progression of symptoms: unchanged   OCULOMOTOR EXAM:   Ocular Alignment: abnormal;  downbeating nystagmus R eye; extraocular movements R eye with smooth pursuits   Ocular ROM: No Limitations   Spontaneous Nystagmus:  present   Gaze-Induced Nystagmus:  present   Smooth Pursuits: intact   Saccades: extra eye movements and reports 6/10 dizziness   Convergence/Divergence: NT cm      VESTIBULAR - OCULAR REFLEX:    Slow VOR: Comment: rates some dizziness, lightheaded and returns to baseline horizontal; vertical difficulty maintaining target   VOR Cancellation: Normal   Head-Impulse Test: HIT Right: negative HIT Left: positive   Dynamic Visual Acuity:  NT    M-CTSIB  Condition 1: Firm Surface, EO 30 Sec, Mild Sway  Condition 2: Firm Surface, EC 30 Sec, Moderate Sway  Condition 3: Foam Surface, EO 30 Sec, Moderate Sway  Condition 4: Foam Surface, EC 2-7 Sec, Severe Sway     POSITIONAL TESTING:  Right Roll Test: nystagmus (baseline); Duration: none Left Roll Test: no symptoms; Duration: none Right Dix-Hallpike: nystagmus almost appeared L rotatory upwards; Duration:>60 sec Left Dix-Hallpike: L upbeating rotatory nystagmus; Duration: 15 sec    Performed L Epley x 1, with pt tolerating well.  Pt does c/o mild dizziness in position 3 and upon return to sit.  PATIENT EDUCATION: Education details:  vestibular assessment findings, positional vertigo testing and treatment Person educated: Patient and Child(ren) Education method: Explanation Education comprehension: verbalized understanding     ------------------------------------------------------------ Note: Objective measures below were completed at Evaluation unless otherwise noted.  DIAGNOSTIC FINDINGS: none recent   COGNITION: Overall cognitive status: Difficulty to assess due to: HOH   SENSATION: Reports occasional N/T in UEs and LEs    POSTURE:  rounded shoulders, forward head, increased thoracic kyphosis, and flexed trunk   PALPATION: no TTP over R knee  ROM: 8 deg with knee compensating B with ankle DF AROM  LOWER EXTREMITY MMT:   MMT (in sitting) Right eval Left eval  Hip flexion 4- 4  Hip abduction 4 4  Hip adduction 4+ 4+  Hip internal rotation    Hip external rotation    Knee flexion 4 4+  Knee extension *gave way d/t knee pain  4+  Ankle dorsiflexion 4 4  Ankle plantarflexion 4 4  Ankle inversion    Ankle eversion    (Blank rows = not tested)   GAIT: Gait pattern: slightly flexed over walker, short step length Assistive device utilized: Environmental consultant - 2 wheeled Level of assistance: SBA  FUNCTIONAL TESTS:  5 times sit to stand: 18.48 sec pushing off armrests Timed up and go (TUG): 26.09 sec Berg Balance Scale: 30/56  [facility] PT Assessment - 04/03/23 0001       Berg Balance Test   Sit to Stand Able to stand without using hands and stabilize independently    Standing Unsupported Able to stand safely 2 minutes    Sitting with Back Unsupported but Feet Supported on Floor or Stool Able to sit safely and securely 2 minutes    Stand to Sit Sits safely with minimal use of hands    Transfers Able to transfer safely, definite need of hands    Standing Unsupported with  Eyes Closed Able to stand 10 seconds with supervision    Standing Unsupported with Feet Together Able to place feet together independently  and stand for 1 minute with supervision    From Standing, Reach Forward with Outstretched Arm Can reach forward >12 cm safely (5")    From Standing Position, Pick up Object from Floor Able to pick up shoe, needs supervision    From Standing Position, Turn to Look Behind Over each Shoulder Looks behind from both sides and weight shifts well    Turn 360 Degrees Able to turn 360 degrees safely in 4 seconds or less    Standing Unsupported, Alternately Place Feet on Step/Stool Able to complete >2 steps/needs minimal assist    Standing Unsupported, One Foot in Front Able to take small step independently and hold 30 seconds    Standing on One Leg Tries to lift leg/unable to hold 3 seconds but remains standing independently    Total Score 43               PATIENT SURVEYS:  FOTO pt denied dizziness until end of appointment    VESTIBULAR TREATMENT:                                                                                                   DATE: 02/21/23  PATIENT EDUCATION: Education details: prognosis, POC, HEP Person educated: Patient and Child(ren) Education method: Explanation, Demonstration, Tactile cues, Verbal cues, and Handouts Education comprehension: verbalized understanding and returned demonstration  HOME EXERCISE PROGRAM: Access Code: [email or web address] URL: [email or web address] Date: 02/21/2023 Prepared by: [facility] - Outpatient  Rehab - [facility]  Exercises - Seated Long Arc Quad  - 1 x daily - 5 x weekly - 2 sets - 10 reps - Standing March with Counter Support  - 1 x daily - 5 x weekly - 2 sets - 10 reps - Mini Squat with Counter Support  - 1 x daily - 5 x weekly - 2 sets - 10 reps   GOALS: Goals reviewed with patient? Yes  SHORT TERM GOALS: Target date: 03/14/2023  Patient to be independent with initial HEP. Baseline: HEP initiated Goal status: MET     LONG TERM GOALS: Target date: 04/04/2023  Patient to be independent with advanced  HEP. Baseline: Not yet initiated  Goal status: MET 04/03/23  Patient will report 0/10 dizziness with bed mobility.  Baseline:  reports 4/10 dizziness but notes it is "much better"  04/03/23 Goal status: NOT MET 04/03/23  Patient to demonstrate TUG with LRAD in <15 sec in order to reduce risk of falls.  Baseline: Timed up and go (TUG): 26.09 sec; 16.32 sec with RW 04/03/23 Goal status: NOT MET  04/03/23  Patient to score at least 40/56 on Berg in order to decrease risk of falls. Baseline: 30/56; 43/56 04/03/23 Goal status: MET 04/03/23  Patient to demonstrate 5xSTS in <15 sec with minimal use of UEs for improved transfer efficiency.  Baseline: 5 times sit to stand: 18.48 sec pushing off armrests; 19.82 sec with minor UE use and use of RW for balance 04/03/23  Goal status: NOT MET 04/03/23    ASSESSMENT:  CLINICAL IMPRESSION: Patient arrived to session without new complaints. Reports improvement in dizziness since start of session and reports tolerating HEP at home. Patient reports 4/10 dizziness with bed mobility but notes that this is improved from initial assessment. TUG testing revealed increase in gait speed but goal not yet met. Patient was able to perform 5xSTS with reduced UE support. Patient scored 43/56 on Berg, improved from initial assessment and meeting this goal. Patient required review and correction of form with HEP but he tolerated these activities well and without imbalance. He reports that he would like to take a break from PT d/t improvement in symptoms.   OBJECTIVE IMPAIRMENTS: Abnormal gait, decreased activity tolerance, decreased balance, decreased endurance, difficulty walking, decreased ROM, decreased strength, dizziness, impaired flexibility, and postural dysfunction.   ACTIVITY LIMITATIONS: carrying, lifting, bending, sitting, standing, squatting, stairs, transfers, bathing, toileting, dressing, reach over head, hygiene/grooming, and locomotion  level  PARTICIPATION LIMITATIONS: meal prep, cleaning, laundry, shopping, community activity, and church  PERSONAL FACTORS: Age, Past/current experiences, Time since onset of injury/illness/exacerbation, Transportation, and 3+ comorbidities: Angina, CAD, cerebral aneurysm, CHF, HTN, HOH, stroke 2017, HLD, a-fib, NSVT, prostate CA s/p surgery and radiation, VBI  are also affecting patient's functional outcome.   REHAB POTENTIAL: Good  CLINICAL DECISION MAKING: Evolving/moderate complexity  EVALUATION COMPLEXITY: Moderate   PLAN:  PT FREQUENCY: 1-2x/week  PT DURATION: 6 weeks  PLANNED INTERVENTIONS: 97164- PT Re-evaluation, 97110-Therapeutic exercises, 97530- Therapeutic activity, 97112- Neuromuscular re-education, 97535- Self Care, 40981- Manual therapy, [phone]- Gait training, [phone]- Canalith repositioning, 97014- Electrical stimulation (unattended), Patient/Family education, Balance training, Stair training, Taping, Dry Needling, Vestibular training, Cryotherapy, and Moist heat  PLAN FOR NEXT SESSION: 30 day hold at this time  Baldemar Friday, PT, DPT 04/03/23 4:15 PM  [facility] [address], [address] [place], [place] [address] Phone # [phone] Fax # [phone]

## 2023-04-03 ENCOUNTER — Encounter: Payer: Self-pay | Admitting: Physical Therapy

## 2023-04-03 ENCOUNTER — Ambulatory Visit: Payer: Medicare Other | Admitting: Physical Therapy

## 2023-04-03 DIAGNOSIS — R42 Dizziness and giddiness: Secondary | ICD-10-CM

## 2023-04-03 DIAGNOSIS — R2689 Other abnormalities of gait and mobility: Secondary | ICD-10-CM | POA: Diagnosis not present

## 2023-04-03 DIAGNOSIS — M6281 Muscle weakness (generalized): Secondary | ICD-10-CM | POA: Diagnosis not present

## 2023-04-03 DIAGNOSIS — R2681 Unsteadiness on feet: Secondary | ICD-10-CM

## 2023-04-04 ENCOUNTER — Other Ambulatory Visit (HOSPITAL_COMMUNITY): Payer: Self-pay

## 2023-04-04 MED ORDER — FINASTERIDE 5 MG PO TABS
5.0000 mg | ORAL_TABLET | Freq: Every day | ORAL | 3 refills | Status: AC
  Filled 2023-04-04: qty 90, 90d supply, fill #0
  Filled 2023-07-19: qty 90, 90d supply, fill #1
  Filled 2023-10-14: qty 90, 90d supply, fill #2
  Filled 2024-02-14: qty 90, 90d supply, fill #3

## 2023-04-06 NOTE — Telephone Encounter (Signed)
Appt Date:05/10/2023

## 2023-04-18 ENCOUNTER — Other Ambulatory Visit: Payer: Self-pay

## 2023-04-26 ENCOUNTER — Other Ambulatory Visit (HOSPITAL_COMMUNITY): Payer: Self-pay

## 2023-05-02 ENCOUNTER — Telehealth: Payer: Self-pay | Admitting: Cardiovascular Disease

## 2023-05-02 ENCOUNTER — Other Ambulatory Visit (HOSPITAL_COMMUNITY): Payer: Self-pay

## 2023-05-02 MED ORDER — APIXABAN 2.5 MG PO TABS: 2.5000 mg | ORAL_TABLET | Freq: Two times a day (BID) | ORAL | Status: DC

## 2023-05-02 NOTE — Telephone Encounter (Signed)
 Pt c/o medication issue:  1. Name of Medication:   apixaban  (ELIQUIS ) 2.5 MG TABS tablet   2. How are you currently taking this medication (dosage and times per day)?   3. Are you having a reaction (difficulty breathing--STAT)?   4. What is your medication issue?   Daughter Elray) stated patient will need assistance getting this medication and wants to pick up an application form for 2025.  Daughter stated patient will need sample medication until his application is approved.

## 2023-05-02 NOTE — Telephone Encounter (Signed)
 Samples of eliquis 2.5mg  BID -- 3 boxes of samples provided.   Patient assistance application printed/highlighted -- provided with samples.   Daughter notified.

## 2023-05-05 ENCOUNTER — Telehealth: Payer: Self-pay | Admitting: Cardiovascular Disease

## 2023-05-05 DIAGNOSIS — I480 Paroxysmal atrial fibrillation: Secondary | ICD-10-CM

## 2023-05-05 NOTE — Telephone Encounter (Signed)
Paper Work Dropped Off: medication assistance  Date: 05/05/2023  Location of paper:  Dr. Allyson Sabal Box

## 2023-05-08 MED ORDER — APIXABAN 2.5 MG PO TABS: 2.5000 mg | ORAL_TABLET | Freq: Two times a day (BID) | ORAL | 3 refills | Status: DC

## 2023-05-08 NOTE — Telephone Encounter (Signed)
Pt assistance application for [employer] (Eliquis) completed and faxed with confirmation of successful fax received.

## 2023-05-09 DIAGNOSIS — R1084 Generalized abdominal pain: Secondary | ICD-10-CM | POA: Diagnosis not present

## 2023-05-09 DIAGNOSIS — Z8546 Personal history of malignant neoplasm of prostate: Secondary | ICD-10-CM | POA: Diagnosis not present

## 2023-05-09 DIAGNOSIS — N200 Calculus of kidney: Secondary | ICD-10-CM | POA: Diagnosis not present

## 2023-05-09 DIAGNOSIS — R8271 Bacteriuria: Secondary | ICD-10-CM | POA: Diagnosis not present

## 2023-05-10 ENCOUNTER — Ambulatory Visit: Payer: Medicare Other | Admitting: Neurology

## 2023-05-13 ENCOUNTER — Other Ambulatory Visit (HOSPITAL_COMMUNITY): Payer: Self-pay

## 2023-05-13 ENCOUNTER — Other Ambulatory Visit (HOSPITAL_BASED_OUTPATIENT_CLINIC_OR_DEPARTMENT_OTHER): Payer: Self-pay

## 2023-05-13 MED ORDER — SULFAMETHOXAZOLE-TRIMETHOPRIM 800-160 MG PO TABS
1.0000 | ORAL_TABLET | Freq: Two times a day (BID) | ORAL | 0 refills | Status: DC
  Filled 2023-05-13: qty 14, 7d supply, fill #0

## 2023-05-23 NOTE — Telephone Encounter (Signed)
Pt daughter called in asking if you have heard anything back from [facility]

## 2023-05-24 ENCOUNTER — Other Ambulatory Visit (HOSPITAL_COMMUNITY): Payer: Self-pay

## 2023-05-25 ENCOUNTER — Other Ambulatory Visit (HOSPITAL_COMMUNITY): Payer: Self-pay

## 2023-05-25 MED ORDER — PANTOPRAZOLE SODIUM 40 MG PO TBEC
40.0000 mg | DELAYED_RELEASE_TABLET | Freq: Every day | ORAL | 0 refills | Status: DC
  Filled 2023-05-25: qty 90, 90d supply, fill #0

## 2023-05-25 NOTE — Telephone Encounter (Signed)
 Daughter stopped by office today asking about update

## 2023-05-26 MED ORDER — APIXABAN 2.5 MG PO TABS: 2.5000 mg | ORAL_TABLET | Freq: Two times a day (BID) | ORAL | Status: AC

## 2023-05-26 NOTE — Telephone Encounter (Signed)
 Called [place] regarding pt's application status. Spoke with Reena who reports that application has started to be processed but a decision has not been made yet, she states that she will escalate this application to get an answer as soon as possible.   Called pt's daughter, Darice (ok per [facility]) with this information. She states that she has less than a weeks worth of medication. Will provide 2 weeks of samples. Darice will pick them up on Monday.

## 2023-05-29 ENCOUNTER — Other Ambulatory Visit (HOSPITAL_COMMUNITY): Payer: Self-pay

## 2023-06-05 NOTE — Telephone Encounter (Signed)
 Spoke with pt's daughter, Clydie Braun (ok per [facility]) regarding approval from [employer] pt assistance. Daughter states that they just received their first 3 month supply. Advised that pt assistance will last until 04/17/24. Advised to request refill prior to new year and bring back pt assistance application in late December. Daughter verbalizes understanding.

## 2023-06-06 ENCOUNTER — Ambulatory Visit: Payer: Medicare Other | Admitting: Neurology

## 2023-06-06 ENCOUNTER — Other Ambulatory Visit: Payer: Medicare Other

## 2023-06-06 ENCOUNTER — Encounter: Payer: Self-pay | Admitting: Neurology

## 2023-06-06 VITALS — BP 124/83 | HR 95 | Ht 65.0 in | Wt 140.4 lb

## 2023-06-06 DIAGNOSIS — I671 Cerebral aneurysm, nonruptured: Secondary | ICD-10-CM

## 2023-06-06 DIAGNOSIS — H5509 Other forms of nystagmus: Secondary | ICD-10-CM

## 2023-06-06 DIAGNOSIS — G629 Polyneuropathy, unspecified: Secondary | ICD-10-CM

## 2023-06-06 NOTE — Patient Instructions (Addendum)
 Good to meet you!  Have bloodwork done for TSH, B12, B1, ESR, CRP, SPEP, IFE, folate. We have sent a referral to [facility] for your MRI and they will call you directly to schedule your appointment. They are located at [address] [facility]. If you need to contact them directly please call [phone].   2. Schedule MRI brain without contrast and MRA head with and without contrast. We have sent a referral to [facility] for your MRI and they will call you directly to schedule your appointment. They are located at [address] [facility]. If you need to contact them directly please call [phone].   3. Once we get MRI results, if no significant changes, would recommend resuming Physical therapy to work on balance and dizziness  4. Follow-up as needed, call for any changes

## 2023-06-06 NOTE — Progress Notes (Signed)
 NEUROLOGY CONSULTATION NOTE  Richard Reed MRN: [id number] DOB: 1925-12-05  Referring provider: Edd Fabian, NP Primary care provider: Peri Maris, FNP  Reason for consult:  positional nystagmus   Thank you for your kind referral of Richard Reed for consultation of the above symptoms. Although his history is well known to you, please allow me to reiterate it for the purpose of our medical record. The patient was accompanied to the clinic by his daughter Clydie Braun who also provides collateral information. Records and images were personally reviewed where available.   HISTORY OF PRESENT ILLNESS: This is a very pleasant 89 year old right-handed man with a history of hypertension, hyperlipidemia, NSVT, NICM, PAF, prostate cancer, presenting for evaluation of positional nystagmus. His daughter Clydie Braun is present to provide additional information. He was referred to physical therapy in 02/2023 for feeling off balance. He reports both legs are weak and he would be staggering when he stands. He was evaluated by physical therapy and found to have nystagmus which Clydie Braun reports was diagnosed over 20 years ago, he had seen an eye doctor who diagnosed him with downbeat nystagmus. He is very aware of things "bouncing." When he was evaluated by Physical Therapy, there was nystagmus at rest and with eye motions, he was noted to have >60 seconds of spinning sensation and nystagmus with R Dix-Hallpike (which appears upbeating to L), and L upbeating nystagmus for 10-15 seconds with L Dix-Hallpike. Despite canalith repositioning maneuvers, positional nystagmus did not seem to align with specific semicircular canal and further CNS workup was recommended. On his last PT visit in 03/2023, there was some improvement in dizziness and increased gait speed so he requested a break from PT.   He denies any significant headaches, double vision, dysarthria/dysphagia, neck pain, focal numbness/tingling. He has back pain and  cannot stand for prolonged periods. He has occasional constipation and an overactive bladder, wearing Depends. He has very little spinning sensation and denies any lightheadedness. He states he can go several days feeling fine then "hits me again." When he gets out of bed, he does home PT exercises before getting up every morning. Despite wearing hearing aids, he reports difficulty understanding speech but hearing it. Last fall was 3 weeks ago. He does stumble and did so last week getting into his apartment. 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 He lives with his wife at [facility]. No alcohol use. No family history of nystagmus.   He had previously seen interventional radiologist Dr. Corliss Skains for worsening vertebrobasilar ischemic symptoms of dizziness, gait imbalance. His last cerebral angiogram in 2020 showed probable high grade stenosis of the left vertebrobasilar junction, and also of the proximal basilar artery just proximal to the origin of the anterior-inferior cerebellar artery, approximately 60-70% stenosis of the origin of the left vertebral artery, stable fusiform aneurysm of the distal right MCA artery extending into the inferior division, stable fusiform prominence of the left ICA proximal cavernous segment. Last CTA head and neck in 2021 showed unchanged severe multifocal stenosis of the midbasilar artery and stenosis of both vertebral artery V4 segments.   PAST MEDICAL HISTORY: Past Medical History:  Diagnosis Date   Angina    Aortic valve insufficiency, moderate    a. 02/2016 Echo: mild AS, mild to mod AI.   Arthritis    Arthritis pain    CAD (coronary artery disease)    a. 09/2010 Cath: RCA 40-60 ost, otw nl cors;  b. 03/2016 Lexiscan MV: EF 46%, no ischemia, low risk.   Cancer [facility])    Carotid disease, bilateral (HCC)    a. 02/2016 Carotid U/S: 1-39% bilat ICA plaquing.   Cerebral aneurysm    a. 02/2016 MRA: fusiform  bilobed aneurysm @ R MCA bifurcation.   Cerebrovascular disease    a. 02/2016 MRA: long segment narrowing of basilar artery, moderate bilat posterior inferior cerebellar artery dzs.   Chronic combined systolic and diastolic CHF (congestive heart failure) (HCC)    a. 02/2016 Admitted to hospital in [place] w/ CHF;  b. 02/2016 Echo ([facility]): EF 45-50%, diff HK, gr1DD, mild AS, mild to mod AI, mildly dil Ao root, PASP .   CKD (chronic kidney disease), stage III (HCC)    Dysrhythmia    Essential hypertension    Hard of hearing    Hemiparesis and alteration of sensations as late effects of stroke (HCC) 04/29/2015   Hiatal hernia    History of kidney stones    HOH (hard of hearing)    Hyperlipidemia    Kidney stones    Moderate aortic insufficiency    NICM (nonischemic cardiomyopathy) (HCC)    a. 02/2016 Echo: EF 45-50%, diff HK;  b. 03/2016 Lexiscan MV: No ischemia, EF 46%.   NSVT (nonsustained ventricular tachycardia) (HCC)    a. 03/2016 noted on event monitor.   PAF (paroxysmal atrial fibrillation) (HCC)    a. 2012 - noted in setting of E coli bacteremia and septic shock.   Pneumonia 03/23/2011   "first time"   Prostate cancer (HCC)    Sepsis due to Escherichia coli (HCC) 03/24/11   Syncope    a. 02/2016 presumed to be 2/2 hypotension/dehydration and basilar artery dzs noted on MRA.   TIA (transient ischemic attack)    Vertebrobasilar insufficiency 04/28/2016   Visual disturbance 03/19/2014    PAST SURGICAL HISTORY: Past Surgical History:  Procedure Laterality Date   CARDIAC CATHETERIZATION  09/2010   non obstructive CAD, 40-60% ostial RCA stenosis without dampening    CATARACT EXTRACTION Bilateral    cerebral angiography     CHOLECYSTECTOMY  04/15/2011   Procedure: LAPAROSCOPIC CHOLECYSTECTOMY WITH INTRAOPERATIVE CHOLANGIOGRAM;  Surgeon: Jetty Duhamel, MD;  Location: [facility] OR;  Service: General;  Laterality: N/A;   CYSTOSCOPY WITH STENT PLACEMENT Bilateral 04/07/2022    Procedure: CYSTOSCOPY WITH BILATERAL STENT PLACEMENT;  Surgeon: Jerilee Field, MD;  Location: [facility] ORS;  Service: Urology;  Laterality: Bilateral;  30 MINS FRO CASE   CYSTOSCOPY/URETEROSCOPY/HOLMIUM LASER/STENT PLACEMENT Bilateral 05/03/2022   Procedure: CYSTOSCOPY BILATERAL URETEROSCOPY/HOLMIUM LASER/STENT PLACEMENT;  Surgeon: Jerilee Field, MD;  Location: [facility];  Service: Urology;  Laterality: Bilateral;  2 HRS FOR CASE   EXTRACORPOREAL SHOCK WAVE LITHOTRIPSY Left 12/12/2016   Procedure: LEFT EXTRACORPOREAL SHOCK WAVE LITHOTRIPSY (ESWL);  Surgeon: Crist Fat, MD;  Location: [facility];  Service: Urology;  Laterality: Left;   IR ANGIO INTRA EXTRACRAN SEL COM CAROTID INNOMINATE BILAT MOD SED  03/21/2019   IR ANGIO VERTEBRAL SEL SUBCLAVIAN INNOMINATE BILAT MOD SED  03/21/2019   IR ANGIOGRAM SELECTIVE EACH ADDITIONAL VESSEL  04/20/2022   IR ANGIOGRAM SELECTIVE EACH ADDITIONAL VESSEL  04/20/2022   IR ANGIOGRAM VISCERAL SELECTIVE  04/20/2022   IR EMBO ART  VEN HEMORR LYMPH EXTRAV  INC GUIDE ROADMAPPING  04/20/2022   IR GENERIC HISTORICAL  03/17/2016   IR ANGIO VERTEBRAL SEL SUBCLAVIAN INNOMINATE BILAT MOD SED 03/17/2016 Julieanne Cotton, MD [facility]   IR GENERIC HISTORICAL  03/17/2016   IR  ANGIO INTRA EXTRACRAN SEL COM CAROTID INNOMINATE BILAT MOD SED 03/17/2016 Julieanne Cotton, MD [facility]   IR US GUIDE VASC ACCESS RIGHT  04/20/2022   PROSTATE SURGERY     approx 10 years ago, seed implant radiation   TONSILLECTOMY      MEDICATIONS: Current Outpatient Medications on File Prior to Visit  Medication Sig Dispense Refill   apixaban (ELIQUIS) 2.5 MG TABS tablet Take 1 tablet (2.5 mg total) by mouth 2 (two) times daily.     B Complex-C (SUPER B COMPLEX PO) Take 1 tablet by mouth daily.     diltiazem (CARDIZEM CD) 180 MG 24 hr capsule Take 1 capsule (180 mg total) by mouth daily. 30 capsule 4   erythromycin ophthalmic ointment APPLY INTO THE LOWER CONJUNCTIVAL SAC IN THE RIGHT EYE 3 TIMES  PER DAY FOR 10 DAYS. 3.5 g 0   feeding supplement, ENSURE ENLIVE, (ENSURE ENLIVE) LIQD Take 237 mLs by mouth 2 (two) times daily between meals. (Patient taking differently: Take 237 mLs by mouth daily.) 237 mL 12   finasteride (PROSCAR) 5 MG tablet Take 1 tablet (5 mg total) by mouth daily. 90 tablet 3   furosemide (LASIX) 20 MG tablet Take 1 tablet (20 mg total) by mouth daily. 90 tablet 3   metoprolol tartrate (LOPRESSOR) 25 MG tablet Take 1 tablet (25 mg total) by mouth 2 (two) times daily. 180 tablet 3   Multiple Vitamins-Minerals (PRESERVISION AREDS 2) CAPS Take 1 capsule by mouth in the morning and at bedtime.     pantoprazole (PROTONIX) 40 MG tablet Take 1 tablet (40 mg total) by mouth with a meal once daily 90 tablet 0   Propylene Glycol (SYSTANE BALANCE OP) Place 1 drop into both eyes 2 (two) times daily as needed (for dryness).     rosuvastatin (CRESTOR) 10 MG tablet Take 1 tablet (10 mg total) by mouth daily. 90 tablet 1   tamsulosin (FLOMAX) 0.4 MG CAPS capsule Take 1 capsule (0.4 mg total) by mouth daily. 90 capsule 3   No current facility-administered medications on file prior to visit.    ALLERGIES: No Known Allergies  FAMILY HISTORY: Family History  Problem Relation Age of Onset   Heart failure Mother    Heart disease Mother    Heart disease Father    Cancer Brother        prostate    SOCIAL HISTORY: Social History   Socioeconomic History   Marital status: Married    Spouse name: Not on file   Number of children: 2   Years of education: HS   Highest education level: Not on file  Occupational History   Occupation: Retired  Tobacco Use   Smoking status: Never   Smokeless tobacco: Never  Vaping Use   Vaping status: Never Used  Substance and Sexual Activity   Alcohol use: No   Drug use: No   Sexual activity: Yes  Other Topics Concern   Not on file  Social History Narrative   Medical Decision Maker:  MANG HAZELRIGG (wife) Cell. [phone] House:  [phone].   Daughter: Kandis Mannan: Cell [phone]   Full Code   Patient is Left handed.   Patient drinks 1 cup of caffeine daily.   Patient is left handed.   Lives at [facility]    Social Drivers of Health   Financial Resource Strain: Not on file  Food Insecurity: No Food Insecurity (04/21/2022)   Hunger Vital Sign    Worried About Running Out  of Food in the Last Year: Never true    Ran Out of Food in the Last Year: Never true  Transportation Needs: No Transportation Needs (04/21/2022)   PRAPARE - Administrator, Civil Service (Medical): No    Lack of Transportation (Non-Medical): No  Physical Activity: Not on file  Stress: Not on file  Social Connections: Not on file  Intimate Partner Violence: Not At Risk (04/21/2022)   Humiliation, Afraid, Rape, and Kick questionnaire    Fear of Current or Ex-Partner: No    Emotionally Abused: No    Physically Abused: No    Sexually Abused: No     PHYSICAL EXAM: Vitals:   06/06/23 0928 06/06/23 0935  BP: (!) 104/58 124/83  Pulse: 95   SpO2: 99%    General: No acute distress Head:  Normocephalic/atraumatic Skin/Extremities: No rash, no edema; purplish discoloration of both feet Neurological Exam: Mental status: alert and awake, no dysarthria or aphasia, Fund of knowledge is appropriate.  Attention and concentration are normal.     Cranial nerves: CN I: not tested CN II: pupils equal, round, visual fields intact CN III, IV, VI:  full range of motion. There is downbeat nystagmus on primary gaze. It is also seen on all directions of gaze, worse on lateral and down gaze.  CN V: facial sensation intact CN VII: upper and lower face symmetric CN VIII: hearing intact to conversation CN XI: sternocleidomastoid and trapezius muscles intact CN XII: tongue midline Bulk & Tone: normal, no fasciculations. Motor: 5/5 throughout with no pronator drift. Sensation: intact to light touch, cold, pin on both UE, decreased cold  and vibration sense to knees bilaterally, intact pin on both LE. No extinction to double simultaneous stimulation.  Romberg test negative Deep Tendon Reflexes: +1 throughout, no ankle clonus Plantar responses: downgoing bilaterally Cerebellar: no incoordination on finger to nose testing Gait: ambulates with walker, gait is narrow-based with no ataxia, no staggering today Tremor: none   IMPRESSION: This is a very pleasant 89 year old right-handed man with a history of hypertension, hyperlipidemia, NSVT, NICM, PAF, prostate cancer, presenting for evaluation of positional nystagmus. He has had a diagnosis of downbeat nystagmus for over 20 years. Nystagmus was noted on Physical Therapy evaluation for gait imbalance. He reports staggering gait, on review this appears to be a chronic issue since at least 2020 where cerebral angiogram was done for worsening vertebrobasilar ischemic symptoms of dizziness, gait imbalance. At that time, there was note of right MCA aneurysm and severe multifocal stenosis of the midbasilar artery and stenosis of both vertebral artery V4 segments. We discussed doing a brain MRI without contrast to assess for underlying focal abnormality, as well as MRA head with and without contrast for interval follow-up on intracranial stenosis. We discussed gait imbalance is likely multifactorial with this prior history, back pain, and neuropathy on exam. Neuropathy labs will be ordered. Once imaging is complete and there are no significant changes seen, would recommend resuming PT for balance and dizziness.    Thank you for allowing me to participate in the care of this patient. Please do not hesitate to call for any questions or concerns.   Patrcia Dolly, M.D.  CC: Edd Fabian, NP, Peri Maris, FNP

## 2023-06-07 ENCOUNTER — Telehealth: Payer: Self-pay | Admitting: Neurology

## 2023-06-07 DIAGNOSIS — G629 Polyneuropathy, unspecified: Secondary | ICD-10-CM

## 2023-06-07 DIAGNOSIS — I671 Cerebral aneurysm, nonruptured: Secondary | ICD-10-CM

## 2023-06-07 DIAGNOSIS — H5509 Other forms of nystagmus: Secondary | ICD-10-CM

## 2023-06-07 NOTE — Telephone Encounter (Signed)
 Patient daughter called and left VM stating that Dr Karel Jarvis order some imaging and they are having some problems and needs to speak to someone

## 2023-06-08 NOTE — Telephone Encounter (Signed)
 Pt daughter called she stated that  imaging needed the model and serial number from his stent in his kidney. She has called [facility] and the hospital  they can not give her any information she is asking is the scan needed? She is asking can you get the information you need from the blood work that you got at his appointment?

## 2023-06-09 LAB — SEDIMENTATION RATE: Sed Rate: 9 mm/h (ref 0–20)

## 2023-06-09 LAB — B12 AND FOLATE PANEL
Folate: >24 ng/mL
Vitamin B-12: 504 pg/mL (ref 200–1100)

## 2023-06-09 LAB — PROTEIN ELECTROPHORESIS, SERUM
Albumin ELP: 4 g/dL (ref 3.8–4.8)
Alpha 1: 0.3 g/dL (ref 0.2–0.3)
Alpha 2: 0.7 g/dL (ref 0.5–0.9)
Beta 2: 0.4 g/dL (ref 0.2–0.5)
Beta Globulin: 0.4 g/dL (ref 0.4–0.6)
Gamma Globulin: 0.9 g/dL (ref 0.8–1.7)
Total Protein: 6.6 g/dL (ref 6.1–8.1)

## 2023-06-09 LAB — IMMUNOFIXATION ELECTROPHORESIS
IgM, Serum: 65 mg/dL (ref 50–300)
IgM, Serum: 981 mg/dL (ref 600–300)
Immunoglobulin A: 267 mg/dL (ref 70–320)
Immunoglobulin A: 981 mg/dL (ref 70–320)

## 2023-06-09 LAB — TSH: TSH: 2.41 m[IU]/L (ref 0.40–4.50)

## 2023-06-09 LAB — C-REACTIVE PROTEIN: CRP: <3 mg/L (ref <8.0)

## 2023-06-19 ENCOUNTER — Other Ambulatory Visit (HOSPITAL_COMMUNITY): Payer: Self-pay

## 2023-06-19 DIAGNOSIS — H0012 Chalazion right lower eyelid: Secondary | ICD-10-CM | POA: Diagnosis not present

## 2023-06-19 DIAGNOSIS — H02882 Meibomian gland dysfunction right lower eyelid: Secondary | ICD-10-CM | POA: Diagnosis not present

## 2023-06-19 DIAGNOSIS — Z09 Encounter for follow-up examination after completed treatment for conditions other than malignant neoplasm: Secondary | ICD-10-CM | POA: Diagnosis not present

## 2023-06-19 DIAGNOSIS — H57813 Brow ptosis, bilateral: Secondary | ICD-10-CM | POA: Diagnosis not present

## 2023-06-19 DIAGNOSIS — H0279 Other degenerative disorders of eyelid and periocular area: Secondary | ICD-10-CM | POA: Diagnosis not present

## 2023-06-19 MED ORDER — ERYTHROMYCIN 5 MG/GM OP OINT
TOPICAL_OINTMENT | OPHTHALMIC | 5 refills | Status: AC
  Filled 2023-06-19: qty 3.5, 7d supply, fill #0
  Filled 2023-07-19: qty 3.5, 7d supply, fill #1
  Filled 2024-03-08: qty 3.5, 7d supply, fill #2

## 2023-06-20 ENCOUNTER — Other Ambulatory Visit (HOSPITAL_COMMUNITY): Payer: Self-pay

## 2023-06-22 ENCOUNTER — Encounter: Payer: Self-pay | Admitting: Neurology

## 2023-06-22 ENCOUNTER — Other Ambulatory Visit (HOSPITAL_COMMUNITY): Payer: Self-pay

## 2023-06-22 DIAGNOSIS — Z03818 Encounter for observation for suspected exposure to other biological agents ruled out: Secondary | ICD-10-CM | POA: Diagnosis not present

## 2023-06-22 DIAGNOSIS — R112 Nausea with vomiting, unspecified: Secondary | ICD-10-CM | POA: Diagnosis not present

## 2023-06-22 DIAGNOSIS — R197 Diarrhea, unspecified: Secondary | ICD-10-CM | POA: Diagnosis not present

## 2023-06-22 MED ORDER — OSELTAMIVIR PHOSPHATE 30 MG PO CAPS
ORAL_CAPSULE | ORAL | 0 refills | Status: DC
  Filled 2023-06-22: qty 11, 5d supply, fill #0

## 2023-06-22 NOTE — Telephone Encounter (Signed)
 Pls let daughter know the bloodwork was for neuropathy, which was normal, no reversible causes of neuropathy seen. The imaging was for the nystagmus and balance changes, he has a history of aneurysm and narrowing of blood vessels seen in 2021. We can just do a CTA head with and without contrast and a head CT without contrast if unable to do MRI. Will need creatinine level before doing CTA. Thanks

## 2023-06-23 ENCOUNTER — Other Ambulatory Visit (HOSPITAL_COMMUNITY): Payer: Self-pay

## 2023-06-23 ENCOUNTER — Telehealth: Payer: Self-pay

## 2023-06-23 NOTE — Telephone Encounter (Signed)
 Pt daughter called an informed that  the bloodwork was for neuropathy, which was normal, no reversible causes of neuropathy seen. The imaging was for the nystagmus and balance changes, he has a history of aneurysm and narrowing of blood vessels seen in 2021. We can just do a CTA head with and without contrast and a head CT without contrast if unable to do MRI. Will need creatinine level before doing CTA. Pt is being treated for the FLU at this time, once he is better she will bring him in for labs and get the CT / CTA

## 2023-06-23 NOTE — Telephone Encounter (Signed)
 Pansy and Felicia at [facility] stated that a CT with out was not needed an DC the order because they stated that in the CTA would get the  CT with out contrast would be done,

## 2023-06-29 ENCOUNTER — Other Ambulatory Visit

## 2023-06-29 DIAGNOSIS — I671 Cerebral aneurysm, nonruptured: Secondary | ICD-10-CM | POA: Diagnosis not present

## 2023-06-29 DIAGNOSIS — H5509 Other forms of nystagmus: Secondary | ICD-10-CM | POA: Diagnosis not present

## 2023-06-29 DIAGNOSIS — G629 Polyneuropathy, unspecified: Secondary | ICD-10-CM | POA: Diagnosis not present

## 2023-06-30 LAB — CREATININE, SERUM: Creat: 1.52 mg/dL — ABNORMAL HIGH (ref 0.70–1.22)

## 2023-07-07 ENCOUNTER — Other Ambulatory Visit

## 2023-07-11 ENCOUNTER — Other Ambulatory Visit

## 2023-07-18 DIAGNOSIS — R059 Cough, unspecified: Secondary | ICD-10-CM | POA: Diagnosis not present

## 2023-07-18 DIAGNOSIS — U071 COVID-19: Secondary | ICD-10-CM | POA: Diagnosis not present

## 2023-07-19 ENCOUNTER — Other Ambulatory Visit: Payer: Self-pay

## 2023-07-19 ENCOUNTER — Other Ambulatory Visit (HOSPITAL_BASED_OUTPATIENT_CLINIC_OR_DEPARTMENT_OTHER): Payer: Self-pay

## 2023-07-19 ENCOUNTER — Other Ambulatory Visit: Payer: Self-pay | Admitting: Cardiovascular Disease

## 2023-07-19 ENCOUNTER — Other Ambulatory Visit (HOSPITAL_COMMUNITY): Payer: Self-pay

## 2023-07-19 MED ORDER — DILTIAZEM HCL ER COATED BEADS 180 MG PO CP24
180.0000 mg | ORAL_CAPSULE | Freq: Every day | ORAL | 1 refills | Status: DC
  Filled 2023-07-19: qty 90, 90d supply, fill #0
  Filled 2023-10-14: qty 90, 90d supply, fill #1

## 2023-07-19 MED ORDER — ROSUVASTATIN CALCIUM 10 MG PO TABS
10.0000 mg | ORAL_TABLET | Freq: Every day | ORAL | 1 refills | Status: DC
  Filled 2023-07-19: qty 90, 90d supply, fill #0
  Filled 2023-10-14: qty 90, 90d supply, fill #1

## 2023-07-19 MED ORDER — LAGEVRIO 200 MG PO CAPS
4.0000 | ORAL_CAPSULE | Freq: Two times a day (BID) | ORAL | 0 refills | Status: DC
  Filled 2023-07-19: qty 40, 5d supply, fill #0

## 2023-07-19 MED ORDER — FUROSEMIDE 20 MG PO TABS
20.0000 mg | ORAL_TABLET | Freq: Every day | ORAL | 1 refills | Status: DC
  Filled 2023-07-19: qty 90, 90d supply, fill #0
  Filled 2023-10-14: qty 90, 90d supply, fill #1

## 2023-07-21 ENCOUNTER — Telehealth: Payer: Self-pay | Admitting: Cardiovascular Disease

## 2023-07-21 NOTE — Telephone Encounter (Signed)
 Spoke to patient's daughter she stated mother passed away recently and she wants to take father to [place].She requested appointment with Dr.Berry.Appointment scheduled with Dr.Berry 4/9 at 3:15 pm.

## 2023-07-21 NOTE — Telephone Encounter (Signed)
 FYI to Dr. Hazle Coca nurse  Daughter calling stating pt's wife has passed away and daughter wants to take him to [place] for a bit after the funeral. Daughter wants to know if Dr Allyson Sabal or a APP has an appt come open before 4/21 could we call her and move his appt up.

## 2023-07-22 ENCOUNTER — Other Ambulatory Visit (HOSPITAL_COMMUNITY): Payer: Self-pay

## 2023-07-22 DIAGNOSIS — U071 COVID-19: Secondary | ICD-10-CM | POA: Diagnosis not present

## 2023-07-22 DIAGNOSIS — S51811A Laceration without foreign body of right forearm, initial encounter: Secondary | ICD-10-CM | POA: Diagnosis not present

## 2023-07-26 ENCOUNTER — Ambulatory Visit: Attending: Cardiovascular Disease | Admitting: Cardiovascular Disease

## 2023-07-26 ENCOUNTER — Encounter: Payer: Self-pay | Admitting: Cardiovascular Disease

## 2023-07-26 VITALS — BP 98/60 | HR 88 | Ht 65.0 in | Wt 143.0 lb

## 2023-07-26 DIAGNOSIS — I480 Paroxysmal atrial fibrillation: Secondary | ICD-10-CM

## 2023-07-26 DIAGNOSIS — I351 Nonrheumatic aortic (valve) insufficiency: Secondary | ICD-10-CM | POA: Diagnosis not present

## 2023-07-26 DIAGNOSIS — I10 Essential (primary) hypertension: Secondary | ICD-10-CM

## 2023-07-26 DIAGNOSIS — N1832 Chronic kidney disease, stage 3b: Secondary | ICD-10-CM | POA: Diagnosis not present

## 2023-07-26 DIAGNOSIS — E785 Hyperlipidemia, unspecified: Secondary | ICD-10-CM | POA: Diagnosis not present

## 2023-07-26 DIAGNOSIS — I5032 Chronic diastolic (congestive) heart failure: Secondary | ICD-10-CM | POA: Diagnosis not present

## 2023-07-26 DIAGNOSIS — I2510 Atherosclerotic heart disease of native coronary artery without angina pectoris: Secondary | ICD-10-CM | POA: Diagnosis not present

## 2023-07-26 DIAGNOSIS — Z8546 Personal history of malignant neoplasm of prostate: Secondary | ICD-10-CM | POA: Diagnosis not present

## 2023-07-26 NOTE — Assessment & Plan Note (Signed)
 History of valvular heart disease with known EF of 30% by 2D echo with severe MR, AI and aortic stenosis as well.  He was decided not to pursue further imaging given his age and other comorbidities.

## 2023-07-26 NOTE — Assessment & Plan Note (Signed)
 History of dyslipidemia on statin therapy with lipid profile performed/9/24 revealing total cholesterol 112, LDL 52 and HDL 46.

## 2023-07-26 NOTE — Assessment & Plan Note (Signed)
History of persistent A. fib on Eliquis oral anticoagulation.

## 2023-07-26 NOTE — Progress Notes (Signed)
 07/26/2023 [name]   [birth year]  [id number]  Primary Physician Richard Pilon, FNP Primary Cardiologist: Richard Gess MD FACP, [place], [place], [place]  HPI:  Richard Reed is a 89 y.o.   thin-appearing recently widowed Caucasian male, father of 3, grandfather to 10 grandchildren, who I last saw in the office 06/08/2022.  Marland Kitchen He is accompanied by his daughter Richard Reed, his wife Richard Reed passed away/3/25 after being married 73 years.  She did have dementia.Marland Kitchen  He was hospitalized late 2012 with E. coli bacteremia and septic shock. He did develop some arrhythmias with PAF during that hospitalization. His EF was 45% to 50% with moderate aortic insufficiency. His other problems include hypertension and hyperlipidemia. I catheterized him in June 2012, revealing normal coronary arteries, with 40% to 60% ostial right coronary artery stenosis without damping. His right heart catheterization was unremarkable. He has had an uncomplicated laparoscopic cholecystectomy performed by Dr. Megan Reed, April 12, 2011.   His most recent lab work performed 04/25/16 revealed total cholesterol 99, LDL of 47 and HDL 39. He was admitted to the hospital in [place] in November of last year with congestive heart failure. He is begun on diuretics. He admits that this was probably related to dietary indiscretion. He did have an episode of syncope substantive thought to be related to dehydration. Workup included carotid Dopplers that were unremarkable. Cerebral angiography did reveal an MCA aneurysm as well as a stenosis at the vertebrobasilar junction demonstrated by angiography performed by Dr. Titus Reed. Medical therapy was recommended. A 2-D echo revealed low normal LV function with EF of 50%, mild AS and moderate AI with mild elevation of pulmonary artery pressures and Myoview stress test was low risk as well. Since adjusting his diet he has become for  the most part asymptomatic. He did see Richard Givens NP in the office 07/20/16  which time he was cardiac stable what was complaining of some left hip pain with ambulation. They did change the insoles and shoes which somewhat helped and try to statin holiday which did not impact his pain and therefore was placed back on statin therapy.   He  was hospitalized in [place] from 5/7 through the 13th of this month with pneumonia, A. fib and heart failure.  He was diuresed.  He is placed antibiotics.  2D echo revealed EF of 30% with severe MR. He does have aortic insufficiency and aortic stenosis as well.  He was decided not to pursue follow-up 2D echocardiograms.   Since I saw him in the office a year ago, his wife Richard Reed unfortunately passed away on/ 2023/08/14 after being married for 73 years.  She did have dementia.  He currently has an upper respiratory tract infection and is being seen by his primary care provider.  He denies chest pain   Current Meds  Medication Sig   apixaban (ELIQUIS) 2.5 MG TABS tablet Take 1 tablet (2.5 mg total) by mouth 2 (two) times daily.   B Complex-C (SUPER B COMPLEX PO) Take 1 tablet by mouth daily.   diltiazem (CARDIZEM CD) 180 MG 24 hr capsule Take 1 capsule (180 mg total) by mouth daily.   erythromycin ophthalmic ointment APPLY INTO THE LOWER CONJUNCTIVAL SAC IN THE RIGHT EYE 3 TIMES PER DAY FOR 10 DAYS.   feeding supplement, ENSURE ENLIVE, (ENSURE ENLIVE) LIQD Take 237 mLs by mouth 2 (two) times daily between meals. (Patient taking differently: Take 237 mLs by mouth daily.)   finasteride (PROSCAR) 5  MG tablet Take 1 tablet (5 mg total) by mouth daily.   furosemide (LASIX) 20 MG tablet Take 1 tablet (20 mg total) by mouth daily.   metoprolol tartrate (LOPRESSOR) 25 MG tablet Take 1 tablet (25 mg total) by mouth 2 (two) times daily.   Multiple Vitamins-Minerals (PRESERVISION AREDS 2) CAPS Take 1 capsule by mouth in the morning and at bedtime.   pantoprazole (PROTONIX) 40 MG tablet Take 1 tablet (40 mg total) by mouth with a meal once daily   Propylene  Glycol (SYSTANE BALANCE OP) Place 1 drop into both eyes 2 (two) times daily as needed (for dryness).   rosuvastatin (CRESTOR) 10 MG tablet Take 1 tablet (10 mg total) by mouth daily.   tamsulosin (FLOMAX) 0.4 MG CAPS capsule Take 1 capsule (0.4 mg total) by mouth daily.     No Known Allergies  Social History   Socioeconomic History   Marital status: Married    Spouse name: Not on file   Number of children: 2   Years of education: HS   Highest education level: Not on file  Occupational History   Occupation: Retired  Tobacco Use   Smoking status: Never   Smokeless tobacco: Never  Vaping Use   Vaping status: Never Used  Substance and Sexual Activity   Alcohol use: No   Drug use: No   Sexual activity: Yes  Other Topics Concern   Not on file  Social History Narrative   Medical Decision Maker:  Richard Reed (wife) Cell. [phone] House: [phone].   Daughter: Richard Reed: Cell [phone]   Full Code   Patient is Left handed.   Patient drinks 1 cup of caffeine daily.   Patient is left handed.   Lives at [facility]    Social Drivers of Health   Financial Resource Strain: Not on file  Food Insecurity: No Food Insecurity (04/21/2022)   Hunger Vital Sign    Worried About Running Out of Food in the Last Year: Never true    Ran Out of Food in the Last Year: Never true  Transportation Needs: No Transportation Needs (04/21/2022)   PRAPARE - Administrator, Civil Service (Medical): No    Lack of Transportation (Non-Medical): No  Physical Activity: Not on file  Stress: Not on file  Social Connections: Not on file  Intimate Partner Violence: Not At Risk (04/21/2022)   Humiliation, Afraid, Rape, and Kick questionnaire    Fear of Current or Ex-Partner: No    Emotionally Abused: No    Physically Abused: No    Sexually Abused: No     Review of Systems: General: negative for chills, fever, night sweats or weight changes.  Cardiovascular: negative for  chest pain, dyspnea on exertion, edema, orthopnea, palpitations, paroxysmal nocturnal dyspnea or shortness of breath Dermatological: negative for rash Respiratory: negative for cough or wheezing Urologic: negative for hematuria Abdominal: negative for nausea, vomiting, diarrhea, bright red blood per rectum, melena, or hematemesis Neurologic: negative for visual changes, syncope, or dizziness All other systems reviewed and are otherwise negative except as noted above.    Blood pressure 98/60, pulse 88, height 5\' 5"  (1.651 m), weight 143 lb (64.9 kg), SpO2 97%.  General appearance: alert and no distress Neck: no adenopathy, no carotid bruit, no JVD, supple, symmetrical, trachea midline, and thyroid not enlarged, symmetric, no tenderness/mass/nodules Lungs: clear to auscultation bilaterally Heart: irregularly irregular rhythm Extremities: 1+ edema bilaterally Pulses: 2+ and symmetric Skin: Skin color, texture, turgor normal. No  rashes or lesions Neurologic: Grossly normal  EKG not performed today      ASSESSMENT AND PLAN:   PAF (paroxysmal atrial fibrillation) (HCC) History of persistent A-fib on Eliquis oral anticoagulation.  Aortic valve insufficiency, moderate History of valvular heart disease with known EF of 30% by 2D echo with severe MR, AI and aortic stenosis as well.  He was decided not to pursue further imaging given his age and other comorbidities.  Essential hypertension History of essential hypertension with blood pressure measured today at 98/60.  He is on metoprolol.  Dyslipidemia History of dyslipidemia on statin therapy with lipid profile performed/9/24 revealing total cholesterol 112, LDL 52 and HDL 46.     Richard Gess MD [facility] 07/26/2023 3:48 PM

## 2023-07-26 NOTE — Patient Instructions (Signed)
 Medication Instructions:  NO CHANGES  *If you need a refill on your cardiac medications before your next appointment, please call your pharmacy*  Follow-Up: At [facility], you and your health needs are our priority.  As part of our continuing mission to provide you with exceptional heart care, our providers are all part of one team.  This team includes your primary Cardiologist (physician) and Advanced Practice Providers or APPs (Physician Assistants and Nurse Practitioners) who all work together to provide you with the care you need, when you need it.  Your next appointment:   6 months with Dr. Allyson Sabal  We recommend signing up for the patient portal called "MyChart".  Sign up information is provided on this After Visit Summary.  MyChart is used to connect with patients for Virtual Visits (Telemedicine).  Patients are able to view lab/test results, encounter notes, upcoming appointments, etc.  Non-urgent messages can be sent to your provider as well.   To learn more about what you can do with MyChart, go to [email or web address].        1st Floor: - Lobby - Registration  - Pharmacy  - Lab - Cafe  2nd Floor: - PV Lab - Diagnostic Testing (echo, CT, nuclear med)  3rd Floor: - Vacant  4th Floor: - TCTS (cardiothoracic surgery) - AFib Clinic - Structural Heart Clinic - Vascular Surgery  - Vascular Ultrasound  5th Floor: - HeartCare Cardiology (general and EP) - Clinical Pharmacy for coumadin, hypertension, lipid, weight-loss medications, and med management appointments    Valet parking services will be available as well.

## 2023-07-26 NOTE — Assessment & Plan Note (Signed)
 History of essential hypertension with blood pressure measured today at 98/60.  He is on metoprolol.

## 2023-07-27 DIAGNOSIS — Z0000 Encounter for general adult medical examination without abnormal findings: Secondary | ICD-10-CM | POA: Diagnosis not present

## 2023-07-27 DIAGNOSIS — N1832 Chronic kidney disease, stage 3b: Secondary | ICD-10-CM | POA: Diagnosis not present

## 2023-07-27 DIAGNOSIS — I480 Paroxysmal atrial fibrillation: Secondary | ICD-10-CM | POA: Diagnosis not present

## 2023-07-27 DIAGNOSIS — I10 Essential (primary) hypertension: Secondary | ICD-10-CM | POA: Diagnosis not present

## 2023-07-29 ENCOUNTER — Other Ambulatory Visit (HOSPITAL_COMMUNITY): Payer: Self-pay

## 2023-08-03 ENCOUNTER — Other Ambulatory Visit: Payer: Self-pay | Admitting: Cardiovascular Disease

## 2023-08-04 ENCOUNTER — Other Ambulatory Visit (HOSPITAL_COMMUNITY): Payer: Self-pay

## 2023-08-04 MED ORDER — METOPROLOL TARTRATE 25 MG PO TABS
25.0000 mg | ORAL_TABLET | Freq: Two times a day (BID) | ORAL | 3 refills | Status: DC
  Filled 2023-08-04: qty 180, 90d supply, fill #0
  Filled 2023-11-06: qty 180, 90d supply, fill #1
  Filled 2024-02-13: qty 180, 90d supply, fill #2

## 2023-08-15 ENCOUNTER — Ambulatory Visit: Payer: Medicare Other | Admitting: Cardiovascular Disease

## 2023-09-07 ENCOUNTER — Other Ambulatory Visit (HOSPITAL_COMMUNITY): Payer: Self-pay

## 2023-09-07 MED ORDER — PANTOPRAZOLE SODIUM 40 MG PO TBEC
40.0000 mg | DELAYED_RELEASE_TABLET | Freq: Every day | ORAL | 1 refills | Status: DC
  Filled 2023-09-07: qty 90, 90d supply, fill #0
  Filled 2023-12-07: qty 90, 90d supply, fill #1

## 2023-09-08 ENCOUNTER — Other Ambulatory Visit (HOSPITAL_COMMUNITY): Payer: Self-pay

## 2023-09-08 DIAGNOSIS — R0989 Other specified symptoms and signs involving the circulatory and respiratory systems: Secondary | ICD-10-CM | POA: Diagnosis not present

## 2023-09-08 DIAGNOSIS — R059 Cough, unspecified: Secondary | ICD-10-CM | POA: Diagnosis not present

## 2023-09-08 DIAGNOSIS — J189 Pneumonia, unspecified organism: Secondary | ICD-10-CM | POA: Diagnosis not present

## 2023-09-25 DIAGNOSIS — I480 Paroxysmal atrial fibrillation: Secondary | ICD-10-CM | POA: Diagnosis not present

## 2023-09-25 DIAGNOSIS — I10 Essential (primary) hypertension: Secondary | ICD-10-CM | POA: Diagnosis not present

## 2023-09-25 DIAGNOSIS — I700 Atherosclerosis of aorta: Secondary | ICD-10-CM | POA: Diagnosis not present

## 2023-10-14 ENCOUNTER — Other Ambulatory Visit (HOSPITAL_COMMUNITY): Payer: Self-pay

## 2023-10-16 ENCOUNTER — Other Ambulatory Visit (HOSPITAL_COMMUNITY): Payer: Self-pay

## 2023-11-06 ENCOUNTER — Other Ambulatory Visit (HOSPITAL_COMMUNITY): Payer: Self-pay

## 2023-11-28 DIAGNOSIS — H903 Sensorineural hearing loss, bilateral: Secondary | ICD-10-CM | POA: Diagnosis not present

## 2023-12-07 ENCOUNTER — Other Ambulatory Visit: Payer: Self-pay | Admitting: Cardiovascular Disease

## 2023-12-07 ENCOUNTER — Other Ambulatory Visit (HOSPITAL_COMMUNITY): Payer: Self-pay

## 2023-12-08 ENCOUNTER — Other Ambulatory Visit (HOSPITAL_COMMUNITY): Payer: Self-pay

## 2023-12-08 MED ORDER — ROSUVASTATIN CALCIUM 10 MG PO TABS
10.0000 mg | ORAL_TABLET | Freq: Every day | ORAL | 1 refills | Status: AC
  Filled 2023-12-08 – 2023-12-22 (×2): qty 90, 90d supply, fill #0
  Filled 2024-04-24: qty 90, 90d supply, fill #1

## 2023-12-08 MED ORDER — DILTIAZEM HCL ER COATED BEADS 180 MG PO CP24
180.0000 mg | ORAL_CAPSULE | Freq: Every day | ORAL | 0 refills | Status: DC
  Filled 2023-12-08 – 2023-12-29 (×2): qty 90, 90d supply, fill #0

## 2023-12-08 MED ORDER — FUROSEMIDE 20 MG PO TABS
20.0000 mg | ORAL_TABLET | Freq: Every day | ORAL | 1 refills | Status: AC
Start: 2023-12-08 — End: ?
  Filled 2023-12-08 – 2023-12-22 (×2): qty 90, 90d supply, fill #0

## 2023-12-15 DIAGNOSIS — Z8546 Personal history of malignant neoplasm of prostate: Secondary | ICD-10-CM | POA: Diagnosis not present

## 2023-12-15 DIAGNOSIS — N401 Enlarged prostate with lower urinary tract symptoms: Secondary | ICD-10-CM | POA: Diagnosis not present

## 2023-12-15 DIAGNOSIS — N281 Cyst of kidney, acquired: Secondary | ICD-10-CM | POA: Diagnosis not present

## 2023-12-15 DIAGNOSIS — R3914 Feeling of incomplete bladder emptying: Secondary | ICD-10-CM | POA: Diagnosis not present

## 2023-12-22 ENCOUNTER — Other Ambulatory Visit (HOSPITAL_COMMUNITY): Payer: Self-pay

## 2023-12-29 ENCOUNTER — Other Ambulatory Visit (HOSPITAL_COMMUNITY): Payer: Self-pay

## 2023-12-29 ENCOUNTER — Other Ambulatory Visit: Payer: Self-pay

## 2024-01-02 ENCOUNTER — Other Ambulatory Visit (HOSPITAL_COMMUNITY): Payer: Self-pay

## 2024-01-02 DIAGNOSIS — L02212 Cutaneous abscess of back [any part, except buttock]: Secondary | ICD-10-CM | POA: Diagnosis not present

## 2024-01-02 DIAGNOSIS — L0291 Cutaneous abscess, unspecified: Secondary | ICD-10-CM | POA: Diagnosis not present

## 2024-01-02 MED ORDER — CEPHALEXIN 500 MG PO CAPS
500.0000 mg | ORAL_CAPSULE | Freq: Four times a day (QID) | ORAL | 0 refills | Status: DC
  Filled 2024-01-02: qty 20, 5d supply, fill #0

## 2024-01-22 ENCOUNTER — Other Ambulatory Visit (HOSPITAL_COMMUNITY): Payer: Self-pay

## 2024-01-22 DIAGNOSIS — B349 Viral infection, unspecified: Secondary | ICD-10-CM | POA: Diagnosis not present

## 2024-01-24 ENCOUNTER — Other Ambulatory Visit (HOSPITAL_COMMUNITY): Payer: Self-pay

## 2024-01-24 DIAGNOSIS — R5383 Other fatigue: Secondary | ICD-10-CM | POA: Diagnosis not present

## 2024-01-24 DIAGNOSIS — J40 Bronchitis, not specified as acute or chronic: Secondary | ICD-10-CM | POA: Diagnosis not present

## 2024-01-24 DIAGNOSIS — Z03818 Encounter for observation for suspected exposure to other biological agents ruled out: Secondary | ICD-10-CM | POA: Diagnosis not present

## 2024-01-24 DIAGNOSIS — R051 Acute cough: Secondary | ICD-10-CM | POA: Diagnosis not present

## 2024-01-24 MED ORDER — DOXYCYCLINE HYCLATE 100 MG PO CAPS
100.0000 mg | ORAL_CAPSULE | Freq: Two times a day (BID) | ORAL | 0 refills | Status: AC
Start: 2024-01-24 — End: ?
  Filled 2024-01-24: qty 14, 7d supply, fill #0

## 2024-01-30 ENCOUNTER — Other Ambulatory Visit (HOSPITAL_COMMUNITY): Payer: Self-pay

## 2024-01-30 DIAGNOSIS — H0279 Other degenerative disorders of eyelid and periocular area: Secondary | ICD-10-CM | POA: Diagnosis not present

## 2024-01-30 DIAGNOSIS — H57813 Brow ptosis, bilateral: Secondary | ICD-10-CM | POA: Diagnosis not present

## 2024-01-30 DIAGNOSIS — H02882 Meibomian gland dysfunction right lower eyelid: Secondary | ICD-10-CM | POA: Diagnosis not present

## 2024-01-30 MED ORDER — NEOMYCIN-POLYMYXIN-DEXAMETH 3.5-10000-0.1 OP SUSP
OPHTHALMIC | 0 refills | Status: DC
  Filled 2024-01-30: qty 5, 15d supply, fill #0

## 2024-01-31 DIAGNOSIS — I2510 Atherosclerotic heart disease of native coronary artery without angina pectoris: Secondary | ICD-10-CM | POA: Diagnosis not present

## 2024-01-31 DIAGNOSIS — I10 Essential (primary) hypertension: Secondary | ICD-10-CM | POA: Diagnosis not present

## 2024-01-31 DIAGNOSIS — I480 Paroxysmal atrial fibrillation: Secondary | ICD-10-CM | POA: Diagnosis not present

## 2024-01-31 DIAGNOSIS — I5032 Chronic diastolic (congestive) heart failure: Secondary | ICD-10-CM | POA: Diagnosis not present

## 2024-02-13 DIAGNOSIS — L720 Epidermal cyst: Secondary | ICD-10-CM | POA: Diagnosis not present

## 2024-02-13 DIAGNOSIS — L821 Other seborrheic keratosis: Secondary | ICD-10-CM | POA: Diagnosis not present

## 2024-02-13 DIAGNOSIS — L814 Other melanin hyperpigmentation: Secondary | ICD-10-CM | POA: Diagnosis not present

## 2024-02-13 DIAGNOSIS — Z23 Encounter for immunization: Secondary | ICD-10-CM | POA: Diagnosis not present

## 2024-02-13 DIAGNOSIS — D485 Neoplasm of uncertain behavior of skin: Secondary | ICD-10-CM | POA: Diagnosis not present

## 2024-02-14 ENCOUNTER — Other Ambulatory Visit: Payer: Self-pay | Admitting: Urology

## 2024-02-14 ENCOUNTER — Other Ambulatory Visit (HOSPITAL_COMMUNITY): Payer: Self-pay

## 2024-02-15 ENCOUNTER — Other Ambulatory Visit (HOSPITAL_COMMUNITY): Payer: Self-pay

## 2024-02-17 ENCOUNTER — Other Ambulatory Visit (HOSPITAL_COMMUNITY): Payer: Self-pay

## 2024-02-19 ENCOUNTER — Other Ambulatory Visit (HOSPITAL_COMMUNITY): Payer: Self-pay

## 2024-02-19 MED ORDER — TAMSULOSIN HCL 0.4 MG PO CAPS
0.4000 mg | ORAL_CAPSULE | Freq: Every day | ORAL | 1 refills | Status: AC
  Filled 2024-02-19: qty 90, 90d supply, fill #0
  Filled 2024-05-20: qty 90, 90d supply, fill #1

## 2024-02-28 ENCOUNTER — Other Ambulatory Visit (HOSPITAL_COMMUNITY): Payer: Self-pay

## 2024-03-02 ENCOUNTER — Other Ambulatory Visit (HOSPITAL_COMMUNITY): Payer: Self-pay

## 2024-03-08 ENCOUNTER — Other Ambulatory Visit (HOSPITAL_COMMUNITY): Payer: Self-pay

## 2024-03-11 ENCOUNTER — Telehealth: Payer: Self-pay | Admitting: Pharmacy Technician

## 2024-03-11 ENCOUNTER — Other Ambulatory Visit (HOSPITAL_COMMUNITY): Payer: Self-pay

## 2024-03-11 NOTE — Telephone Encounter (Signed)
    Patient does not have insurance   I called and a male answered and she said she got the application from [employer] for renewal. She said she will fill out and mail back to [facility]

## 2024-03-18 ENCOUNTER — Other Ambulatory Visit (HOSPITAL_COMMUNITY): Payer: Self-pay

## 2024-03-20 ENCOUNTER — Other Ambulatory Visit (HOSPITAL_COMMUNITY): Payer: Self-pay

## 2024-03-20 MED ORDER — NEOMYCIN-POLYMYXIN-DEXAMETH 3.5-10000-0.1 OP SUSP
OPHTHALMIC | 1 refills | Status: AC
  Filled 2024-03-20: qty 5, 14d supply, fill #0

## 2024-03-20 NOTE — Telephone Encounter (Signed)
 I spoke to the daughter and explained that I called [employer] and they said if they have just have a/b insurance but no part d they get a credit to go towards that 3% that has to be met for 2026. if they have part d, they do not get that credit. if they have any type of medicare, they will have to of met the 3% in 2026 to get approved for 2026. if they have no insurance at all - no a/b, etc, they do not have to meet that 3%. she said she couldnt tell me the credit amount until the individual is processed. the apps for 2026 will be processed on or after 12/15. for hoh for 1 they can not exceed 46,950 a year, for hoh of 2 can not exceed 63,450 a year. she said all the applications that have been sent in for renewal for 2026 will be denied on 12/15 if they have any type of medicare b/c of that 3%. she said come 2026, all the prescriptions they get on cash price or goodrx amounts can be used towards that 3%. Eliquis  is 600 on goodrx at [facility]. dabigatran is 56.66 on goodrx at [facility]. xarelto  20mg  is 592 at [facility] on goodrx, only the generic xarelto  is available as 2.5mg  on goodrx for 111 at [facility]. warfarin is 4.00 at [facility] and 5.00 at [facility]. The patient has A [employer] Advantage HMO plan is a type of Medicare Part C plan and he has part b. He makes -1649 a month from ss -19,788 a year -hoh of 1 so has to pay 593.64 to get approved thru [employer]. I provided the daughter with the shiip numbers for [place] for her to call since he should qualify. She will call them and call me back. If he is not approved for shiip, then I will send to the doctor to see if he can be switched to pradaxa. He does not want to go on warfarin.

## 2024-03-23 ENCOUNTER — Other Ambulatory Visit (HOSPITAL_COMMUNITY): Payer: Self-pay

## 2024-03-25 ENCOUNTER — Other Ambulatory Visit (HOSPITAL_COMMUNITY): Payer: Self-pay

## 2024-03-25 DIAGNOSIS — M79672 Pain in left foot: Secondary | ICD-10-CM | POA: Diagnosis not present

## 2024-03-25 MED ORDER — METHYLPREDNISOLONE 4 MG PO TBPK
ORAL_TABLET | ORAL | 0 refills | Status: AC
  Filled 2024-03-25: qty 21, 6d supply, fill #0

## 2024-03-25 MED ORDER — PANTOPRAZOLE SODIUM 40 MG PO TBEC
40.0000 mg | DELAYED_RELEASE_TABLET | Freq: Every day | ORAL | 1 refills | Status: AC
  Filled 2024-03-25: qty 90, 90d supply, fill #0

## 2024-03-27 NOTE — Telephone Encounter (Signed)
 Daugther called today and she will call shiip today

## 2024-03-28 ENCOUNTER — Other Ambulatory Visit (HOSPITAL_COMMUNITY): Payer: Self-pay

## 2024-03-28 DIAGNOSIS — D485 Neoplasm of uncertain behavior of skin: Secondary | ICD-10-CM | POA: Diagnosis not present

## 2024-03-28 DIAGNOSIS — I872 Venous insufficiency (chronic) (peripheral): Secondary | ICD-10-CM | POA: Diagnosis not present

## 2024-03-28 DIAGNOSIS — D1801 Hemangioma of skin and subcutaneous tissue: Secondary | ICD-10-CM | POA: Diagnosis not present

## 2024-03-28 DIAGNOSIS — L570 Actinic keratosis: Secondary | ICD-10-CM | POA: Diagnosis not present

## 2024-03-28 DIAGNOSIS — L821 Other seborrheic keratosis: Secondary | ICD-10-CM | POA: Diagnosis not present

## 2024-03-28 DIAGNOSIS — L814 Other melanin hyperpigmentation: Secondary | ICD-10-CM | POA: Diagnosis not present

## 2024-03-28 MED ORDER — TRIAMCINOLONE ACETONIDE 0.1 % EX CREA
TOPICAL_CREAM | Freq: Two times a day (BID) | CUTANEOUS | 3 refills | Status: AC
  Filled 2024-03-28: qty 80, 14d supply, fill #0

## 2024-03-29 ENCOUNTER — Inpatient Hospital Stay (HOSPITAL_COMMUNITY)
Admission: EM | Admit: 2024-03-29 | Discharge: 2024-04-04 | Disposition: A | Source: Home / Self Care | Attending: Internal Medicine | Admitting: Internal Medicine

## 2024-03-29 ENCOUNTER — Other Ambulatory Visit: Payer: Self-pay

## 2024-03-29 ENCOUNTER — Emergency Department (HOSPITAL_COMMUNITY)

## 2024-03-29 ENCOUNTER — Encounter (HOSPITAL_COMMUNITY): Payer: Self-pay | Admitting: Hospitalist

## 2024-03-29 DIAGNOSIS — I77810 Thoracic aortic ectasia: Secondary | ICD-10-CM | POA: Diagnosis present

## 2024-03-29 DIAGNOSIS — E785 Hyperlipidemia, unspecified: Secondary | ICD-10-CM | POA: Diagnosis present

## 2024-03-29 DIAGNOSIS — D696 Thrombocytopenia, unspecified: Secondary | ICD-10-CM | POA: Diagnosis present

## 2024-03-29 DIAGNOSIS — N1832 Chronic kidney disease, stage 3b: Secondary | ICD-10-CM | POA: Diagnosis present

## 2024-03-29 DIAGNOSIS — Z7189 Other specified counseling: Secondary | ICD-10-CM | POA: Diagnosis not present

## 2024-03-29 DIAGNOSIS — H9190 Unspecified hearing loss, unspecified ear: Secondary | ICD-10-CM | POA: Diagnosis present

## 2024-03-29 DIAGNOSIS — I080 Rheumatic disorders of both mitral and aortic valves: Secondary | ICD-10-CM | POA: Diagnosis present

## 2024-03-29 DIAGNOSIS — G4700 Insomnia, unspecified: Secondary | ICD-10-CM | POA: Diagnosis present

## 2024-03-29 DIAGNOSIS — R079 Chest pain, unspecified: Secondary | ICD-10-CM | POA: Diagnosis not present

## 2024-03-29 DIAGNOSIS — J929 Pleural plaque without asbestos: Secondary | ICD-10-CM | POA: Diagnosis not present

## 2024-03-29 DIAGNOSIS — Z8546 Personal history of malignant neoplasm of prostate: Secondary | ICD-10-CM | POA: Diagnosis not present

## 2024-03-29 DIAGNOSIS — I472 Ventricular tachycardia, unspecified: Secondary | ICD-10-CM | POA: Diagnosis not present

## 2024-03-29 DIAGNOSIS — I130 Hypertensive heart and chronic kidney disease with heart failure and stage 1 through stage 4 chronic kidney disease, or unspecified chronic kidney disease: Principal | ICD-10-CM | POA: Diagnosis present

## 2024-03-29 DIAGNOSIS — Z8249 Family history of ischemic heart disease and other diseases of the circulatory system: Secondary | ICD-10-CM

## 2024-03-29 DIAGNOSIS — I2510 Atherosclerotic heart disease of native coronary artery without angina pectoris: Secondary | ICD-10-CM | POA: Diagnosis present

## 2024-03-29 DIAGNOSIS — I428 Other cardiomyopathies: Secondary | ICD-10-CM | POA: Diagnosis present

## 2024-03-29 DIAGNOSIS — I4510 Unspecified right bundle-branch block: Secondary | ICD-10-CM | POA: Diagnosis present

## 2024-03-29 DIAGNOSIS — Z79899 Other long term (current) drug therapy: Secondary | ICD-10-CM

## 2024-03-29 DIAGNOSIS — I5043 Acute on chronic combined systolic (congestive) and diastolic (congestive) heart failure: Secondary | ICD-10-CM | POA: Diagnosis present

## 2024-03-29 DIAGNOSIS — N400 Enlarged prostate without lower urinary tract symptoms: Secondary | ICD-10-CM | POA: Diagnosis present

## 2024-03-29 DIAGNOSIS — E43 Unspecified severe protein-calorie malnutrition: Secondary | ICD-10-CM | POA: Diagnosis present

## 2024-03-29 DIAGNOSIS — Z7901 Long term (current) use of anticoagulants: Secondary | ICD-10-CM | POA: Diagnosis not present

## 2024-03-29 DIAGNOSIS — Z515 Encounter for palliative care: Secondary | ICD-10-CM

## 2024-03-29 DIAGNOSIS — J9811 Atelectasis: Secondary | ICD-10-CM | POA: Diagnosis present

## 2024-03-29 DIAGNOSIS — Z8673 Personal history of transient ischemic attack (TIA), and cerebral infarction without residual deficits: Secondary | ICD-10-CM

## 2024-03-29 DIAGNOSIS — R918 Other nonspecific abnormal finding of lung field: Secondary | ICD-10-CM | POA: Diagnosis not present

## 2024-03-29 DIAGNOSIS — J90 Pleural effusion, not elsewhere classified: Secondary | ICD-10-CM | POA: Diagnosis not present

## 2024-03-29 DIAGNOSIS — Z66 Do not resuscitate: Secondary | ICD-10-CM | POA: Diagnosis present

## 2024-03-29 DIAGNOSIS — I480 Paroxysmal atrial fibrillation: Secondary | ICD-10-CM | POA: Diagnosis present

## 2024-03-29 DIAGNOSIS — I499 Cardiac arrhythmia, unspecified: Secondary | ICD-10-CM

## 2024-03-29 DIAGNOSIS — I959 Hypotension, unspecified: Secondary | ICD-10-CM | POA: Diagnosis present

## 2024-03-29 DIAGNOSIS — Z8042 Family history of malignant neoplasm of prostate: Secondary | ICD-10-CM

## 2024-03-29 DIAGNOSIS — R000 Tachycardia, unspecified: Secondary | ICD-10-CM | POA: Diagnosis not present

## 2024-03-29 DIAGNOSIS — Z681 Body mass index (BMI) 19 or less, adult: Secondary | ICD-10-CM | POA: Diagnosis not present

## 2024-03-29 DIAGNOSIS — E8721 Acute metabolic acidosis: Secondary | ICD-10-CM | POA: Diagnosis present

## 2024-03-29 DIAGNOSIS — I5021 Acute systolic (congestive) heart failure: Secondary | ICD-10-CM | POA: Diagnosis not present

## 2024-03-29 DIAGNOSIS — I4891 Unspecified atrial fibrillation: Secondary | ICD-10-CM | POA: Diagnosis not present

## 2024-03-29 DIAGNOSIS — R0902 Hypoxemia: Secondary | ICD-10-CM | POA: Diagnosis not present

## 2024-03-29 LAB — COMPREHENSIVE METABOLIC PANEL WITH GFR
ALT: 33 U/L (ref 0–44)
AST: 24 U/L (ref 15–41)
Albumin: 3 g/dL — ABNORMAL LOW (ref 3.5–5.0)
Alkaline Phosphatase: 53 U/L (ref 38–126)
Anion gap: 10 (ref 5–15)
BUN: 31 mg/dL — ABNORMAL HIGH (ref 8–23)
CO2: 19 mmol/L — ABNORMAL LOW (ref 22–32)
Calcium: 7 mg/dL — ABNORMAL LOW (ref 8.9–10.3)
Chloride: 112 mmol/L — ABNORMAL HIGH (ref 98–111)
Creatinine, Ser: 1.3 mg/dL — ABNORMAL HIGH (ref 0.61–1.24)
GFR, Estimated: 50 mL/min — ABNORMAL LOW (ref >60)
Glucose, Bld: 104 mg/dL — ABNORMAL HIGH (ref 70–99)
Potassium: 3.2 mmol/L — ABNORMAL LOW (ref 3.5–5.1)
Sodium: 141 mmol/L (ref 135–145)
Total Bilirubin: 1.3 mg/dL — ABNORMAL HIGH (ref 0.0–1.2)
Total Protein: 5 g/dL — ABNORMAL LOW (ref 6.5–8.1)

## 2024-03-29 LAB — CBC WITH DIFFERENTIAL/PLATELET
Abs Immature Granulocytes: 0.03 K/uL (ref 0.00–0.07)
Basophils Absolute: 0 K/uL (ref 0.0–0.1)
Basophils Relative: 0 %
Eosinophils Absolute: 0 K/uL (ref 0.0–0.5)
Eosinophils Relative: 0 %
HCT: 40.9 % (ref 39.0–52.0)
Hemoglobin: 13.5 g/dL (ref 13.0–17.0)
Immature Granulocytes: 1 %
Lymphocytes Relative: 15 %
Lymphs Abs: 0.7 K/uL (ref 0.7–4.0)
MCH: 32.5 pg (ref 26.0–34.0)
MCHC: 33 g/dL (ref 30.0–36.0)
MCV: 98.3 fL (ref 80.0–100.0)
Monocytes Absolute: 0.4 K/uL (ref 0.1–1.0)
Monocytes Relative: 10 %
Neutro Abs: 3.5 K/uL (ref 1.7–7.7)
Neutrophils Relative %: 74 %
Platelets: 116 K/uL — ABNORMAL LOW (ref 150–400)
RBC: 4.16 MIL/uL — ABNORMAL LOW (ref 4.22–5.81)
RDW: 14.4 % (ref 11.5–15.5)
WBC: 4.7 K/uL (ref 4.0–10.5)
nRBC: 0 % (ref 0.0–0.2)

## 2024-03-29 LAB — I-STAT CHEM 8, ED
BUN: 35 mg/dL — ABNORMAL HIGH (ref 8–23)
Calcium, Ion: 1.07 mmol/L — ABNORMAL LOW (ref 1.15–1.40)
Chloride: 108 mmol/L (ref 98–111)
Creatinine, Ser: 1.6 mg/dL — ABNORMAL HIGH (ref 0.61–1.24)
Glucose, Bld: 112 mg/dL — ABNORMAL HIGH (ref 70–99)
HCT: 40 % (ref 39.0–52.0)
Hemoglobin: 13.6 g/dL (ref 13.0–17.0)
Potassium: 3.7 mmol/L (ref 3.5–5.1)
Sodium: 143 mmol/L (ref 135–145)
TCO2: 21 mmol/L — ABNORMAL LOW (ref 22–32)

## 2024-03-29 LAB — URINALYSIS, W/ REFLEX TO CULTURE (INFECTION SUSPECTED)
Bilirubin Urine: NEGATIVE
Glucose, UA: NEGATIVE mg/dL
Hgb urine dipstick: NEGATIVE
Ketones, ur: NEGATIVE mg/dL
Nitrite: POSITIVE — AB
Protein, ur: NEGATIVE mg/dL
Specific Gravity, Urine: 1.008 (ref 1.005–1.030)
pH: 6 (ref 5.0–8.0)

## 2024-03-29 LAB — I-STAT CG4 LACTIC ACID, ED
Lactic Acid, Venous: 2 mmol/L (ref 0.5–1.9)
Lactic Acid, Venous: 1.4 mmol/L (ref 0.5–1.9)

## 2024-03-29 LAB — TROPONIN I (HIGH SENSITIVITY)
Troponin I (High Sensitivity): 75 ng/L — ABNORMAL HIGH (ref <18)
Troponin I (High Sensitivity): 445 ng/L (ref <18)
Troponin I (High Sensitivity): 906 ng/L (ref <18)

## 2024-03-29 LAB — TSH: TSH: 2.481 u[IU]/mL (ref 0.350–4.500)

## 2024-03-29 LAB — BRAIN NATRIURETIC PEPTIDE: B Natriuretic Peptide: 1018 pg/mL — ABNORMAL HIGH (ref 0.0–100.0)

## 2024-03-29 LAB — MAGNESIUM: Magnesium: 1.8 mg/dL (ref 1.7–2.4)

## 2024-03-29 MED ORDER — SODIUM CHLORIDE 0.9 % IV BOLUS
500.0000 mL | Freq: Once | INTRAVENOUS | Status: AC
  Administered 2024-03-29: 500 mL via INTRAVENOUS

## 2024-03-29 MED ORDER — AMIODARONE HCL IN DEXTROSE 360-4.14 MG/200ML-% IV SOLN
30.0000 mg/h | INTRAVENOUS | Status: AC
  Administered 2024-03-29 – 2024-04-02 (×8): 30 mg/h via INTRAVENOUS
  Filled 2024-03-29 (×5): qty 200
  Filled 2024-03-29: qty 400
  Filled 2024-03-29: qty 200

## 2024-03-29 MED ORDER — ACETAMINOPHEN 325 MG PO TABS
650.0000 mg | ORAL_TABLET | Freq: Four times a day (QID) | ORAL | Status: DC | PRN
  Administered 2024-03-29 – 2024-04-03 (×8): 650 mg via ORAL
  Filled 2024-03-29 (×8): qty 2

## 2024-03-29 MED ORDER — APIXABAN 2.5 MG PO TABS
2.5000 mg | ORAL_TABLET | Freq: Two times a day (BID) | ORAL | Status: DC
  Administered 2024-03-29 – 2024-04-04 (×12): 2.5 mg via ORAL
  Filled 2024-03-29 (×12): qty 1

## 2024-03-29 MED ORDER — POTASSIUM CHLORIDE CRYS ER 20 MEQ PO TBCR
20.0000 meq | EXTENDED_RELEASE_TABLET | Freq: Two times a day (BID) | ORAL | Status: AC
  Administered 2024-03-29 – 2024-03-30 (×2): 20 meq via ORAL
  Filled 2024-03-29 (×2): qty 1

## 2024-03-29 MED ORDER — ENSURE PLUS HIGH PROTEIN PO LIQD
237.0000 mL | Freq: Three times a day (TID) | ORAL | Status: DC
  Administered 2024-03-29 – 2024-04-03 (×11): 237 mL via ORAL
  Filled 2024-03-29: qty 237

## 2024-03-29 MED ORDER — ROSUVASTATIN CALCIUM 5 MG PO TABS
10.0000 mg | ORAL_TABLET | Freq: Every day | ORAL | Status: DC
  Administered 2024-03-29 – 2024-04-04 (×7): 10 mg via ORAL
  Filled 2024-03-29 (×7): qty 2

## 2024-03-29 MED ORDER — FUROSEMIDE 10 MG/ML IJ SOLN
40.0000 mg | Freq: Two times a day (BID) | INTRAMUSCULAR | Status: DC
  Administered 2024-03-29 – 2024-03-31 (×4): 40 mg via INTRAVENOUS
  Filled 2024-03-29 (×4): qty 4

## 2024-03-29 MED ORDER — TAMSULOSIN HCL 0.4 MG PO CAPS
0.4000 mg | ORAL_CAPSULE | Freq: Every day | ORAL | Status: DC
  Administered 2024-03-29 – 2024-04-04 (×7): 0.4 mg via ORAL
  Filled 2024-03-29 (×7): qty 1

## 2024-03-29 MED ORDER — AMIODARONE HCL IN DEXTROSE 360-4.14 MG/200ML-% IV SOLN
60.0000 mg/h | INTRAVENOUS | Status: AC
  Administered 2024-03-29: 60 mg/h via INTRAVENOUS
  Filled 2024-03-29: qty 200

## 2024-03-29 MED ORDER — SODIUM CHLORIDE 0.9% FLUSH
3.0000 mL | Freq: Two times a day (BID) | INTRAVENOUS | Status: DC
  Administered 2024-03-29 – 2024-04-03 (×12): 3 mL via INTRAVENOUS

## 2024-03-29 MED ORDER — ACETAMINOPHEN 650 MG RE SUPP: 650.0000 mg | Freq: Four times a day (QID) | RECTAL | Status: DC | PRN

## 2024-03-29 MED ORDER — POTASSIUM CHLORIDE CRYS ER 20 MEQ PO TBCR
40.0000 meq | EXTENDED_RELEASE_TABLET | Freq: Once | ORAL | Status: AC
  Administered 2024-03-29: 40 meq via ORAL
  Filled 2024-03-29: qty 2

## 2024-03-29 MED ORDER — FINASTERIDE 5 MG PO TABS
5.0000 mg | ORAL_TABLET | Freq: Every day | ORAL | Status: DC
  Administered 2024-03-29 – 2024-04-04 (×7): 5 mg via ORAL
  Filled 2024-03-29 (×7): qty 1

## 2024-03-29 NOTE — H&P (Addendum)
 History and Physical    Patient: Richard Reed FMW:[id number] DOB: 02/29/1926 DOA: 03/29/2024 DOS: the patient was seen and examined on 03/29/2024 PCP: Marvene Prentice SAUNDERS, FNP  Patient coming from: ALF/[facility]  Chief Complaint:  Chief Complaint  Patient presents with   Chest Pain   HPI: Richard Reed is a 89 y.o. male with medical history significant of PAF on low-dose Eliquis , history of prostate cancer, CAD involving the RCA medical therapy only with cardiac cath in 2012, chronic kidney disease stage III known vertebrobasilar stenosis, hypertension, dyslipidemia, BPH on Flomax ,HFrEF with a component of diastolic dysfunction, mild to moderate MR, moderate AR, moderate AS.  Patient is very hard of hearing.  Patient was in his usual state of health.  He reports that he went to bed feeling okay.  He woke up at about 4 AM to go to the restroom and noticed his pajamas were wet.  At that point he attempted to go back to sleep but was having difficulty.  Eventually he developed abrupt onset of midsternal chest pain that radiated to the back this was associated with severe nausea.  His sweatiness returned and he described his eyes being dimmed.  He felt like he needed to catch his breath.  He also reports at least 1 week of some tachypalpitations and increasing edema in his lower extremities.  Because of the symptoms EMS was called to the house.  Initially the patient was found laying on the floor and was found to be in SVT but he became unresponsive with very weak carotid pulse and rhythm had changed to ventricular tachycardia.  He was defibrillated at 360 J x 1 in the field.  Upon presentation to the ED he was in A-fib with RVR with ventricular responses in the 130-140 range.  Of note patient has low baseline systolic blood pressure in the 90s.  Because of the lower pressure and the increased heart rate there were some concerns patient could be dehydrated so he was given a 500 cc fluid challenge  by the ED.  Because his blood pressure was suboptimal EDP deferred administration of calcium  channel blockers.  Chest x-ray did demonstrate small left pleural effusion with overlying left lower lobe consolidation, atelectasis or infiltrate.  Patient and family denied any upper respiratory symptoms including fevers chills or cough.  Cardiology has been consulted and will be seeing the patient.  Hospitalist service has been asked to evaluate the patient for admission.  All of the above history has been confirmed per my evaluation of the patient.  During my examination cardiologist presented to the room.  We reviewed his out-of-hospital telemetry strips.  We examined the patient together.  Cardiologist recommended initiation of an amiodarone  infusion which the EDP has started, Lasix  has been recommended and Eliquis  has been resumed.  Given suboptimal blood pressure readings home Lopressor  and Cardizem  have not been resumed at this time.  Echocardiogram has been deferred based on ongoing tachycardia.  At this time patient wishes to remain a full code.   Review of Systems: As mentioned in the history of present illness. All other systems reviewed and are negative. Past Medical History:  Diagnosis Date   Angina    Aortic valve insufficiency, moderate    a. 02/2016 Echo: mild AS, mild to mod AI.   Arthritis    Arthritis pain    CAD (coronary artery disease)    a. 09/2010 Cath: RCA 40-60 ost, otw nl cors;  b. 03/2016 Lexiscan  MV: EF 46%, no  ischemia, low risk.   Cancer [facility])    Carotid disease, bilateral    a. 02/2016 Carotid U/S: 1-39% bilat ICA plaquing.   Cerebral aneurysm    a. 02/2016 MRA: fusiform bilobed aneurysm @ R MCA bifurcation.   Cerebrovascular disease    a. 02/2016 MRA: long segment narrowing of basilar artery, moderate bilat posterior inferior cerebellar artery dzs.   Chronic combined systolic and diastolic CHF (congestive heart failure) (HCC)    a. 02/2016 Admitted to hospital in  [place]  w/ CHF;  b. 02/2016 Echo ([facility]): EF 45-50%, diff HK, gr1DD, mild AS, mild to mod AI, mildly dil Ao root, PASP .   CKD (chronic kidney disease), stage III (HCC)    Dysrhythmia    Essential hypertension    Hard of hearing    Hemiparesis and alteration of sensations as late effects of stroke (HCC) 04/29/2015   Hiatal hernia    History of kidney stones    HOH (hard of hearing)    Hyperlipidemia    Kidney stones    Moderate aortic insufficiency    NICM (nonischemic cardiomyopathy) (HCC)    a. 02/2016 Echo: EF 45-50%, diff HK;  b. 03/2016 Lexiscan  MV: No ischemia, EF 46%.   NSVT (nonsustained ventricular tachycardia) (HCC)    a. 03/2016 noted on event monitor.   PAF (paroxysmal atrial fibrillation) (HCC)    a. 2012 - noted in setting of E coli bacteremia and septic shock.   Pneumonia 03/23/2011   first time   Prostate cancer (HCC)    Sepsis due to Escherichia coli (HCC) 03/24/11   Syncope    a. 02/2016 presumed to be 2/2 hypotension/dehydration and basilar artery dzs noted on MRA.   TIA (transient ischemic attack)    Vertebrobasilar insufficiency 04/28/2016   Visual disturbance 03/19/2014   Past Surgical History:  Procedure Laterality Date   CARDIAC CATHETERIZATION  09/2010   non obstructive CAD, 40-60% ostial RCA stenosis without dampening    CATARACT EXTRACTION Bilateral    cerebral angiography     CHOLECYSTECTOMY  04/15/2011   Procedure: LAPAROSCOPIC CHOLECYSTECTOMY WITH INTRAOPERATIVE CHOLANGIOGRAM;  Surgeon: Lynwood MALVA Kimble DOUGLAS, MD;  Location: [facility] OR;  Service: General;  Laterality: N/A;   CYSTOSCOPY WITH STENT PLACEMENT Bilateral 04/07/2022   Procedure: CYSTOSCOPY WITH BILATERAL STENT PLACEMENT;  Surgeon: Nieves Cough, MD;  Location: [facility] ORS;  Service: Urology;  Laterality: Bilateral;  30 MINS FRO CASE   CYSTOSCOPY/URETEROSCOPY/HOLMIUM LASER/STENT PLACEMENT Bilateral 05/03/2022   Procedure: CYSTOSCOPY BILATERAL URETEROSCOPY/HOLMIUM LASER/STENT PLACEMENT;  Surgeon:  Nieves Cough, MD;  Location: [facility]S;  Service: Urology;  Laterality: Bilateral;  2 HRS FOR CASE   EXTRACORPOREAL SHOCK WAVE LITHOTRIPSY Left 12/12/2016   Procedure: LEFT EXTRACORPOREAL SHOCK WAVE LITHOTRIPSY (ESWL);  Surgeon: Cam Morene ORN, MD;  Location: [facility]S;  Service: Urology;  Laterality: Left;   IR ANGIO INTRA EXTRACRAN SEL COM CAROTID INNOMINATE BILAT MOD SED  03/21/2019   IR ANGIO VERTEBRAL SEL SUBCLAVIAN INNOMINATE BILAT MOD SED  03/21/2019   IR ANGIOGRAM SELECTIVE EACH ADDITIONAL VESSEL  04/20/2022   IR ANGIOGRAM SELECTIVE EACH ADDITIONAL VESSEL  04/20/2022   IR ANGIOGRAM VISCERAL SELECTIVE  04/20/2022   IR EMBO ART  VEN HEMORR LYMPH EXTRAV  INC GUIDE ROADMAPPING  04/20/2022   IR GENERIC HISTORICAL  03/17/2016   IR ANGIO VERTEBRAL SEL SUBCLAVIAN INNOMINATE BILAT MOD SED 03/17/2016 Thyra Nash, MD [facility]   IR GENERIC HISTORICAL  03/17/2016   IR ANGIO INTRA EXTRACRAN SEL COM CAROTID INNOMINATE BILAT MOD SED 03/17/2016 Thyra  Deveshwar, MD [facility]   IR US  GUIDE VASC ACCESS RIGHT  04/20/2022   PROSTATE SURGERY     approx 10 years ago, seed implant radiation   TONSILLECTOMY     Social History:  reports that he has never smoked. He has never used smokeless tobacco. He reports that he does not drink alcohol  and does not use drugs.  Allergies[1]  Family History  Problem Relation Age of Onset   Heart failure Mother    Heart disease Mother    Heart disease Father    Cancer Brother        prostate    Prior to Admission medications  Medication Sig Start Date End Date Taking? Authorizing Provider  apixaban  (ELIQUIS ) 2.5 MG TABS tablet Take 1 tablet (2.5 mg total) by mouth 2 (two) times daily. 05/26/23   Court Dorn PARAS, MD  B Complex-C (SUPER B COMPLEX PO) Take 1 tablet by mouth daily.    [provider]  cephALEXin  (KEFLEX ) 500 MG capsule Take 1 capsule (500 mg total) by mouth every 6 (six) hours. 01/02/24     diltiazem  (CARDIZEM  CD) 180 MG 24 hr capsule  Take 1 capsule (180 mg total) by mouth daily. 12/08/23   Court Dorn PARAS, MD  doxycycline  (VIBRAMYCIN ) 100 MG capsule Take 1 capsule (100 mg total) by mouth 2 (two) times daily. 01/24/24     erythromycin  ophthalmic ointment APPLY INTO THE LOWER CONJUNCTIVAL SAC IN THE RIGHT EYE 3 TIMES PER DAY FOR 10 DAYS. 02/13/23     erythromycin  ophthalmic ointment APPLY INTO THE LOWER CONJUNCTIVAL SAC IN RIGHT EYE BY OPHTHALMIC ROUTE 2-3 TIMES PER DAY Patient not taking: Reported on 07/26/2023 06/19/23     feeding supplement, ENSURE ENLIVE, (ENSURE ENLIVE) LIQD Take 237 mLs by mouth 2 (two) times daily between meals. Patient taking differently: Take 237 mLs by mouth daily. 03/02/16   Vann, Jessica U, DO  finasteride  (PROSCAR ) 5 MG tablet Take 1 tablet (5 mg total) by mouth daily. 04/04/23     furosemide  (LASIX ) 20 MG tablet Take 1 tablet (20 mg total) by mouth daily. 12/08/23   Court Dorn PARAS, MD  methylPREDNISolone  (MEDROL  DOSEPAK) 4 MG TBPK tablet take as directed on package 03/25/24     metoprolol  tartrate (LOPRESSOR ) 25 MG tablet Take 1 tablet (25 mg total) by mouth 2 (two) times daily. 08/04/23   Court Dorn PARAS, MD  molnupiravir  EUA (LAGEVRIO ) 200 MG CAPS capsule Take 4 capsules (800 mg total) by mouth every 12 (twelve) hours for 5 days. Patient not taking: Reported on 07/26/2023 07/19/23     Multiple Vitamins-Minerals (PRESERVISION AREDS 2) CAPS Take 1 capsule by mouth in the morning and at bedtime.    [provider]  neomycin -polymyxin b -dexamethasone  (MAXITROL ) 3.5-10000-0.1 SUSP Place 1 drop into the right eye every 4 (four) hours for 7 days, THEN 1 drop every 8 (eight) hours for 7 days then stop. 03/20/24 04/04/24    oseltamivir  (TAMIFLU ) 30 MG capsule Take 2 capsules by mouth with first dose, then 1 capsule twice a day 5 days Patient not taking: Reported on 07/26/2023 06/22/23     pantoprazole  (PROTONIX ) 40 MG tablet Take 1 tablet (40 mg total) by mouth daily with a meal. 03/25/24     Propylene Glycol  (SYSTANE BALANCE OP) Place 1 drop into both eyes 2 (two) times daily as needed (for dryness).    [provider]  rosuvastatin  (CRESTOR ) 10 MG tablet Take 1 tablet (10 mg total) by mouth daily.  12/08/23 12/07/24  Court Dorn PARAS, MD  tamsulosin  (FLOMAX ) 0.4 MG CAPS capsule Take 1 capsule (0.4 mg total) by mouth daily. 02/19/24     triamcinolone  cream (KENALOG) 0.1 % Apply topically 2 (two) times daily to affected area on leg for up to 2 weeks as needed for itching and flares. 03/28/24       Physical Exam: Vitals:   03/29/24 1034 03/29/24 1035 03/29/24 1100 03/29/24 1130  BP: 99/73  93/81 (!) 87/73  Pulse:    (!) 55  Resp:  18 (!) 21 20  Temp:      TempSrc:      SpO2:    99%  Weight:      Height:       Constitutional: NAD, calm, comfortable ENMT: Mucous membranes are moist.  Lips appear somewhat bluish in discoloration, posterior pharynx clear of any exudate or lesions.Normal dentition.  Respiratory: A few fine bilateral crackles but lungs notably diminished throughout all fields, normal respiratory effort. No accessory muscle use.  Currently on room air at rest Cardiovascular: Irregular rate and in atrial fibrillation, no murmurs / rubs / gallops.  2+ spongy bilateral lower extremity edema. 2+ pedal pulses.  Positive bilateral JVD. Abdomen: no tenderness, no masses palpated. No obvious hepatosplenomegaly. Bowel sounds positive.  Musculoskeletal: no clubbing / cyanosis. No joint deformity upper and lower extremities. Good ROM, no contractures. Normal muscle tone.  Skin: no rashes, lesions, ulcers. No induration Neurologic: CN 2-12 grossly intact except for known significant bilateral hearing loss.  Patient wears bilateral hearing aid. Sensation intact, Strength 4/5 x all 4 extremities.  Psychiatric: Normal judgment and insight. Alert and oriented x 3. Normal mood.     Data Reviewed:  Sodium 141, potassium 3.2 with repeat 3.7, chloride 112, CO2 19, anion gap 10, glucose 104, BUN  31, creatinine 1.3, calcium  7, albumin 3.0, LFTs normal except for mild elevation in total bilirubin of 1.3,  BNP 1018, high-sensitivity troponin 75  Lactic acid 2.0 with follow-up 1.4  WBC 4700 with normal differential, hemoglobin 13.5, platelets 116,000  TSH 2.48  Chest x-ray as above  EKG: Atrial fibrillation with underlying right bundle branch block with QTc 523 ms  Assessment and Plan: Atrial fibrillation with RVR Ventricular tachycardia Appreciate assistance of cardiology team Currently patient is maintaining atrial fibrillation rhythm with heart rates in the 130-140 range therefore cardiology has initiated amiodarone  infusion Patient also had episodes of ventricular tachycardia in the field requiring defibrillation 360 J x 1.  He also has wide QRS secondary to underlying right bundle branch block with associated prolonged QTc 523 ms-avoid QT prolongation medications such as Cipro  and Zofran  Preadmission he was on calcium  channel blocker and beta-blocker and given suboptimal blood pressure readings cardiology has recommended to hold this medication Continue home Eliquis  Keep potassium greater than than or equal to 4.0 by utilizing potassium supplementation especially in the context of requirement for loop diuretic Magnesium  okay at this juncture at 1.8 Presented with poor perfusion as evidenced by elevated troponin and lactic acid.  Lactic acid has currently normalized  Acute exacerbation of HFrEF Diastolic dysfunction Known mild to moderate MR, moderate AR, and moderate AS per echo 2021 Suspect secondary to at least one week of RVR based on history given by patient and family Begin IV Lasix  40 mg twice daily with potassium supplementation-follow electrolyte Holding calcium  channel blocker and beta-blocker per recommendation of cardiology Will need to obtain echocardiogram once ventricular rates are 100 bpm or less Strict intake and output  and daily weights  CAD with RCA 40  to 60% ostial in 2012, medically managed Patient had chest pain in context of likely RVR but will need to trend enzymes given history of nonobstructive CAD; suspect elevated troponin likely secondary to demand ischemia-troponin trend: 75-445 Unclear at this juncture given his advanced age and renal function if he is a candidate for cardiac catheterization Currently chest pain-free but given suboptimal blood pressure readings with rapid heart rate we will hold off on as needed sublingual nitroglycerin at this point  Hypertension Current blood pressure readings are suboptimal in the context of tachycardia so holding preadmission beta-blocker and calcium  channel blocker  CKD stage IIIa Baseline creatinine was 1.15 in January 2024 and in March 2025 was 1.54 Current creatinine at baseline is 1.6 Continue to follow labs and intake and output  Thrombocytopenia Documented in March 2024 with platelets 119,000 Current platelets are 116,000 No signs of bleeding Continue to follow  Known vertebrobasilar stenosis Currently asymptomatic  History of prostate cancer/BPH Continue Flomax   HLD Continue Crestor   Protein calorie malnutrition Last known weight was 61.2 kg with a BMI of 19.9 Nutrition consultation Ensure supplementation  Physical deconditioning According to family patient will walk from his room at the [facility]F to the common area where he sits on the couch most days PT/OT evaluation once RVR resolved    Advance Care Planning:   Code Status: Full Code   VTE prophylaxis: Eliquis   Consults: Cardiology  Family Communication: Daughter at bedside  Severity of Illness: The appropriate patient status for this patient is INPATIENT. Inpatient status is judged to be reasonable and necessary in order to provide the required intensity of service to ensure the patient's safety. The patient's presenting symptoms, physical exam findings, and initial radiographic and laboratory data in the  context of their chronic comorbidities is felt to place them at high risk for further clinical deterioration. Furthermore, it is not anticipated that the patient will be medically stable for discharge from the hospital within 2 midnights of admission.   * I certify that at the point of admission it is my clinical judgment that the patient will require inpatient hospital care spanning beyond 2 midnights from the point of admission due to high intensity of service, high risk for further deterioration and high frequency of surveillance required.*  Author: Isaiah Lever, NP 03/29/2024 1:04 PM  For on call review [email or web address].      [1] No Known Allergies

## 2024-03-29 NOTE — ED Notes (Addendum)
 Called and made floor aware of PT arrival. PT transported with RN

## 2024-03-29 NOTE — ED Notes (Signed)
 Lactic results to skylor rn by at

## 2024-03-29 NOTE — ED Triage Notes (Addendum)
 Pt BIB [facility] from [facility] c/o chest pain. Pt reports 8/10 pain, diaphoresis, and nausea prior to the episode. Upon EMS arrival, pt was on the floor and found to be in SVT. Once in the ambulance, pt went into Vtach, went unresponsive, had weak carotid pulses. EMS gave a shock at 360, and pt converted to Afib RVR with PVCs and couplets. Pt has PMHx of Afib. Pt arrives A/Ox4. HR currently 150 Afib RVR.   EMS vitals: BP 107/71 HR 140 97% RA RR 18

## 2024-03-29 NOTE — ED Provider Notes (Signed)
 [address] [facility]ERGENCY DEPARTMENT AT [facility] Provider Note   CSN: [id number] Arrival date & time: 03/29/24  9057     Patient presents with: Chest Pain   Richard Reed is a 89 y.o. male.   The history is provided by the patient, the EMS personnel and medical records. No language interpreter was used.  Chest Pain Pain location:  Substernal area Pain quality: crushing and pressure   Pain radiates to:  Does not radiate Pain severity:  Moderate Onset quality:  Gradual Timing:  Constant Progression:  Resolved Chronicity:  New Relieved by:  Nothing Worsened by:  Nothing Associated symptoms: claudication, diaphoresis, fatigue, headache, nausea, palpitations, shortness of breath and syncope   Associated symptoms: no abdominal pain, no back pain, no cough, no fever, no lower extremity edema and no vomiting        Prior to Admission medications  Medication Sig Start Date End Date Taking? Authorizing Provider  apixaban  (ELIQUIS ) 2.5 MG TABS tablet Take 1 tablet (2.5 mg total) by mouth 2 (two) times daily. 05/26/23   Court Dorn PARAS, MD  B Complex-C (SUPER B COMPLEX PO) Take 1 tablet by mouth daily.    [provider]  cephALEXin  (KEFLEX ) 500 MG capsule Take 1 capsule (500 mg total) by mouth every 6 (six) hours. 01/02/24     diltiazem  (CARDIZEM  CD) 180 MG 24 hr capsule Take 1 capsule (180 mg total) by mouth daily. 12/08/23   Court Dorn PARAS, MD  doxycycline  (VIBRAMYCIN ) 100 MG capsule Take 1 capsule (100 mg total) by mouth 2 (two) times daily. 01/24/24     erythromycin  ophthalmic ointment APPLY INTO THE LOWER CONJUNCTIVAL SAC IN THE RIGHT EYE 3 TIMES PER DAY FOR 10 DAYS. 02/13/23     erythromycin  ophthalmic ointment APPLY INTO THE LOWER CONJUNCTIVAL SAC IN RIGHT EYE BY OPHTHALMIC ROUTE 2-3 TIMES PER DAY Patient not taking: Reported on 07/26/2023 06/19/23     feeding supplement, ENSURE ENLIVE, (ENSURE ENLIVE) LIQD Take 237 mLs by mouth 2 (two) times daily between  meals. Patient taking differently: Take 237 mLs by mouth daily. 03/02/16   Vann, Jessica U, DO  finasteride  (PROSCAR ) 5 MG tablet Take 1 tablet (5 mg total) by mouth daily. 04/04/23     furosemide  (LASIX ) 20 MG tablet Take 1 tablet (20 mg total) by mouth daily. 12/08/23   Court Dorn PARAS, MD  methylPREDNISolone  (MEDROL  DOSEPAK) 4 MG TBPK tablet take as directed on package 03/25/24     metoprolol  tartrate (LOPRESSOR ) 25 MG tablet Take 1 tablet (25 mg total) by mouth 2 (two) times daily. 08/04/23   Court Dorn PARAS, MD  molnupiravir  EUA (LAGEVRIO ) 200 MG CAPS capsule Take 4 capsules (800 mg total) by mouth every 12 (twelve) hours for 5 days. Patient not taking: Reported on 07/26/2023 07/19/23     Multiple Vitamins-Minerals (PRESERVISION AREDS 2) CAPS Take 1 capsule by mouth in the morning and at bedtime.    [provider]  neomycin -polymyxin b -dexamethasone  (MAXITROL ) 3.5-10000-0.1 SUSP Place 1 drop into the right eye every 4 (four) hours for 7 days, THEN 1 drop every 8 (eight) hours for 7 days then stop. 03/20/24 04/04/24    oseltamivir  (TAMIFLU ) 30 MG capsule Take 2 capsules by mouth with first dose, then 1 capsule twice a day 5 days Patient not taking: Reported on 07/26/2023 06/22/23     pantoprazole  (PROTONIX ) 40 MG tablet Take 1 tablet (40 mg total) by mouth daily with a meal. 03/25/24     Propylene  Glycol (SYSTANE BALANCE OP) Place 1 drop into both eyes 2 (two) times daily as needed (for dryness).    [provider]  rosuvastatin  (CRESTOR ) 10 MG tablet Take 1 tablet (10 mg total) by mouth daily. 12/08/23 12/07/24  Court Dorn PARAS, MD  tamsulosin  (FLOMAX ) 0.4 MG CAPS capsule Take 1 capsule (0.4 mg total) by mouth daily. 02/19/24     triamcinolone  cream (KENALOG) 0.1 % Apply topically 2 (two) times daily to affected area on leg for up to 2 weeks as needed for itching and flares. 03/28/24       Allergies: Patient has no known allergies.    Review of Systems  Constitutional:  Positive  for diaphoresis and fatigue. Negative for chills and fever.  HENT:  Negative for congestion.   Respiratory:  Positive for shortness of breath. Negative for cough, chest tightness and wheezing.   Cardiovascular:  Positive for chest pain, palpitations, claudication and syncope. Negative for leg swelling.  Gastrointestinal:  Positive for nausea. Negative for abdominal pain, constipation, diarrhea and vomiting.  Genitourinary:  Negative for dysuria and flank pain.  Musculoskeletal:  Negative for back pain, neck pain and neck stiffness.  Skin:  Negative for wound.  Neurological:  Positive for syncope, light-headedness and headaches.  Psychiatric/Behavioral:  Negative for agitation and confusion.   All other systems reviewed and are negative.   Updated Vital Signs BP 102/73   Pulse (!) 57   Temp 97.6 F (36.4 C) (Oral)   Resp (!) 23   Ht 5' 9 (1.753 m)   Wt 61.2 kg   SpO2 100%   BMI 19.94 kg/m   Physical Exam Vitals and nursing note reviewed.  Constitutional:      General: He is not in acute distress.    Appearance: He is well-developed. He is not ill-appearing, toxic-appearing or diaphoretic.  HENT:     Head: Normocephalic and atraumatic.  Eyes:     Extraocular Movements: Extraocular movements intact.     Conjunctiva/sclera: Conjunctivae normal.     Pupils: Pupils are equal, round, and reactive to light.  Cardiovascular:     Rate and Rhythm: Tachycardia present. Rhythm irregular.     Heart sounds: Murmur heard.  Pulmonary:     Effort: Pulmonary effort is normal. No respiratory distress.     Breath sounds: Normal breath sounds.  Chest:     Chest wall: No tenderness.  Abdominal:     Palpations: Abdomen is soft.     Tenderness: There is no abdominal tenderness.  Musculoskeletal:        General: No swelling.     Cervical back: Neck supple.     Right lower leg: No edema.     Left lower leg: No edema.  Skin:    General: Skin is warm and dry.     Capillary Refill: Capillary  refill takes less than 2 seconds.  Neurological:     Mental Status: He is alert.  Psychiatric:        Mood and Affect: Mood normal.     (all labs ordered are listed, but only abnormal results are displayed) Labs Reviewed  CBC WITH DIFFERENTIAL/PLATELET - Abnormal; Notable for the following components:      Result Value   RBC 4.16 (*)    Platelets 116 (*)    All other components within normal limits  COMPREHENSIVE METABOLIC PANEL WITH GFR - Abnormal; Notable for the following components:   Potassium 3.2 (*)    Chloride 112 (*)  CO2 19 (*)    Glucose, Bld 104 (*)    BUN 31 (*)    Creatinine, Ser 1.30 (*)    Calcium  7.0 (*)    Total Protein 5.0 (*)    Albumin 3.0 (*)    Total Bilirubin 1.3 (*)    GFR, Estimated 50 (*)    All other components within normal limits  BRAIN NATRIURETIC PEPTIDE - Abnormal; Notable for the following components:   B Natriuretic Peptide 1,018.0 (*)    All other components within normal limits  I-STAT CG4 LACTIC ACID, ED - Abnormal; Notable for the following components:   Lactic Acid, Venous 2.0 (*)    All other components within normal limits  I-STAT CHEM 8, ED - Abnormal; Notable for the following components:   BUN 35 (*)    Creatinine, Ser 1.60 (*)    Glucose, Bld 112 (*)    Calcium , Ion 1.07 (*)    TCO2 21 (*)    All other components within normal limits  TROPONIN I (HIGH SENSITIVITY) - Abnormal; Notable for the following components:   Troponin I (High Sensitivity) 75 (*)    All other components within normal limits  TROPONIN I (HIGH SENSITIVITY) - Abnormal; Notable for the following components:   Troponin I (High Sensitivity) 445 (*)    All other components within normal limits  MAGNESIUM   TSH  URINALYSIS, W/ REFLEX TO CULTURE (INFECTION SUSPECTED)  I-STAT CG4 LACTIC ACID, ED  TROPONIN I (HIGH SENSITIVITY)    EKG: EKG Interpretation Date/Time:  Friday March 29 2024 09:43:24 EST Ventricular Rate:  149 PR Interval:    QRS  Duration:  122 QT Interval:  335 QTC Calculation: 528 R Axis:   -39  Text Interpretation: Wide-QRS tachycardia Right bundle branch block when compared to prior, faster rate No STEMI Confirmed by Ginger Barefoot ([id number]) on 03/29/2024 11:14:52 AM  Radiology: DG Chest Portable 1 View Result Date: 03/29/2024 EXAM: 1 VIEW(S) XRAY OF THE CHEST 03/29/2024 10:02:00 AM COMPARISON: 01/24/2024 CLINICAL HISTORY: Ventricular tachycardia status post shock, A-fib with RVR, syncope, hypotension. FINDINGS: LINES, TUBES AND DEVICES: Monitor wires noted. LUNGS AND PLEURA: Small left pleural effusion and overlying left lower lobe consolidation, atelectasis versus infiltrate. Calcified pleural plaques in left mid field and calcified pleural plaque overlying right hemidiaphragm, unchanged. No pneumothorax. HEART AND MEDIASTINUM: Stable mediastinum with aortic atherosclerosis and tortuosity. Central vascular prominence without overt edema. No acute abnormality of the cardiac and mediastinal silhouettes. BONES AND SOFT TISSUES: Osteopenia and thoracic spondylosis. Mild upper thoracic levoscoliosis again noted. IMPRESSION: 1. Small left pleural effusion with overlying left lower lobe consolidation, atelectasis versus infiltrate. 2. Chronic calcified pleural plaques bilaterally, unchanged. Electronically signed by: Dorethia Molt MD 03/29/2024 10:25 AM EST RP Workstation: [id number]     Procedures   CRITICAL CARE Performed by: Richard Reed Total critical care time: 20 minutes Critical care time was exclusive of separately billable procedures and treating other patients. Critical care was necessary to treat or prevent imminent or life-threatening deterioration. Critical care was time spent personally by me on the following activities: development of treatment plan with patient and/or surrogate as well as nursing, discussions with consultants, evaluation of patient's response to treatment, examination of patient,  obtaining history from patient or surrogate, ordering and performing treatments and interventions, ordering and review of laboratory studies, ordering and review of radiographic studies, pulse oximetry and re-evaluation of patient's condition.   Medications Ordered in the ED  sodium chloride  0.9 % bolus 500 mL (has no  administration in time range)                                    Medical Decision Making Amount and/or Complexity of Data Reviewed Labs: ordered. Radiology: ordered.  Risk Prescription drug management. Decision regarding hospitalization.    CLIDE REMMERS is a 89 y.o. male with a past medical history significant for paroxysmal atrial fibrillation on Eliquis  therapy, previous prostate cancer CAD, CKD, CHF previous vertebrobasilar insufficiency, aortic insufficiency, and previous stroke who presents for chest pain with arrhythmia.  According to EMS and patient report, he was feeling normal going to bed last night.  He says he woke this morning and was feeling ill.  He was having fast heart rate and chest pain and palpitations.  He said that it worsened and when EMS got there he reportedly went unresponsive.  He initially was in SVT but then it switched into V. tach and patient went unresponsive and they barely had a pulse.  At that point patient was shocked with what appears to be defibrillation at 360 J and has been in A-fib with RVR since then.  Patient currently asymptomatic and is denying any symptoms.  He does report that he felt a chest pressure with diaphoresis and nausea before he went unresponsive.  He said he was feeling well the last couple days and otherwise denies recent fevers, chills, congestion, cough, constipation, diarrhea, or urinary changes.  There was no reported trauma.  On arrival, patient is resting comfortably and is in no distress although his heart rate is in the 130s to 140s and initial EKG does not show STEMI but does show A-fib with RVR.  I have the  telemetry strips that does appear to show ventricular tachycardia before it was defibrillated at 360 J.  Cardiology was called who will come see patient but anticipate a medicine admission given his other medical problems.  Will get screening labs abdomen his soft pressures will give a small amount of fluids.  Will admit to medicine after workup is further and cardiology sees patient.  Workup continues to return.  X-ray returned showing pleural effusion and possible atelectasis versus pneumonia.  Will discuss with patient previous had URI symptoms.   11:47 AM Patient and family are adamant he has had no URI symptoms with no cough, chills, congestion, or URI symptoms.  They do not suspect he has pneumonia.  Will hold on antibiotics and defer to the admitting team as to antibiotics or not.  Initially I called unassigned medicine but they say he is a hospitalist admit so we will wait for hospitalist call.  Cardiology requested amiodarone  drip which I ordered.  He will be admitted by medicine for further management.      Final diagnoses:  V-tach [facility])  Shockable cardiac rhythm detected by automated external defibrillator  Atrial fibrillation with RVR (HCC)    Clinical Impression: 1. V-tach (HCC)   2. Shockable cardiac rhythm detected by automated external defibrillator   3. Atrial fibrillation with RVR (HCC)     Disposition: Admit  This note was prepared with assistance of Dragon voice recognition software. Occasional wrong-word or sound-a-like substitutions may have occurred due to the inherent limitations of voice recognition software.     Maryetta Shafer, Richard PARAS, MD 03/29/24 1321

## 2024-03-29 NOTE — Consult Note (Addendum)
 Cardiology Consultation   Patient ID: VIN YONKE MRN: [id number]; DOB: 09-07-1925  Admit date: 03/29/2024 Date of Consult: 03/29/2024  PCP:  Marvene Prentice SAUNDERS, FNP   [facility] Providers Cardiologist:  Dorn Lesches, MD        Patient Profile: Richard Reed is a 89 y.o. male with a hx of CAD, PAF, HTN, HLD, HFrEF (Reportedly 30% at hospital in [place] ), moderate aortic insufficiency who is being seen 03/29/2024 for the evaluation of chest pain and A-fib with RVR at the request of Cristopher Tegeler, MD.  RHC/LHC in in 2012 revealed 40-60% ostial right coronary artery stenosis. RHC unremarkable. One admission in November of 2024 in [place]  for CHF.  Last seen by cardiology in April of 2025 when he was kept on eliquis , metoprolol , and statin.  Brought in from [facility] with chest pain. Had pain, diaphoresis, and nausea, then collapsed and was found in SVT, converted to Vtach, and was given a 360J shock. Converted to A-fib with RVR. Arrived with tachycardia and narrow pulse pressure.  History of Present Illness: Mr. Jeffreys reports that this morning he had significant chest pain from his chest to his back that lasted for a couple of minutes. He had no associated shortness of breath. He has noticed worsening edema in his legs. He felt fine prior to the episode this morning. He was shocked by EMS given his v-tach. He denies nausea and vomiting. He did feel lightheaded and had an episode of his vision blacking out this morning. He is currently chest pain free without shortness of breath. Swelling reportedly worsening over the last couple of weeks.   Past Medical History:  Diagnosis Date   Angina    Aortic valve insufficiency, moderate    a. 02/2016 Echo: mild AS, mild to mod AI.   Arthritis    Arthritis pain    CAD (coronary artery disease)    a. 09/2010 Cath: RCA 40-60 ost, otw nl cors;  b. 03/2016 Lexiscan  MV: EF 46%, no ischemia, low risk.   Cancer [facility])     Carotid disease, bilateral    a. 02/2016 Carotid U/S: 1-39% bilat ICA plaquing.   Cerebral aneurysm    a. 02/2016 MRA: fusiform bilobed aneurysm @ R MCA bifurcation.   Cerebrovascular disease    a. 02/2016 MRA: long segment narrowing of basilar artery, moderate bilat posterior inferior cerebellar artery dzs.   Chronic combined systolic and diastolic CHF (congestive heart failure) (HCC)    a. 02/2016 Admitted to hospital in [place]  w/ CHF;  b. 02/2016 Echo ([facility]): EF 45-50%, diff HK, gr1DD, mild AS, mild to mod AI, mildly dil Ao root, PASP .   CKD (chronic kidney disease), stage III (HCC)    Dysrhythmia    Essential hypertension    Hard of hearing    Hemiparesis and alteration of sensations as late effects of stroke (HCC) 04/29/2015   Hiatal hernia    History of kidney stones    HOH (hard of hearing)    Hyperlipidemia    Kidney stones    Moderate aortic insufficiency    NICM (nonischemic cardiomyopathy) (HCC)    a. 02/2016 Echo: EF 45-50%, diff HK;  b. 03/2016 Lexiscan  MV: No ischemia, EF 46%.   NSVT (nonsustained ventricular tachycardia) (HCC)    a. 03/2016 noted on event monitor.   PAF (paroxysmal atrial fibrillation) (HCC)    a. 2012 - noted in setting of E coli bacteremia and septic shock.   Pneumonia 03/23/2011   first  time   Prostate cancer (HCC)    Sepsis due to Escherichia coli (HCC) 03/24/11   Syncope    a. 02/2016 presumed to be 2/2 hypotension/dehydration and basilar artery dzs noted on MRA.   TIA (transient ischemic attack)    Vertebrobasilar insufficiency 04/28/2016   Visual disturbance 03/19/2014    Past Surgical History:  Procedure Laterality Date   CARDIAC CATHETERIZATION  09/2010   non obstructive CAD, 40-60% ostial RCA stenosis without dampening    CATARACT EXTRACTION Bilateral    cerebral angiography     CHOLECYSTECTOMY  04/15/2011   Procedure: LAPAROSCOPIC CHOLECYSTECTOMY WITH INTRAOPERATIVE CHOLANGIOGRAM;  Surgeon: Lynwood MALVA Kimble DOUGLAS, MD;  Location: [facility]  OR;  Service: General;  Laterality: N/A;   CYSTOSCOPY WITH STENT PLACEMENT Bilateral 04/07/2022   Procedure: CYSTOSCOPY WITH BILATERAL STENT PLACEMENT;  Surgeon: Nieves Cough, MD;  Location: [facility] ORS;  Service: Urology;  Laterality: Bilateral;  30 MINS FRO CASE   CYSTOSCOPY/URETEROSCOPY/HOLMIUM LASER/STENT PLACEMENT Bilateral 05/03/2022   Procedure: CYSTOSCOPY BILATERAL URETEROSCOPY/HOLMIUM LASER/STENT PLACEMENT;  Surgeon: Nieves Cough, MD;  Location: [facility]S;  Service: Urology;  Laterality: Bilateral;  2 HRS FOR CASE   EXTRACORPOREAL SHOCK WAVE LITHOTRIPSY Left 12/12/2016   Procedure: LEFT EXTRACORPOREAL SHOCK WAVE LITHOTRIPSY (ESWL);  Surgeon: Cam Morene ORN, MD;  Location: [facility] ORS;  Service: Urology;  Laterality: Left;   IR ANGIO INTRA EXTRACRAN SEL COM CAROTID INNOMINATE BILAT MOD SED  03/21/2019   IR ANGIO VERTEBRAL SEL SUBCLAVIAN INNOMINATE BILAT MOD SED  03/21/2019   IR ANGIOGRAM SELECTIVE EACH ADDITIONAL VESSEL  04/20/2022   IR ANGIOGRAM SELECTIVE EACH ADDITIONAL VESSEL  04/20/2022   IR ANGIOGRAM VISCERAL SELECTIVE  04/20/2022   IR EMBO ART  VEN HEMORR LYMPH EXTRAV  INC GUIDE ROADMAPPING  04/20/2022   IR GENERIC HISTORICAL  03/17/2016   IR ANGIO VERTEBRAL SEL SUBCLAVIAN INNOMINATE BILAT MOD SED 03/17/2016 Thyra Nash, MD [facility]   IR GENERIC HISTORICAL  03/17/2016   IR ANGIO INTRA EXTRACRAN SEL COM CAROTID INNOMINATE BILAT MOD SED 03/17/2016 Thyra Nash, MD [facility]   IR US  GUIDE VASC ACCESS RIGHT  04/20/2022   PROSTATE SURGERY     approx 10 years ago, seed implant radiation   TONSILLECTOMY       Home Medications:  Prior to Admission medications  Medication Sig Start Date End Date Taking? Authorizing Provider  apixaban  (ELIQUIS ) 2.5 MG TABS tablet Take 1 tablet (2.5 mg total) by mouth 2 (two) times daily. 05/26/23   Court Dorn PARAS, MD  B Complex-C (SUPER B COMPLEX PO) Take 1 tablet by mouth daily.    [provider]  cephALEXin  (KEFLEX ) 500 MG capsule  Take 1 capsule (500 mg total) by mouth every 6 (six) hours. 01/02/24     diltiazem  (CARDIZEM  CD) 180 MG 24 hr capsule Take 1 capsule (180 mg total) by mouth daily. 12/08/23   Court Dorn PARAS, MD  doxycycline  (VIBRAMYCIN ) 100 MG capsule Take 1 capsule (100 mg total) by mouth 2 (two) times daily. 01/24/24     erythromycin  ophthalmic ointment APPLY INTO THE LOWER CONJUNCTIVAL SAC IN THE RIGHT EYE 3 TIMES PER DAY FOR 10 DAYS. 02/13/23     erythromycin  ophthalmic ointment APPLY INTO THE LOWER CONJUNCTIVAL SAC IN RIGHT EYE BY OPHTHALMIC ROUTE 2-3 TIMES PER DAY Patient not taking: Reported on 07/26/2023 06/19/23     feeding supplement, ENSURE ENLIVE, (ENSURE ENLIVE) LIQD Take 237 mLs by mouth 2 (two) times daily between meals. Patient taking differently: Take 237 mLs by mouth daily. 03/02/16  Vann, Jessica U, DO  finasteride  (PROSCAR ) 5 MG tablet Take 1 tablet (5 mg total) by mouth daily. 04/04/23     furosemide  (LASIX ) 20 MG tablet Take 1 tablet (20 mg total) by mouth daily. 12/08/23   Court Dorn PARAS, MD  methylPREDNISolone  (MEDROL  DOSEPAK) 4 MG TBPK tablet take as directed on package 03/25/24     metoprolol  tartrate (LOPRESSOR ) 25 MG tablet Take 1 tablet (25 mg total) by mouth 2 (two) times daily. 08/04/23   Court Dorn PARAS, MD  molnupiravir  EUA (LAGEVRIO ) 200 MG CAPS capsule Take 4 capsules (800 mg total) by mouth every 12 (twelve) hours for 5 days. Patient not taking: Reported on 07/26/2023 07/19/23     Multiple Vitamins-Minerals (PRESERVISION AREDS 2) CAPS Take 1 capsule by mouth in the morning and at bedtime.    [provider]  neomycin -polymyxin b -dexamethasone  (MAXITROL ) 3.5-10000-0.1 SUSP Place 1 drop into the right eye every 4 (four) hours for 7 days, THEN 1 drop every 8 (eight) hours for 7 days then stop. 03/20/24 04/04/24    oseltamivir  (TAMIFLU ) 30 MG capsule Take 2 capsules by mouth with first dose, then 1 capsule twice a day 5 days Patient not taking: Reported on 07/26/2023 06/22/23      pantoprazole  (PROTONIX ) 40 MG tablet Take 1 tablet (40 mg total) by mouth daily with a meal. 03/25/24     Propylene Glycol (SYSTANE BALANCE OP) Place 1 drop into both eyes 2 (two) times daily as needed (for dryness).    [provider]  rosuvastatin  (CRESTOR ) 10 MG tablet Take 1 tablet (10 mg total) by mouth daily. 12/08/23 12/07/24  Court Dorn PARAS, MD  tamsulosin  (FLOMAX ) 0.4 MG CAPS capsule Take 1 capsule (0.4 mg total) by mouth daily. 02/19/24     triamcinolone  cream (KENALOG) 0.1 % Apply topically 2 (two) times daily to affected area on leg for up to 2 weeks as needed for itching and flares. 03/28/24        Allergies:   Allergies[1]  Social History:   Social History   Socioeconomic History   Marital status: Married    Spouse name: Not on file   Number of children: 2   Years of education: HS   Highest education level: Not on file  Occupational History   Occupation: Retired  Tobacco Use   Smoking status: Never   Smokeless tobacco: Never  Vaping Use   Vaping status: Never Used  Substance and Sexual Activity   Alcohol  use: No   Drug use: No   Sexual activity: Yes  Other Topics Concern   Not on file  Social History Narrative   Medical Decision Maker:  KAELUM KISSICK (wife) Cell. [phone] House: [phone].   Daughter: Darice Sheffield: Cell [phone]   Full Code   Patient is Left handed.   Patient drinks 1 cup of caffeine daily.   Patient is left handed.   Lives at [facility]    Social Drivers of Health   Tobacco Use: Low Risk (07/26/2023)   Patient History    Smoking Tobacco Use: Never    Smokeless Tobacco Use: Never    Passive Exposure: Not on file  Financial Resource Strain: Not on file  Food Insecurity: No Food Insecurity (04/21/2022)   Hunger Vital Sign    Worried About Running Out of Food in the Last Year: Never true    Ran Out of Food in the Last Year: Never true  Transportation Needs: No Transportation Needs (04/21/2022)   PRAPARE -  Administrator, Civil Service (Medical): No    Lack of Transportation (Non-Medical): No  Physical Activity: Not on file  Stress: Not on file  Social Connections: Not on file  Intimate Partner Violence: Not At Risk (04/21/2022)   Humiliation, Afraid, Rape, and Kick questionnaire    Fear of Current or Ex-Partner: No    Emotionally Abused: No    Physically Abused: No    Sexually Abused: No  Depression (PHQ2-9): Not on file  Alcohol  Screen: Not on file  Housing: Low Risk (04/21/2022)   Housing    Last Housing Risk Score: 0  Utilities: Not At Risk (04/21/2022)   AHC Utilities    Threatened with loss of utilities: No  Health Literacy: Not on file    Family History:   Family History  Problem Relation Age of Onset   Heart failure Mother    Heart disease Mother    Heart disease Father    Cancer Brother        prostate     ROS:  Please see the history of present illness.    Physical Exam/Data: Vitals:   03/29/24 0945 03/29/24 1034 03/29/24 1035 03/29/24 1100  BP: 102/73 99/73  93/81  Pulse: (!) 57     Resp: (!) 23  18 (!) 21  Temp: 97.6 F (36.4 C)     TempSrc: Oral     SpO2: 100%     Weight: 61.2 kg     Height: 5' 9 (1.753 m)      No intake or output data in the 24 hours ending 03/29/24 1126    03/29/2024    9:45 AM 07/26/2023    3:27 PM 06/06/2023    9:28 AM  Last 3 Weights  Weight (lbs) 135 lb 143 lb 140 lb 6.4 oz  Weight (kg) 61.236 kg 64.864 kg 63.685 kg     Body mass index is 19.94 kg/m.  General:  Elderly gentleman in NAD HEENT: normal. JVD to mandible Vascular: No carotid bruits Cardiac:  normal S1, S2; Tachycardic. Diastolic murmur Lungs:  Diminished breath sounds at bases Abd: soft, nontender, no hepatomegaly  Ext: 2+ peripheral edema to the knees Skin: warm and dry  Psych:  Normal affect   EKG:  The EKG was personally reviewed and demonstrates:  Tachycardia without ischemia  Relevant CV Studies: Echo from [place]  in 2024 reportedly 30%,  but do not see this echo report, only documentation in other physician notes.  Last Echo 2021 1. Left ventricular ejection fraction, by estimation, is 40 to 45%. The  left ventricle has mildly decreased function. The left ventricle  demonstrates global hypokinesis. The left ventricular internal cavity size  was moderately dilated. There is mild  left ventricular hypertrophy of the basal-septal segment. Left ventricular  diastolic parameters are consistent with Grade I diastolic dysfunction  (impaired relaxation). Elevated left ventricular end-diastolic pressure.   2. Right ventricular systolic function is normal. The right ventricular  size is normal. There is normal pulmonary artery systolic pressure.   3. Left atrial size was severely dilated.   4. The mitral valve is degenerative. Mild to moderate mitral valve  regurgitation. No evidence of mitral stenosis.   5. The aortic valve is tricuspid. Aortic valve regurgitation is moderate.  Moderate aortic valve stenosis. Aortic regurgitation PHT measures 410  msec. Aortic valve area, by VTI measures 1.23 cm. Aortic valve mean  gradient measures 22.0 mmHg. Aortic  valve Vmax measures 3.03 m/s.   6. Aortic dilatation  noted. There is mild dilatation of the aortic root  and moderate dilatation of the ascending aorta measuring 41 mm and 46mm.   7. The inferior vena cava is normal in size with greater than 50%  respiratory variability, suggesting right atrial pressure of 3 mmHg.   Laboratory Data: High Sensitivity Troponin:   Recent Labs  Lab 03/29/24 0955  TROPONINIHS 75*   No results for input(s): TRNPT in the last 720 hours.    Chemistry Recent Labs  Lab 03/29/24 0955 03/29/24 1012  NA 141 143  K 3.2* 3.7  CL 112* 108  CO2 19*  --   GLUCOSE 104* 112*  BUN 31* 35*  CREATININE 1.30* 1.60*  CALCIUM  7.0*  --   MG 1.8  --   GFRNONAA 50*  --   ANIONGAP 10  --     Recent Labs  Lab 03/29/24 0955  PROT 5.0*  ALBUMIN 3.0*  AST  24  ALT 33  ALKPHOS 53  BILITOT 1.3*   Lipids No results for input(s): CHOL, TRIG, HDL, LABVLDL, LDLCALC, CHOLHDL in the last 168 hours.  Hematology Recent Labs  Lab 03/29/24 0955 03/29/24 1012  WBC 4.7  --   RBC 4.16*  --   HGB 13.5 13.6  HCT 40.9 40.0  MCV 98.3  --   MCH 32.5  --   MCHC 33.0  --   RDW 14.4  --   PLT 116*  --    Thyroid   Recent Labs  Lab 03/29/24 0955  TSH 2.481    BNP Recent Labs  Lab 03/29/24 0955  BNP 1,018.0*    DDimer No results for input(s): DDIMER in the last 168 hours.  Radiology/Studies:  DG Chest Portable 1 View Result Date: 03/29/2024 EXAM: 1 VIEW(S) XRAY OF THE CHEST 03/29/2024 10:02:00 AM COMPARISON: 01/24/2024 CLINICAL HISTORY: Ventricular tachycardia status post shock, A-fib with RVR, syncope, hypotension. FINDINGS: LINES, TUBES AND DEVICES: Monitor wires noted. LUNGS AND PLEURA: Small left pleural effusion and overlying left lower lobe consolidation, atelectasis versus infiltrate. Calcified pleural plaques in left mid field and calcified pleural plaque overlying right hemidiaphragm, unchanged. No pneumothorax. HEART AND MEDIASTINUM: Stable mediastinum with aortic atherosclerosis and tortuosity. Central vascular prominence without overt edema. No acute abnormality of the cardiac and mediastinal silhouettes. BONES AND SOFT TISSUES: Osteopenia and thoracic spondylosis. Mild upper thoracic levoscoliosis again noted. IMPRESSION: 1. Small left pleural effusion with overlying left lower lobe consolidation, atelectasis versus infiltrate. 2. Chronic calcified pleural plaques bilaterally, unchanged. Electronically signed by: Dorethia Molt MD 03/29/2024 10:25 AM EST RP Workstation: [id number]     Assessment and Plan: Chest pain CAD Elevated troponin A-fib with RVR  S/P cardioversion for V-tach Aortic insufficiency Mitral regurgitation Chest pain while at a facility. Found by EMS to be in SVT, subsequently VT, then shocked and  converted to A-fib with RVR. Currently chest pain free and back to baseline. EKG appears non-ischemic. Initial Troponin 75. BNP 1018, elevated from 265 last year in March. BP soft and with narrow pulse pressure. HR sustained in the 130s to 140s. Appears volume overloaded on exam given peripheral edema. Unclear whether tachycardia is reactive to heart failure exacerbation or causative in this gentleman. Low BP prevents Metoprolol  or Cardizem  drip. He is on diltiazem  180mg  daily and Metoprolol  at his facility  -Amiodarone  drip -Monitor heart rate -Trend troponin -Diuresis as below -EKG if recurrent chest pain -Hold anti-hypertensives -continue Eliquis  2.5mg  BID  Acute on chronic combined heart failure Home medication Lasix  20mg  daily and Lopressor   25mg  daily. EF reportedly 30% at outside hospital. Currently volume overloaded as evidenced by elevated BNP and physical exam. Could be contributing to a-fib with RVR. Would prefer echo to be preformed after HR better controlled.  -Update Echo after HR controlled -Lasix  40 IV BID -Keep K >4 -Keep Mag>2 -Daily BMP and magnesium  -Daily weights -Strict Is and Os  HLD Continue Rosuvastatin  10mg   Rest per primary  For questions or updates, please contact [facility] Please consult [email or web address] for contact info under    Melvenia Morrison, MD  03/29/2024 11:26 AM  Internal medicine Resident, PGY-1  Patient seen and examined.  Agree with above documentation.  Richard Reed is a 89 year old male with a history of CAD, PAF, chronic systolic heart failure, aortic regurgitation, hypertension who we are consulted for evaluation of A-fib with RVR at the request of Dr. Ginger.  He reports that he has had worsening lower extremity edema over the last week.  This morning states that he had chest pain that lasted for a few minutes.  EMS was called and when they arrived he was on the floor.  From rhythm strip appeared to be in A-fib with RVR.   Subsequently went into wide-complex tachycardia that appears VT.  He went unresponsive and had weak pulse.  He received shock x 1 and converted to A-fib with RVR.  On arrival to ED, initial vital signs notable for BP 102/73, pulse 139, SpO2 100% on room air.  Labs notable for creatinine 1.6, lactate 2.0 > 1.4, BNP 1018, troponin 75, hemoglobin 13.5, platelets 116, TSH 2.5.  EKG shows atrial fibrillation, rate 149, right bundle branch block.  Telemetry shows A-fib with rates 130s to 140s.  On exam, patient is alert and oriented, hard of hearing, irregular rhythm, tachycardic, 2 out of 6 systolic murmur, diminished breath sounds, 2+ lower extremity edema, + JVD.  For his A-fib with RVR, his BP is soft.  Unlikely to tolerate metoprolol  or diltiazem .  He reports compliance with his Eliquis .  Will start amiodarone  drip.  Also had what appeared to be VT status post defibrillation by EMS.  Planning to start amiodarone  as above.  Given age, would not pursue ICD.  For his acute on chronic combined heart failure, he is significantly volume overloaded on exam.  Will start on IV Lasix .  He did report chest pain, which may have been related to arrhythmia.  Initial troponin 75, will trend.  Plan to check echocardiogram once heart rate improved.  Lonni LITTIE Nanas, MD      [1] No Known Allergies

## 2024-03-29 NOTE — ED Notes (Signed)
 CCMD called and notified

## 2024-03-30 ENCOUNTER — Inpatient Hospital Stay (HOSPITAL_COMMUNITY)

## 2024-03-30 ENCOUNTER — Encounter (HOSPITAL_COMMUNITY): Payer: Self-pay | Admitting: Hospitalist

## 2024-03-30 DIAGNOSIS — I4891 Unspecified atrial fibrillation: Secondary | ICD-10-CM | POA: Diagnosis not present

## 2024-03-30 DIAGNOSIS — I5021 Acute systolic (congestive) heart failure: Secondary | ICD-10-CM

## 2024-03-30 DIAGNOSIS — I5043 Acute on chronic combined systolic (congestive) and diastolic (congestive) heart failure: Secondary | ICD-10-CM | POA: Diagnosis not present

## 2024-03-30 DIAGNOSIS — I472 Ventricular tachycardia, unspecified: Secondary | ICD-10-CM | POA: Diagnosis not present

## 2024-03-30 LAB — ECHOCARDIOGRAM COMPLETE
AR max vel: 0.85 cm2
AV Area VTI: 0.91 cm2
AV Area mean vel: 0.73 cm2
AV Mean grad: 19 mmHg
AV Peak grad: 29.5 mmHg
Ao pk vel: 2.72 m/s
Area-P 1/2: 5.02 cm2
Calc EF: 38.4 %
Height: 69 in
MV VTI: 1.78 cm2
P 1/2 time: 582 ms
S' Lateral: 4.2 cm
Single Plane A2C EF: 43.1 %
Single Plane A4C EF: 33.9 %
Weight: 2194.02 [oz_av]

## 2024-03-30 LAB — BASIC METABOLIC PANEL WITH GFR
Anion gap: 5 (ref 5–15)
BUN: 33 mg/dL — ABNORMAL HIGH (ref 8–23)
CO2: 27 mmol/L (ref 22–32)
Calcium: 8.8 mg/dL — ABNORMAL LOW (ref 8.9–10.3)
Chloride: 108 mmol/L (ref 98–111)
Creatinine, Ser: 1.58 mg/dL — ABNORMAL HIGH (ref 0.61–1.24)
GFR, Estimated: 40 mL/min — ABNORMAL LOW (ref >60)
Glucose, Bld: 106 mg/dL — ABNORMAL HIGH (ref 70–99)
Potassium: 4.5 mmol/L (ref 3.5–5.1)
Sodium: 140 mmol/L (ref 135–145)

## 2024-03-30 LAB — MAGNESIUM: Magnesium: 2.1 mg/dL (ref 1.7–2.4)

## 2024-03-30 MED ORDER — THIAMINE MONONITRATE 100 MG PO TABS
100.0000 mg | ORAL_TABLET | Freq: Every day | ORAL | Status: DC
  Administered 2024-03-30 – 2024-04-04 (×6): 100 mg via ORAL
  Filled 2024-03-30 (×6): qty 1

## 2024-03-30 MED ORDER — METOPROLOL TARTRATE 25 MG PO TABS
25.0000 mg | ORAL_TABLET | Freq: Two times a day (BID) | ORAL | Status: DC
  Administered 2024-03-30 – 2024-03-31 (×4): 25 mg via ORAL
  Filled 2024-03-30 (×4): qty 1

## 2024-03-30 MED ORDER — METOPROLOL TARTRATE 5 MG/5ML IV SOLN
5.0000 mg | INTRAVENOUS | Status: DC | PRN
  Administered 2024-03-30: 5 mg via INTRAVENOUS
  Filled 2024-03-30: qty 5

## 2024-03-30 MED ORDER — ADULT MULTIVITAMIN W/MINERALS CH
1.0000 | ORAL_TABLET | Freq: Every day | ORAL | Status: DC
  Administered 2024-03-30 – 2024-04-04 (×6): 1 via ORAL
  Filled 2024-03-30 (×6): qty 1

## 2024-03-30 NOTE — Progress Notes (Signed)
 Progress Note   Patient: Richard Reed FMW:[id number] DOB: 08/01/1925 DOA: 03/29/2024     1 DOS: the patient was seen and examined on 03/30/2024   Brief hospital course: Richard Reed is a 89 y.o. male with medical history significant of PAF on low-dose Eliquis , history of prostate cancer, CAD involving the RCA medical therapy only with cardiac cath in 2012, chronic kidney disease stage III known vertebrobasilar stenosis, hypertension, dyslipidemia, BPH on Flomax ,HFrEF with a component of diastolic dysfunction, mild to moderate MR, moderate AR, moderate AS.  Patient is very hard of hearing.  Patient was in his usual state of health.  He reports that he went to bed feeling okay.  He woke up at about 4 AM to go to the restroom and noticed his pajamas were wet.  At that point he attempted to go back to sleep but was having difficulty.  Eventually he developed abrupt onset of midsternal chest pain that radiated to the back this was associated with severe nausea diaphoresis as well as shortness of breath.  EMS was subsequently called and patient came to the emergency room. Upon presentation to the ED he was in A-fib with RVR with ventricular responses in the 130-140 range.  Cardiologist consulted  Assessment and Plan: Atrial fibrillation with RVR Ventricular tachycardia Cardiologist on board.  Appreciate input Currently on amiodarone  drip Patient also had episodes of ventricular tachycardia in the field requiring defibrillation 360 J x 1. Continue home Eliquis  Keep magnesium  and potassium within normal limits Metoprolol  added today by cardiologist    Acute exacerbation of HFrEF Diastolic dysfunction Known mild to moderate MR, moderate AR, and moderate AS per echo 2021 Suspect secondary to at least one week of RVR based on history given by patient and family Continue IV Lasix  40 mg twice daily with potassium supplementation-follow electrolyte Holding calcium  channel blocker  Follow-up on  echocardiogram Strict intake and output and daily weights   CAD with RCA 40 to 60% ostial in 2012, medically managed Continue statin therapy as well as metoprolol    Hypertension Continue current antihypertensives about   CKD stage IIIa Baseline creatinine was 1.15 in January 2024 and in March 2025 was 1.54 Current creatinine at baseline is 1.6 Monitor renal function closely   Thrombocytopenia Documented in March 2024 with platelets 119,000 Current platelets are 116,000 No signs of bleeding Continue to follow   Known vertebrobasilar stenosis Currently asymptomatic   History of prostate cancer/BPH Continue Flomax    HLD Continue Crestor    Protein calorie malnutrition Last known weight was 61.2 kg with a BMI of 19.9 Nutrition consultation Ensure supplementation   Physical deconditioning According to family patient will walk from his room at the [facility] to the common area where he sits on the couch most days PT/OT evaluation once RVR resolved    Advance Care Planning:   Code Status: Full Code    VTE prophylaxis: Eliquis    Consults: Cardiology   Family Communication: Daughter at bedside  Subjective:  Patient seen and examined at bedside this morning Denies nausea vomiting abdominal pain or chest pain Very hard at hearing Continues to be on amiodarone  drip  Physical Exam:  Constitutional: NAD, calm, comfortable Respiratory: A few fine bilateral crackles but lungs notably diminished throughout all fields, normal respiratory effort. No accessory muscle use.  Currently on room air at rest Cardiovascular: Irregular rate and in atrial fibrillation, no murmurs / rubs / gallops.  2+ spongy bilateral lower extremity edema. 2+ pedal pulses.  Positive bilateral JVD. Abdomen:  no tenderness, no masses palpated. No obvious hepatosplenomegaly. Bowel sounds positive.  Musculoskeletal: no clubbing / cyanosis.  Bilateral lower extremity pitting edema Skin: no rashes, lesions, ulcers.  No induration Neurologic: CN 2-12 grossly intact except for known significant bilateral hearing loss.  Patient wears bilateral hearing aid.    Data Reviewed: Chest x-ray showing small left-sided pleural effusion    Latest Ref Rng & Units 03/29/2024   10:12 AM 03/29/2024    9:55 AM 07/03/2022    3:58 PM  CBC  WBC 4.0 - 10.5 K/uL  4.7  3.1   Hemoglobin 13.0 - 17.0 g/dL 86.3  86.4  87.8   Hematocrit 39.0 - 52.0 % 40.0  40.9  37.5   Platelets 150 - 400 K/uL  116  119        Latest Ref Rng & Units 03/30/2024    3:49 AM 03/29/2024   10:12 AM 03/29/2024    9:55 AM  BMP  Glucose 70 - 99 mg/dL 893  887  895   BUN 8 - 23 mg/dL 33  35  31   Creatinine 0.61 - 1.24 mg/dL 8.41  8.39  8.69   Sodium 135 - 145 mmol/L 140  143  141   Potassium 3.5 - 5.1 mmol/L 4.5  3.7  3.2   Chloride 98 - 111 mmol/L 108  108  112   CO2 22 - 32 mmol/L 27   19   Calcium  8.9 - 10.3 mg/dL 8.8   7.0     Vitals:   03/30/24 0729 03/30/24 0810 03/30/24 1121 03/30/24 1235  BP: 113/89  113/83 96/65  Pulse: 100 87 (!) 129 69  Resp: (!) 23 13  14   Temp: 97.7 F (36.5 C)   98.5 F (36.9 C)  TempSrc: Oral   Oral  SpO2: 97% 97%  98%  Weight:      Height:        Author: Drue ONEIDA Potter, MD 03/30/2024 3:44 PM  For on call review [email or web address].

## 2024-03-30 NOTE — Plan of Care (Signed)

## 2024-03-30 NOTE — Progress Notes (Signed)
 Rounding Note   Patient Name: Richard Reed Date of Encounter: 03/30/2024  [facility] Cardiologist: Dorn Lesches, MD   Subjective I/O's not recorded.  Creatinine stable at 1.6.  Reports dyspnea improved.  Denies any chest pain currently  Scheduled Meds:  apixaban   2.5 mg Oral BID   feeding supplement  237 mL Oral TID BM   finasteride   5 mg Oral Daily   furosemide   40 mg Intravenous BID   rosuvastatin   10 mg Oral Daily   sodium chloride  flush  3 mL Intravenous Q12H   tamsulosin   0.4 mg Oral Daily   Continuous Infusions:  amiodarone  30 mg/hr (03/30/24 1005)   PRN Meds: acetaminophen  **OR** acetaminophen , metoprolol  tartrate   Vital Signs  Vitals:   03/30/24 0642 03/30/24 0645 03/30/24 0729 03/30/24 0810  BP:  96/83 113/89   Pulse:  88 100 87  Resp: (!) 21 (!) 21 (!) 23 13  Temp:  97.6 F (36.4 C) 97.7 F (36.5 C)   TempSrc:  Oral Oral   SpO2:  94% 97% 97%  Weight:  62.2 kg    Height:        Intake/Output Summary (Last 24 hours) at 03/30/2024 1045 Last data filed at 03/30/2024 1009 Gross per 24 hour  Intake 732.75 ml  Output --  Net 732.75 ml      03/30/2024    6:45 AM 03/29/2024    9:45 AM 07/26/2023    3:27 PM  Last 3 Weights  Weight (lbs) 137 lb 2 oz 135 lb 143 lb  Weight (kg) 62.2 kg 61.236 kg 64.864 kg      Telemetry Currently in A-fib in the 120s to 130s, overnight intermittently in sinus- Personally Reviewed  ECG  No new ECG- Personally Reviewed  Physical Exam  GEN: No acute distress.   Neck: + JVD Cardiac: RRR, no murmurs, rubs, or gallops.  Respiratory: Diminished breath sounds GI: Soft, nontender, non-distended  MS: Trace edema; No deformity. Neuro:  Nonfocal  Psych: Normal affect   Labs High Sensitivity Troponin:   Recent Labs  Lab 03/29/24 0955 03/29/24 1143 03/29/24 1500  TROPONINIHS 75* 445* 906*   No results for input(s): TRNPT in the last 720 hours.     Chemistry Recent Labs  Lab 03/29/24 0955  03/29/24 1012 03/30/24 0349  NA 141 143 140  K 3.2* 3.7 4.5  CL 112* 108 108  CO2 19*  --  27  GLUCOSE 104* 112* 106*  BUN 31* 35* 33*  CREATININE 1.30* 1.60* 1.58*  CALCIUM  7.0*  --  8.8*  MG 1.8  --  2.1  PROT 5.0*  --   --   ALBUMIN 3.0*  --   --   AST 24  --   --   ALT 33  --   --   ALKPHOS 53  --   --   BILITOT 1.3*  --   --   GFRNONAA 50*  --  40*  ANIONGAP 10  --  5    Lipids No results for input(s): CHOL, TRIG, HDL, LABVLDL, LDLCALC, CHOLHDL in the last 168 hours.  Hematology Recent Labs  Lab 03/29/24 0955 03/29/24 1012  WBC 4.7  --   RBC 4.16*  --   HGB 13.5 13.6  HCT 40.9 40.0  MCV 98.3  --   MCH 32.5  --   MCHC 33.0  --   RDW 14.4  --   PLT 116*  --    Thyroid   Recent  Labs  Lab 03/29/24 0955  TSH 2.481    BNP Recent Labs  Lab 03/29/24 0955  BNP 1,018.0*    DDimer No results for input(s): DDIMER in the last 168 hours.   Radiology  DG Chest Portable 1 View Result Date: 03/29/2024 EXAM: 1 VIEW(S) XRAY OF THE CHEST 03/29/2024 10:02:00 AM COMPARISON: 01/24/2024 CLINICAL HISTORY: Ventricular tachycardia status post shock, A-fib with RVR, syncope, hypotension. FINDINGS: LINES, TUBES AND DEVICES: Monitor wires noted. LUNGS AND PLEURA: Small left pleural effusion and overlying left lower lobe consolidation, atelectasis versus infiltrate. Calcified pleural plaques in left mid field and calcified pleural plaque overlying right hemidiaphragm, unchanged. No pneumothorax. HEART AND MEDIASTINUM: Stable mediastinum with aortic atherosclerosis and tortuosity. Central vascular prominence without overt edema. No acute abnormality of the cardiac and mediastinal silhouettes. BONES AND SOFT TISSUES: Osteopenia and thoracic spondylosis. Mild upper thoracic levoscoliosis again noted. IMPRESSION: 1. Small left pleural effusion with overlying left lower lobe consolidation, atelectasis versus infiltrate. 2. Chronic calcified pleural plaques bilaterally, unchanged.  Electronically signed by: Dorethia Molt MD 03/29/2024 10:25 AM EST RP Workstation: [id number]    Cardiac Studies   Patient Profile   89 y.o. male with a hx of CAD, PAF, HTN, HLD, HFrEF (Reportedly 30% at hospital in [place] ), moderate aortic insufficiency who is being seen 03/29/2024 for the evaluation of chest pain and A-fib with RVR   Assessment & Plan  Acute on chronic combined heart failure: EF reportedly 30% at outside hospital.  Presented with volume overload. - Update echo - Volume status appears improved, continue IV Lasix  40 mg twice daily - Strict I's/O's and daily weights  A-fib with RVR: Could be contributing to his decompensated heart failure. - Continue home Eliquis  2.5 mg twice daily - Started on amiodarone  drip - Will add metoprolol  25 mg twice daily  VT: Had episode of sustained VT when being transported by EMS, became unresponsive and received shock x 1.  Started on amiodarone  and metoprolol  as above.  Would not pursue ICD given age  Elevated troponin: 75 > 445 > 906.  Continue Eliquis , statin.  Anticipate conservative management given age and renal function.  D/w patient and daughter and in agreement with not pursuing invasive procedures  CKD: Creatinine 1.58, appears about baseline.  Monitor with diuresis   For questions or updates, please contact [facility] Please consult [email or web address] for contact info under     Signed, Lonni LITTIE Nanas, MD  03/30/2024, 10:45 AM

## 2024-03-30 NOTE — Progress Notes (Signed)
 Initial Nutrition Assessment  DOCUMENTATION CODES:   Not applicable  INTERVENTION:  Liberalize diet to promote po intake Room service with assist  Ensure Plus High Protein po BID, each supplement provides 350 kcal and 20 grams of protein Magic cup TID with meals, each supplement provides 290 kcal and 9 grams of protein MVI with minerals daily  NUTRITION DIAGNOSIS:   Inadequate oral intake related to chronic illness as evidenced by meal completion < 50%.  GOAL:   Patient will meet greater than or equal to 90% of their needs  MONITOR:   PO intake, Supplement acceptance, Labs  REASON FOR ASSESSMENT:   Consult Assessment of nutrition requirement/status  ASSESSMENT:   89 y.o. male with a hx of CAD, PAF, HTN, HLD, HFrEF (Reportedly 30% at hospital in [place] ), moderate aortic insufficiency, CKD III, HOH,  who presented with chest pain and A-fib with RVR, volume overload.  RD working remotely, unable to get into contact with pt. History obtained through chart review, history limited.  Pt was living in an ILF. Unknown how pt's intake was PTA. Weight appears relatively stable in the last year ranging from 62.2 kg-64.9 kg. Dry weight unknown. Noted pt with physical deconditioning. Suspect pt has some form of malnutrition however, unable to confirm at this time. Add ONS to supplement intake.    Admit weight: 61.2 kg Current weight: 62.2 kg  Unknown dry weight Wt Readings from Last 10 Encounters:  03/30/24 62.2 kg  07/26/23 64.9 kg  06/06/23 63.7 kg  02/13/23 62.8 kg  12/07/22 64.7 kg  08/01/22 64.1 kg  07/03/22 68 kg  06/08/22 62.7 kg  05/03/22 65.1 kg  04/29/22 65 kg   Average Meal Intake: No meals recorded  Nutritionally Relevant Medications: Scheduled Meds:  feeding supplement  237 mL Oral TID BM   furosemide   40 mg Intravenous BID   multivitamin with minerals  1 tablet Oral Daily   thiamine   100 mg Oral Daily   Continuous Infusions:  amiodarone  Stopped  (03/30/24 1147)   Labs Reviewed: BUN 33 Creatinine 1.58 CBG ranges from 98-105 mg/dL over the last 24 hours  NUTRITION - FOCUSED PHYSICAL EXAM: - Deferred to follow up   Diet Order:   Diet Order             Diet Heart Room service appropriate? Yes; Fluid consistency: Thin  Diet effective now                   EDUCATION NEEDS:   No education needs have been identified at this time  Skin:  Skin Assessment: Reviewed RN Assessment  Last BM:  PTA  Height:   Ht Readings from Last 1 Encounters:  03/29/24 5' 9 (1.753 m)    Weight:   Wt Readings from Last 1 Encounters:  03/30/24 62.2 kg    Ideal Body Weight:  72.7 kg  BMI:  Body mass index is 20.25 kg/m.  Estimated Nutritional Needs:   Kcal:  1400-1600 kcal  Protein:  75-100 gm  Fluid:  >1.4L/day   Olivia Kenning, RD Registered Dietitian  See Amion for more information

## 2024-03-30 NOTE — Progress Notes (Signed)
 Unable to obtain urine out put as patient politely refused condom catheter or primo fit. Pt wears depends and prefers to use commode or bathroom.

## 2024-03-30 NOTE — Plan of Care (Signed)

## 2024-03-31 DIAGNOSIS — I472 Ventricular tachycardia, unspecified: Secondary | ICD-10-CM | POA: Diagnosis not present

## 2024-03-31 DIAGNOSIS — I5043 Acute on chronic combined systolic (congestive) and diastolic (congestive) heart failure: Secondary | ICD-10-CM | POA: Diagnosis not present

## 2024-03-31 DIAGNOSIS — I4891 Unspecified atrial fibrillation: Secondary | ICD-10-CM | POA: Diagnosis present

## 2024-03-31 LAB — CBC
HCT: 40.6 % (ref 39.0–52.0)
Hemoglobin: 13.9 g/dL (ref 13.0–17.0)
MCH: 32.9 pg (ref 26.0–34.0)
MCHC: 34.2 g/dL (ref 30.0–36.0)
MCV: 96 fL (ref 80.0–100.0)
Platelets: 108 K/uL — ABNORMAL LOW (ref 150–400)
RBC: 4.23 MIL/uL (ref 4.22–5.81)
RDW: 14.6 % (ref 11.5–15.5)
WBC: 4.3 K/uL (ref 4.0–10.5)
nRBC: 0 % (ref 0.0–0.2)

## 2024-03-31 LAB — BASIC METABOLIC PANEL WITH GFR
Anion gap: 11 (ref 5–15)
BUN: 29 mg/dL — ABNORMAL HIGH (ref 8–23)
CO2: 23 mmol/L (ref 22–32)
Calcium: 8.3 mg/dL — ABNORMAL LOW (ref 8.9–10.3)
Chloride: 103 mmol/L (ref 98–111)
Creatinine, Ser: 1.53 mg/dL — ABNORMAL HIGH (ref 0.61–1.24)
GFR, Estimated: 41 mL/min — ABNORMAL LOW (ref >60)
Glucose, Bld: 87 mg/dL (ref 70–99)
Potassium: 3.9 mmol/L (ref 3.5–5.1)
Sodium: 137 mmol/L (ref 135–145)

## 2024-03-31 LAB — MAGNESIUM: Magnesium: 2 mg/dL (ref 1.7–2.4)

## 2024-03-31 MED ORDER — FUROSEMIDE 40 MG PO TABS
40.0000 mg | ORAL_TABLET | Freq: Every day | ORAL | Status: DC
  Administered 2024-04-01: 12:00:00 40 mg via ORAL
  Filled 2024-03-31: qty 1

## 2024-03-31 MED ORDER — PANTOPRAZOLE SODIUM 40 MG PO TBEC
40.0000 mg | DELAYED_RELEASE_TABLET | Freq: Every day | ORAL | Status: DC
  Administered 2024-03-31 – 2024-04-04 (×5): 40 mg via ORAL
  Filled 2024-03-31 (×5): qty 1

## 2024-03-31 MED ORDER — MELATONIN 3 MG PO TABS
3.0000 mg | ORAL_TABLET | Freq: Every evening | ORAL | Status: DC | PRN
  Administered 2024-03-31: 3 mg via ORAL
  Filled 2024-03-31: qty 1

## 2024-03-31 NOTE — Progress Notes (Signed)
 PROGRESS NOTE    Richard Reed  FMW:[id number] DOB: 1925/07/06 DOA: 03/29/2024 PCP: Marvene Prentice SAUNDERS, FNP    Brief Narrative:  Richard Reed is a 89 y.o. male from ILF with medical history significant of PAF on low-dose Eliquis , history of prostate cancer, CAD involving the RCA medical therapy only with cardiac cath in 2012, chronic kidney disease stage III known vertebrobasilar stenosis, hypertension, dyslipidemia, BPH on Flomax ,HFrEF with a component of diastolic dysfunction, mild to moderate MR, moderate AR, moderate AS.  He developed abrupt onset of midsternal chest pain that radiated to the back this was associated with severe nausea diaphoresis as well as shortness of breath.  EMS was subsequently called and patient came to the emergency room. Upon presentation to the ED he was in A-fib with RVR with ventricular responses in the 130-140 range.  Cardiologist consulted   Assessment and Plan: Atrial fibrillation with RVR Ventricular tachycardia Cardiologist on board.  Appreciate input Currently on amiodarone  drip Patient also had episodes of ventricular tachycardia in the field requiring defibrillation 360 J x 1. Continue home Eliquis  Keep magnesium  and potassium within normal limits Metoprolol  added  by cardiologist   Acute exacerbation of HFrEF Diastolic dysfunction Known mild to moderate MR, moderate AR, and moderate AS per echo 2021 Suspect secondary to at least one week of RVR based on history given by patient and family Continue IV Lasix  40 mg twice daily with potassium supplementation-follow electrolyte Holding calcium  channel blocker  -echocardiogram: Left ventricular ejection fraction, by estimation, is 25 to 30%. severely decreased function.  Grade II diastolic dysfunction  Strict intake and output and daily weights   CAD with RCA 40 to 60% ostial in 2012, medically managed Continue statin therapy as well as metoprolol    Hypertension Continue current antihypertensives  about   CKD stage IIIa Baseline creatinine was 1.15 in January 2024 and in March 2025 was 1.54 Current creatinine at baseline is 1.5    Thrombocytopenia Documented in March 2024 with platelets 119,000 No signs of bleeding Continue to follow   Known vertebrobasilar stenosis Currently asymptomatic   History of prostate cancer/BPH Continue Flomax    HLD Continue Crestor    Protein calorie malnutrition Last known weight was 61.2 kg with a BMI of 19.9 Nutrition consultation Ensure supplementation   Physical deconditioning According to family patient will walk from his room at the ILF to the common area where he sits on the couch most days PT/OT evaluation once RVR resolved     DVT prophylaxis: apixaban  (ELIQUIS ) tablet 2.5 mg Start: 03/29/24 1600 apixaban  (ELIQUIS ) tablet 2.5 mg    Code Status: Full Code   Disposition Plan:  Level of care: Progressive Status is: Inpatient     Consultants:  cards   Subjective: Using bathroom, HR up on tele  Objective: Vitals:   03/30/24 1953 03/31/24 0035 03/31/24 0450 03/31/24 0835  BP: 115/89 97/79 109/74 92/70  Pulse: 74 98 93 (!) 103  Resp: 17 (!) 22 (!) 21 20  Temp: 98 F (36.7 C) 98.5 F (36.9 C) 98.6 F (37 C) 98.1 F (36.7 C)  TempSrc: Oral Oral Oral Oral  SpO2: 97% 96% 95% 97%  Weight:   61 kg   Height:        Intake/Output Summary (Last 24 hours) at 03/31/2024 1109 Last data filed at 03/31/2024 0830 Gross per 24 hour  Intake 723.05 ml  Output 300 ml  Net 423.05 ml   Filed Weights   03/29/24 0945 03/30/24 0645 03/31/24 0450  Weight: 61.2 kg 62.2 kg 61 kg    Examination:   General: Appearance:    Thin male in no acute distress     Lungs:     respirations unlabored  Heart:    Tachycardic. irreg     MS:   All extremities are intact.    Neurologic:   Awake, alert       Data Reviewed: I have personally reviewed following labs and imaging studies  CBC: Recent Labs  Lab 03/29/24 0955  03/29/24 1012 03/31/24 0538  WBC 4.7  --  4.3  NEUTROABS 3.5  --   --   HGB 13.5 13.6 13.9  HCT 40.9 40.0 40.6  MCV 98.3  --  96.0  PLT 116*  --  108*   Basic Metabolic Panel: Recent Labs  Lab 03/29/24 0955 03/29/24 1012 03/30/24 0349 03/31/24 0538  NA 141 143 140 137  K 3.2* 3.7 4.5 3.9  CL 112* 108 108 103  CO2 19*  --  27 23  GLUCOSE 104* 112* 106* 87  BUN 31* 35* 33* 29*  CREATININE 1.30* 1.60* 1.58* 1.53*  CALCIUM  7.0*  --  8.8* 8.3*  MG 1.8  --  2.1 2.0   GFR: Estimated Creatinine Clearance: 24.4 mL/min (A) (by C-G formula based on SCr of 1.53 mg/dL (H)). Liver Function Tests: Recent Labs  Lab 03/29/24 0955  AST 24  ALT 33  ALKPHOS 53  BILITOT 1.3*  PROT 5.0*  ALBUMIN 3.0*   No results for input(s): LIPASE, AMYLASE in the last 168 hours. No results for input(s): AMMONIA in the last 168 hours. Coagulation Profile: No results for input(s): INR, PROTIME in the last 168 hours. Cardiac Enzymes: No results for input(s): CKTOTAL, CKMB, CKMBINDEX, TROPONINI in the last 168 hours. BNP (last 3 results) No results for input(s): PROBNP in the last 8760 hours. HbA1C: No results for input(s): HGBA1C in the last 72 hours. CBG: No results for input(s): GLUCAP in the last 168 hours. Lipid Profile: No results for input(s): CHOL, HDL, LDLCALC, TRIG, CHOLHDL, LDLDIRECT in the last 72 hours. Thyroid  Function Tests: Recent Labs    03/29/24 0955  TSH 2.481   Anemia Panel: No results for input(s): VITAMINB12, FOLATE, FERRITIN, TIBC, IRON, RETICCTPCT in the last 72 hours. Sepsis Labs: Recent Labs  Lab 03/29/24 1013 03/29/24 1150  LATICACIDVEN 2.0* 1.4    No results found for this or any previous visit (from the past 240 hours).       Radiology Studies: ECHOCARDIOGRAM COMPLETE Result Date: 03/30/2024    ECHOCARDIOGRAM REPORT   Patient Name:   Richard Reed Date of Exam: 03/30/2024 Medical Rec #:  [id number]       Height:       69.0 in Accession #:    [id number]     Weight:       137.1 lb Date of Birth:  [birth year]      BSA:          1.760 m Patient Age:    89 years       BP:           113/83 mmHg Patient Gender: M              HR:           85 bpm. Exam Location:  Inpatient Procedure: 2D Echo, Cardiac Doppler and Color Doppler (Both Spectral and Color            Flow Doppler were utilized during  procedure).                              MODIFIED REPORT: This report was modified by Redell Shallow MD on 03/30/2024 due to Changes.  Indications:     CHF Acute Systolic I50.21  History:         Patient has prior history of Echocardiogram examinations, most                  recent 09/30/2019. Aortic Valve Disease; Arrythmias:Atrial                  Fibrillation.  Sonographer:     Nathanel Devonshire Referring Phys:  [id number] ISAIAH CROME ELLIS Diagnosing Phys: Redell Shallow MD IMPRESSIONS  1. Global hypokinesis with akinesis of the inferolateral wall; overall severe LV dysfunction.  2. Left ventricular ejection fraction, by estimation, is 25 to 30%. The left ventricle has severely decreased function. The left ventricle demonstrates regional wall motion abnormalities (see scoring diagram/findings for description). Left ventricular diastolic parameters are consistent with Grade II diastolic dysfunction (pseudonormalization). Elevated left atrial pressure.  3. Right ventricular systolic function is normal. The right ventricular size is normal. There is mildly elevated pulmonary artery systolic pressure.  4. Left atrial size was moderately dilated.  5. Right atrial size was moderately dilated.  6. The mitral valve is normal in structure. Mild mitral valve regurgitation. No evidence of mitral stenosis.  7. The aortic valve is tricuspid. Aortic valve regurgitation is mild. Severe aortic valve stenosis.  8. There is moderate dilatation of the ascending aorta, measuring 47 mm.  9. The inferior vena cava is normal in size with greater than 50% respiratory  variability, suggesting right atrial pressure of 3 mmHg. Conclusion(s)/Recommendation(s): No evidence of valvular vegetations on this transthoracic echocardiogram. Consider a transesophageal echocardiogram to exclude infective endocarditis if clinically indicated. FINDINGS  Left Ventricle: Left ventricular ejection fraction, by estimation, is 25 to 30%. The left ventricle has severely decreased function. The left ventricle demonstrates regional wall motion abnormalities. The left ventricular internal cavity size was normal  in size. There is no left ventricular hypertrophy. Left ventricular diastolic parameters are consistent with Grade II diastolic dysfunction (pseudonormalization). Elevated left atrial pressure. Right Ventricle: The right ventricular size is normal. Right ventricular systolic function is normal. There is mildly elevated pulmonary artery systolic pressure. The tricuspid regurgitant velocity is 2.99 m/s, and with an assumed right atrial pressure of 3 mmHg, the estimated right ventricular systolic pressure is 38.8 mmHg. Left Atrium: Left atrial size was moderately dilated. Right Atrium: Right atrial size was moderately dilated. Pericardium: There is no evidence of pericardial effusion. Mitral Valve: The mitral valve is normal in structure. Mild mitral annular calcification. Mild mitral valve regurgitation. No evidence of mitral valve stenosis. MV peak gradient, 7.6 mmHg. The mean mitral valve gradient is 3.0 mmHg. Tricuspid Valve: The tricuspid valve is normal in structure. Tricuspid valve regurgitation is mild . No evidence of tricuspid stenosis. Aortic Valve: The aortic valve is tricuspid. Aortic valve regurgitation is mild. Aortic regurgitation PHT measures 582 msec. Severe aortic stenosis is present. Aortic valve mean gradient measures 19.0 mmHg. Aortic valve peak gradient measures 29.5 mmHg. Aortic valve area, by VTI measures 0.91 cm. Pulmonic Valve: The pulmonic valve was normal in structure.  Pulmonic valve regurgitation is trivial. No evidence of pulmonic stenosis. Aorta: The aortic root is normal in size and structure. There is moderate dilatation of the ascending aorta,  measuring 47 mm. Venous: The inferior vena cava is normal in size with greater than 50% respiratory variability, suggesting right atrial pressure of 3 mmHg. IAS/Shunts: No atrial level shunt detected by color flow Doppler. Additional Comments: Global hypokinesis with akinesis of the inferolateral wall; overall severe LV dysfunction.  LEFT VENTRICLE PLAX 2D LVIDd:         5.00 cm      Diastology LVIDs:         4.20 cm      LV e' medial:    3.16 cm/s LV PW:         1.00 cm      LV E/e' medial:  37.1 LV IVS:        1.20 cm      LV e' lateral:   5.44 cm/s LVOT diam:     2.10 cm      LV E/e' lateral: 21.5 LV SV:         54 LV SV Index:   31 LVOT Area:     3.46 cm  LV Volumes (MOD) LV vol d, MOD A2C: 146.0 ml LV vol d, MOD A4C: 108.0 ml LV vol s, MOD A2C: 83.1 ml LV vol s, MOD A4C: 71.4 ml LV SV MOD A2C:     62.9 ml LV SV MOD A4C:     108.0 ml LV SV MOD BP:      48.7 ml RIGHT VENTRICLE            IVC RV Basal diam:  3.60 cm    IVC diam: 1.80 cm RV S prime:     8.27 cm/s TAPSE (M-mode): 1.4 cm LEFT ATRIUM             Index        RIGHT ATRIUM           Index LA diam:        3.50 cm 1.99 cm/m   RA Area:     20.50 cm LA Vol (A2C):   91.5 ml 51.99 ml/m  RA Volume:   68.10 ml  38.70 ml/m LA Vol (A4C):   60.9 ml 34.61 ml/m LA Biplane Vol: 76.4 ml 43.41 ml/m  AORTIC VALVE AV Area (Vmax):    0.85 cm AV Area (Vmean):   0.73 cm AV Area (VTI):     0.91 cm AV Vmax:           271.50 cm/s AV Vmean:          206.000 cm/s AV VTI:            0.592 m AV Peak Grad:      29.5 mmHg AV Mean Grad:      19.0 mmHg LVOT Vmax:         66.30 cm/s LVOT Vmean:        43.700 cm/s LVOT VTI:          0.155 m LVOT/AV VTI ratio: 0.26 AI PHT:            582 msec  AORTA Ao Root diam: 3.80 cm Ao Asc diam:  4.70 cm MITRAL VALVE                TRICUSPID VALVE MV Area  (PHT): 5.02 cm     TR Peak grad:   35.8 mmHg MV Area VTI:   1.78 cm     TR Vmax:        299.00 cm/s MV Peak grad:  7.6 mmHg MV  Mean grad:  3.0 mmHg     SHUNTS MV Vmax:       1.38 m/s     Systemic VTI:  0.16 m MV Vmean:      85.1 cm/s    Systemic Diam: 2.10 cm MV Decel Time: 151 msec MV E velocity: 117.00 cm/s MV A velocity: 97.90 cm/s MV E/A ratio:  1.20 Redell Shallow MD Electronically signed by Redell Shallow MD Signature Date/Time: 03/30/2024/3:48:22 PM    Final (Updated)         Scheduled Meds:  apixaban   2.5 mg Oral BID   feeding supplement  237 mL Oral TID BM   finasteride   5 mg Oral Daily   furosemide   40 mg Intravenous BID   metoprolol  tartrate  25 mg Oral BID   multivitamin with minerals  1 tablet Oral Daily   rosuvastatin   10 mg Oral Daily   sodium chloride  flush  3 mL Intravenous Q12H   tamsulosin   0.4 mg Oral Daily   thiamine   100 mg Oral Daily   Continuous Infusions:  amiodarone  30 mg/hr (03/31/24 0830)     LOS: 2 days    Time spent: 45 minutes spent on chart review, discussion with nursing staff, consultants, updating family and interview/physical exam; more than 50% of that time was spent in counseling and/or coordination of care.    Harlene RAYMOND Bowl, DO [facility] Available via Epic secure chat 7am-7pm After these hours, please refer to coverage provider listed on [email or web address] 03/31/2024, 11:09 AM

## 2024-03-31 NOTE — Progress Notes (Signed)
 Rounding Note   Patient Name: Richard Reed Date of Encounter: 03/31/2024  [facility] Cardiologist: Dorn Lesches, MD   Subjective I/O's not recorded.  Creatinine stable at 1.5.  BP 92/70 this morning.  Reports dyspnea improved.  Denies chest pain   Scheduled Meds:  apixaban   2.5 mg Oral BID   feeding supplement  237 mL Oral TID BM   finasteride   5 mg Oral Daily   furosemide   40 mg Intravenous BID   metoprolol  tartrate  25 mg Oral BID   multivitamin with minerals  1 tablet Oral Daily   rosuvastatin   10 mg Oral Daily   sodium chloride  flush  3 mL Intravenous Q12H   tamsulosin   0.4 mg Oral Daily   thiamine   100 mg Oral Daily   Continuous Infusions:  amiodarone  30 mg/hr (03/31/24 0830)   PRN Meds: acetaminophen  **OR** acetaminophen , metoprolol  tartrate   Vital Signs  Vitals:   03/30/24 1953 03/31/24 0035 03/31/24 0450 03/31/24 0835  BP: 115/89 97/79 109/74 92/70  Pulse: 74 98 93 (!) 103  Resp: 17 (!) 22 (!) 21 20  Temp: 98 F (36.7 C) 98.5 F (36.9 C) 98.6 F (37 C) 98.1 F (36.7 C)  TempSrc: Oral Oral Oral Oral  SpO2: 97% 96% 95% 97%  Weight:   61 kg   Height:        Intake/Output Summary (Last 24 hours) at 03/31/2024 1104 Last data filed at 03/31/2024 0830 Gross per 24 hour  Intake 723.05 ml  Output 300 ml  Net 423.05 ml      03/31/2024    4:50 AM 03/30/2024    6:45 AM 03/29/2024    9:45 AM  Last 3 Weights  Weight (lbs) 134 lb 6.4 oz 137 lb 2 oz 135 lb  Weight (kg) 60.963 kg 62.2 kg 61.236 kg      Telemetry Currently in A-fib in the 120s to 130s, overnight intermittently in sinus- Personally Reviewed  ECG  No new ECG- Personally Reviewed  Physical Exam  GEN: No acute distress.   Neck: No JVD Cardiac: Irregular rhythm, tachycardic, 2/6 systolic murmur Respiratory: Diminished breath sounds GI: Soft, nontender, non-distended  MS: Trace edema; No deformity. Neuro:  Nonfocal  Psych: Normal affect   Labs High Sensitivity  Troponin:   Recent Labs  Lab 03/29/24 0955 03/29/24 1143 03/29/24 1500  TROPONINIHS 75* 445* 906*   No results for input(s): TRNPT in the last 720 hours.     Chemistry Recent Labs  Lab 03/29/24 0955 03/29/24 1012 03/30/24 0349 03/31/24 0538  NA 141 143 140 137  K 3.2* 3.7 4.5 3.9  CL 112* 108 108 103  CO2 19*  --  27 23  GLUCOSE 104* 112* 106* 87  BUN 31* 35* 33* 29*  CREATININE 1.30* 1.60* 1.58* 1.53*  CALCIUM  7.0*  --  8.8* 8.3*  MG 1.8  --  2.1 2.0  PROT 5.0*  --   --   --   ALBUMIN 3.0*  --   --   --   AST 24  --   --   --   ALT 33  --   --   --   ALKPHOS 53  --   --   --   BILITOT 1.3*  --   --   --   GFRNONAA 50*  --  40* 41*  ANIONGAP 10  --  5 11    Lipids No results for input(s): CHOL, TRIG, HDL, LABVLDL, LDLCALC, CHOLHDL  in the last 168 hours.  Hematology Recent Labs  Lab 03/29/24 0955 03/29/24 1012 03/31/24 0538  WBC 4.7  --  4.3  RBC 4.16*  --  4.23  HGB 13.5 13.6 13.9  HCT 40.9 40.0 40.6  MCV 98.3  --  96.0  MCH 32.5  --  32.9  MCHC 33.0  --  34.2  RDW 14.4  --  14.6  PLT 116*  --  108*   Thyroid   Recent Labs  Lab 03/29/24 0955  TSH 2.481    BNP Recent Labs  Lab 03/29/24 0955  BNP 1,018.0*    DDimer No results for input(s): DDIMER in the last 168 hours.   Radiology  ECHOCARDIOGRAM COMPLETE Result Date: 03/30/2024    ECHOCARDIOGRAM REPORT   Patient Name:   Richard Reed Date of Exam: 03/30/2024 Medical Rec #:  [id number]      Height:       69.0 in Accession #:    [id number]     Weight:       137.1 lb Date of Birth:  10-09-1925      BSA:          1.760 m Patient Age:    89 years       BP:           113/83 mmHg Patient Gender: M              HR:           85 bpm. Exam Location:  Inpatient Procedure: 2D Echo, Cardiac Doppler and Color Doppler (Both Spectral and Color            Flow Doppler were utilized during procedure).                              MODIFIED REPORT: This report was modified by Redell Shallow MD on  03/30/2024 due to Changes.  Indications:     CHF Acute Systolic I50.21  History:         Patient has prior history of Echocardiogram examinations, most                  recent 09/30/2019. Aortic Valve Disease; Arrythmias:Atrial                  Fibrillation.  Sonographer:     Nathanel Devonshire Referring Phys:  [id number] ISAIAH CROME ELLIS Diagnosing Phys: Redell Shallow MD IMPRESSIONS  1. Global hypokinesis with akinesis of the inferolateral wall; overall severe LV dysfunction.  2. Left ventricular ejection fraction, by estimation, is 25 to 30%. The left ventricle has severely decreased function. The left ventricle demonstrates regional wall motion abnormalities (see scoring diagram/findings for description). Left ventricular diastolic parameters are consistent with Grade II diastolic dysfunction (pseudonormalization). Elevated left atrial pressure.  3. Right ventricular systolic function is normal. The right ventricular size is normal. There is mildly elevated pulmonary artery systolic pressure.  4. Left atrial size was moderately dilated.  5. Right atrial size was moderately dilated.  6. The mitral valve is normal in structure. Mild mitral valve regurgitation. No evidence of mitral stenosis.  7. The aortic valve is tricuspid. Aortic valve regurgitation is mild. Severe aortic valve stenosis.  8. There is moderate dilatation of the ascending aorta, measuring 47 mm.  9. The inferior vena cava is normal in size with greater than 50% respiratory variability, suggesting right atrial pressure of 3 mmHg. Conclusion(s)/Recommendation(s): No evidence of  valvular vegetations on this transthoracic echocardiogram. Consider a transesophageal echocardiogram to exclude infective endocarditis if clinically indicated. FINDINGS  Left Ventricle: Left ventricular ejection fraction, by estimation, is 25 to 30%. The left ventricle has severely decreased function. The left ventricle demonstrates regional wall motion abnormalities. The left ventricular  internal cavity size was normal  in size. There is no left ventricular hypertrophy. Left ventricular diastolic parameters are consistent with Grade II diastolic dysfunction (pseudonormalization). Elevated left atrial pressure. Right Ventricle: The right ventricular size is normal. Right ventricular systolic function is normal. There is mildly elevated pulmonary artery systolic pressure. The tricuspid regurgitant velocity is 2.99 m/s, and with an assumed right atrial pressure of 3 mmHg, the estimated right ventricular systolic pressure is 38.8 mmHg. Left Atrium: Left atrial size was moderately dilated. Right Atrium: Right atrial size was moderately dilated. Pericardium: There is no evidence of pericardial effusion. Mitral Valve: The mitral valve is normal in structure. Mild mitral annular calcification. Mild mitral valve regurgitation. No evidence of mitral valve stenosis. MV peak gradient, 7.6 mmHg. The mean mitral valve gradient is 3.0 mmHg. Tricuspid Valve: The tricuspid valve is normal in structure. Tricuspid valve regurgitation is mild . No evidence of tricuspid stenosis. Aortic Valve: The aortic valve is tricuspid. Aortic valve regurgitation is mild. Aortic regurgitation PHT measures 582 msec. Severe aortic stenosis is present. Aortic valve mean gradient measures 19.0 mmHg. Aortic valve peak gradient measures 29.5 mmHg. Aortic valve area, by VTI measures 0.91 cm. Pulmonic Valve: The pulmonic valve was normal in structure. Pulmonic valve regurgitation is trivial. No evidence of pulmonic stenosis. Aorta: The aortic root is normal in size and structure. There is moderate dilatation of the ascending aorta, measuring 47 mm. Venous: The inferior vena cava is normal in size with greater than 50% respiratory variability, suggesting right atrial pressure of 3 mmHg. IAS/Shunts: No atrial level shunt detected by color flow Doppler. Additional Comments: Global hypokinesis with akinesis of the inferolateral wall; overall  severe LV dysfunction.  LEFT VENTRICLE PLAX 2D LVIDd:         5.00 cm      Diastology LVIDs:         4.20 cm      LV e' medial:    3.16 cm/s LV PW:         1.00 cm      LV E/e' medial:  37.1 LV IVS:        1.20 cm      LV e' lateral:   5.44 cm/s LVOT diam:     2.10 cm      LV E/e' lateral: 21.5 LV SV:         54 LV SV Index:   31 LVOT Area:     3.46 cm  LV Volumes (MOD) LV vol d, MOD A2C: 146.0 ml LV vol d, MOD A4C: 108.0 ml LV vol s, MOD A2C: 83.1 ml LV vol s, MOD A4C: 71.4 ml LV SV MOD A2C:     62.9 ml LV SV MOD A4C:     108.0 ml LV SV MOD BP:      48.7 ml RIGHT VENTRICLE            IVC RV Basal diam:  3.60 cm    IVC diam: 1.80 cm RV S prime:     8.27 cm/s TAPSE (M-mode): 1.4 cm LEFT ATRIUM             Index        RIGHT ATRIUM  Index LA diam:        3.50 cm 1.99 cm/m   RA Area:     20.50 cm LA Vol (A2C):   91.5 ml 51.99 ml/m  RA Volume:   68.10 ml  38.70 ml/m LA Vol (A4C):   60.9 ml 34.61 ml/m LA Biplane Vol: 76.4 ml 43.41 ml/m  AORTIC VALVE AV Area (Vmax):    0.85 cm AV Area (Vmean):   0.73 cm AV Area (VTI):     0.91 cm AV Vmax:           271.50 cm/s AV Vmean:          206.000 cm/s AV VTI:            0.592 m AV Peak Grad:      29.5 mmHg AV Mean Grad:      19.0 mmHg LVOT Vmax:         66.30 cm/s LVOT Vmean:        43.700 cm/s LVOT VTI:          0.155 m LVOT/AV VTI ratio: 0.26 AI PHT:            582 msec  AORTA Ao Root diam: 3.80 cm Ao Asc diam:  4.70 cm MITRAL VALVE                TRICUSPID VALVE MV Area (PHT): 5.02 cm     TR Peak grad:   35.8 mmHg MV Area VTI:   1.78 cm     TR Vmax:        299.00 cm/s MV Peak grad:  7.6 mmHg MV Mean grad:  3.0 mmHg     SHUNTS MV Vmax:       1.38 m/s     Systemic VTI:  0.16 m MV Vmean:      85.1 cm/s    Systemic Diam: 2.10 cm MV Decel Time: 151 msec MV E velocity: 117.00 cm/s MV A velocity: 97.90 cm/s MV E/A ratio:  1.20 Redell Shallow MD Electronically signed by Redell Shallow MD Signature Date/Time: 03/30/2024/3:48:22 PM    Final (Updated)     Cardiac  Studies   Patient Profile   89 y.o. male with a hx of CAD, PAF, HTN, HLD, HFrEF (Reportedly 30% at hospital in [place] ), moderate aortic insufficiency who is being seen 03/29/2024 for the evaluation of chest pain and A-fib with RVR   Assessment & Plan  Acute on chronic combined heart failure: Echo with EF 25 to 30%, G2DD, normal RV function, severe aortic stenosis - Volume status appears improved, received IV Lasix  this morning, will transition to p.o. Lasix  tomorrow - Strict I's/O's and daily weights  A-fib with RVR: Could be contributing to his decompensated heart failure.  Currently having intermittent sinus beats followed by atrial runs - Continue home Eliquis  2.5 mg twice daily - Started on amiodarone  drip - Added metoprolol  25 mg twice daily, no room to titrate with BP  VT: Had episode of sustained VT when being transported by EMS, became unresponsive and received shock x 1.  Started on amiodarone  and metoprolol  as above.  Would not pursue ICD given age  Elevated troponin: 75 > 445 > 906.  Continue Eliquis , statin.  Anticipate conservative management given age and renal function.  D/w patient and daughter and in agreement with not pursuing invasive procedures  Aortic stenosis: Echo appears low-flow low gradient severe aortic stenosis.  Complicated situation with age and comorbidities.  Daughter not at bedside today, tried  calling to give update but no answer.  When I spoke with daughter and patient yesterday about elevated troponin, they were in agreement with not pursuing invasive procedures, which is very reasonable.  Will need to have further discussion given severe aortic stenosis, but suspect would not pursue invasive procedures  CKD: Creatinine 1.5, appears about baseline.  Monitor with diuresis   For questions or updates, please contact [facility] Please consult [email or web address] for contact info under     Signed, Lonni LITTIE Nanas, MD  03/31/2024, 11:04 AM

## 2024-03-31 NOTE — Progress Notes (Signed)
 OT Cancellation Note  Patient Details Name: Richard Reed MRN: [id number] DOB: 01/26/1926   Cancelled Treatment:    Reason Eval/Treat Not Completed: Medical issues which prohibited therapy (tachycardic, HR 120-140 with EOB)  Richard Reed, OTD, OTR/L [facility] [phone]   [name] 03/31/2024, 9:04 AM

## 2024-03-31 NOTE — Progress Notes (Signed)
 [facility] night cross cover note:   I was notified by RN of the patient's request for a sleep aid. I subsequently placed order for prn melatonin for insomnia.     Newton Pigg, DO Hospitalist

## 2024-03-31 NOTE — Progress Notes (Signed)
 PT Cancellation Note  Patient Details Name: Richard Reed MRN: [id number] DOB: [birth year]   Cancelled Treatment:    Reason Eval/Treat Not Completed: Medical issues which prohibited therapy. Continued tachycardic at rest 100-110, increases to 120-140 with limited bed mobility. PT will re-attempt when pt is medically appropriate.   Isaiah DEL. Kalani Sthilaire, PT, DPT  [employer] 03/31/2024, 10:54 AM

## 2024-03-31 NOTE — Plan of Care (Signed)

## 2024-04-01 ENCOUNTER — Telehealth: Payer: Self-pay | Admitting: Cardiovascular Disease

## 2024-04-01 DIAGNOSIS — I472 Ventricular tachycardia, unspecified: Secondary | ICD-10-CM

## 2024-04-01 DIAGNOSIS — I4891 Unspecified atrial fibrillation: Secondary | ICD-10-CM | POA: Diagnosis not present

## 2024-04-01 LAB — CBC
HCT: 40.3 % (ref 39.0–52.0)
Hemoglobin: 13.8 g/dL (ref 13.0–17.0)
MCH: 32.9 pg (ref 26.0–34.0)
MCHC: 34.2 g/dL (ref 30.0–36.0)
MCV: 96 fL (ref 80.0–100.0)
Platelets: 107 K/uL — ABNORMAL LOW (ref 150–400)
RBC: 4.2 MIL/uL — ABNORMAL LOW (ref 4.22–5.81)
RDW: 14.4 % (ref 11.5–15.5)
WBC: 4.2 K/uL (ref 4.0–10.5)
nRBC: 0 % (ref 0.0–0.2)

## 2024-04-01 LAB — BASIC METABOLIC PANEL WITH GFR
Anion gap: 11 (ref 5–15)
BUN: 36 mg/dL — ABNORMAL HIGH (ref 8–23)
CO2: 23 mmol/L (ref 22–32)
Calcium: 8.5 mg/dL — ABNORMAL LOW (ref 8.9–10.3)
Chloride: 103 mmol/L (ref 98–111)
Creatinine, Ser: 1.77 mg/dL — ABNORMAL HIGH (ref 0.61–1.24)
GFR, Estimated: 35 mL/min — ABNORMAL LOW (ref >60)
Glucose, Bld: 90 mg/dL (ref 70–99)
Potassium: 3.6 mmol/L (ref 3.5–5.1)
Sodium: 137 mmol/L (ref 135–145)

## 2024-04-01 LAB — MAGNESIUM: Magnesium: 1.9 mg/dL (ref 1.7–2.4)

## 2024-04-01 MED ORDER — METOPROLOL SUCCINATE ER 25 MG PO TB24
25.0000 mg | ORAL_TABLET | Freq: Two times a day (BID) | ORAL | Status: DC
  Administered 2024-04-01 – 2024-04-02 (×4): 25 mg via ORAL
  Filled 2024-04-01 (×4): qty 1

## 2024-04-01 NOTE — Telephone Encounter (Signed)
 Pt daughter calling with updates about patient hospitalization.   Inpatient cardiologist informed daughter this morning that he has a tight aortic valve, added Amiodarone  IV infusion per Darice, daughter. Also stated pt was tachycardic with activity this am, patient HR 100-120's. However she says he recovers quickly. Daughter wanting Dr. Court opinion on situation that is going on and wanting him to be aware. Hospitalist informed daughter and patient that he would need additional assistance at discharge and would possibly palliative care. Breathing has improved and HR improving, per daughter. Also states his afib and HR are improving, but he still gets tachy when walking with therapy. Will send to Dr. Court and his nurse for any further recommendations. Daughter verbalizes understanding.

## 2024-04-01 NOTE — Telephone Encounter (Signed)
 Spoke with patient's daughter Darice and shared message from Dr. Court: Let family know that I am aware and that one of my partners is managing him while he is in the hospital.   Darice verbalized understanding and expressed appreciation for follow-up.  Darice states she was speaking with someone about assistance for Eliquis . Reviewed chart and saw telephone exchange documented between Rx Med Assistance team member. Will forward message over to this pool to have a team member reach out to Darice about Eliquis  patient assistance.

## 2024-04-01 NOTE — Evaluation (Signed)
 Physical Therapy Evaluation Patient Details Name: Richard Reed MRN: [id number] DOB: Jul 10, 1925 Today's Date: 04/01/2024  History of Present Illness  89 yo M adm 11/13 afib with RVR with ventricular tachycardia and acute HFrEF exacerbation. PMH PAF on eliquis , prostate CA, CAD, CKD III, vertebrobasilar stenosis, HTN, hyslipidemia, BPH, and HFrER.  Clinical Impression  Prior to admission, patient was living alone at [facility] and uses a RW for mobility. He currently requires CGA-MIN A for all functional mobility tasks with close monitoring of HR as it continues to elevate during mobility tasks. With ambulation of 150ft, HR increased to 115. See below for additional deficits. Pt is motivated to improve his mobility and strength, and will benefit from continued therapy while in acute hospital. PT recommends follow up with HHPT.  Pt will need a RW to return home as his is old and rickety per daugher and at least 89 years old.        If plan is discharge home, recommend the following: A little help with walking and/or transfers;A little help with bathing/dressing/bathroom;Assist for transportation   Can travel by private vehicle        Equipment Recommendations Rolling walker (2 wheels)  Recommendations for Other Services       Functional Status Assessment Patient has had a recent decline in their functional status and demonstrates the ability to make significant improvements in function in a reasonable and predictable amount of time.     Precautions / Restrictions Precautions Precautions: None Recall of Precautions/Restrictions: Intact Restrictions Weight Bearing Restrictions Per Provider Order: No      Mobility  Bed Mobility Overal bed mobility: Needs Assistance Bed Mobility: Supine to Sit, Sit to Supine     Supine to sit: HOB elevated, Used rails, Contact guard Sit to supine: HOB elevated, Used rails, Contact guard assist   General bed mobility comments:  increased time to complete    Transfers Overall transfer level: Needs assistance Equipment used: Rolling walker (2 wheels) Transfers: Sit to/from Stand Sit to Stand: Contact guard assist           General transfer comment: VC for hand placement    Ambulation/Gait Ambulation/Gait assistance: Min assist Gait Distance (Feet): 100 Feet Assistive device: Rolling walker (2 wheels) Gait Pattern/deviations: Narrow base of support, Step-through pattern, Decreased step length - right, Decreased step length - left Gait velocity: decreased     General Gait Details: slow speed, no LOB; HR with mobility 98-115, no SOB reported  Stairs            Wheelchair Mobility     Tilt Bed    Modified Rankin (Stroke Patients Only)       Balance Overall balance assessment: Needs assistance Sitting-balance support: Feet supported Sitting balance-Leahy Scale: Good   Postural control: Posterior lean Standing balance support: During functional activity, Reliant on assistive device for balance Standing balance-Leahy Scale: Fair                               Pertinent Vitals/Pain Pain Assessment Pain Assessment: No/denies pain    Home Living Family/patient expects to be discharged to:: Private residence [facility]) Living Arrangements: Alone Available Help at Discharge: Family;Available PRN/intermittently;Other (Comment) (staff available at [facility]) Type of Home: House Home Access: Level entry;Elevator       Home Layout: One level Home Equipment: [employer] (2 wheels);Rollator (4 wheels);Grab bars - tub/shower Additional Comments: pt MOD I  with mobility and ADLs at [facility], uses elevator to get down to dining hall, no reports of fall, daughter provides transportation to appointments    Prior Function Prior Level of Function : Independent/Modified Independent             Mobility Comments: pt uses RW to ambulation, pt's daughter  states the RW is very old, like 20 years and he would benefit from a new one ADLs Comments: IND     Extremity/Trunk Assessment   Upper Extremity Assessment Upper Extremity Assessment: Defer to OT evaluation    Lower Extremity Assessment Lower Extremity Assessment: Generalized weakness;Overall [facility] for tasks assessed    Cervical / Trunk Assessment Cervical / Trunk Assessment: Normal  Communication   Communication Communication: Impaired (HOH, relies on lip reading) Factors Affecting Communication: Hearing impaired;Other (comment) (very HOH, per daughter 0% hear in L ear, 4% hearing in R ear; has hearing aids)    Cognition Arousal: Alert Behavior During Therapy: WFL for tasks assessed/performed   PT - Cognitive impairments: No apparent impairments                         Following commands: Intact       Cueing Cueing Techniques: Verbal cues, Gestural cues, Tactile cues     General Comments      Exercises     Assessment/Plan    PT Assessment Patient needs continued PT services  PT Problem List Cardiopulmonary status limiting activity;Decreased strength;Decreased activity tolerance;Decreased balance;Decreased mobility       PT Treatment Interventions Gait training;Balance training;Functional mobility training;Therapeutic exercise;Therapeutic activities;Patient/family education    PT Goals (Current goals can be found in the Care Plan section)  Acute Rehab PT Goals Patient Stated Goal: get stronger PT Goal Formulation: With patient/family Time For Goal Achievement: 04/15/24 Potential to Achieve Goals: Good    Frequency Min 2X/week     Co-evaluation               AM-PAC PT 6 Clicks Mobility  Outcome Measure Help needed turning from your back to your side while in a flat bed without using bedrails?: A Little Help needed moving from lying on your back to sitting on the side of a flat bed without using bedrails?: A Little Help needed moving  to and from a bed to a chair (including a wheelchair)?: A Little Help needed standing up from a chair using your arms (e.g., wheelchair or bedside chair)?: A Little Help needed to walk in hospital room?: A Little Help needed climbing 3-5 steps with a railing? : A Lot 6 Click Score: 17    End of Session Equipment Utilized During Treatment: Gait belt Activity Tolerance: Patient tolerated treatment well;Other (comment) (limited by elevated HR) Patient left: in bed;with family/visitor present;with call bell/phone within reach;with bed alarm set Nurse Communication: Mobility status PT Visit Diagnosis: Unsteadiness on feet (R26.81);Muscle weakness (generalized) (M62.81)    Time: 8976-8944 PT Time Calculation (min) (ACUTE ONLY): 32 min   Charges:   PT Evaluation $PT Eval Moderate Complexity: 1 Mod PT Treatments $Gait Training: 8-22 mins           Isaiah DEL. Marton Malizia, PT, DPT   [employer] 04/01/2024, 12:13 PM

## 2024-04-01 NOTE — Plan of Care (Signed)

## 2024-04-01 NOTE — Progress Notes (Addendum)
 Progress Note  Patient Name: Richard Reed Date of Encounter: 04/01/2024 [facility] Cardiologist: Dorn Lesches, MD   Interval Summary   Reported that he had a brief episode of chest discomfort that radiated to his back.  Stated that this was the same discomfort that initially brought him in.  Vital Signs Vitals:   04/01/24 0002 04/01/24 0357 04/01/24 0412 04/01/24 0823  BP: 103/69 (!) 95/59  113/74  Pulse: 68 68  (!) 107  Resp: 16 18  18   Temp: 97.6 F (36.4 C) 98 F (36.7 C)  97.7 F (36.5 C)  TempSrc: Oral Oral  Oral  SpO2: 100% 93%  94%  Weight:   60.9 kg   Height:        Intake/Output Summary (Last 24 hours) at 04/01/2024 0831 Last data filed at 04/01/2024 0000 Gross per 24 hour  Intake 257.58 ml  Output --  Net 257.58 ml      04/01/2024    4:12 AM 03/31/2024    4:50 AM 03/30/2024    6:45 AM  Last 3 Weights  Weight (lbs) 134 lb 4.8 oz 134 lb 6.4 oz 137 lb 2 oz  Weight (kg) 60.918 kg 60.963 kg 62.2 kg      Telemetry/ECG  MAT with heart rates in the 70s to 110s.- Personally Reviewed  Physical Exam  GEN: No acute distress.  Alert and orientated on room air.  Is hard of hearing. Neck: No JVD Cardiac: Irregularly irregular rhythm, no murmurs, rubs, or gallops.  Respiratory: Bibasilar crackles. GI: Soft, nontender, non-distended  MS: No edema  Assessment & Plan   89 y.o. male with a hx of CAD, CKD, PAF, HTN, HLD, HFrEF (Reportedly 30% at hospital in [place] ), moderate aortic insufficiency who is being seen 03/29/2024 for the evaluation of chest pain and A-fib with RVR.  Acute on chronic HFrEF Ascending aortic dilation Severe low-flow low gradient aortic stenosis TTE on 03/30/2024 showed a reduced LVEF of 25 to 30%, G2 DD, normal RV systolic function, moderately dilated left and right atrium, severe aortic valve stenosis (mean gradient 19 mmHg, AV area by VTI 0.91 cm), RV systolic pressure estimated at 38.8 mmHg, and ascending aorta dilation  measuring 47 mm. Received several doses of IV Lasix  and was transition to oral Lasix . Continue Lasix  40 mg daily Patient does not want to proceed with any procedures.  I also called and spoke with Darice Sheffield and she is also in agreement to not proceed with any procedures.  She did request for Dr. Floretta to also call her. GDMT Limited by hypotension.  Will prioritize rate control as per A-fib below.   A-fib with RVR Could be contributing to heart failure. Was started on IV amiodarone . Heart rates on telemetry are in the 70s to 110s and underlying rhythm appears to be MAT. Continue Eliquis  2.5 mg twice daily. Has received about 2.3 g of IV amiodarone .  Continue IV amiodarone . Plan to change to metoprolol  succinate 25 mg twice daily.    Sustained VT Had an episode of sustained VT when was being transported by EMS.  Would like to not pursue ICD given age.  On amiodarone  and metoprolol  as above.   Elevated troponins 75 > 445 > 906 In the setting of sustained VT and systolic heart failure as mentioned above.  Would like to manage conservatively and does not want to pursue invasive procedures.   CKD stage IIIb Creatinine this morning was 1.77.  This is slightly worse than yesterday when  it was 1.53.     For questions or updates, please contact [facility] Please consult [email or web address] for contact info under         Signed, Madyson Lukach, PA-C

## 2024-04-01 NOTE — Progress Notes (Signed)
 PROGRESS NOTE    Richard Reed  FMW:[id number] DOB: 1925/10/20 DOA: 03/29/2024 PCP: Marvene Prentice SAUNDERS, FNP    Brief Narrative:  Richard Reed is a 89 y.o. male from ILF with medical history significant of PAF on low-dose Eliquis , history of prostate cancer, CAD involving the RCA medical therapy only with cardiac cath in 2012, chronic kidney disease stage III known vertebrobasilar stenosis, hypertension, dyslipidemia, BPH on Flomax ,HFrEF with a component of diastolic dysfunction, mild to moderate MR, moderate AR, moderate AS.  He developed abrupt onset of midsternal chest pain that radiated to the back this was associated with severe nausea diaphoresis as well as shortness of breath.  EMS was subsequently called and patient came to the emergency room. Upon presentation to the ED he was in A-fib with RVR with ventricular responses in the 130-140 range.  Cardiologist consulted    Assessment and Plan: Atrial fibrillation with RVR Ventricular tachycardia Cardiologist on board.  Appreciate input Currently on amiodarone  drip Patient also had episodes of ventricular tachycardia in the field requiring defibrillation 360 J x 1. Continue home Eliquis  Keep magnesium  and potassium within normal limits Metoprolol  added  by cardiologist   Acute exacerbation of HFrEF Diastolic dysfunction Known mild to moderate MR, moderate AR, and moderate AS per echo 2021 Suspect secondary to at least one week of RVR based on history given by patient and family Continue IV Lasix  40 mg twice daily with potassium supplementation-follow electrolyte Holding calcium  channel blocker  -echocardiogram: Left ventricular ejection fraction, by estimation, is 25 to 30%. severely decreased function.  Grade II diastolic dysfunction  Strict intake and output and daily weights   CAD with RCA 40 to 60% ostial in 2012, medically managed Continue statin therapy as well as metoprolol    Hypertension Continue current  antihypertensives about   CKD stage IIIa Baseline creatinine was 1.15 in January 2024 and in March 2025 was 1.54 -creatinine baseline is 1.5    Thrombocytopenia Documented in March 2024 with platelets 119,000 No signs of bleeding Continue to follow   Known vertebrobasilar stenosis Currently asymptomatic   History of prostate cancer/BPH Continue Flomax    HLD Continue Crestor    Protein calorie malnutrition Last known weight was 61.2 kg with a BMI of 19.9 Nutrition consultation Ensure supplementation   Physical deconditioning According to family patient will walk from his room at the ILF to the common area where he sits on the couch most days PT/OT evaluation   Palliative care consultation, family aware and in agreement after discussion with cardiology     DVT prophylaxis: apixaban  (ELIQUIS ) tablet 2.5 mg Start: 03/29/24 1600 apixaban  (ELIQUIS ) tablet 2.5 mg    Code Status: Full Code   Disposition Plan:  Level of care: Progressive Status is: Inpatient     Consultants:  cards   Subjective: Doing well, no current complaints  Objective: Vitals:   04/01/24 0357 04/01/24 0412 04/01/24 0823 04/01/24 0824  BP: (!) 95/59  113/74 113/74  Pulse: 68  (!) 107   Resp: 18  18   Temp: 98 F (36.7 C)  97.7 F (36.5 C)   TempSrc: Oral  Oral   SpO2: 93%  94%   Weight:  60.9 kg    Height:        Intake/Output Summary (Last 24 hours) at 04/01/2024 1350 Last data filed at 04/01/2024 0000 Gross per 24 hour  Intake 257.58 ml  Output --  Net 257.58 ml   Filed Weights   03/30/24 0645 03/31/24 0450 04/01/24 9587  Weight: 62.2 kg 61 kg 60.9 kg    Examination:   General: Appearance:    Thin male in no acute distress     Lungs:     respirations unlabored  Heart:    Tachycardic. irreg     MS:   All extremities are intact.    Neurologic:   Awake, alert       Data Reviewed: I have personally reviewed following labs and imaging studies  CBC: Recent Labs   Lab 03/29/24 0955 03/29/24 1012 03/31/24 0538 04/01/24 0547  WBC 4.7  --  4.3 4.2  NEUTROABS 3.5  --   --   --   HGB 13.5 13.6 13.9 13.8  HCT 40.9 40.0 40.6 40.3  MCV 98.3  --  96.0 96.0  PLT 116*  --  108* 107*   Basic Metabolic Panel: Recent Labs  Lab 03/29/24 0955 03/29/24 1012 03/30/24 0349 03/31/24 0538 04/01/24 0547  NA 141 143 140 137 137  K 3.2* 3.7 4.5 3.9 3.6  CL 112* 108 108 103 103  CO2 19*  --  27 23 23   GLUCOSE 104* 112* 106* 87 90  BUN 31* 35* 33* 29* 36*  CREATININE 1.30* 1.60* 1.58* 1.53* 1.77*  CALCIUM  7.0*  --  8.8* 8.3* 8.5*  MG 1.8  --  2.1 2.0 1.9   GFR: Estimated Creatinine Clearance: 21 mL/min (A) (by C-G formula based on SCr of 1.77 mg/dL (H)). Liver Function Tests: Recent Labs  Lab 03/29/24 0955  AST 24  ALT 33  ALKPHOS 53  BILITOT 1.3*  PROT 5.0*  ALBUMIN 3.0*   No results for input(s): LIPASE, AMYLASE in the last 168 hours. No results for input(s): AMMONIA in the last 168 hours. Coagulation Profile: No results for input(s): INR, PROTIME in the last 168 hours. Cardiac Enzymes: No results for input(s): CKTOTAL, CKMB, CKMBINDEX, TROPONINI in the last 168 hours. BNP (last 3 results) No results for input(s): PROBNP in the last 8760 hours. HbA1C: No results for input(s): HGBA1C in the last 72 hours. CBG: No results for input(s): GLUCAP in the last 168 hours. Lipid Profile: No results for input(s): CHOL, HDL, LDLCALC, TRIG, CHOLHDL, LDLDIRECT in the last 72 hours. Thyroid  Function Tests: No results for input(s): TSH, T4TOTAL, FREET4, T3FREE, THYROIDAB in the last 72 hours.  Anemia Panel: No results for input(s): VITAMINB12, FOLATE, FERRITIN, TIBC, IRON, RETICCTPCT in the last 72 hours. Sepsis Labs: Recent Labs  Lab 03/29/24 1013 03/29/24 1150  LATICACIDVEN 2.0* 1.4    No results found for this or any previous visit (from the past 240 hours).       Radiology  Studies: ECHOCARDIOGRAM COMPLETE Result Date: 03/30/2024    ECHOCARDIOGRAM REPORT   Patient Name:   Richard Reed Date of Exam: 03/30/2024 Medical Rec #:  [id number]      Height:       69.0 in Accession #:    [id number]     Weight:       137.1 lb Date of Birth:  [birth year]      BSA:          1.760 m Patient Age:    89 years       BP:           113/83 mmHg Patient Gender: M              HR:           85 bpm. Exam Location:  Inpatient Procedure: 2D Echo, Cardiac  Doppler and Color Doppler (Both Spectral and Color            Flow Doppler were utilized during procedure).                              MODIFIED REPORT: This report was modified by Redell Shallow MD on 03/30/2024 due to Changes.  Indications:     CHF Acute Systolic I50.21  History:         Patient has prior history of Echocardiogram examinations, most                  recent 09/30/2019. Aortic Valve Disease; Arrythmias:Atrial                  Fibrillation.  Sonographer:     Nathanel Devonshire Referring Phys:  [id number] ISAIAH CROME ELLIS Diagnosing Phys: Redell Shallow MD IMPRESSIONS  1. Global hypokinesis with akinesis of the inferolateral wall; overall severe LV dysfunction.  2. Left ventricular ejection fraction, by estimation, is 25 to 30%. The left ventricle has severely decreased function. The left ventricle demonstrates regional wall motion abnormalities (see scoring diagram/findings for description). Left ventricular diastolic parameters are consistent with Grade II diastolic dysfunction (pseudonormalization). Elevated left atrial pressure.  3. Right ventricular systolic function is normal. The right ventricular size is normal. There is mildly elevated pulmonary artery systolic pressure.  4. Left atrial size was moderately dilated.  5. Right atrial size was moderately dilated.  6. The mitral valve is normal in structure. Mild mitral valve regurgitation. No evidence of mitral stenosis.  7. The aortic valve is tricuspid. Aortic valve regurgitation is mild. Severe  aortic valve stenosis.  8. There is moderate dilatation of the ascending aorta, measuring 47 mm.  9. The inferior vena cava is normal in size with greater than 50% respiratory variability, suggesting right atrial pressure of 3 mmHg. Conclusion(s)/Recommendation(s): No evidence of valvular vegetations on this transthoracic echocardiogram. Consider a transesophageal echocardiogram to exclude infective endocarditis if clinically indicated. FINDINGS  Left Ventricle: Left ventricular ejection fraction, by estimation, is 25 to 30%. The left ventricle has severely decreased function. The left ventricle demonstrates regional wall motion abnormalities. The left ventricular internal cavity size was normal  in size. There is no left ventricular hypertrophy. Left ventricular diastolic parameters are consistent with Grade II diastolic dysfunction (pseudonormalization). Elevated left atrial pressure. Right Ventricle: The right ventricular size is normal. Right ventricular systolic function is normal. There is mildly elevated pulmonary artery systolic pressure. The tricuspid regurgitant velocity is 2.99 m/s, and with an assumed right atrial pressure of 3 mmHg, the estimated right ventricular systolic pressure is 38.8 mmHg. Left Atrium: Left atrial size was moderately dilated. Right Atrium: Right atrial size was moderately dilated. Pericardium: There is no evidence of pericardial effusion. Mitral Valve: The mitral valve is normal in structure. Mild mitral annular calcification. Mild mitral valve regurgitation. No evidence of mitral valve stenosis. MV peak gradient, 7.6 mmHg. The mean mitral valve gradient is 3.0 mmHg. Tricuspid Valve: The tricuspid valve is normal in structure. Tricuspid valve regurgitation is mild . No evidence of tricuspid stenosis. Aortic Valve: The aortic valve is tricuspid. Aortic valve regurgitation is mild. Aortic regurgitation PHT measures 582 msec. Severe aortic stenosis is present. Aortic valve mean  gradient measures 19.0 mmHg. Aortic valve peak gradient measures 29.5 mmHg. Aortic valve area, by VTI measures 0.91 cm. Pulmonic Valve: The pulmonic valve was normal in structure. Pulmonic valve regurgitation is  trivial. No evidence of pulmonic stenosis. Aorta: The aortic root is normal in size and structure. There is moderate dilatation of the ascending aorta, measuring 47 mm. Venous: The inferior vena cava is normal in size with greater than 50% respiratory variability, suggesting right atrial pressure of 3 mmHg. IAS/Shunts: No atrial level shunt detected by color flow Doppler. Additional Comments: Global hypokinesis with akinesis of the inferolateral wall; overall severe LV dysfunction.  LEFT VENTRICLE PLAX 2D LVIDd:         5.00 cm      Diastology LVIDs:         4.20 cm      LV e' medial:    3.16 cm/s LV PW:         1.00 cm      LV E/e' medial:  37.1 LV IVS:        1.20 cm      LV e' lateral:   5.44 cm/s LVOT diam:     2.10 cm      LV E/e' lateral: 21.5 LV SV:         54 LV SV Index:   31 LVOT Area:     3.46 cm  LV Volumes (MOD) LV vol d, MOD A2C: 146.0 ml LV vol d, MOD A4C: 108.0 ml LV vol s, MOD A2C: 83.1 ml LV vol s, MOD A4C: 71.4 ml LV SV MOD A2C:     62.9 ml LV SV MOD A4C:     108.0 ml LV SV MOD BP:      48.7 ml RIGHT VENTRICLE            IVC RV Basal diam:  3.60 cm    IVC diam: 1.80 cm RV S prime:     8.27 cm/s TAPSE (M-mode): 1.4 cm LEFT ATRIUM             Index        RIGHT ATRIUM           Index LA diam:        3.50 cm 1.99 cm/m   RA Area:     20.50 cm LA Vol (A2C):   91.5 ml 51.99 ml/m  RA Volume:   68.10 ml  38.70 ml/m LA Vol (A4C):   60.9 ml 34.61 ml/m LA Biplane Vol: 76.4 ml 43.41 ml/m  AORTIC VALVE AV Area (Vmax):    0.85 cm AV Area (Vmean):   0.73 cm AV Area (VTI):     0.91 cm AV Vmax:           271.50 cm/s AV Vmean:          206.000 cm/s AV VTI:            0.592 m AV Peak Grad:      29.5 mmHg AV Mean Grad:      19.0 mmHg LVOT Vmax:         66.30 cm/s LVOT Vmean:        43.700 cm/s LVOT  VTI:          0.155 m LVOT/AV VTI ratio: 0.26 AI PHT:            582 msec  AORTA Ao Root diam: 3.80 cm Ao Asc diam:  4.70 cm MITRAL VALVE                TRICUSPID VALVE MV Area (PHT): 5.02 cm     TR Peak grad:   35.8 mmHg MV Area VTI:  1.78 cm     TR Vmax:        299.00 cm/s MV Peak grad:  7.6 mmHg MV Mean grad:  3.0 mmHg     SHUNTS MV Vmax:       1.38 m/s     Systemic VTI:  0.16 m MV Vmean:      85.1 cm/s    Systemic Diam: 2.10 cm MV Decel Time: 151 msec MV E velocity: 117.00 cm/s MV A velocity: 97.90 cm/s MV E/A ratio:  1.20 Redell Shallow MD Electronically signed by Redell Shallow MD Signature Date/Time: 03/30/2024/3:48:22 PM    Final (Updated)         Scheduled Meds:  apixaban   2.5 mg Oral BID   feeding supplement  237 mL Oral TID BM   finasteride   5 mg Oral Daily   furosemide   40 mg Oral Daily   metoprolol  succinate  25 mg Oral BID   multivitamin with minerals  1 tablet Oral Daily   pantoprazole   40 mg Oral Daily   rosuvastatin   10 mg Oral Daily   sodium chloride  flush  3 mL Intravenous Q12H   tamsulosin   0.4 mg Oral Daily   thiamine   100 mg Oral Daily   Continuous Infusions:  amiodarone  30 mg/hr (04/01/24 0602)     LOS: 3 days    Time spent: 45 minutes spent on chart review, discussion with nursing staff, consultants, updating family and interview/physical exam; more than 50% of that time was spent in counseling and/or coordination of care.    Harlene RAYMOND Bowl, DO [facility] Available via Epic secure chat 7am-7pm After these hours, please refer to coverage provider listed on [email or web address] 04/01/2024, 1:50 PM

## 2024-04-01 NOTE — Telephone Encounter (Signed)
 Pt daughter would like Dr Ranee judgement on what the doctors at the hospital are discussing with her. Please advise.

## 2024-04-01 NOTE — Evaluation (Signed)
 Occupational Therapy Evaluation Patient Details Name: Richard Reed MRN: [id number] DOB: November 11, 1925 Today's Date: 04/01/2024   History of Present Illness   89 yo M adm 11/13 afib with RVR with ventricular tachycardia and acute HFrEF exacerbation. PMH PAF on eliquis , prostate CA, CAD, CKD III, vertebrobasilar stenosis, HTN, hyslipidemia, BPH, and HFrER.     Clinical Impressions Pt presents with decline in function and safety with ADLs and ADL mobility with impaired strength, balance and endurance. PTA pt lives at [facility] ILF/ALF and was Ind with ADLs/selfcare, uses RW for mobility and uses elevator to get down to dining hall, no reports of falls. Pt currently requires min A with LB ADLs, CGA standing at sink for grooming/hygiene, min A/CGA for toileting tasks and min A/CGA for mobility using RW. Pt's HR up to 106 with in room activity. OT will follow acutely to maximize level of function and safety     If plan is discharge home, recommend the following:   A little help with walking and/or transfers;A little help with bathing/dressing/bathroom;Assist for transportation     Functional Status Assessment   Patient has had a recent decline in their functional status and demonstrates the ability to make significant improvements in function in a reasonable and predictable amount of time.     Equipment Recommendations   None recommended by OT     Recommendations for Other Services         Precautions/Restrictions   Precautions Precautions: None Recall of Precautions/Restrictions: Intact Restrictions Weight Bearing Restrictions Per Provider Order: No     Mobility Bed Mobility Overal bed mobility: Needs Assistance Bed Mobility: Supine to Sit, Sit to Supine     Supine to sit: HOB elevated, Used rails, Contact guard Sit to supine: Modified independent (Device/Increase time)   General bed mobility comments: increased time to complete    Transfers Overall  transfer level: Needs assistance Equipment used: Rolling walker (2 wheels) Transfers: Sit to/from Stand Sit to Stand: Contact guard assist           General transfer comment: VC for hand placement      Balance Overall balance assessment: Needs assistance Sitting-balance support: Feet supported, No upper extremity supported Sitting balance-Leahy Scale: Good     Standing balance support: During functional activity, Reliant on assistive device for balance Standing balance-Leahy Scale: Fair                             ADL either performed or assessed with clinical judgement   ADL Overall ADL's : Needs assistance/impaired Eating/Feeding: Independent   Grooming: Wash/dry hands;Wash/dry face;Contact guard assist;Standing   Upper Body Bathing: Set up;Supervision/ safety;Sitting   Lower Body Bathing: Minimal assistance;Sit to/from stand   Upper Body Dressing : Set up;Supervision/safety;Sitting   Lower Body Dressing: Minimal assistance;Sit to/from stand   Toilet Transfer: Contact guard assist;Ambulation;Rolling walker (2 wheels);Regular Toilet;Grab bars;Cueing for safety   Toileting- Clothing Manipulation and Hygiene: Minimal assistance;Contact guard assist;Sit to/from stand;Cueing for safety       Functional mobility during ADLs: Contact guard assist;Rolling walker (2 wheels);Cueing for safety       Vision Baseline Vision/History: 1 Wears glasses Ability to See in Adequate Light: 0 Adequate Patient Visual Report: No change from baseline       Perception         Praxis         Pertinent Vitals/Pain Pain Assessment Pain Assessment: No/denies pain     Extremity/Trunk  Assessment Upper Extremity Assessment Upper Extremity Assessment: Generalized weakness   Lower Extremity Assessment Lower Extremity Assessment: Defer to PT evaluation   Cervical / Trunk Assessment Cervical / Trunk Assessment: Normal   Communication Communication Communication:  Impaired Factors Affecting Communication: Hearing impaired;Other (comment) (VERY HOH, had hearing aids)   Cognition Arousal: Alert Behavior During Therapy: WFL for tasks assessed/performed                                 Following commands: Intact       Cueing  General Comments   Cueing Techniques: Verbal cues;Gestural cues;Tactile cues      Exercises     Shoulder Instructions      Home Living Family/patient expects to be discharged to:: Private residence Living Arrangements: Alone Available Help at Discharge: Family;Available PRN/intermittently;Other (Comment) Type of Home: House Home Access: Level entry;Elevator     Home Layout: One level     Bathroom Shower/Tub: Producer, Television/film/video: Standard Bathroom Accessibility: Yes   Home Equipment: Agricultural Consultant (2 wheels);Rollator (4 wheels);Grab bars - tub/shower   Additional Comments: pt Mod I with mobility and ADLs at [facility], uses elevator to get down to dining hall, no reports of fall, daughter provides transportation to appointments      Prior Functioning/Environment Prior Level of Function : Independent/Modified Independent             Mobility Comments: pt uses RW to ambulation, pt's daughter states the RW is very old, like 20 years and he would benefit from a new one ADLs Comments: Ind with ADLs/selfcare    OT Problem List: Decreased strength;Decreased activity tolerance;Impaired balance (sitting and/or standing)   OT Treatment/Interventions: Self-care/ADL training;Therapeutic exercise;Patient/family education;Balance training;Therapeutic activities;DME and/or AE instruction      OT Goals(Current goals can be found in the care plan section)   Acute Rehab OT Goals Patient Stated Goal: go home OT Goal Formulation: With patient Time For Goal Achievement: 04/15/24 Potential to Achieve Goals: Good ADL Goals Pt Will Perform Grooming: with supervision;with  set-up;standing Pt Will Perform Lower Body Bathing: with contact guard assist;with supervision;sit to/from stand Pt Will Perform Lower Body Dressing: with contact guard assist;with supervision;sit to/from stand Pt Will Transfer to Toilet: with supervision;ambulating Pt Will Perform Toileting - Clothing Manipulation and hygiene: with contact guard assist;with supervision;sit to/from stand   OT Frequency:  Min 2X/week    Co-evaluation              AM-PAC OT 6 Clicks Daily Activity     Outcome Measure Help from another person eating meals?: None Help from another person taking care of personal grooming?: A Little Help from another person toileting, which includes using toliet, bedpan, or urinal?: A Little Help from another person bathing (including washing, rinsing, drying)?: A Little Help from another person to put on and taking off regular upper body clothing?: A Little Help from another person to put on and taking off regular lower body clothing?: A Little 6 Click Score: 19   End of Session Equipment Utilized During Treatment: Gait belt;Rolling walker (2 wheels) Nurse Communication: Mobility status  Activity Tolerance: Patient tolerated treatment well Patient left: in bed;with call bell/phone within reach;with bed alarm set  OT Visit Diagnosis: Other abnormalities of gait and mobility (R26.89);Unsteadiness on feet (R26.81);Muscle weakness (generalized) (M62.81)                Time: 8743-8677 OT Time  Calculation (min): 26 min Charges:  OT General Charges $OT Visit: 1 Visit OT Evaluation $OT Eval Moderate Complexity: 1 Mod OT Treatments $Therapeutic Activity: 8-22 mins   Jacques Karna Loose 04/01/2024, 2:29 PM

## 2024-04-01 NOTE — Consult Note (Cosign Needed Addendum)
 Palliative Care Consult Note                                  Date: 04/01/2024   Patient Name: Richard Reed  DOB: 01-17-1926  MRN: [id number]  Age / Sex: 89 y.o., male  PCP: Marvene Prentice SAUNDERS, FNP Referring Physician: Juvenal Harlene PENNER, DO  Reason for Consultation: Establishing goals of care  HPI/Patient Profile: 89 y.o. male  with past medical history of chronic HFrEF, mild to moderate MR, moderate AR, moderate AS, PAF on low-dose Eliquis , CAD involving the RCA, CKD stage III, history of prostate cancer, vertebrobasilar stenosis, HTN, HLD, and BPH who presented to the ED on 03/29/2024 with abrupt onset of midsternal chest pain that radiated to the back.  This was associated with severe nausea, diaphoresis, and shortness of breath.  In the ED, he was found to be in A-fib with RVR.  He also had episodes of ventricular tachycardia in the field requiring defibrillation x 1.  He is admitted with acute on chronic HFrEF and severe aortic stenosis.  Palliative Medicine has been consulted for goals of care discussions and complex medical decision making.   Clinical Assessment and Goals of Care:   Extensive chart review has been completed including labs, vital signs, imaging, progress/consult notes, orders, medications and available advance directive documents.    I met with patient at bedside and later spoke with his daughter/Richard Reed by phone to discuss diagnosis, prognosis, GOC, EOL wishes, disposition, and options.  I introduced Palliative Medicine as specialized medical care for people living with serious illness. It focuses on providing relief from the symptoms and stress of a serious illness.   Created space and opportunity for patient and family to express thoughts and feelings regarding current medical situation. Values and goals of care were attempted to be elicited.  Life Review: Richard Reed shares that her father has had a wonderful life. 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 He worked in [employer] for many years, starting when he was age 62. His wife of 73 years passed away earlier this year in April. They shared 2 daughters, Richard Reed and Richard Reed.  Patient has 8 grandchildren, 72 great-grandchildren, and 4 great-great-grandchildren  Functional Status: Patient has lived at [facility] independent living since April 2024. At baseline, he is ambulatory and was independent with basic ADLs.   Discussion: I met with patient, but communication was challenging due to he is very hard of hearing. He reports feeling good and is relieved that his HR is better controlled now. He understands that his heart is very weak. We discussed that his overall prognosis is guarded. He tells me he is ready, but I did not confirm if he is referring to end-of-life.   When I spoke with daughter/Richard Reed, we discussed patient's current illness and what it means in the larger context of his ongoing co-morbidities. Current clinical status was reviewed. Discussed natural trajectory of advanced heart failure as progressive and non-curable. Also reviewed that patient has severe aortic stenosis, and is not a candidate for advanced interventions.   Discussed that patient is high risk to decompensate secondary to advanced heart failure and severe AS. I discussed with Richard Reed my recommendation to consider the addition of hospice support at home. I explained that hospice would be extra care for patient, support for family, and assistance with symptom management and care when he further declines.  Discussed code status and encouraged consideration of DNR/DNI status, understanding evidence-based poor outcomes in similar hospitalized patients, as the cause of cardiac arrest would likely be associated with advanced illness rather than a reversible condition. Richard Reed seems to indicate she agrees DNR status is appropriate, but is not ready  to make that decision today.   Discussed the importance of continued conversation with the medical team regarding overall plan of care. Plan to meet tomorrow in-person for ongoing GOC discussion. Emotional support provided.    Review of Systems  Unable to perform ROS   Objective:   Primary Diagnoses: Present on Admission:  Atrial fibrillation with RVR [facility])   Physical Exam Vitals reviewed.  Constitutional:      General: He is not in acute distress.    Comments: Chronic ill-appearing  HENT:     Ears:     Comments: Hard of hearing Cardiovascular:     Rate and Rhythm: Tachycardia present. Rhythm irregularly irregular.  Neurological:     Mental Status: He is alert and oriented to person, place, and time.     Palliative Assessment/Data: PPS 40%     Assessment & Plan:   SUMMARY OF RECOMMENDATIONS   Full code for now, with ongoing discussion Continue current scope of care Plan to meet tomorrow at 3 pm  Primary Decision Maker: Patient with support from his daughter/Richard Reed  Existing Vynca/ACP Documentation: None  Prognosis:  < 6 months  Discharge Planning:  To Be Determined    Thank you for allowing us  to participate in the care of Richard Reed   Time Total: 75 minutes  Detailed review of medical records (labs, imaging, vital signs), medically appropriate exam, discussed with treatment team, counseling and education to patient, family, & staff, documenting clinical information, coordination of care.   Signed by: Recardo Loll, NP Palliative Medicine Team  Team Phone # [phone]  For individual providers, please see AMION

## 2024-04-02 ENCOUNTER — Other Ambulatory Visit (HOSPITAL_COMMUNITY): Payer: Self-pay

## 2024-04-02 ENCOUNTER — Other Ambulatory Visit: Payer: Self-pay | Admitting: Student

## 2024-04-02 DIAGNOSIS — I5043 Acute on chronic combined systolic (congestive) and diastolic (congestive) heart failure: Secondary | ICD-10-CM

## 2024-04-02 MED ORDER — FUROSEMIDE 40 MG PO TABS
40.0000 mg | ORAL_TABLET | Freq: Every day | ORAL | Status: DC
  Administered 2024-04-03 – 2024-04-04 (×2): 40 mg via ORAL
  Filled 2024-04-02 (×2): qty 1

## 2024-04-02 MED ORDER — AMIODARONE HCL 200 MG PO TABS: 200.0000 mg | ORAL_TABLET | Freq: Every day | ORAL | Status: DC

## 2024-04-02 MED ORDER — AMIODARONE HCL 200 MG PO TABS
400.0000 mg | ORAL_TABLET | Freq: Two times a day (BID) | ORAL | Status: DC
  Administered 2024-04-03 – 2024-04-04 (×3): 400 mg via ORAL
  Filled 2024-04-02 (×3): qty 2

## 2024-04-02 MED ORDER — FUROSEMIDE 10 MG/ML IJ SOLN
40.0000 mg | Freq: Once | INTRAMUSCULAR | Status: AC
  Administered 2024-04-02: 10:00:00 40 mg via INTRAVENOUS
  Filled 2024-04-02: qty 4

## 2024-04-02 MED ORDER — AMIODARONE HCL 200 MG PO TABS: 400.0000 mg | ORAL_TABLET | Freq: Every day | ORAL | Status: DC

## 2024-04-02 NOTE — Progress Notes (Addendum)
 Progress Note  Patient Name: Richard Reed Date of Encounter: 04/02/2024 [facility] Cardiologist: Dorn Lesches, MD   Interval Summary   It was difficult to interview the patient because he is hard of hearing and did not have his hearing aids in.Denies any lower extremity edema, orthopnea, chest pain or abdominal pain.  Vital Signs Vitals:   04/01/24 2258 04/01/24 2300 04/02/24 0342 04/02/24 0427  BP: 111/81 111/81 119/80   Pulse: 88 81 63   Resp: 18  16   Temp: (!) 97.2 F (36.2 C)  97.8 F (36.6 C)   TempSrc: Oral  Oral   SpO2: 93% 94% 98%   Weight:    58.2 kg  Height:        Intake/Output Summary (Last 24 hours) at 04/02/2024 0816 Last data filed at 04/01/2024 2300 Gross per 24 hour  Intake 617.83 ml  Output --  Net 617.83 ml      04/02/2024    4:27 AM 04/01/2024    4:12 AM 03/31/2024    4:50 AM  Last 3 Weights  Weight (lbs) 128 lb 6.4 oz 134 lb 4.8 oz 134 lb 6.4 oz  Weight (kg) 58.242 kg 60.918 kg 60.963 kg      Telemetry/ECG  WAP VS NSR in the 80s to 90s- Personally Reviewed  Physical Exam  GEN: No acute distress.   Neck: No JVD Cardiac: irregularly irregular rhythm, 1/6 systolic murmur Respiratory: Clear to auscultation bilaterally. GI: Soft, nontender, non-distended  MS: No edema  Assessment & Plan   89 y.o. male with a hx of CAD, CKD, PAF, HTN, HLD, HFrEF (Reportedly 30% at hospital in [place] ), moderate aortic insufficiency who is being seen 03/29/2024 for the evaluation of chest pain and A-fib with RVR.   Acute on chronic HFrEF Ascending aortic dilation Severe low-flow low gradient aortic stenosis TTE on 03/30/2024 showed a reduced LVEF of 25 to 30%, G2 DD, normal RV systolic function, moderately dilated left and right atrium, severe aortic valve stenosis (mean gradient 19 mmHg, AV area by VTI 0.91 cm), RV systolic pressure estimated at 38.8 mmHg, and ascending aorta dilation measuring 47 mm. Received several doses of IV Lasix  and  was transition to oral Lasix . Dr Floretta ordered 1 dose of 40mg  IV lasix  and held oral lasix . Patient does not want to proceed with any procedures.  This is reasonable given the patient's age. palliative care has been consulted to discuss goals of care. GDMT Limited by hypotension.  Will prioritize rate control as per A-fib below.     A-fib with RVR MAT Could be contributing to heart failure. Was started on IV amiodarone . Heart rates on telemetry are in the 70s to 110s and underlying rhythm appears to be WAP vs NSR. Continue Eliquis  2.5 mg twice daily. In total has received about 3.02 g of IV amiodarone .  Stop IV amiodarone  today at noon. Start oral amiodarone  400 mg twice daily for 7 days.  Then 400 mg daily for 5 days.  Then 200 mg daily. Plan to change to metoprolol  succinate 25 mg twice daily.      Sustained VT Had an episode of sustained VT when was being transported by EMS.  Would like to not pursue ICD given age.  On amiodarone  and metoprolol  as above.     Elevated troponins 75 > 445 > 906 In the setting of sustained VT and systolic heart failure as mentioned above.  Would like to manage conservatively and does not want to pursue invasive  procedures.     CKD stage IIIb Waiting for labs today.    For questions or updates, please contact [facility] Please consult [email or web address] for contact info under        Signed, Chrystel Barefield, PA-C

## 2024-04-02 NOTE — Plan of Care (Signed)
°  Problem: Clinical Measurements: Goal: Will remain free from infection Outcome: Progressing   Problem: Nutrition: Goal: Adequate nutrition will be maintained Outcome: Progressing   Problem: Activity: Goal: Risk for activity intolerance will decrease Outcome: Progressing   Problem: Pain Managment: Goal: General experience of comfort will improve and/or be controlled Outcome: Progressing   Problem: Safety: Goal: Ability to remain free from injury will improve Outcome: Progressing

## 2024-04-02 NOTE — Care Management Important Message (Signed)
 Important Message  Patient Details  Name: Richard Reed MRN: [id number] Date of Birth: 02/14/1926   Important Message Given:  Yes - Medicare IM     [name] 04/02/2024, 10:56 AM

## 2024-04-02 NOTE — Progress Notes (Signed)
 Mobility Specialist Progress Note:    04/02/24 1010  Mobility  Activity Ambulated with assistance  Level of Assistance Minimal assist, patient does 75% or more  Assistive Device Front wheel walker  Distance Ambulated (ft) 100 ft  Activity Response Tolerated well  Mobility Referral Yes  Mobility visit 1 Mobility  Mobility Specialist Start Time (ACUTE ONLY) 0955  Mobility Specialist Stop Time (ACUTE ONLY) 1009  Mobility Specialist Time Calculation (min) (ACUTE ONLY) 14 min   Pt received in bed agreeable to mobility. MinA needed throughout session d/t unsteadiness. No c/o throughout, VSS. Returned to room w/o fault. Left supine in bed w/ call bell and personal belongings in reach. All needs met. Bed alarm on.  Thersia Minder Mobility Specialist  Please contact vis Secure Chat or  Rehab Office [phone]

## 2024-04-02 NOTE — Progress Notes (Signed)
 Heart Failure Navigator Progress Note  Assessed for Heart & Vascular TOC clinic readiness.  Patient does not meet criteria due to has a scheduled [facility] appointment on 05/08/2024. Patient requested no procedures, conservative management. Has a Palliative Care consult for Goals of care. No HF TOC. .   Navigator will sign off at this time.   Stephane Haddock, BSN, Scientist, Clinical (histocompatibility And Immunogenetics) Only

## 2024-04-02 NOTE — Progress Notes (Signed)
 Palliative Medicine Progress Note   Patient Name: Richard Reed       Date: 04/02/2024 DOB: 09/18/1925  Age: 89 y.o. MRN#: [id number] Attending Physician: Juvenal Harlene PENNER, DO Primary Care Physician: Marvene Prentice SAUNDERS, FNP Admit Date: 03/29/2024   HPI/Patient Profile: 89 y.o. male  with past medical history of chronic HFrEF, mild to moderate MR, moderate AR, moderate AS, PAF on low-dose Eliquis , CAD involving the RCA, CKD stage III, history of prostate cancer, vertebrobasilar stenosis, HTN, HLD, and BPH who presented to the ED on 03/29/2024 with abrupt onset of midsternal chest pain that radiated to the back.  This was associated with severe nausea, diaphoresis, and shortness of breath.  In the ED, he was found to be in A-fib with RVR.  He also had episodes of ventricular tachycardia in the field requiring defibrillation x 1.  He is admitted with acute on chronic HFrEF and severe aortic stenosis.   Palliative Medicine has been consulted for goals of care discussions and complex medical decision making.   Subjective: Chart reviewed. Note amiodarone  infusion has been transition to PO.  Patient assessed. He is sleeping at the time of my visit, and I did not attempt to wake him  Discussion: I met with daughter/Karen to discuss goals of care. Ongoing discussion was had about the seriousness of patient's current medical situation and overall guarded prognosis.   I shared my concern that although patient is currently stable, he will remain at risk to decompensate secondary to advanced heart failure and severe AS.   We discussed the concept of human mortality and the limitations of medical interventions to prolong quality of life when the body starts to fail.  Reviewed code status and encouraged  consideration of DNR/DNI status, understanding evidence-based poor outcomes in similar hospitalized patients. Darice agrees that DNR/DNI status is appropriate.  We discussed the difference between limited interventions (treat the treatable) versus comfort care. For now, Darice wants to continue to treat the treatable. She  understands her father will not likely return to his previous baseline, but is hopeful for recovery to the extent possible given his co-morbidities.   She is also receptive to the concept of a comfort path (hospice) when she feels the time is right. For now, she is agreeable to outpatient palliative, but wants to have that referral made  by [facility].   We completed a MOST form today. The following treatment decisions have been outlined:  Cardiopulmonary Resuscitation: Do Not Attempt Resuscitation (DNR/No CPR)  Medical Interventions: Limited Additional Interventions: Use medical treatment, IV fluids and cardiac monitoring as indicated, DO NOT USE intubation or mechanical ventilation. May consider use of less invasive airway support such as BiPAP or CPAP. Also provide comfort measures. Transfer to the hospital if indicated. Avoid intensive care.   Antibiotics: Determine use of limitation of antibiotics when infection occurs  IV Fluids: IV fluids for a defined trial period  Feeding Tube: No feeding tube     Objective:  Physical Exam Vitals reviewed.  Constitutional:      General: He is sleeping. He is not in acute distress.    Comments: Chrinically ill-appearing  Cardiovascular:     Rate and Rhythm: Normal rate.  Pulmonary:     Effort: Pulmonary effort is normal.             Palliative Medicine Assessment & Plan   Assessment: Principal Problem:   Atrial fibrillation with RVR (HCC) Active Problems:   Acute on chronic combined systolic and diastolic CHF (congestive heart failure) (HCC)   V-tach (HCC)    Recommendations/Plan: Code status changed to DNR with  limited interventions MOST form completed - original given to daughter and copy made to scan into EMR Continue current supportive care Goal is for improvement and to return to ILF when medically stable Daughter agreeable to outpatient palliative, but wants to have Gery Se[facility] make this referral PMT will continue to follow  Prognosis:  Unable to determine  Discharge Planning: To Be Determined    Thank you for allowing the Palliative Medicine Team to assist in the care of this patient.   Time: 67 minutes  Detailed review of medical records (labs, imaging, vital signs), medically appropriate exam, discussed with treatment team, counseling and education to patient, family, & staff, documenting clinical information, coordination of care.    Damek Ende B Martell Mcfadyen, NP   Please contact Palliative Medicine Team phone at [phone] for questions and concerns.  For individual providers, please see AMION.

## 2024-04-02 NOTE — TOC Initial Note (Signed)
 Transition of Care [facility]) - Initial/Assessment Note    Patient Details  Name: Richard Reed MRN: [id number] Date of Birth: [birth year]  [facility] CM/SW Contact:    Sudie Erminio Deems, RN Phone Number: 04/02/2024, 12:44 PM  Clinical Narrative:  Patient presented for chest pain. PTA patient was from [facility] ILF. Patient provided ICM with verbal permission to call his daughter. Daughter states patient has PCP and she takes him to all appointments. Patient had an old rolling walker and is in need of a new rolling walker. Daughter did not have an agency preference for DME. DME submitted via the [facility] and [employer] will supply and deliver to the room. Daughter wants to use [facility] in the facility. Office will need H&P, orders, PT/OT notes, and DC summary. ICM will submit clinicals on the day of discharge. No further needs identified at this time.           Expected Discharge Plan: Home w Home Health Services Barriers to Discharge: No Barriers Identified   Patient Goals and CMS Choice Patient states their goals for this hospitalization and ongoing recovery are:: plans to return to [facility] ILF   Choice offered to / list presented to : Adult Children (Daughter chose the [facility] PT/OT)      Expected Discharge Plan and Services   Discharge Planning Services: CM Consult Post Acute Care Choice: Home Health Living arrangements for the past 2 months: Independent Living Facility                 DME Arranged: Walker rolling DME Agency: [employer] Date DME Agency Contacted: 04/02/24 Time DME Agency Contacted: 1230 Representative spoke with at DME Agency: [facility] HH Arranged: OT, PT HH Agency:  [employer]/development Worker)        Prior Living Arrangements/Services Living arrangements for the past 2 months: Independent Living Facility Lives with:: Facility Resident Patient language and need for interpreter reviewed:: Yes Do you feel safe going back to the place  where you live?: Yes      Need for Family Participation in Patient Care: Yes (Comment) Care giver support system in place?: Yes (comment)   Criminal Activity/Legal Involvement Pertinent to Current Situation/Hospitalization: No - Comment as needed  Activities of Daily Living   ADL Screening (condition at time of admission) Independently performs ADLs?: Yes (appropriate for developmental age) Is the patient deaf or have difficulty hearing?: Yes Does the patient have difficulty seeing, even when wearing glasses/contacts?: Yes Does the patient have difficulty concentrating, remembering, or making decisions?: No  Permission Sought/Granted Permission sought to share information with : Family Supports, Case Production Designer, Theatre/television/film, Oceanographer granted to share information with : Yes, Verbal Permission Granted     Permission granted to share info w AGENCY: [facility] [employer]        Emotional Assessment Appearance:: Appears stated age Attitude/Demeanor/Rapport: Engaged Affect (typically observed): Appropriate Orientation: : Oriented to Self, Oriented to Place Alcohol  / Substance Use: Not Applicable Psych Involvement: No (comment)  Admission diagnosis:  V-tach (HCC) [I47.20] Atrial fibrillation with RVR (HCC) [I48.91] Shockable cardiac rhythm detected by automated external defibrillator [I49.9] Patient Active Problem List   Diagnosis Date Noted   Atrial fibrillation with RVR (HCC) 03/29/2024   Acute on chronic combined systolic and diastolic CHF (congestive heart failure) (HCC) 03/29/2024   V-tach (HCC) 03/29/2024   GI bleed due to NSAIDs 04/20/2022   Chronic anticoagulation 06/09/2020   History of stroke 06/09/2020   Left ventricular dysfunction  09/10/2019   Rectal bleed 12/01/2018   Asymptomatic PVCs 05/22/2017   Vertebrobasilar insufficiency 04/28/2016   Coronary artery disease due to lipid rich plaque 04/26/2016   Calcified pleural plaque due to  asbestos exposure 03/31/2016   Syncope 02/29/2016   LOC (loss of consciousness) (HCC) 02/28/2016   UTI (urinary tract infection) 02/28/2016   Hemiparesis and alteration of sensations as late effects of stroke (HCC) 04/29/2015   TIA (transient ischemic attack) 12/02/2014   Visual disturbance 03/19/2014   Diplopia 03/19/2014   Essential hypertension 04/30/2013   Dyslipidemia 04/30/2013   PAF (paroxysmal atrial fibrillation) (HCC) 10/29/2012   Aortic valve insufficiency, moderate 10/29/2012   Heme positive stool 07/17/2012   Biliary colic 04/13/2011   Prostate CA (HCC) 04/13/2011   Thrombocytopenia (HCC) 03/24/2011   Encephalopathy 03/24/2011   Metabolic acidosis 03/24/2011   Acute on chronic renal failure (HCC) 03/23/2011   Hypotension 03/23/2011   Hard of hearing 03/23/2011   Chronic renal insufficiency, stage III (moderate) 03/23/2011   PCP:  Marvene Prentice SAUNDERS, FNP Pharmacy:   [facility] - [facility] [address] [place] [address] Phone: [phone] Fax: [phone]     Social Drivers of Health (SDOH) Social History: SDOH Screenings   Food Insecurity: No Food Insecurity (03/30/2024)  Housing: Low Risk (03/30/2024)  Transportation Needs: No Transportation Needs (03/30/2024)  Utilities: Not At Risk (03/30/2024)  Social Connections: Moderately Isolated (03/30/2024)  Tobacco Use: Low Risk (03/30/2024)   SDOH Interventions:     Readmission Risk Interventions     No data to display

## 2024-04-02 NOTE — Telephone Encounter (Signed)
 I called the daughter and she said she called [employer] and had to leave a message for chris and chris never called back. She is asking if there was another way to get in touch with chris or if she just needs to keep trying chris. She said if she has to she will pay for the eliquis  one time and then he should be approved for bms assistance since the eliquis  is 600 and 3% of his income is 593.64   Per isabel-they can also call the national shiip line at [phone]. they usually can do the application for extra help/LIS also.    Daughter to try those numbers

## 2024-04-02 NOTE — Progress Notes (Signed)
 PROGRESS NOTE    Richard Reed  FMW:[id number] DOB: 1926-01-25 DOA: 03/29/2024 PCP: Marvene Prentice SAUNDERS, FNP    Brief Narrative:  Richard Reed is a 89 y.o. male from [facility] with medical history significant of PAF on low-dose Eliquis , history of prostate cancer, CAD involving the RCA medical therapy only with cardiac cath in 2012, chronic kidney disease stage III known vertebrobasilar stenosis, hypertension, dyslipidemia, BPH on Flomax ,HFrEF with a component of diastolic dysfunction, mild to moderate MR, moderate AR, moderate AS.  He developed abrupt onset of midsternal chest pain that radiated to the back this was associated with severe nausea diaphoresis as well as shortness of breath.  EMS was subsequently called and patient came to the emergency room. Upon presentation to the ED he was in A-fib with RVR with ventricular responses in the 130-140 range.  Cardiologist consulted and medications being changed.     Assessment and Plan: Atrial fibrillation with RVR Ventricular tachycardia Cardiologist on board.  Appreciate input Currently on amiodarone  drip but there is plans to switch to oral amiodarone  on 12/16 Patient also had episodes of ventricular tachycardia in the field requiring defibrillation 360 J x 1. Continue home Eliquis  Keep magnesium  and potassium within normal limits Metoprolol  added  by cardiologist-blood pressure limiting titration   Acute exacerbation of HFrEF Diastolic dysfunction Known mild to moderate MR, moderate AR, and moderate AS per echo 2021 Suspect secondary to at least one week of RVR based on history given by patient and family Continue IV Lasix  40 mg twice daily with potassium supplementation-follow electrolyte Holding calcium  channel blocker  -echocardiogram: Left ventricular ejection fraction, by estimation, is 25 to 30%. severely decreased function.  Grade II diastolic dysfunction  Strict intake and output and daily weights   CAD with RCA 40 to 60% ostial in  2012, medically managed Continue statin therapy as well as metoprolol    Hypertension Continue current antihypertensives about   CKD stage IIIa Baseline creatinine was 1.15 in January 2024 and in March 2025 was 1.54 -creatinine baseline is 1.5    Thrombocytopenia Documented in March 2024 with platelets 119,000 No signs of bleeding Continue to follow   Known vertebrobasilar stenosis Currently asymptomatic   History of prostate cancer/BPH Continue Flomax    HLD Continue Crestor    Protein calorie malnutrition Last known weight was 61.2 kg with a BMI of 19.9 Nutrition consultation Ensure supplementation   Physical deconditioning According to family patient will walk from his room at the [facility] to the common area where he sits on the couch most days PT/OT evaluation-Home health  Palliative care to see patient on 12/16     DVT prophylaxis: apixaban  (ELIQUIS ) tablet 2.5 mg Start: 03/29/24 1600 apixaban  (ELIQUIS ) tablet 2.5 mg    Code Status: Full Code   Disposition Plan:  Level of care: Progressive Status is: Inpatient     Consultants:  cards   Subjective: Able to walk to bathroom on own  Objective: Vitals:   04/02/24 0342 04/02/24 0427 04/02/24 0952 04/02/24 0956  BP: 119/80  108/65 109/73  Pulse: 63  88   Resp: 16  17   Temp: 97.8 F (36.6 C)  98.1 F (36.7 C)   TempSrc: Oral  Oral   SpO2: 98%  98%   Weight:  58.2 kg    Height:        Intake/Output Summary (Last 24 hours) at 04/02/2024 1216 Last data filed at 04/02/2024 0900 Gross per 24 hour  Intake 857.83 ml  Output --  Net  857.83 ml   Filed Weights   03/31/24 0450 04/01/24 0412 04/02/24 0427  Weight: 61 kg 60.9 kg 58.2 kg    Examination:   General: Appearance:    Thin male in no acute distress     Lungs:     respirations unlabored  Heart:    Normal heart rate. irreg     MS:   All extremities are intact.    Neurologic:   Awake, alert       Data Reviewed: I have personally  reviewed following labs and imaging studies  CBC: Recent Labs  Lab 03/29/24 0955 03/29/24 1012 03/31/24 0538 04/01/24 0547  WBC 4.7  --  4.3 4.2  NEUTROABS 3.5  --   --   --   HGB 13.5 13.6 13.9 13.8  HCT 40.9 40.0 40.6 40.3  MCV 98.3  --  96.0 96.0  PLT 116*  --  108* 107*   Basic Metabolic Panel: Recent Labs  Lab 03/29/24 0955 03/29/24 1012 03/30/24 0349 03/31/24 0538 04/01/24 0547  NA 141 143 140 137 137  K 3.2* 3.7 4.5 3.9 3.6  CL 112* 108 108 103 103  CO2 19*  --  27 23 23   GLUCOSE 104* 112* 106* 87 90  BUN 31* 35* 33* 29* 36*  CREATININE 1.30* 1.60* 1.58* 1.53* 1.77*  CALCIUM  7.0*  --  8.8* 8.3* 8.5*  MG 1.8  --  2.1 2.0 1.9   GFR: Estimated Creatinine Clearance: 20.1 mL/min (A) (by C-G formula based on SCr of 1.77 mg/dL (H)). Liver Function Tests: Recent Labs  Lab 03/29/24 0955  AST 24  ALT 33  ALKPHOS 53  BILITOT 1.3*  PROT 5.0*  ALBUMIN 3.0*   No results for input(s): LIPASE, AMYLASE in the last 168 hours. No results for input(s): AMMONIA in the last 168 hours. Coagulation Profile: No results for input(s): INR, PROTIME in the last 168 hours. Cardiac Enzymes: No results for input(s): CKTOTAL, CKMB, CKMBINDEX, TROPONINI in the last 168 hours. BNP (last 3 results) No results for input(s): PROBNP in the last 8760 hours. HbA1C: No results for input(s): HGBA1C in the last 72 hours. CBG: No results for input(s): GLUCAP in the last 168 hours. Lipid Profile: No results for input(s): CHOL, HDL, LDLCALC, TRIG, CHOLHDL, LDLDIRECT in the last 72 hours. Thyroid  Function Tests: No results for input(s): TSH, T4TOTAL, FREET4, T3FREE, THYROIDAB in the last 72 hours.  Anemia Panel: No results for input(s): VITAMINB12, FOLATE, FERRITIN, TIBC, IRON, RETICCTPCT in the last 72 hours. Sepsis Labs: Recent Labs  Lab 03/29/24 1013 03/29/24 1150  LATICACIDVEN 2.0* 1.4    No results found for this or any  previous visit (from the past 240 hours).       Radiology Studies: No results found.       Scheduled Meds:  [START ON 04/15/2024] amiodarone   200 mg Oral Daily   [START ON 04/03/2024] amiodarone   400 mg Oral BID   [START ON 04/10/2024] amiodarone   400 mg Oral Daily   apixaban   2.5 mg Oral BID   feeding supplement  237 mL Oral TID BM   finasteride   5 mg Oral Daily   [START ON 04/03/2024] furosemide   40 mg Oral Daily   metoprolol  succinate  25 mg Oral BID   multivitamin with minerals  1 tablet Oral Daily   pantoprazole   40 mg Oral Daily   rosuvastatin   10 mg Oral Daily   sodium chloride  flush  3 mL Intravenous Q12H   tamsulosin   0.4 mg Oral Daily   thiamine   100 mg Oral Daily   Continuous Infusions:     LOS: 4 days    Time spent: 45 minutes spent on chart review, discussion with nursing staff, consultants, updating family and interview/physical exam; more than 50% of that time was spent in counseling and/or coordination of care.    Harlene RAYMOND Bowl, DO [facility] Available via Epic secure chat 7am-7pm After these hours, please refer to coverage provider listed on [email or web address] 04/02/2024, 12:16 PM

## 2024-04-03 ENCOUNTER — Other Ambulatory Visit (HOSPITAL_COMMUNITY): Payer: Self-pay

## 2024-04-03 DIAGNOSIS — I4891 Unspecified atrial fibrillation: Secondary | ICD-10-CM | POA: Diagnosis not present

## 2024-04-03 DIAGNOSIS — E43 Unspecified severe protein-calorie malnutrition: Secondary | ICD-10-CM | POA: Insufficient documentation

## 2024-04-03 LAB — BASIC METABOLIC PANEL WITH GFR
Anion gap: 11 (ref 5–15)
BUN: 38 mg/dL — ABNORMAL HIGH (ref 8–23)
CO2: 24 mmol/L (ref 22–32)
Calcium: 8.7 mg/dL — ABNORMAL LOW (ref 8.9–10.3)
Chloride: 103 mmol/L (ref 98–111)
Creatinine, Ser: 1.7 mg/dL — ABNORMAL HIGH (ref 0.61–1.24)
GFR, Estimated: 36 mL/min — ABNORMAL LOW (ref >60)
Glucose, Bld: 88 mg/dL (ref 70–99)
Potassium: 4.1 mmol/L (ref 3.5–5.1)
Sodium: 138 mmol/L (ref 135–145)

## 2024-04-03 LAB — CBC
HCT: 41.1 % (ref 39.0–52.0)
Hemoglobin: 14.1 g/dL (ref 13.0–17.0)
MCH: 32.3 pg (ref 26.0–34.0)
MCHC: 34.3 g/dL (ref 30.0–36.0)
MCV: 94.3 fL (ref 80.0–100.0)
Platelets: 102 K/uL — ABNORMAL LOW (ref 150–400)
RBC: 4.36 MIL/uL (ref 4.22–5.81)
RDW: 14.1 % (ref 11.5–15.5)
WBC: 4.3 K/uL (ref 4.0–10.5)
nRBC: 0 % (ref 0.0–0.2)

## 2024-04-03 MED ORDER — METOPROLOL SUCCINATE ER 50 MG PO TB24
50.0000 mg | ORAL_TABLET | Freq: Two times a day (BID) | ORAL | Status: DC
  Administered 2024-04-03 – 2024-04-04 (×3): 50 mg via ORAL
  Filled 2024-04-03 (×3): qty 1

## 2024-04-03 NOTE — Progress Notes (Signed)
 Occupational Therapy Treatment Patient Details Name: DADRIAN BALLANTINE MRN: [id number] DOB: [birth year] Today's Date: 04/03/2024   History of present illness 89 yo M adm 11/13 afib with RVR with ventricular tachycardia and acute HFrEF exacerbation. PMH PAF on eliquis , prostate CA, CAD, CKD III, vertebrobasilar stenosis, HTN, hyslipidemia, BPH, and HFrER.   OT comments  Pt in bed upon therapy arrival and agreeable to participate in OT session focusing on BUE strength and endurance. Pt completed BUE exercises while seated EOB. Provided pt with verbal instructions and visual demonstration for proper form and technique. Pt was able to demonstrate understanding with occasional cueing for form. Pt continues to be appropriate for follow up Home health OT services at discharge to focus on improving overall functional performance during self care tasks.       If plan is discharge home, recommend the following:  A little help with walking and/or transfers;A little help with bathing/dressing/bathroom;Assist for transportation   Equipment Recommendations  None recommended by OT       Precautions / Restrictions Precautions Precautions: Fall Recall of Precautions/Restrictions: Intact Precaution/Restrictions Comments: watch HR Restrictions Weight Bearing Restrictions Per Provider Order: No       Mobility Bed Mobility Overal bed mobility: Modified Independent          Balance Overall balance assessment: No apparent balance deficits (not formally assessed) (with sitting balance)      ADL either performed or assessed with clinical judgement              Communication Communication Communication: Impaired Factors Affecting Communication: Hearing impaired (has hearing aids although they don't help)   Cognition Arousal: Alert Behavior During Therapy: WFL for tasks assessed/performed Cognition: No apparent impairments      Following commands: Intact        Cueing   Cueing  Techniques: Verbal cues, Gestural cues  Exercises General Exercises - Upper Extremity Shoulder ADduction: Strengthening, Both, 10 reps, Seated, Theraband Theraband Level (Shoulder Adduction): Level 3 (Green) Shoulder Horizontal ABduction: Strengthening, Both, 10 reps, Seated, Theraband Theraband Level (Shoulder Horizontal Abduction): Level 3 (Green) Shoulder Exercises Shoulder External Rotation: Strengthening, Both, 10 reps, Seated, Theraband Theraband Level (Shoulder External Rotation): Level 3 (Green) Other Exercises Other Exercises: seated, BUE, green band, shoulder PNF pattern D2, 10X, 1 set            Pertinent Vitals/ Pain       Pain Assessment Pain Assessment: No/denies pain         Frequency  Min 2X/week        Progress Toward Goals  OT Goals(current goals can now be found in the care plan section)  Progress towards OT goals: Progressing toward goals      AM-PAC OT 6 Clicks Daily Activity     Outcome Measure   Help from another person eating meals?: None Help from another person taking care of personal grooming?: None Help from another person toileting, which includes using toliet, bedpan, or urinal?: None Help from another person bathing (including washing, rinsing, drying)?: None Help from another person to put on and taking off regular upper body clothing?: None Help from another person to put on and taking off regular lower body clothing?: None 6 Click Score: 24    End of Session    OT Visit Diagnosis: Other abnormalities of gait and mobility (R26.89);Unsteadiness on feet (R26.81);Muscle weakness (generalized) (M62.81)   Activity Tolerance Patient tolerated treatment well   Patient Left in bed;with call bell/phone within reach;with bed alarm set  Time: 8399-8382 OT Time Calculation (min): 17 min  Charges: OT General Charges $OT Visit: 1 Visit OT Treatments $Therapeutic Exercise: 8-22 mins  Leita Howell, OTR/L,CBIS   Supplemental OT - [facility] and [facility] Secure Chat Preferred    Lyza Houseworth, Leita BIRCH 04/03/2024, 4:51 PM

## 2024-04-03 NOTE — Progress Notes (Signed)
 Physical Therapy Treatment Patient Details Name: Richard Reed MRN: [id number] DOB: December 28, 1925 Today's Date: 04/03/2024   History of Present Illness 89 yo M adm 11/13 afib with RVR with ventricular tachycardia and acute HFrEF exacerbation. PMH PAF on eliquis , prostate CA, CAD, CKD III, vertebrobasilar stenosis, HTN, hyslipidemia, BPH, and HFrER.   PT Comments  Pt received in supine and agreeable to PT session. Pt reported feeling weak this morning with improvements noted throughout the day. Pt improved by needing less assistance, ModI, for bed mobility with increased time and effort. Pt wanted to focus on gait training with pt able to ambulate 167ft with supervision and use of RW. After returning to the room, pt requested to return to supine to rest. Pt feels comfortable d/c home with no further questions/concerns about mobility. Will continue to follow acutely.   90 BPM at rest 120s-140 BPM during gait 94% SpO2 on RA    If plan is discharge home, recommend the following: A little help with walking and/or transfers;A little help with bathing/dressing/bathroom;Assist for transportation   Can travel by private vehicle      Yes  Equipment Recommendations  Rolling walker (2 wheels)       Precautions / Restrictions Precautions Precautions: Fall Recall of Precautions/Restrictions: Intact Precaution/Restrictions Comments: watch HR Restrictions Weight Bearing Restrictions Per Provider Order: No     Mobility  Bed Mobility Overal bed mobility: Needs Assistance Bed Mobility: Supine to Sit, Sit to Supine    Supine to sit: Modified independent (Device/Increase time), HOB elevated, Used rails    General bed mobility comments: ModI with increased time and effort    Transfers Overall transfer level: Needs assistance Equipment used: Rolling walker (2 wheels) Transfers: Sit to/from Stand Sit to Stand: Supervision    General transfer comment: supervision for safety, no physical assist  needed    Ambulation/Gait Ambulation/Gait assistance: Supervision Gait Distance (Feet): 160 Feet Assistive device: Rolling walker (2 wheels) Gait Pattern/deviations: Narrow base of support, Step-through pattern, Decreased step length - right, Decreased step length - left Gait velocity: decreased     General Gait Details: steady gait with use of RW, no overt LOB     Balance Overall balance assessment: Needs assistance Sitting-balance support: Feet supported, No upper extremity supported Sitting balance-Leahy Scale: Good     Standing balance support: Bilateral upper extremity supported, During functional activity, Reliant on assistive device for balance Standing balance-Leahy Scale: Poor Standing balance comment: reliant on UE support     Communication Communication Communication: Impaired Factors Affecting Communication: Hearing impaired;Other (comment) (very HOH, per daughter 0% hear in L ear, 4% hearing in R ear; has hearing aids)  Cognition Arousal: Alert Behavior During Therapy: WFL for tasks assessed/performed   PT - Cognitive impairments: No apparent impairments    Following commands: Intact      Cueing Cueing Techniques: Verbal cues, Gestural cues, Tactile cues         Pertinent Vitals/Pain Pain Assessment Pain Assessment: No/denies pain     PT Goals (current goals can now be found in the care plan section) Acute Rehab PT Goals PT Goal Formulation: With patient/family Time For Goal Achievement: 04/15/24 Potential to Achieve Goals: Good Progress towards PT goals: Progressing toward goals    Frequency    Min 2X/week       AM-PAC PT 6 Clicks Mobility   Outcome Measure  Help needed turning from your back to your side while in a flat bed without using bedrails?: None Help needed moving from lying  on your back to sitting on the side of a flat bed without using bedrails?: None Help needed moving to and from a bed to a chair (including a wheelchair)?:  A Little Help needed standing up from a chair using your arms (e.g., wheelchair or bedside chair)?: A Little Help needed to walk in hospital room?: A Little Help needed climbing 3-5 steps with a railing? : A Lot 6 Click Score: 19    End of Session   Activity Tolerance: Other (comment);Patient limited by fatigue (limited by elevated HR) Patient left: in bed;with call bell/phone within reach Nurse Communication: Mobility status;Other (comment) (HR) PT Visit Diagnosis: Unsteadiness on feet (R26.81);Muscle weakness (generalized) (M62.81)     Time: 8875-8865 PT Time Calculation (min) (ACUTE ONLY): 10 min  Charges:    $Gait Training: 8-22 mins PT General Charges $$ ACUTE PT VISIT: 1 Visit                    Kate ORN, PT, DPT Secure Chat Preferred  Rehab Office [phone]   Kate BRAVO Wendolyn 04/03/2024, 12:10 PM

## 2024-04-03 NOTE — Progress Notes (Signed)
 Nutrition Follow-up  DOCUMENTATION CODES:  Severe malnutrition in context of chronic illness  INTERVENTION:  Continue regular diet. Continue Ensure Plus High Protein PO TID. Each supplement provides 350 Kcals and 20 grams of protein. Continue Multivitamin PO once daily. Continue 100 mg thiamine  PO once daily for 7 days.  NUTRITION DIAGNOSIS:  Severe Malnutrition related to chronic illness as evidenced by severe fat depletion, severe muscle depletion, percent weight loss. Diagnosis updated with newly acquired information.  GOAL:  Patient will meet greater than or equal to 90% of their needs - progressing  MONITOR:  PO intake, Supplement acceptance, Labs, Weight trends  REASON FOR ASSESSMENT:  Follow-up for: Consult Assessment of nutrition requirement/status  ASSESSMENT:  Patient presented with chest pain, nausea and shortness of breath and was found to have Afib, tachycardia, and acute CHF exacerbation. PMH significant for CHF, CAD, HTN, pAfib, dyslipiemia, stroke with hemiparesis 2017, prostate CA, CKD3, vertebral stenosis, HOH, hiatal hernia, arthritis.  12/16 cardiology notes patient desires to not undergo invasive procedures 12/16 GOC discussion with palliative = DNR/DNI with current supportive care, daughter OK with hospice when time is right  Visited the patient with daughter Darice at bedside. The patient is extremely HOH. The patient is finishing 50-100% of his meals, half a sandwich from lunch remains on bedside tray. He is drinking one Ensure daily and even though he only likes vanilla flavor, he has been forcing himself to drink the chocolate that has been provided to him since the floor is out of vanilla. He ate well PTA at the independent living facility. His UBW is 140-142 lbs which he last weighed about a month ago. They have both noticed recent weight loss on him. He utilizes a walker to ambulate. Darice notes that the patient has become more sedentary recently and she  often sees him sitting down and playing games with her aunt who also lives at the facility with him. She notes he is very mentally sound and he certainly appears so to me as well. Encouraged the patient to drink more than one ONS daily if he can and continue to finish his meals and he agreed. Discussed purchasing ONS with higher nutrient density such as Boost VHC with Darice.  Typical day's intake: Breakfast - muffin, oatmeal, boiled eggs, Ensure Lunch - 1/2 grilled cheese with soup Dinner - chicken or fish meal ONS - one Ensure daily  Scheduled Meds:  [START ON 04/15/2024] amiodarone   200 mg Oral Daily   amiodarone   400 mg Oral BID   [START ON 04/10/2024] amiodarone   400 mg Oral Daily   apixaban   2.5 mg Oral BID   feeding supplement  237 mL Oral TID BM   finasteride   5 mg Oral Daily   furosemide   40 mg Oral Daily   metoprolol  succinate  50 mg Oral BID   multivitamin with minerals  1 tablet Oral Daily   pantoprazole   40 mg Oral Daily   rosuvastatin   10 mg Oral Daily   sodium chloride  flush  3 mL Intravenous Q12H   tamsulosin   0.4 mg Oral Daily   thiamine   100 mg Oral Daily   Continuous Infusions: PRN Meds:.acetaminophen  **OR** acetaminophen , melatonin, metoprolol  tartrate  Diet Order             Diet regular Room service appropriate? Yes with Assist; Fluid consistency: Thin  Diet effective now                  Meal Intake: 50-100%  Labs:  Latest Ref Rng & Units 04/03/2024    5:35 AM 04/01/2024    5:47 AM 03/31/2024    5:38 AM  CMP  Glucose 70 - 99 mg/dL 88  90  87   BUN 8 - 23 mg/dL 38  36  29   Creatinine 0.61 - 1.24 mg/dL 8.29  8.22  8.46   Sodium 135 - 145 mmol/L 138  137  137   Potassium 3.5 - 5.1 mmol/L 4.1  3.6  3.9   Chloride 98 - 111 mmol/L 103  103  103   CO2 22 - 32 mmol/L 24  23  23    Calcium  8.9 - 10.3 mg/dL 8.7  8.5  8.3     I/O: +3.5 L since admit  NUTRITION - FOCUSED PHYSICAL EXAM: Flowsheet Row Most Recent Value  Orbital Region Severe  depletion  Upper Arm Region Severe depletion  Thoracic and Lumbar Region Moderate depletion  Buccal Region Severe depletion  Temple Region Severe depletion  Clavicle Bone Region Severe depletion  Clavicle and Acromion Bone Region Severe depletion  Scapular Bone Region Severe depletion  Dorsal Hand Severe depletion  Patellar Region Severe depletion  Anterior Thigh Region Severe depletion  Posterior Calf Region Severe depletion  Edema (RD Assessment) Mild  [+1 BLE]  Hair Reviewed  Eyes Reviewed  Mouth Reviewed  Skin Reviewed  Nails Reviewed    EDUCATION NEEDS:  Education needs have been addressed  Skin:  Skin Assessment: Reviewed RN Assessment  Last BM:  12/17  Height:  Ht Readings from Last 1 Encounters:  03/29/24 5' 9 (1.753 m)   Weight:  Wt Readings from Last 10 Encounters:  04/03/24 58.2 kg  07/26/23 64.9 kg  06/06/23 63.7 kg  02/13/23 62.8 kg  12/07/22 64.7 kg  08/01/22 64.1 kg  07/03/22 68 kg  06/08/22 62.7 kg  05/03/22 65.1 kg  04/29/22 65 kg   Weight Change: 5 Kg (8.5%) loss in 1 month = clinically significant  Usual Body Weight: 140-142 lbs (63.6-64.5 Kg)  Edema: +1 BLE  Ideal Body Weight:  72.7 kg   BMI:  Body mass index is 18.95 kg/m.  Estimated Nutritional Needs:  Kcal:  1400-1600 kcal Protein:  75-100 gm Fluid:  >1.4L/day    Leverne Ruth, MS, RDN, LDN [place]. [facility] See AMION for contact information Secure chat preferred

## 2024-04-03 NOTE — Progress Notes (Signed)
 PROGRESS NOTE    Richard Reed  FMW:[id number] DOB: 09-05-1925 DOA: 03/29/2024 PCP: Marvene Prentice SAUNDERS, FNP    Brief Narrative:  Richard Reed is a 89 y.o. male from ILF with medical history significant of PAF on low-dose Eliquis , history of prostate cancer, CAD involving the RCA medical therapy only with cardiac cath in 2012, chronic kidney disease stage III known vertebrobasilar stenosis, hypertension, dyslipidemia, BPH on Flomax ,HFrEF with a component of diastolic dysfunction, mild to moderate MR, moderate AR, moderate AS.  He developed abrupt onset of midsternal chest pain that radiated to the back this was associated with severe nausea diaphoresis as well as shortness of breath.  EMS was subsequently called and patient came to the emergency room. Upon presentation to the ED he was in A-fib with RVR with ventricular responses in the 130-140 range.  Cardiologist consulted and medications being changed.  Plan to return to independent living facility in the a.m.   Assessment and Plan: Atrial fibrillation with RVR Ventricular tachycardia Cardiologist on board.  Appreciate input Currently on amiodarone  drip but there is plans to switch to oral amiodarone  on 12/16 Patient also had episodes of ventricular tachycardia in the field requiring defibrillation 360 J x 1. Continue home Eliquis  Keep magnesium  and potassium within normal limits Metoprolol  added  by cardiologist-blood pressure improved so will increase metoprolol    Acute exacerbation of HFrEF Diastolic dysfunction Known mild to moderate MR, moderate AR, and moderate AS per echo 2021 Suspect secondary to at least one week of RVR based on history given by patient and family Continue IV Lasix  40 mg twice daily with potassium supplementation-follow electrolyte Holding calcium  channel blocker  -echocardiogram: Left ventricular ejection fraction, by estimation, is 25 to 30%. severely decreased function.  Grade II diastolic dysfunction   Strict intake and output and daily weights   CAD with RCA 40 to 60% ostial in 2012, medically managed Continue statin therapy as well as metoprolol    Hypertension Continue current antihypertensives about   CKD stage IIIa Baseline creatinine was 1.15 in January 2024 and in March 2025 was 1.54 -creatinine baseline is 1.5    Thrombocytopenia Documented in March 2024 with platelets 119,000 No signs of bleeding Continue to follow   Known vertebrobasilar stenosis Currently asymptomatic   History of prostate cancer/BPH Continue Flomax    HLD Continue Crestor    Protein calorie malnutrition Last known weight was 61.2 kg with a BMI of 19.9 Nutrition consultation Ensure supplementation   Physical deconditioning According to family patient will walk from his room at the ILF to the common area where he sits on the couch most days PT/OT evaluation-Home health-TOC has arranged  Palliative care saw patient on 12/16     DVT prophylaxis: apixaban  (ELIQUIS ) tablet 2.5 mg Start: 03/29/24 1600 apixaban  (ELIQUIS ) tablet 2.5 mg    Code Status: Full Code Updated daughter and told her that he would be discharged tomorrow morning  Disposition Plan:  Level of care: Telemetry Status is: Inpatient     Consultants:  cards   Subjective: States he is feeling weak this morning-PT evaluated him and doing well  Objective: Vitals:   04/02/24 2356 04/03/24 0449 04/03/24 0828 04/03/24 1055  BP: 109/82 126/72 (!) 137/92 112/68  Pulse: (!) 48 77 (!) 101 (!) 48  Resp: 16 16 18    Temp: 98.4 F (36.9 C) 98.4 F (36.9 C) 98.3 F (36.8 C)   TempSrc: Oral Oral Oral   SpO2: 96% 95% 95% 91%  Weight:  58.2 kg  Height:        Intake/Output Summary (Last 24 hours) at 04/03/2024 1146 Last data filed at 04/03/2024 1052 Gross per 24 hour  Intake 483 ml  Output --  Net 483 ml   Filed Weights   04/01/24 0412 04/02/24 0427 04/03/24 0449  Weight: 60.9 kg 58.2 kg 58.2 kg     Examination:   General: Appearance:    Thin male in no acute distress     Lungs:     respirations unlabored  Heart:    Bradycardic. irreg     MS:   All extremities are intact.    Neurologic:   Awake, alert       Data Reviewed: I have personally reviewed following labs and imaging studies  CBC: Recent Labs  Lab 03/29/24 0955 03/29/24 1012 03/31/24 0538 04/01/24 0547 04/03/24 0535  WBC 4.7  --  4.3 4.2 4.3  NEUTROABS 3.5  --   --   --   --   HGB 13.5 13.6 13.9 13.8 14.1  HCT 40.9 40.0 40.6 40.3 41.1  MCV 98.3  --  96.0 96.0 94.3  PLT 116*  --  108* 107* 102*   Basic Metabolic Panel: Recent Labs  Lab 03/29/24 0955 03/29/24 1012 03/30/24 0349 03/31/24 0538 04/01/24 0547 04/03/24 0535  NA 141 143 140 137 137 138  K 3.2* 3.7 4.5 3.9 3.6 4.1  CL 112* 108 108 103 103 103  CO2 19*  --  27 23 23 24   GLUCOSE 104* 112* 106* 87 90 88  BUN 31* 35* 33* 29* 36* 38*  CREATININE 1.30* 1.60* 1.58* 1.53* 1.77* 1.70*  CALCIUM  7.0*  --  8.8* 8.3* 8.5* 8.7*  MG 1.8  --  2.1 2.0 1.9  --    GFR: Estimated Creatinine Clearance: 20.9 mL/min (A) (by C-G formula based on SCr of 1.7 mg/dL (H)). Liver Function Tests: Recent Labs  Lab 03/29/24 0955  AST 24  ALT 33  ALKPHOS 53  BILITOT 1.3*  PROT 5.0*  ALBUMIN 3.0*   No results for input(s): LIPASE, AMYLASE in the last 168 hours. No results for input(s): AMMONIA in the last 168 hours. Coagulation Profile: No results for input(s): INR, PROTIME in the last 168 hours. Cardiac Enzymes: No results for input(s): CKTOTAL, CKMB, CKMBINDEX, TROPONINI in the last 168 hours. BNP (last 3 results) No results for input(s): PROBNP in the last 8760 hours. HbA1C: No results for input(s): HGBA1C in the last 72 hours. CBG: No results for input(s): GLUCAP in the last 168 hours. Lipid Profile: No results for input(s): CHOL, HDL, LDLCALC, TRIG, CHOLHDL, LDLDIRECT in the last 72 hours. Thyroid  Function  Tests: No results for input(s): TSH, T4TOTAL, FREET4, T3FREE, THYROIDAB in the last 72 hours.  Anemia Panel: No results for input(s): VITAMINB12, FOLATE, FERRITIN, TIBC, IRON, RETICCTPCT in the last 72 hours. Sepsis Labs: Recent Labs  Lab 03/29/24 1013 03/29/24 1150  LATICACIDVEN 2.0* 1.4    No results found for this or any previous visit (from the past 240 hours).       Radiology Studies: No results found.       Scheduled Meds:  [START ON 04/15/2024] amiodarone   200 mg Oral Daily   amiodarone   400 mg Oral BID   [START ON 04/10/2024] amiodarone   400 mg Oral Daily   apixaban   2.5 mg Oral BID   feeding supplement  237 mL Oral TID BM   finasteride   5 mg Oral Daily   furosemide   40 mg Oral  Daily   metoprolol  succinate  50 mg Oral BID   multivitamin with minerals  1 tablet Oral Daily   pantoprazole   40 mg Oral Daily   rosuvastatin   10 mg Oral Daily   sodium chloride  flush  3 mL Intravenous Q12H   tamsulosin   0.4 mg Oral Daily   thiamine   100 mg Oral Daily   Continuous Infusions:     LOS: 5 days    Time spent: 45 minutes spent on chart review, discussion with nursing staff, consultants, updating family and interview/physical exam; more than 50% of that time was spent in counseling and/or coordination of care.    Harlene RAYMOND Bowl, DO [facility] Available via Epic secure chat 7am-7pm After these hours, please refer to coverage provider listed on [email or web address] 04/03/2024, 11:46 AM

## 2024-04-03 NOTE — Plan of Care (Signed)
°  Problem: Health Behavior/Discharge Planning: Goal: Ability to manage health-related needs will improve Outcome: Progressing   Problem: Clinical Measurements: Goal: Will remain free from infection Outcome: Progressing Goal: Respiratory complications will improve Outcome: Progressing   Problem: Activity: Goal: Risk for activity intolerance will decrease Outcome: Progressing   Problem: Nutrition: Goal: Adequate nutrition will be maintained Outcome: Progressing   Problem: Coping: Goal: Level of anxiety will decrease Outcome: Progressing   Problem: Pain Managment: Goal: General experience of comfort will improve and/or be controlled Outcome: Progressing   Problem: Safety: Goal: Ability to remain free from injury will improve Outcome: Progressing

## 2024-04-04 ENCOUNTER — Other Ambulatory Visit (HOSPITAL_COMMUNITY): Payer: Self-pay

## 2024-04-04 DIAGNOSIS — I4891 Unspecified atrial fibrillation: Secondary | ICD-10-CM | POA: Diagnosis not present

## 2024-04-04 MED ORDER — AMIODARONE HCL 200 MG PO TABS
ORAL_TABLET | ORAL | 0 refills | Status: DC
  Filled 2024-04-04: qty 60, 40d supply, fill #0

## 2024-04-04 MED ORDER — FUROSEMIDE 40 MG PO TABS
40.0000 mg | ORAL_TABLET | Freq: Every day | ORAL | 0 refills | Status: DC
  Filled 2024-04-04: qty 30, 30d supply, fill #0

## 2024-04-04 MED ORDER — METOPROLOL SUCCINATE ER 50 MG PO TB24
50.0000 mg | ORAL_TABLET | Freq: Two times a day (BID) | ORAL | 0 refills | Status: DC
  Filled 2024-04-04: qty 60, 30d supply, fill #0

## 2024-04-04 NOTE — Plan of Care (Signed)
  Problem: Clinical Measurements: Goal: Will remain free from infection Outcome: Progressing Goal: Respiratory complications will improve Outcome: Progressing   Problem: Activity: Goal: Risk for activity intolerance will decrease Outcome: Progressing   Problem: Nutrition: Goal: Adequate nutrition will be maintained Outcome: Progressing   Problem: Coping: Goal: Level of anxiety will decrease Outcome: Progressing   Problem: Pain Managment: Goal: General experience of comfort will improve and/or be controlled Outcome: Progressing   Problem: Safety: Goal: Ability to remain free from injury will improve Outcome: Progressing

## 2024-04-04 NOTE — TOC Transition Note (Signed)
 Transition of Care [facility]) - Discharge Note   Patient Details  Name: Richard Reed MRN: [id number] Date of Birth: [birth year]  [facility] CM/SW Contact:  Sudie Erminio Deems, RN Phone Number: 04/04/2024, 10:21 AM   Clinical Narrative: Clinicals submitted to [facility] for PT/OT. No further needs identified at this time.     Final next level of care: Home w Home Health Services Barriers to Discharge: No Barriers Identified   Patient Goals and CMS Choice Patient states their goals for this hospitalization and ongoing recovery are:: plans to return to [facility]   Choice offered to / list presented to : Adult Children (Daughter chose the [facility]/OT)      Discharge Placement                       Discharge Plan and Services Additional resources added to the After Visit Summary for     Discharge Planning Services: CM Consult Post Acute Care Choice: Home Health          DME Arranged: Vannie rolling DME Agency: [employer] Date DME Agency Contacted: 04/02/24 Time DME Agency Contacted: 1230 Representative spoke with at DME Agency: [facility] Arranged: OT, PT HH Agency:  [employer]development Worker)        Social Drivers of Health (SDOH) Interventions SDOH Screenings   Food Insecurity: No Food Insecurity (03/30/2024)  Housing: Low Risk (03/30/2024)  Transportation Needs: No Transportation Needs (03/30/2024)  Utilities: Not At Risk (03/30/2024)  Social Connections: Moderately Isolated (03/30/2024)  Tobacco Use: Low Risk (03/30/2024)     Readmission Risk Interventions     No data to display

## 2024-04-04 NOTE — Discharge Summary (Signed)
 Physician Discharge Summary  Richard Reed FMW:[id number] DOB: [birth year] DOA: 03/29/2024  PCP: Marvene Prentice SAUNDERS, FNP  Admit date: 03/29/2024 Discharge date: 04/04/2024  Admitted From:  Discharge disposition: ILF   Recommendations for Outpatient Follow-Up:   outpatient palliative, but daughter wants to have [facility] make this referral  Continue to adjust BB for better HR control Amiodarone  taper BMP 1 week   Discharge Diagnosis:   Principal Problem:   Atrial fibrillation with RVR (HCC) Active Problems:   Acute on chronic combined systolic and diastolic CHF (congestive heart failure) (HCC)   V-tach (HCC)   Protein-calorie malnutrition, severe    Discharge Condition: Improved.  Diet recommendation: Regular.  Wound care: None.  Code status: DNR   History of Present Illness:   Richard Reed is a 89 y.o. male with medical history significant of PAF on low-dose Eliquis , history of prostate cancer, CAD involving the RCA medical therapy only with cardiac cath in 2012, chronic kidney disease stage III known vertebrobasilar stenosis, hypertension, dyslipidemia, BPH on Flomax ,HFrEF with a component of diastolic dysfunction, mild to moderate MR, moderate AR, moderate AS.  Patient is very hard of hearing.  Patient was in his usual state of health.  He reports that he went to bed feeling okay.  He woke up at about 4 AM to go to the restroom and noticed his pajamas were wet.  At that point he attempted to go back to sleep but was having difficulty.  Eventually he developed abrupt onset of midsternal chest pain that radiated to the back this was associated with severe nausea.  His sweatiness returned and he described his eyes being dimmed.  He felt like he needed to catch his breath.  He also reports at least 1 week of some tachypalpitations and increasing edema in his lower extremities.  Because of the symptoms EMS was called to the house.  Initially the patient was found  laying on the floor and was found to be in SVT but he became unresponsive with very weak carotid pulse and rhythm had changed to ventricular tachycardia.  He was defibrillated at 360 J x 1 in the field.  Upon presentation to the ED he was in A-fib with RVR with ventricular responses in the 130-140 range.  Of note patient has low baseline systolic blood pressure in the 90s.  Because of the lower pressure and the increased heart rate there were some concerns patient could be dehydrated so he was given a 500 cc fluid challenge by the ED.  Because his blood pressure was suboptimal EDP deferred administration of calcium  channel blockers.  Chest x-ray did demonstrate small left pleural effusion with overlying left lower lobe consolidation, atelectasis or infiltrate.  Patient and family denied any upper respiratory symptoms including fevers chills or cough.  Cardiology has been consulted and will be seeing the patient.  Hospitalist service has been asked to evaluate the patient for admission.   All of the above history has been confirmed per my evaluation of the patient.  During my examination cardiologist presented to the room.  We reviewed his out-of-hospital telemetry strips.  We examined the patient together.  Cardiologist recommended initiation of an amiodarone  infusion which the EDP has started, Lasix  has been recommended and Eliquis  has been resumed.  Given suboptimal blood pressure readings home Lopressor  and Cardizem  have not been resumed at this time.  Echocardiogram has been deferred based on ongoing tachycardia.  At this time patient wishes to remain a  full code.   Hospital Course by Problem:   Atrial fibrillation with RVR Ventricular tachycardia Cardiologist on board.  Appreciate input Currently on amiodarone  drip but there is plans to switch to oral amiodarone  on 12/16 Patient also had episodes of ventricular tachycardia in the field requiring defibrillation 360 J x 1. Continue home Eliquis  Keep  magnesium  and potassium within normal limits Metoprolol  added  by cardiologist-blood pressure improved so will increase metoprolol -- tolerating on day of d/c   Acute exacerbation of HFrEF Diastolic dysfunction Known mild to moderate MR, moderate AR, and moderate AS per echo 2021 Suspect secondary to at least one week of RVR based on history given by patient and family givenIV Lasix  40 mg twice daily with potassium supplementation-follow electrolyte Holding calcium  channel blocker  -echocardiogram: Left ventricular ejection fraction, by estimation, is 25 to 30%. severely decreased function.  Grade II diastolic dysfunction  -cards recommends 40mg  daily   CAD with RCA 40 to 60% ostial in 2012, medically managed Continue statin therapy as well as metoprolol    Hypertension Continue current antihypertensives about   CKD stage IIIa Baseline creatinine was 1.15 in January 2024 and in March 2025 was 1.54 -creatinine baseline is 1.5     Thrombocytopenia Documented in March 2024 with platelets 119,000 No signs of bleeding Continue to follow outpatient   Known vertebrobasilar stenosis Currently asymptomatic   History of prostate cancer/BPH Continue Flomax    HLD Continue Crestor    Protein calorie malnutrition Last known weight was 61.2 kg with a BMI of 19.9 Nutrition consultation Ensure supplementation   Physical deconditioning According to family patient will walk from his room at the ILF to the common area where he sits on the couch most days PT/OT evaluation-Home health-TOC has arranged   Palliative care saw patient on 12/16-outpatient follow up    Medical Consultants:   cards   Discharge Exam:   Vitals:   04/04/24 0525 04/04/24 0842  BP: 116/76 123/79  Pulse: (!) 47   Resp:  18  Temp: 97.8 F (36.6 C) 98 F (36.7 C)  SpO2: 99%    Vitals:   04/03/24 2333 04/04/24 0524 04/04/24 0525 04/04/24 0842  BP:   116/76 123/79  Pulse: 76 80 (!) 47   Resp:    18   Temp:   97.8 F (36.6 C) 98 F (36.7 C)  TempSrc:   Oral Oral  SpO2: 98%  99%   Weight:      Height:        General exam: Appears calm and comfortable.    The results of significant diagnostics from this hospitalization (including imaging, microbiology, ancillary and laboratory) are listed below for reference.     Procedures and Diagnostic Studies:   ECHOCARDIOGRAM COMPLETE Result Date: 03/30/2024    ECHOCARDIOGRAM REPORT   Patient Name:   Richard Reed Date of Exam: 03/30/2024 Medical Rec #:  [id number]      Height:       69.0 in Accession #:    [id number]     Weight:       137.1 lb Date of Birth:  08-22-1925      BSA:          1.760 m Patient Age:    89 years       BP:           113/83 mmHg Patient Gender: M              HR:  85 bpm. Exam Location:  Inpatient Procedure: 2D Echo, Cardiac Doppler and Color Doppler (Both Spectral and Color            Flow Doppler were utilized during procedure).                              MODIFIED REPORT: This report was modified by Redell Shallow MD on 03/30/2024 due to Changes.  Indications:     CHF Acute Systolic I50.21  History:         Patient has prior history of Echocardiogram examinations, most                  recent 09/30/2019. Aortic Valve Disease; Arrythmias:Atrial                  Fibrillation.  Sonographer:     Nathanel Devonshire Referring Phys:  [id number] ISAIAH CROME ELLIS Diagnosing Phys: Redell Shallow MD IMPRESSIONS  1. Global hypokinesis with akinesis of the inferolateral wall; overall severe LV dysfunction.  2. Left ventricular ejection fraction, by estimation, is 25 to 30%. The left ventricle has severely decreased function. The left ventricle demonstrates regional wall motion abnormalities (see scoring diagram/findings for description). Left ventricular diastolic parameters are consistent with Grade II diastolic dysfunction (pseudonormalization). Elevated left atrial pressure.  3. Right ventricular systolic function is normal. The right  ventricular size is normal. There is mildly elevated pulmonary artery systolic pressure.  4. Left atrial size was moderately dilated.  5. Right atrial size was moderately dilated.  6. The mitral valve is normal in structure. Mild mitral valve regurgitation. No evidence of mitral stenosis.  7. The aortic valve is tricuspid. Aortic valve regurgitation is mild. Severe aortic valve stenosis.  8. There is moderate dilatation of the ascending aorta, measuring 47 mm.  9. The inferior vena cava is normal in size with greater than 50% respiratory variability, suggesting right atrial pressure of 3 mmHg. Conclusion(s)/Recommendation(s): No evidence of valvular vegetations on this transthoracic echocardiogram. Consider a transesophageal echocardiogram to exclude infective endocarditis if clinically indicated. FINDINGS  Left Ventricle: Left ventricular ejection fraction, by estimation, is 25 to 30%. The left ventricle has severely decreased function. The left ventricle demonstrates regional wall motion abnormalities. The left ventricular internal cavity size was normal  in size. There is no left ventricular hypertrophy. Left ventricular diastolic parameters are consistent with Grade II diastolic dysfunction (pseudonormalization). Elevated left atrial pressure. Right Ventricle: The right ventricular size is normal. Right ventricular systolic function is normal. There is mildly elevated pulmonary artery systolic pressure. The tricuspid regurgitant velocity is 2.99 m/s, and with an assumed right atrial pressure of 3 mmHg, the estimated right ventricular systolic pressure is 38.8 mmHg. Left Atrium: Left atrial size was moderately dilated. Right Atrium: Right atrial size was moderately dilated. Pericardium: There is no evidence of pericardial effusion. Mitral Valve: The mitral valve is normal in structure. Mild mitral annular calcification. Mild mitral valve regurgitation. No evidence of mitral valve stenosis. MV peak gradient, 7.6  mmHg. The mean mitral valve gradient is 3.0 mmHg. Tricuspid Valve: The tricuspid valve is normal in structure. Tricuspid valve regurgitation is mild . No evidence of tricuspid stenosis. Aortic Valve: The aortic valve is tricuspid. Aortic valve regurgitation is mild. Aortic regurgitation PHT measures 582 msec. Severe aortic stenosis is present. Aortic valve mean gradient measures 19.0 mmHg. Aortic valve peak gradient measures 29.5 mmHg. Aortic valve area, by VTI measures 0.91 cm. Pulmonic Valve: The  pulmonic valve was normal in structure. Pulmonic valve regurgitation is trivial. No evidence of pulmonic stenosis. Aorta: The aortic root is normal in size and structure. There is moderate dilatation of the ascending aorta, measuring 47 mm. Venous: The inferior vena cava is normal in size with greater than 50% respiratory variability, suggesting right atrial pressure of 3 mmHg. IAS/Shunts: No atrial level shunt detected by color flow Doppler. Additional Comments: Global hypokinesis with akinesis of the inferolateral wall; overall severe LV dysfunction.  LEFT VENTRICLE PLAX 2D LVIDd:         5.00 cm      Diastology LVIDs:         4.20 cm      LV e' medial:    3.16 cm/s LV PW:         1.00 cm      LV E/e' medial:  37.1 LV IVS:        1.20 cm      LV e' lateral:   5.44 cm/s LVOT diam:     2.10 cm      LV E/e' lateral: 21.5 LV SV:         54 LV SV Index:   31 LVOT Area:     3.46 cm  LV Volumes (MOD) LV vol d, MOD A2C: 146.0 ml LV vol d, MOD A4C: 108.0 ml LV vol s, MOD A2C: 83.1 ml LV vol s, MOD A4C: 71.4 ml LV SV MOD A2C:     62.9 ml LV SV MOD A4C:     108.0 ml LV SV MOD BP:      48.7 ml RIGHT VENTRICLE            IVC RV Basal diam:  3.60 cm    IVC diam: 1.80 cm RV S prime:     8.27 cm/s TAPSE (M-mode): 1.4 cm LEFT ATRIUM             Index        RIGHT ATRIUM           Index LA diam:        3.50 cm 1.99 cm/m   RA Area:     20.50 cm LA Vol (A2C):   91.5 ml 51.99 ml/m  RA Volume:   68.10 ml  38.70 ml/m LA Vol (A4C):    60.9 ml 34.61 ml/m LA Biplane Vol: 76.4 ml 43.41 ml/m  AORTIC VALVE AV Area (Vmax):    0.85 cm AV Area (Vmean):   0.73 cm AV Area (VTI):     0.91 cm AV Vmax:           271.50 cm/s AV Vmean:          206.000 cm/s AV VTI:            0.592 m AV Peak Grad:      29.5 mmHg AV Mean Grad:      19.0 mmHg LVOT Vmax:         66.30 cm/s LVOT Vmean:        43.700 cm/s LVOT VTI:          0.155 m LVOT/AV VTI ratio: 0.26 AI PHT:            582 msec  AORTA Ao Root diam: 3.80 cm Ao Asc diam:  4.70 cm MITRAL VALVE                TRICUSPID VALVE MV Area (PHT): 5.02 cm     TR Peak  grad:   35.8 mmHg MV Area VTI:   1.78 cm     TR Vmax:        299.00 cm/s MV Peak grad:  7.6 mmHg MV Mean grad:  3.0 mmHg     SHUNTS MV Vmax:       1.38 m/s     Systemic VTI:  0.16 m MV Vmean:      85.1 cm/s    Systemic Diam: 2.10 cm MV Decel Time: 151 msec MV E velocity: 117.00 cm/s MV A velocity: 97.90 cm/s MV E/A ratio:  1.20 Redell Shallow MD Electronically signed by Redell Shallow MD Signature Date/Time: 03/30/2024/3:48:22 PM    Final (Updated)    DG Chest Portable 1 View Result Date: 03/29/2024 EXAM: 1 VIEW(S) XRAY OF THE CHEST 03/29/2024 10:02:00 AM COMPARISON: 01/24/2024 CLINICAL HISTORY: Ventricular tachycardia status post shock, A-fib with RVR, syncope, hypotension. FINDINGS: LINES, TUBES AND DEVICES: Monitor wires noted. LUNGS AND PLEURA: Small left pleural effusion and overlying left lower lobe consolidation, atelectasis versus infiltrate. Calcified pleural plaques in left mid field and calcified pleural plaque overlying right hemidiaphragm, unchanged. No pneumothorax. HEART AND MEDIASTINUM: Stable mediastinum with aortic atherosclerosis and tortuosity. Central vascular prominence without overt edema. No acute abnormality of the cardiac and mediastinal silhouettes. BONES AND SOFT TISSUES: Osteopenia and thoracic spondylosis. Mild upper thoracic levoscoliosis again noted. IMPRESSION: 1. Small left pleural effusion with overlying left lower  lobe consolidation, atelectasis versus infiltrate. 2. Chronic calcified pleural plaques bilaterally, unchanged. Electronically signed by: Dorethia Molt MD 03/29/2024 10:25 AM EST RP Workstation: [id number]     Labs:   Basic Metabolic Panel: Recent Labs  Lab 03/29/24 0955 03/29/24 1012 03/30/24 0349 03/31/24 0538 04/01/24 0547 04/03/24 0535  NA 141 143 140 137 137 138  K 3.2* 3.7 4.5 3.9 3.6 4.1  CL 112* 108 108 103 103 103  CO2 19*  --  27 23 23 24   GLUCOSE 104* 112* 106* 87 90 88  BUN 31* 35* 33* 29* 36* 38*  CREATININE 1.30* 1.60* 1.58* 1.53* 1.77* 1.70*  CALCIUM  7.0*  --  8.8* 8.3* 8.5* 8.7*  MG 1.8  --  2.1 2.0 1.9  --    GFR Estimated Creatinine Clearance: 20.9 mL/min (A) (by C-G formula based on SCr of 1.7 mg/dL (H)). Liver Function Tests: Recent Labs  Lab 03/29/24 0955  AST 24  ALT 33  ALKPHOS 53  BILITOT 1.3*  PROT 5.0*  ALBUMIN 3.0*   No results for input(s): LIPASE, AMYLASE in the last 168 hours. No results for input(s): AMMONIA in the last 168 hours. Coagulation profile No results for input(s): INR, PROTIME in the last 168 hours.  CBC: Recent Labs  Lab 03/29/24 0955 03/29/24 1012 03/31/24 0538 04/01/24 0547 04/03/24 0535  WBC 4.7  --  4.3 4.2 4.3  NEUTROABS 3.5  --   --   --   --   HGB 13.5 13.6 13.9 13.8 14.1  HCT 40.9 40.0 40.6 40.3 41.1  MCV 98.3  --  96.0 96.0 94.3  PLT 116*  --  108* 107* 102*   Cardiac Enzymes: No results for input(s): CKTOTAL, CKMB, CKMBINDEX, TROPONINI in the last 168 hours. BNP: Invalid input(s): POCBNP CBG: No results for input(s): GLUCAP in the last 168 hours. D-Dimer No results for input(s): DDIMER in the last 72 hours. Hgb A1c No results for input(s): HGBA1C in the last 72 hours. Lipid Profile No results for input(s): CHOL, HDL, LDLCALC, TRIG, CHOLHDL, LDLDIRECT in the last 72 hours.  Thyroid  function studies No results for input(s): TSH, T4TOTAL, T3FREE,  THYROIDAB in the last 72 hours.  Invalid input(s): FREET3 Anemia work up No results for input(s): VITAMINB12, FOLATE, FERRITIN, TIBC, IRON, RETICCTPCT in the last 72 hours. Microbiology No results found for this or any previous visit (from the past 240 hours).   Discharge Instructions:   Discharge Instructions     Diet general   Complete by: As directed    Avoid salt   Increase activity slowly   Complete by: As directed       Allergies as of 04/04/2024   No Known Allergies      Medication List     STOP taking these medications    cephALEXin  500 MG capsule Commonly known as: KEFLEX    diltiazem  180 MG 24 hr capsule Commonly known as: CARDIZEM  CD   doxycycline  100 MG capsule Commonly known as: VIBRAMYCIN    Lagevrio  200 MG Caps capsule Generic drug: molnupiravir  EUA   methylPREDNISolone  4 MG Tbpk tablet Commonly known as: MEDROL  DOSEPAK   metoprolol  tartrate 25 MG tablet Commonly known as: LOPRESSOR        TAKE these medications    amiodarone  200 MG tablet Commonly known as: PACERONE  Take 2 tablets (400 mg total) 2 (two) times daily for 5 days, THEN 2 tablets (400 mg total) daily for 5 days, THEN 1 tablet (200 mg total) daily thereafter Start taking on: April 04, 2024   apixaban  2.5 MG Tabs tablet Commonly known as: ELIQUIS  Take 1 tablet (2.5 mg total) by mouth 2 (two) times daily.   erythromycin  ophthalmic ointment APPLY INTO THE LOWER CONJUNCTIVAL SAC IN RIGHT EYE BY OPHTHALMIC ROUTE 2-3 TIMES PER DAY   feeding supplement Liqd Take 237 mLs by mouth 2 (two) times daily between meals. What changed: when to take this   finasteride  5 MG tablet Commonly known as: PROSCAR  Take 1 tablet (5 mg total) by mouth daily.   furosemide  40 MG tablet Commonly known as: LASIX  Take 1 tablet (40 mg total) by mouth daily. What changed:  medication strength how much to take   metoprolol  succinate 50 MG 24 hr tablet Commonly known as:  TOPROL -XL Take 1 tablet (50 mg total) by mouth 2 (two) times daily. Take with or immediately following a meal.   neomycin -polymyxin b -dexamethasone  3.5-10000-0.1 Susp Commonly known as: MAXITROL  Place 1 drop into the right eye every 4 (four) hours for 7 days, THEN 1 drop every 8 (eight) hours for 7 days then stop. Start taking on: March 20, 2024 What changed: See the new instructions.   pantoprazole  40 MG tablet Commonly known as: PROTONIX  Take 1 tablet (40 mg total) by mouth daily with a meal.   PATADAY OP Place 1 drop into the left eye daily as needed (dry eye).   PreserVision AREDS 2 Caps Take 1 capsule by mouth in the morning and at bedtime.   rosuvastatin  10 MG tablet Commonly known as: CRESTOR  Take 1 tablet (10 mg total) by mouth daily.   SUPER B COMPLEX PO Take 1 tablet by mouth daily.   SYSTANE BALANCE OP Place 1 drop into both eyes 3 (three) times daily as needed (for dryness).   tamsulosin  0.4 MG Caps capsule Commonly known as: FLOMAX  Take 1 capsule (0.4 mg total) by mouth daily.   triamcinolone  cream 0.1 % Commonly known as: KENALOG  Apply topically 2 (two) times daily to affected area on leg for up to 2 weeks as needed for itching and flares.  Durable Medical Equipment  (From admission, onward)           Start     Ordered   04/02/24 1225  For home use only DME Walker rolling  Once       Question Answer Comment  Walker: With 5 Inch Wheels   Patient needs a walker to treat with the following condition Atrial fibrillation (HCC)      04/02/24 1225            Follow-up Information     [facility] Follow up.   Contact information: Address: [address], [place], [place] [address] Phone: [phone]        Marvene Prentice SAUNDERS, FNP Follow up in 1 week(s).   Specialty: Family Medicine Contact information: [address] [place] [address] [phone]                  Time coordinating  discharge: 45 min  Signed:  Harlene RAYMOND Bowl DO  [facility] 04/04/2024, 10:10 AM

## 2024-04-17 ENCOUNTER — Telehealth: Payer: Self-pay | Admitting: Pharmacy Technician

## 2024-04-17 NOTE — Telephone Encounter (Signed)
 Hi, I spoke to the daughter and we are wondering if he can be changed to xarelto ? He should qualify for [employer] assistance for the xarelto  (they do not require the 3% to be met like BMS is now requiring). If he can be changed, can someone please fill out the provider portion that is scanned in media under XARELTO  PROVIDER APPLICATION? Thank you!    Mailed application to patient 04/19/24 -I called the daughter and lmom about application    The daughter did say that if he can not be changed to xarelto , she will pay for the eliquis  and after paying the 600 one time he should be approved for BMS assistance.   She tried to call [facility] and she said they told her he would have to have part d insurance. He has 4 weeks left of eliquis .

## 2024-04-19 MED ORDER — APIXABAN 2.5 MG PO TABS: 2.5000 mg | ORAL_TABLET | Freq: Two times a day (BID) | ORAL | 0 refills | Status: DC

## 2024-04-19 NOTE — Telephone Encounter (Signed)
 Spoke with daughter.  Will give samples x 2 weeks (he has about 2 weeks of med at home).  MD to sign/fax on 04/22/24

## 2024-04-22 ENCOUNTER — Other Ambulatory Visit (HOSPITAL_COMMUNITY): Payer: Self-pay

## 2024-04-22 MED ORDER — NITROFURANTOIN MONOHYD MACRO 100 MG PO CAPS
100.0000 mg | ORAL_CAPSULE | Freq: Two times a day (BID) | ORAL | 0 refills | Status: AC
  Filled 2024-04-22: qty 20, 10d supply, fill #0

## 2024-04-22 NOTE — Telephone Encounter (Signed)
 Received patient portion - they wanted the medication to go to daughter's address so her address is on the sheet Received provider portion  Faxed application to [employer] 04/22/24  Patient picked up samples today. He still has eliquis  he will finish first.

## 2024-04-23 ENCOUNTER — Other Ambulatory Visit (HOSPITAL_COMMUNITY): Payer: Self-pay

## 2024-04-23 NOTE — Telephone Encounter (Signed)
" ° °  PAP: Patient assistance application for Xarelto  has been approved by PAP Companies: [employer] from 04/22/24 to 04/17/25. Medication should be delivered to PAP Delivery: Home. For further shipping updates, please contact Vicci ODESSIA Vicci ([employer]) at [phone]. Patient ID is: [email or web address]    I called daughter and made her aware. She will finish the eliquis  and then start the xarelto .  "

## 2024-04-24 ENCOUNTER — Telehealth: Payer: Self-pay | Admitting: Cardiovascular Disease

## 2024-04-24 ENCOUNTER — Other Ambulatory Visit (HOSPITAL_COMMUNITY): Payer: Self-pay

## 2024-04-24 NOTE — Telephone Encounter (Signed)
" °*  STAT* If patient is at the pharmacy, call can be transferred to refill team.   1. Which medications need to be refilled? (please list name of each medication and dose if known) amiodarone  (PACERONE ) 200 MG tablet   furosemide  (LASIX ) 40 MG tablet  metoprolol  succinate (TOPROL -XL) 50 MG 24 hr tablet    2. Would you like to learn more about the convenience, safety, & potential cost savings by using the [facility]? No     3. Are you open to using the [facility] (Type [facility].  ). No    4. Which pharmacy/location (including street and city if local pharmacy) is medication to be sent to? [facility]     5. Do they need a 30 day or 90 day supply? 30 day  Pt is out of medication   "

## 2024-04-25 ENCOUNTER — Other Ambulatory Visit (HOSPITAL_COMMUNITY): Payer: Self-pay

## 2024-04-26 ENCOUNTER — Other Ambulatory Visit: Payer: Self-pay

## 2024-04-26 ENCOUNTER — Other Ambulatory Visit (HOSPITAL_COMMUNITY): Payer: Self-pay

## 2024-04-26 MED ORDER — AMIODARONE HCL 200 MG PO TABS
200.0000 mg | ORAL_TABLET | Freq: Every day | ORAL | 0 refills | Status: DC
  Filled 2024-04-26 – 2024-05-03 (×2): qty 30, 30d supply, fill #0

## 2024-04-26 MED ORDER — FUROSEMIDE 40 MG PO TABS
40.0000 mg | ORAL_TABLET | Freq: Every day | ORAL | 0 refills | Status: DC
  Filled 2024-04-26 (×2): qty 30, 30d supply, fill #0

## 2024-04-26 MED ORDER — METOPROLOL SUCCINATE ER 50 MG PO TB24
50.0000 mg | ORAL_TABLET | Freq: Two times a day (BID) | ORAL | 0 refills | Status: DC
  Filled 2024-04-26: qty 60, 30d supply, fill #0

## 2024-04-26 NOTE — Telephone Encounter (Signed)
Refills has been sen to the pharmacy. 

## 2024-04-27 ENCOUNTER — Other Ambulatory Visit (HOSPITAL_COMMUNITY): Payer: Self-pay

## 2024-05-03 ENCOUNTER — Other Ambulatory Visit: Payer: Self-pay

## 2024-05-03 ENCOUNTER — Other Ambulatory Visit (HOSPITAL_COMMUNITY): Payer: Self-pay

## 2024-05-06 NOTE — Progress Notes (Unsigned)
 "   Cardiology Clinic Note   Patient Name: Richard Reed Date of Encounter: 05/08/2024  Primary Care Provider:  Marvene Prentice SAUNDERS, FNP Primary Cardiologist:  Dorn Lesches, MD  Patient Profile    Richard Reed 89 year old male presents to the clinic today for follow-up evaluation of his paroxysmal atrial fibrillation and CHF.  Past Medical History    Past Medical History:  Diagnosis Date   Angina    Aortic valve insufficiency, moderate    a. 02/2016 Echo: mild AS, mild to mod AI.   Arthritis    Arthritis pain    CAD (coronary artery disease)    a. 09/2010 Cath: RCA 40-60 ost, otw nl cors;  b. 03/2016 Lexiscan  MV: EF 46%, no ischemia, low risk.   Cancer [facility])    Carotid disease, bilateral    a. 02/2016 Carotid U/S: 1-39% bilat ICA plaquing.   Cerebral aneurysm    a. 02/2016 MRA: fusiform bilobed aneurysm @ R MCA bifurcation.   Cerebrovascular disease    a. 02/2016 MRA: long segment narrowing of basilar artery, moderate bilat posterior inferior cerebellar artery dzs.   Chronic combined systolic and diastolic CHF (congestive heart failure) (HCC)    a. 02/2016 Admitted to hospital in [place]  w/ CHF;  b. 02/2016 Echo ([facility]): EF 45-50%, diff HK, gr1DD, mild AS, mild to mod AI, mildly dil Ao root, PASP .   CKD (chronic kidney disease), stage III (HCC)    Dysrhythmia    Essential hypertension    Hard of hearing    Hemiparesis and alteration of sensations as late effects of stroke (HCC) 04/29/2015   Hiatal hernia    History of kidney stones    HOH (hard of hearing)    Hyperlipidemia    Kidney stones    Moderate aortic insufficiency    NICM (nonischemic cardiomyopathy) (HCC)    a. 02/2016 Echo: EF 45-50%, diff HK;  b. 03/2016 Lexiscan  MV: No ischemia, EF 46%.   NSVT (nonsustained ventricular tachycardia) (HCC)    a. 03/2016 noted on event monitor.   PAF (paroxysmal atrial fibrillation) (HCC)    a. 2012 - noted in setting of E coli bacteremia and septic shock.   Pneumonia  03/23/2011   first time   Prostate cancer (HCC)    Sepsis due to Escherichia coli (HCC) 03/24/11   Syncope    a. 02/2016 presumed to be 2/2 hypotension/dehydration and basilar artery dzs noted on MRA.   TIA (transient ischemic attack)    Vertebrobasilar insufficiency 04/28/2016   Visual disturbance 03/19/2014   Past Surgical History:  Procedure Laterality Date   CARDIAC CATHETERIZATION  09/2010   non obstructive CAD, 40-60% ostial RCA stenosis without dampening    CATARACT EXTRACTION Bilateral    cerebral angiography     CHOLECYSTECTOMY  04/15/2011   Procedure: LAPAROSCOPIC CHOLECYSTECTOMY WITH INTRAOPERATIVE CHOLANGIOGRAM;  Surgeon: Lynwood MALVA Kimble DOUGLAS, MD;  Location: [facility] OR;  Service: General;  Laterality: N/A;   CYSTOSCOPY WITH STENT PLACEMENT Bilateral 04/07/2022   Procedure: CYSTOSCOPY WITH BILATERAL STENT PLACEMENT;  Surgeon: Nieves Cough, MD;  Location: [facility] ORS;  Service: Urology;  Laterality: Bilateral;  30 MINS FRO CASE   CYSTOSCOPY/URETEROSCOPY/HOLMIUM LASER/STENT PLACEMENT Bilateral 05/03/2022   Procedure: CYSTOSCOPY BILATERAL URETEROSCOPY/HOLMIUM LASER/STENT PLACEMENT;  Surgeon: Nieves Cough, MD;  Location: [facility];  Service: Urology;  Laterality: Bilateral;  2 HRS FOR CASE   EXTRACORPOREAL SHOCK WAVE LITHOTRIPSY Left 12/12/2016   Procedure: LEFT EXTRACORPOREAL SHOCK WAVE LITHOTRIPSY (ESWL);  Surgeon: Cam Morene ORN, MD;  Location: [facility];  Service: Urology;  Laterality: Left;   IR ANGIO INTRA EXTRACRAN SEL COM CAROTID INNOMINATE BILAT MOD SED  03/21/2019   IR ANGIO VERTEBRAL SEL SUBCLAVIAN INNOMINATE BILAT MOD SED  03/21/2019   IR ANGIOGRAM SELECTIVE EACH ADDITIONAL VESSEL  04/20/2022   IR ANGIOGRAM SELECTIVE EACH ADDITIONAL VESSEL  04/20/2022   IR ANGIOGRAM VISCERAL SELECTIVE  04/20/2022   IR EMBO ART  VEN HEMORR LYMPH EXTRAV  INC GUIDE ROADMAPPING  04/20/2022   IR GENERIC HISTORICAL  03/17/2016   IR ANGIO VERTEBRAL SEL SUBCLAVIAN INNOMINATE BILAT MOD SED 03/17/2016 Thyra Nash, MD [facility]   IR GENERIC HISTORICAL  03/17/2016   IR ANGIO INTRA EXTRACRAN SEL COM CAROTID INNOMINATE BILAT MOD SED 03/17/2016 Thyra Nash, MD [facility]   IR US  GUIDE VASC ACCESS RIGHT  04/20/2022   PROSTATE SURGERY     approx 10 years ago, seed implant radiation   TONSILLECTOMY      Allergies  No Known Allergies  History of Present Illness    Richard Reed has a PMH of nonobstructive CAD with left heart cath 10/08/2010, paroxysmal atrial fibrillation, aortic insufficiency, LV dysfunction echocardiogram 6/21 showed an EF of 40-45% with global hypokinesis, G1 DD, severe LAE, mild-moderate MR, moderate AI.  His PMH also includes HTN, HLD, TIA, and CKD.  He is a longtime patient of Dr. Court.  He was seen in follow-up by Barnie Hila NP-C on 12/07/2022.  During that time his 2/24 6-day hospitalization for GI bleed was reviewed.  He had also been noted to have nephrolithiasis and underwent urethral stent placement.  He required 3 units of PRBCs.  He was started back on his low-dose Eliquis .  He continued to do well at [facility] independent living.  He was accompanied by his daughter.  He denied shortness of breath and dyspnea.  He did note that occasionally he did not feel like he was able to take a deep breath.  This would occur when he was at rest and was not bothersome to him.  He denied chest pain, pressure, and tightness.  He had no lower extremity swelling.  He denied orthopnea and PND.  He denied palpitations.  He was walking at the facility for exercise.  He was planning to have 2 teeth removed.  He denied bleeding issues.  CHA2DS2-VASc score 6 (hypertension, stroke x 2, vascular disease, age x 2).  His blood pressure was 110/76.  Follow-up was planned for 6 months.  He presented to the clinic 02/13/23 for follow-up evaluation and stated he had noticed increased dizziness over the last weeks two months.  He had been seen by his PCP who drew a BNP.  He  appeared to be euvolemic on exam.  Lung sounds were clear.  No lower extremity edema.  Weight had decreased.  Elevated BNP appeared to be related to age.  Baseline fluid volume status.    He is very hard of hearing.  We reviewed options for treatment.  I sent him to the vestibular rehab  and planned follow-up in 6 months.  He was admitted to the hospital on 03/29/2024 and discharged on 04/04/2024.  He was noted to have atrial fibrillation with RVR, decompensated CHF, VT requiring DCCV and elevated troponins.  At the time cardiology evaluation he was doing well.  He denied symptoms.  Palliative care consult along with goals of care discussion was planned.  He was euvolemic on exam.  He was continued on p.o. diuresis and loaded on  amiodarone .  His diltiazem  was discontinued.   He presents to the clinic today for follow-up evaluation and states he has done well since going back to [facility].  He presents with his daughter.  She reports that he is doing physical therapy and balance activities 3-4 times per week.  They have been monitoring his weight.  He has been compliant with his medications.  His blood pressure today is 119/68.  We reviewed his recent hospitalization.  They expressed understanding.  His daughter also reports that he had a recent urinary tract infection.  He finished antibiotics.  I will order a BMP and plan follow-up in 3 to 4 months.  Today he denies chest pain, shortness of breath, lower extremity edema, fatigue, palpitations, melena, hematuria, hemoptysis, diaphoresis, weakness, presyncope, syncope, orthopnea, and PND.    Home Medications    Prior to Admission medications   Medication Sig Start Date End Date Taking? Authorizing Provider  apixaban  (ELIQUIS ) 2.5 MG TABS tablet Take 1 tablet (2.5 mg total) by mouth 2 (two) times daily. 10/04/22   Court Dorn PARAS, MD  B Complex-C (SUPER B COMPLEX PO) Take 1 tablet by mouth daily.    [provider]  chlorhexidine   (PERIDEX ) 0.12 % solution GENTLY RINSE WITH 15 ML IN THE MOUTH FOR 2 MINUTES TWICE A DAY, EXPECTORATE AFTER USE. 12/15/22     diltiazem  (CARDIZEM  CD) 180 MG 24 hr capsule Take 1 capsule (180 mg total) by mouth daily. 01/31/23   Court Dorn PARAS, MD  feeding supplement, ENSURE ENLIVE, (ENSURE ENLIVE) LIQD Take 237 mLs by mouth 2 (two) times daily between meals. Patient taking differently: Take 237 mLs by mouth daily. 03/02/16   Juvenal Harlene PENNER, DO  finasteride  (PROSCAR ) 5 MG tablet Take 1 tablet (5 mg total) by mouth daily. 02/24/22     furosemide  (LASIX ) 20 MG tablet Take 1 tablet (20 mg total) by mouth daily. 07/04/22   Court Dorn PARAS, MD  metoprolol  tartrate (LOPRESSOR ) 25 MG tablet Take 1 tablet (25 mg total) by mouth 2 (two) times daily. 07/21/22   Court Dorn PARAS, MD  Multiple Vitamins-Minerals (PRESERVISION AREDS 2) CAPS Take 1 capsule by mouth in the morning and at bedtime.    [provider]  pantoprazole  (PROTONIX ) 40 MG tablet Take 1 tablet (40 mg total) by mouth 2 (two) times daily. Patient taking differently: Take 40 mg by mouth daily. 12/01/22     Propylene Glycol (SYSTANE BALANCE OP) Place 1 drop into both eyes 2 (two) times daily as needed (for dryness).    [provider]  rosuvastatin  (CRESTOR ) 10 MG tablet Take 1 tablet (10 mg total) by mouth daily. 01/10/23 01/10/24  Court Dorn PARAS, MD  tamsulosin  (FLOMAX ) 0.4 MG CAPS capsule Take 1 capsule (0.4 mg total) by mouth nightly at bedtime. 02/24/22     traMADol  (ULTRAM ) 50 MG tablet Take 1 tablet (50 mg total) by mouth every 4-6 hours as needed for pain 12/15/22       Family History    Family History  Problem Relation Age of Onset   Heart failure Mother    Heart disease Mother    Heart disease Father    Cancer Brother        prostate   He indicated that his mother is deceased. He indicated that his father is deceased. He indicated that his sister is alive. He indicated that two of his three brothers are alive.  He indicated that his maternal grandmother is deceased. He  indicated that his maternal grandfather is deceased. He indicated that his paternal grandmother is deceased. He indicated that his paternal grandfather is deceased. He indicated that both of his daughters are alive.  Social History    Social History   Socioeconomic History   Marital status: Married    Spouse name: Not on file   Number of children: 2   Years of education: HS   Highest education level: Not on file  Occupational History   Occupation: Retired  Tobacco Use   Smoking status: Never   Smokeless tobacco: Never  Vaping Use   Vaping status: Never Used  Substance and Sexual Activity   Alcohol  use: No   Drug use: No   Sexual activity: Yes  Other Topics Concern   Not on file  Social History Narrative   Medical Decision Maker:  LENNELL SHANKS (wife) Cell. [phone] House: [phone].   Daughter: Darice Sheffield: Cell [phone]   Full Code   Patient is Left handed.   Patient drinks 1 cup of caffeine daily.   Patient is left handed.   Lives at [facility]    Social Drivers of Health   Tobacco Use: Low Risk (05/08/2024)   Patient History    Smoking Tobacco Use: Never    Smokeless Tobacco Use: Never    Passive Exposure: Not on file  Financial Resource Strain: Not on file  Food Insecurity: No Food Insecurity (03/30/2024)   Epic    Worried About Programme Researcher, Broadcasting/film/video in the Last Year: Never true    Ran Out of Food in the Last Year: Never true  Transportation Needs: No Transportation Needs (03/30/2024)   Epic    Lack of Transportation (Medical): No    Lack of Transportation (Non-Medical): No  Physical Activity: Not on file  Stress: Not on file  Social Connections: Moderately Isolated (03/30/2024)   Social Connection and Isolation Panel    Frequency of Communication with Friends and Family: Once a week    Frequency of Social Gatherings with Friends and Family: Once a week    Attends Religious  Services: 1 to 4 times per year    Active Member of [employer] or Organizations: No    Attends Banker Meetings: Never    Marital Status: Married  Catering Manager Violence: Not At Risk (03/30/2024)   Epic    Fear of Current or Ex-Partner: No    Emotionally Abused: No    Physically Abused: No    Sexually Abused: No  Depression (PHQ2-9): Not on file  Alcohol  Screen: Not on file  Housing: Low Risk (03/30/2024)   Epic    Unable to Pay for Housing in the Last Year: No    Number of Times Moved in the Last Year: 0    Homeless in the Last Year: No  Utilities: Not At Risk (03/30/2024)   Epic    Threatened with loss of utilities: No  Health Literacy: Not on file     Review of Systems    General:  No chills, fever, night sweats or weight changes.  Cardiovascular:  No chest pain, dyspnea on exertion, edema, orthopnea, palpitations, paroxysmal nocturnal dyspnea. Dermatological: No rash, lesions/masses Respiratory: No cough, dyspnea Urologic: No hematuria, dysuria Abdominal:   No nausea, vomiting, diarrhea, bright red blood per rectum, melena, or hematemesis Neurologic:  No visual changes, wkns, changes in mental status. All other systems reviewed and are otherwise negative except as noted above.  Physical Exam    VS:  BP  119/68   Pulse 63   Ht 5' 9 (1.753 m)   Wt 132 lb (59.9 kg)   SpO2 97%   BMI 19.49 kg/m  , BMI Body mass index is 19.49 kg/m. GEN: Well nourished, well developed, in no acute distress. HEENT: normal.  Hard of hearing Neck: Supple, no JVD, carotid bruits, or masses. Cardiac: RRR, systolic murmur heard along right sternal border 3/6, rubs, or gallops. No clubbing, cyanosis, Generalized bilateral ankle edema.  Radials/DP/PT 2+ and equal bilaterally.  Respiratory:  Respirations regular and unlabored, clear to auscultation bilaterally. GI: Soft, nontender, nondistended, BS + x 4. MS: no deformity or atrophy. Skin: warm and dry, no rash. Neuro:  Strength  and sensation are intact. Psych: Normal affect.  Accessory Clinical Findings    Recent Labs: 03/29/2024: ALT 33; B Natriuretic Peptide 1,018.0; TSH 2.481 04/01/2024: Magnesium  1.9 04/03/2024: BUN 38; Creatinine, Ser 1.70; Hemoglobin 14.1; Platelets 102; Potassium 4.1; Sodium 138   Recent Lipid Panel    Component Value Date/Time   CHOL 97 (L) 05/22/2017 1056   TRIG 66 05/22/2017 1056   HDL 39 (L) 05/22/2017 1056   CHOLHDL 2.5 05/22/2017 1056   CHOLHDL 2.5 04/25/2016 0813   VLDL 13 04/25/2016 0813   LDLCALC 45 05/22/2017 1056         ECG personally reviewed by me today-none today.     Echocardiogram 09/30/2019  IMPRESSIONS     1. Left ventricular ejection fraction, by estimation, is 40 to 45%. The  left ventricle has mildly decreased function. The left ventricle  demonstrates global hypokinesis. The left ventricular internal cavity size  was moderately dilated. There is mild  left ventricular hypertrophy of the basal-septal segment. Left ventricular  diastolic parameters are consistent with Grade I diastolic dysfunction  (impaired relaxation). Elevated left ventricular end-diastolic pressure.   2. Right ventricular systolic function is normal. The right ventricular  size is normal. There is normal pulmonary artery systolic pressure.   3. Left atrial size was severely dilated.   4. The mitral valve is degenerative. Mild to moderate mitral valve  regurgitation. No evidence of mitral stenosis.   5. The aortic valve is tricuspid. Aortic valve regurgitation is moderate.  Moderate aortic valve stenosis. Aortic regurgitation PHT measures 410  msec. Aortic valve area, by VTI measures 1.23 cm. Aortic valve mean  gradient measures 22.0 mmHg. Aortic  valve Vmax measures 3.03 m/s.   6. Aortic dilatation noted. There is mild dilatation of the aortic root  and moderate dilatation of the ascending aorta measuring 41 mm and 46mm.   7. The inferior vena cava is normal in size with  greater than 50%  respiratory variability, suggesting right atrial pressure of 3 mmHg.   Comparison(s): 07/16/19 EF 35-40%. Mild AS mean PG, peak PG.  Compared to prior echo 3/21, LVF appears improved. AS appears moderate  with mean AVG and AVA 1.23cm2 with peak AV velociy 33m/s. Aortic  insusficiency is moderate and  eccentrically directed towards the anterior mitral valve leaflet.   FINDINGS   Left Ventricle: Left ventricular ejection fraction, by estimation, is 40  to 45%. The left ventricle has mildly decreased function. The left  ventricle demonstrates global hypokinesis. The left ventricular internal  cavity size was moderately dilated.  There is mild left ventricular hypertrophy of the basal-septal segment.  Left ventricular diastolic parameters are consistent with Grade I  diastolic dysfunction (impaired relaxation). Elevated left ventricular  end-diastolic pressure.   Right Ventricle: The right ventricular size  is normal. No increase in  right ventricular wall thickness. Right ventricular systolic function is  normal. There is normal pulmonary artery systolic pressure. The tricuspid  regurgitant velocity is 2.65 m/s, and   with an assumed right atrial pressure of 3 mmHg, the estimated right  ventricular systolic pressure is 31.1 mmHg.   Left Atrium: Left atrial size was severely dilated.   Right Atrium: Right atrial size was normal in size.   Pericardium: There is no evidence of pericardial effusion.   Mitral Valve: The mitral valve is degenerative in appearance. There is  moderate thickening of the mitral valve leaflet(s). There is mild  calcification of the mitral valve leaflet(s). Normal mobility of the  mitral valve leaflets. Moderate mitral annular  calcification. Mild to moderate mitral valve regurgitation. No evidence of  mitral valve stenosis.   Tricuspid Valve: The tricuspid valve is normal in structure. Tricuspid  valve regurgitation is  mild . No evidence of tricuspid stenosis.   Aortic Valve: The aortic valve is tricuspid. . There is moderate  thickening and moderate calcification of the aortic valve. Aortic valve  regurgitation is moderate. Aortic regurgitation PHT measures 410 msec.  Moderate aortic stenosis is present. Moderate  aortic valve annular calcification. There is moderate thickening of the  aortic valve. There is moderate calcification of the aortic valve. Aortic  valve mean gradient measures 22.0 mmHg. Aortic valve peak gradient  measures 36.7 mmHg. Aortic valve area, by   VTI measures 1.23 cm.   Pulmonic Valve: The pulmonic valve was normal in structure. Pulmonic valve  regurgitation is not visualized. No evidence of pulmonic stenosis.   Aorta: Aortic dilatation noted. There is mild dilatation of the aortic  root and of the ascending aorta measuring 41 mm.   Venous: The inferior vena cava is normal in size with greater than 50%  respiratory variability, suggesting right atrial pressure of 3 mmHg.   IAS/Shunts: The interatrial septum appears to be lipomatous. No atrial  level shunt detected by color flow Doppler.    Nuclear stress testing 03/30/2016  The left ventricular ejection fraction is mildly decreased (45-54%). Nuclear stress EF: 46%. There was no ST segment deviation noted during stress. This is a low risk study.   Normal perfusion EF calculated at 46% but no discrete RWMA;s May be due to frequent ventricular ectopy   Echocardiogram 03/30/2024  IMPRESSIONS     1. Global hypokinesis with akinesis of the inferolateral wall; overall  severe LV dysfunction.   2. Left ventricular ejection fraction, by estimation, is 25 to 30%. The  left ventricle has severely decreased function. The left ventricle  demonstrates regional wall motion abnormalities (see scoring  diagram/findings for description). Left ventricular  diastolic parameters are consistent with Grade II diastolic dysfunction   (pseudonormalization). Elevated left atrial pressure.   3. Right ventricular systolic function is normal. The right ventricular  size is normal. There is mildly elevated pulmonary artery systolic  pressure.   4. Left atrial size was moderately dilated.   5. Right atrial size was moderately dilated.   6. The mitral valve is normal in structure. Mild mitral valve  regurgitation. No evidence of mitral stenosis.   7. The aortic valve is tricuspid. Aortic valve regurgitation is mild.  Severe aortic valve stenosis.   8. There is moderate dilatation of the ascending aorta, measuring 47 mm.   9. The inferior vena cava is normal in size with greater than 50%  respiratory variability, suggesting right atrial pressure of 3 mmHg.  Conclusion(s)/Recommendation(s): No evidence of valvular vegetations on  this transthoracic echocardiogram. Consider a transesophageal  echocardiogram to exclude infective endocarditis if clinically indicated.   FINDINGS   Left Ventricle: Left ventricular ejection fraction, by estimation, is 25  to 30%. The left ventricle has severely decreased function. The left  ventricle demonstrates regional wall motion abnormalities. The left  ventricular internal cavity size was normal   in size. There is no left ventricular hypertrophy. Left ventricular  diastolic parameters are consistent with Grade II diastolic dysfunction  (pseudonormalization). Elevated left atrial pressure.   Right Ventricle: The right ventricular size is normal. Right ventricular  systolic function is normal. There is mildly elevated pulmonary artery  systolic pressure. The tricuspid regurgitant velocity is 2.99 m/s, and  with an assumed right atrial pressure  of 3 mmHg, the estimated right ventricular systolic pressure is 38.8 mmHg.   Left Atrium: Left atrial size was moderately dilated.   Right Atrium: Right atrial size was moderately dilated.   Pericardium: There is no evidence of pericardial  effusion.   Mitral Valve: The mitral valve is normal in structure. Mild mitral annular  calcification. Mild mitral valve regurgitation. No evidence of mitral  valve stenosis. MV peak gradient, 7.6 mmHg. The mean mitral valve gradient  is 3.0 mmHg.   Tricuspid Valve: The tricuspid valve is normal in structure. Tricuspid  valve regurgitation is mild . No evidence of tricuspid stenosis.   Aortic Valve: The aortic valve is tricuspid. Aortic valve regurgitation is  mild. Aortic regurgitation PHT measures 582 msec. Severe aortic stenosis  is present. Aortic valve mean gradient measures 19.0 mmHg. Aortic valve  peak gradient measures 29.5 mmHg.  Aortic valve area, by VTI measures 0.91 cm.   Pulmonic Valve: The pulmonic valve was normal in structure. Pulmonic valve  regurgitation is trivial. No evidence of pulmonic stenosis.   Aorta: The aortic root is normal in size and structure. There is moderate  dilatation of the ascending aorta, measuring 47 mm.   Venous: The inferior vena cava is normal in size with greater than 50%  respiratory variability, suggesting right atrial pressure of 3 mmHg.   IAS/Shunts: No atrial level shunt detected by color flow Doppler.    Assessment & Plan   1.  Acute systolic CHF-echocardiogram 03/30/2024 showed an LVEF of 25-30% and grade 2 diastolic dysfunction.  He required DCCV in the setting of VT.  Cardiology was consulted.  It appears that his reduced EF is in the setting of sustained VT and atrial fibrillation with RVR.  He was noted to have elevated cardiac troponins.  Options for treatment were discussed.  Conservative management and palliative care were felt to be the best course of treatment.  Generalized bilateral ankle edema. Heart healthy low-sodium diet Continue amiodarone , furosemide , metoprolol  Order BMP  Coronary artery disease-denies chest pain today.  Cardiac catheterization 6/12 showed ostial RCA with 46% stenosis.  EF noted to be 25-30% on  03/30/2024.  No plans for ischemic evaluation due to conservative management and palliative care. Continue current medical therapy Heart healthy low-sodium diet Maintain physical activity  Atrial fibrillation-heart rate today 63 bpm.  He required DCCV in the setting of VT and was also noted to have atrial fibrillation with RVR during recent admission.  His diltiazem  was discontinued and he was placed on amiodarone .  Reports compliance with apixaban .  Denies bleeding issues. Continue apixaban , amiodarone , metoprolol  Maintain p.o. hydration Avoid triggers caffeine, chocolate, EtOH, dehydration excetra.  Essential hypertension-BP today 119/68 Continue metoprolol , Maintain blood  pressure log   Disposition: Follow-up with Dr.Berry or me in 3-4 months.   Josefa HERO. Daxten Kovalenko NP-C     05/08/2024, 10:26 AM [facility] [address] Office [phone] Fax [phone]    I spent 14 minutes examining this patient, reviewing medications, and using patient centered shared decision making involving her cardiac care.   I spent greater than 20 minutes reviewing her past medical history,  medications, and prior cardiac tests.  "

## 2024-05-08 ENCOUNTER — Ambulatory Visit: Attending: Cardiology | Admitting: General Practice

## 2024-05-08 ENCOUNTER — Encounter: Payer: Self-pay | Admitting: General Practice

## 2024-05-08 VITALS — BP 119/68 | HR 63 | Ht 69.0 in | Wt 132.0 lb

## 2024-05-08 DIAGNOSIS — I10 Essential (primary) hypertension: Secondary | ICD-10-CM

## 2024-05-08 DIAGNOSIS — I480 Paroxysmal atrial fibrillation: Secondary | ICD-10-CM

## 2024-05-08 DIAGNOSIS — I5043 Acute on chronic combined systolic (congestive) and diastolic (congestive) heart failure: Secondary | ICD-10-CM | POA: Diagnosis not present

## 2024-05-08 DIAGNOSIS — I2510 Atherosclerotic heart disease of native coronary artery without angina pectoris: Secondary | ICD-10-CM | POA: Diagnosis not present

## 2024-05-08 DIAGNOSIS — I2583 Coronary atherosclerosis due to lipid rich plaque: Secondary | ICD-10-CM | POA: Diagnosis not present

## 2024-05-08 NOTE — Patient Instructions (Addendum)
 Medication Instructions:   Your physician recommends that you continue on your current medications as directed. Please refer to the Current Medication list given to you today.   *If you need a refill on your cardiac medications before your next appointment, please call your pharmacy*   Lab Work:  PLEASE GO DOWN STAIRS  [facility]  FIRST FLOOR   ( GET OFF ELEVATORS WALK TOWARDS WAITING AREA LAB LOCATED BY PHARMACY):  BMET  TODAY       If you have labs (blood work) drawn today and your tests are completely normal, you will receive your results only by: MyChart Message (if you have MyChart) OR A paper copy in the mail If you have any lab test that is abnormal or we need to change your treatment, we will call you to review the results.   Testing/Procedures: NONE ORDERED  TODAY    Follow-Up: At [facility], you and your health needs are our priority.  As part of our continuing mission to provide you with exceptional heart care, our providers are all part of one team.  This team includes your primary Cardiologist (physician) and Advanced Practice Providers or APPs (Physician Assistants and Nurse Practitioners) who all work together to provide you with the care you need, when you need it.   Your next appointment:   3 -4 month(s)   Provider:   Dorn Lesches, MD/ [place]     We recommend signing up for the patient portal called MyChart.  Sign up information is provided on this After Visit Summary.  MyChart is used to connect with patients for Virtual Visits (Telemedicine).  Patients are able to view lab/test results, encounter notes, upcoming appointments, etc.  Non-urgent messages can be sent to your provider as well.   To learn more about what you can do with MyChart, go to [email or web address].   Other Instructions

## 2024-05-09 ENCOUNTER — Ambulatory Visit: Payer: Self-pay | Admitting: General Practice

## 2024-05-09 LAB — BASIC METABOLIC PANEL WITH GFR
BUN/Creatinine Ratio: 21 (ref 10–24)
BUN: 37 mg/dL — ABNORMAL HIGH (ref 10–36)
CO2: 23 mmol/L (ref 20–29)
Calcium: 8.9 mg/dL (ref 8.6–10.2)
Chloride: 106 mmol/L (ref 96–106)
Creatinine, Ser: 1.78 mg/dL — ABNORMAL HIGH (ref 0.76–1.27)
Glucose: 98 mg/dL (ref 70–99)
Potassium: 4.1 mmol/L (ref 3.5–5.2)
Sodium: 146 mmol/L — ABNORMAL HIGH (ref 134–144)
eGFR: 34 mL/min/1.73 — ABNORMAL LOW

## 2024-05-20 ENCOUNTER — Other Ambulatory Visit: Payer: Self-pay | Admitting: Cardiovascular Disease

## 2024-05-20 ENCOUNTER — Other Ambulatory Visit (HOSPITAL_COMMUNITY): Payer: Self-pay

## 2024-05-20 DIAGNOSIS — I5043 Acute on chronic combined systolic (congestive) and diastolic (congestive) heart failure: Secondary | ICD-10-CM

## 2024-05-20 DIAGNOSIS — I480 Paroxysmal atrial fibrillation: Secondary | ICD-10-CM

## 2024-05-20 MED ORDER — AMIODARONE HCL 200 MG PO TABS
200.0000 mg | ORAL_TABLET | Freq: Every day | ORAL | 0 refills | Status: AC
  Filled 2024-05-20: qty 90, 90d supply, fill #0

## 2024-05-20 MED ORDER — METOPROLOL SUCCINATE ER 50 MG PO TB24
50.0000 mg | ORAL_TABLET | Freq: Two times a day (BID) | ORAL | 0 refills | Status: AC
  Filled 2024-05-20: qty 180, 90d supply, fill #0

## 2024-05-20 MED ORDER — FUROSEMIDE 40 MG PO TABS
40.0000 mg | ORAL_TABLET | Freq: Every day | ORAL | 0 refills | Status: AC
  Filled 2024-05-20: qty 90, 90d supply, fill #0

## 2024-05-23 ENCOUNTER — Other Ambulatory Visit (HOSPITAL_COMMUNITY): Payer: Self-pay

## 2024-08-06 ENCOUNTER — Ambulatory Visit: Admitting: Cardiovascular Disease
